# Patient Record
Sex: Male | Born: 1940 | Race: White | Hispanic: No | Smoking: Never smoker
Health system: Southern US, Community
[De-identification: ages and names within clinical notes are randomized; demographics above are authoritative.]

## PROBLEM LIST (undated history)

## (undated) DIAGNOSIS — Z789 Other specified health status: Secondary | ICD-10-CM

## (undated) DIAGNOSIS — K56609 Unspecified intestinal obstruction, unspecified as to partial versus complete obstruction: Secondary | ICD-10-CM

## (undated) DIAGNOSIS — IMO0002 Reserved for concepts with insufficient information to code with codable children: Secondary | ICD-10-CM

## (undated) DIAGNOSIS — G2581 Restless legs syndrome: Secondary | ICD-10-CM

## (undated) DIAGNOSIS — N179 Acute kidney failure, unspecified: Secondary | ICD-10-CM

## (undated) DIAGNOSIS — I480 Paroxysmal atrial fibrillation: Secondary | ICD-10-CM

## (undated) DIAGNOSIS — M4310 Spondylolisthesis, site unspecified: Secondary | ICD-10-CM

## (undated) DIAGNOSIS — Z9289 Personal history of other medical treatment: Secondary | ICD-10-CM

## (undated) DIAGNOSIS — E119 Type 2 diabetes mellitus without complications: Secondary | ICD-10-CM

## (undated) DIAGNOSIS — Z8673 Personal history of transient ischemic attack (TIA), and cerebral infarction without residual deficits: Secondary | ICD-10-CM

## (undated) DIAGNOSIS — R109 Unspecified abdominal pain: Secondary | ICD-10-CM

## (undated) DIAGNOSIS — N182 Chronic kidney disease, stage 2 (mild): Secondary | ICD-10-CM

## (undated) DIAGNOSIS — I10 Essential (primary) hypertension: Secondary | ICD-10-CM

## (undated) DIAGNOSIS — Z7901 Long term (current) use of anticoagulants: Secondary | ICD-10-CM

## (undated) DIAGNOSIS — M793 Panniculitis, unspecified: Secondary | ICD-10-CM

## (undated) DIAGNOSIS — I2510 Atherosclerotic heart disease of native coronary artery without angina pectoris: Secondary | ICD-10-CM

## (undated) DIAGNOSIS — E785 Hyperlipidemia, unspecified: Secondary | ICD-10-CM

## (undated) HISTORY — PX: SPINAL FUSION: SHX223

## (undated) HISTORY — DX: Unspecified intestinal obstruction, unspecified as to partial versus complete obstruction: K56.609

## (undated) HISTORY — DX: Reserved for concepts with insufficient information to code with codable children: IMO0002

## (undated) HISTORY — DX: Panniculitis, unspecified: M79.3

## (undated) HISTORY — PX: PROSTATE SURGERY: SHX751

## (undated) HISTORY — DX: Acute kidney failure, unspecified: N17.9

## (undated) HISTORY — DX: Atherosclerotic heart disease of native coronary artery without angina pectoris: I25.10

## (undated) HISTORY — DX: Hyperlipidemia, unspecified: E78.5

## (undated) HISTORY — DX: Spondylolisthesis, site unspecified: M43.10

## (undated) HISTORY — DX: Restless legs syndrome: G25.81

## (undated) HISTORY — DX: Essential (primary) hypertension: I10

## (undated) HISTORY — DX: Type 2 diabetes mellitus without complications: E11.9

---

## 2002-07-19 HISTORY — PX: CORONARY STENT PLACEMENT: SHX1402

## 2003-05-11 ENCOUNTER — Encounter: Payer: Self-pay | Admitting: Orthopedic Surgery

## 2003-05-11 ENCOUNTER — Inpatient Hospital Stay (HOSPITAL_COMMUNITY): Admission: EM | Admit: 2003-05-11 | Discharge: 2003-05-15 | Payer: Self-pay | Admitting: *Deleted

## 2004-08-28 ENCOUNTER — Ambulatory Visit: Payer: Self-pay | Admitting: *Deleted

## 2005-04-02 ENCOUNTER — Ambulatory Visit: Payer: Self-pay | Admitting: Cardiology

## 2005-09-16 ENCOUNTER — Ambulatory Visit: Payer: Self-pay | Admitting: Cardiology

## 2005-12-09 ENCOUNTER — Ambulatory Visit: Payer: Self-pay | Admitting: Cardiology

## 2006-01-11 ENCOUNTER — Encounter: Admission: RE | Admit: 2006-01-11 | Discharge: 2006-01-11 | Payer: Self-pay | Admitting: Orthopaedic Surgery

## 2006-02-11 ENCOUNTER — Ambulatory Visit: Payer: Self-pay | Admitting: Cardiology

## 2006-02-14 ENCOUNTER — Ambulatory Visit: Payer: Self-pay

## 2006-02-24 ENCOUNTER — Inpatient Hospital Stay (HOSPITAL_COMMUNITY): Admission: RE | Admit: 2006-02-24 | Discharge: 2006-02-28 | Payer: Self-pay | Admitting: Orthopaedic Surgery

## 2006-04-15 ENCOUNTER — Ambulatory Visit: Payer: Self-pay | Admitting: Cardiology

## 2006-06-21 ENCOUNTER — Ambulatory Visit: Payer: Self-pay | Admitting: Cardiology

## 2006-09-01 ENCOUNTER — Ambulatory Visit: Payer: Self-pay | Admitting: Cardiology

## 2006-09-01 LAB — CONVERTED CEMR LAB
ALT: 25 units/L (ref 0–40)
AST: 23 units/L (ref 0–37)
Alkaline Phosphatase: 58 units/L (ref 39–117)
Bilirubin, Direct: 0.2 mg/dL (ref 0.0–0.3)
HDL: 29.9 mg/dL — ABNORMAL LOW (ref >39.0)
LDL Cholesterol: 51 mg/dL (ref 0–99)
VLDL: 27 mg/dL (ref 0–40)

## 2006-10-10 ENCOUNTER — Ambulatory Visit: Payer: Self-pay | Admitting: Cardiology

## 2006-10-21 ENCOUNTER — Ambulatory Visit (HOSPITAL_BASED_OUTPATIENT_CLINIC_OR_DEPARTMENT_OTHER): Admission: RE | Admit: 2006-10-21 | Discharge: 2006-10-21 | Payer: Self-pay | Admitting: Urology

## 2007-03-27 ENCOUNTER — Ambulatory Visit: Payer: Self-pay | Admitting: Cardiology

## 2007-04-18 ENCOUNTER — Ambulatory Visit: Payer: Self-pay | Admitting: Cardiology

## 2007-04-18 LAB — CONVERTED CEMR LAB
ALT: 24 units/L (ref 0–53)
Albumin: 4.3 g/dL (ref 3.5–5.2)
Alkaline Phosphatase: 57 units/L (ref 39–117)
Total CHOL/HDL Ratio: 6.3
Triglycerides: 265 mg/dL (ref 0–149)
VLDL: 53 mg/dL — ABNORMAL HIGH (ref 0–40)

## 2007-09-28 ENCOUNTER — Ambulatory Visit: Payer: Self-pay | Admitting: Cardiology

## 2007-09-28 LAB — CONVERTED CEMR LAB
ALT: 26 units/L (ref 0–53)
Bilirubin, Direct: 0.3 mg/dL (ref 0.0–0.3)
Cholesterol: 161 mg/dL (ref 0–200)
HDL: 32.5 mg/dL — ABNORMAL LOW (ref >39.0)
LDL Cholesterol: 97 mg/dL (ref 0–99)
Total CHOL/HDL Ratio: 5
Total Protein: 7.2 g/dL (ref 6.0–8.3)
Triglycerides: 157 mg/dL — ABNORMAL HIGH (ref 0–149)
VLDL: 31 mg/dL (ref 0–40)

## 2007-10-03 ENCOUNTER — Ambulatory Visit: Payer: Self-pay

## 2009-09-08 DIAGNOSIS — Z9861 Coronary angioplasty status: Secondary | ICD-10-CM

## 2009-09-08 DIAGNOSIS — E1159 Type 2 diabetes mellitus with other circulatory complications: Secondary | ICD-10-CM | POA: Insufficient documentation

## 2009-09-08 DIAGNOSIS — I2510 Atherosclerotic heart disease of native coronary artery without angina pectoris: Secondary | ICD-10-CM | POA: Insufficient documentation

## 2009-09-09 ENCOUNTER — Ambulatory Visit: Payer: Self-pay | Admitting: Cardiology

## 2009-09-09 DIAGNOSIS — I739 Peripheral vascular disease, unspecified: Secondary | ICD-10-CM | POA: Insufficient documentation

## 2009-09-09 DIAGNOSIS — E7800 Pure hypercholesterolemia, unspecified: Secondary | ICD-10-CM | POA: Insufficient documentation

## 2009-09-09 DIAGNOSIS — I10 Essential (primary) hypertension: Secondary | ICD-10-CM | POA: Insufficient documentation

## 2009-09-26 ENCOUNTER — Ambulatory Visit: Payer: Self-pay | Admitting: Cardiology

## 2009-09-26 ENCOUNTER — Encounter: Payer: Self-pay | Admitting: Cardiology

## 2009-09-26 ENCOUNTER — Ambulatory Visit (HOSPITAL_COMMUNITY): Admission: RE | Admit: 2009-09-26 | Discharge: 2009-09-26 | Payer: Self-pay | Admitting: Cardiology

## 2009-09-26 ENCOUNTER — Ambulatory Visit: Payer: Self-pay

## 2009-10-07 ENCOUNTER — Ambulatory Visit: Payer: Self-pay | Admitting: Cardiology

## 2009-10-09 ENCOUNTER — Telehealth: Payer: Self-pay | Admitting: Cardiology

## 2009-10-21 ENCOUNTER — Encounter: Payer: Self-pay | Admitting: Cardiology

## 2009-10-22 ENCOUNTER — Telehealth: Payer: Self-pay | Admitting: Cardiology

## 2009-10-31 ENCOUNTER — Telehealth: Payer: Self-pay | Admitting: Cardiology

## 2010-02-02 DIAGNOSIS — E860 Dehydration: Secondary | ICD-10-CM | POA: Insufficient documentation

## 2010-04-13 ENCOUNTER — Ambulatory Visit: Payer: Self-pay | Admitting: Cardiology

## 2010-04-14 ENCOUNTER — Encounter: Payer: Self-pay | Admitting: Cardiology

## 2010-04-27 ENCOUNTER — Telehealth: Payer: Self-pay | Admitting: Cardiology

## 2010-05-11 ENCOUNTER — Telehealth: Payer: Self-pay | Admitting: Cardiology

## 2010-08-16 LAB — CONVERTED CEMR LAB
AST: 18 units/L (ref 0–37)
Albumin: 4.5 g/dL (ref 3.5–5.2)
Alkaline Phosphatase: 47 units/L (ref 39–117)
BUN: 17 mg/dL (ref 6–23)
Calcium: 9.4 mg/dL (ref 8.4–10.5)
GFR calc non Af Amer: 78.29 mL/min (ref >60)
Glucose, Bld: 118 mg/dL — ABNORMAL HIGH (ref 70–99)
HDL: 40.4 mg/dL (ref >39.00)
LDL Cholesterol: 90 mg/dL (ref 0–99)
Sodium: 142 meq/L (ref 135–145)
Total Bilirubin: 1.2 mg/dL (ref 0.3–1.2)
VLDL: 22 mg/dL (ref 0.0–40.0)

## 2010-08-18 NOTE — Progress Notes (Signed)
Summary: B/P readings-10-31-09 for Dr Shirlee Latch to review   Phone Note Outgoing Call   Call placed by: Katina Dung, RN, BSN,  October 31, 2009 9:33 AM Call placed to: Patient Summary of Call: check on B/P readings   Follow-up for Phone Call        LM for pt to call me to get recent B/P readings talked with patient--recent B/P readings  10-23-09 142/74   glucose148 10-24-09 150/71  glucose 138 10-25-09 124/67    glucose136 10-26-09 128/70   glucose 178 10-27-09 133/71   glucose 150 10-28-09 135/73   glucose 145 10-29-09 135/74   glucose 128 pt states he has not taken any Chlorthalidone(it made him sick after eating pizza,so he stopped it) or KCL since around first of April--he also is not taking Niapson,but will get OTC Niacin to try--will forward to Dr Shirlee Latch for review       Appended Document: B/P readings-10-31-09 for Dr Shirlee Latch to review BP actually looks ok.   Appended Document: B/P readings-10-31-09 for Dr Shirlee Latch to review LMVM for pt to call me  Appended Document: B/P readings-10-31-09 for Dr Shirlee Latch to review talked with patient by telephone

## 2010-08-18 NOTE — Progress Notes (Signed)
Summary: B/P readings   Phone Note Outgoing Call   Call placed by: Katina Dung, RN, BSN,  October 22, 2009 3:37 PM Call placed to: Patient Summary of Call: get B/P readings  Follow-up for Phone Call        [facility] for pt to call me--I need to  get B/P readings Chlorthalidone 12.5mg  and KCL started 10-07-09/BMP  10-21-09  Additional Follow-up for Phone Call Additional follow up Details #1::        3/23 153/80 3/24 174/82 3/25 172/81  3/26 151/81 3/27 172/83 3/28 154/82 3/29 133/72 3/30 141/74 3/31 150/70 4/1 143/79 4/2 143/79 4/3 141/71 4/4 143/71 4/5 137/71  3 days after starting meds blood sugar went up in the am 138-160 normally around 110s  [facility]  October 22, 2009 4:00 PM      Appended Document: B/P readings Gradually improving. . Would monitor for another 2 wks and check in with him again.   Appended Document: B/P readings discussed with patient--he is concerned about increased glucose since starting chlorthalidone--he will continue to monitor B/P and glucose and I will follow-up with him in 10-14 days

## 2010-08-18 NOTE — Assessment & Plan Note (Signed)
Summary: f72m/ gd  Medications Added ASPIRIN 81 MG TBEC (ASPIRIN) two tablets daily PRILOSEC OTC 20 MG TBEC (OMEPRAZOLE MAGNESIUM) 1 tab once daily TRAMADOL HCL 50 MG TABS (TRAMADOL HCL) one to two twice a day as needed for  pain GLUCOSAMINE-CHONDROITIN  CAPS (GLUCOSAMINE-CHONDROIT-VIT C-MN) 1 cap once daily NITROSTAT 0.4 MG SUBL (NITROGLYCERIN) 1 tablet under tongue at onset of chest pain; you may repeat every 5 minutes for up to 3 doses.      Allergies Added: NKDA  Visit Type:  6 MO F/U Primary Provider:  [name], PA  CC:  pt c/o heartburn qhs...says he has started to take prilosec qd .[name]..no other complaints today.  History of Present Illness: 70 yo with history of CAD s/p anterior MI in 2004 and LAD/RCA PCIs presents for cardiology followup.   Patient has been doing well.  No chest pain.  He works in his garden and weed-eats without shortness of breath.  He had been having some problems with "indigestion" (was getting burning in his epigastrium and periumbilical area when lying in bed at night).  After starting Prilosec over the counter, this seems to have mostly resolved.  He is taking Ibuprofen for aches/pains in his joints.  He had side effects with chlorthalidone so is no longer taking it.  He never started niacin.   Labs (3/11): LDL 90, HDL 40, K 4.4, creatinine 1.0 Labs (4/11): K 5.1, creatinine 1.4  ECG: NSR, 1st degree AV block, old anterior MI  Current Medications (verified): 1)  Metoprolol Tartrate 50 Mg Tabs (Metoprolol Tartrate) .... Take One Tablet Two Times A Day 2)  Lisinopril 40 Mg Tabs (Lisinopril) .... One Tablet Daily 3)  Glyburide-Metformin 5-500 Mg Tabs (Glyburide-Metformin) .... Take 2 Tablets Two Times A Day 4)  Lovastatin 20 Mg Tabs (Lovastatin) .... Take One Tablet Once Daily 5)  Bayer Aspirin 325 Mg Tabs (Aspirin) .... Take One Tablet Once Daily 6)  Allegra 180 Mg Tabs (Fexofenadine Hcl) .... Once Daily 7)  Prilosec Otc 20 Mg Tbec (Omeprazole  Magnesium) .[name].. 1 Tab Once Daily 8)  Ibuprofen 200 Mg Tabs (Ibuprofen) .[name].. 1 Tab Two Times A Day 9)  Glucosamine-Chondroitin  Caps (Glucosamine-Chondroit-Vit C-Mn) .[name].. 1 Cap Once Daily 10)  Nitrostat 0.4 Mg Subl (Nitroglycerin) .[name].. 1 Tablet Under Tongue At Onset of Chest Pain; You May Repeat Every 5 Minutes For Up To 3 Doses.  Allergies (verified): No Known Drug Allergies  Past History:  Past Medical History: 1. 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  s/p back surgery.  2. Diabetes mellitus 3. Hypertension: had side effects with chlorthalidone.  4. Hyperlipidemia: intolerant to multiple statins due to muscle pain.  He is able to tolerate lovastatin.  5. CAD: Anterior MI in 2004.  Patient had LAD and RCA PCI at the time.  Last myoview was in 7/07: EF 62%, small area of anteroapical infarct.  Echo (3/11): EF 60%, normal wall motion, mild MR, mild left atrial enlargement, mildly dilated ascending aorta.   6.  Arterial dopplers (3/11): no evidence for significant PAD.  7.  Allergic rhinitis  Family History: Reviewed history from 09/08/2009 and no changes required. Noncontributory  Social History: Reviewed history from 09/09/2009 and no changes required. 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  Tobacco Use - No.  Alcohol Use - no Drug Use - no  Review of Systems       All systems reviewed and negative except as per  HPI.   Vital Signs:  Patient profile:   70 year old male Height:      74 inches Weight:      224.12 pounds BMI:     28.88 Pulse rate:   53 / minute Pulse rhythm:   irregular BP sitting:   146 / 90  (left arm) Cuff size:   large  Vitals Entered By: Danielle Rankin, CMA (April 13, 2010 8:52 AM)  Physical Exam  General:  Well developed, well nourished, in no acute distress. Neck:  Neck supple, no JVD. No masses, thyromegaly or abnormal cervical nodes. Lungs:  Clear bilaterally to  auscultation and percussion. Heart:  Non-displaced PMI, chest non-tender; regular rate and rhythm, S1, S2 without murmurs, rubs. +S4. Carotid upstroke normal, no bruit.  Normal pedal pulses. No edema, no varicosities. Abdomen:  Bowel sounds positive; abdomen soft and non-tender without masses, organomegaly, or hernias noted. No hepatosplenomegaly. Extremities:  No clubbing or cyanosis. Neurologic:  Alert and oriented x 3. Psych:  Normal affect.   Impression & Recommendations:  Problem # 1:  CAD (ICD-414.00) Stable with no chest pain or significant dyspnea.  Most recent myoview in 7/07 showed a small area of anteroapical infarct with no ischemia.  Continue ASA, metoprolol, lisinopril, lovastatin.  EF is preserved.  OK to decrease ASA to 162 mg daily.   Problem # 2:  PURE HYPERCHOLESTEROLEMIA (ICD-272.0) I will have patient get lipids/LFTs checked.  Goal LDL < 70.  If above goal, would see if he can tolerate increasing lovastatin to 40 mg daily.   Problem # 3:  EPIGASTRIC SYMPTOMS Mostly resolved with Prilosec.  Would continue the Prilosec.  Given HTN, epigastric symptoms (? PUD), and CAD, would prefer that he not take ibuprofen.  I will have him try tramadol 50-100 mg two times a day as needed pain instead of ibuprofen.    Problem # 4:  UNSPECIFIED ESSENTIAL HYPERTENSION (ICD-401.9) BP high today, has been ok at home.  Will have him check BP for 2 wks and we will call to see what it is running.   Patient Instructions: 1)  Your physician has recommended you make the following change in your medication:  2)  Decrease Aspirin to 162mg  daily--this will be two 81mg  tablets daiy. 3)  Stop Ibuprofen. 4)  Use Tramadol 50mg  one to two twice a day as needed for pain instead of Ibuprofen. 5)  Take and record  your blood pressure--I will call you in 2 weeks to get the readings.  Check your blood pressure about 2 hours after you take your medication. 6)  Your physician recommends that you return for a  FASTING lipid profile/liver profile/BMP/CBC--you have the order. Please fax the results to [phone]. 7)  Your physician wants you to follow-up in: 6 months with Dr Shirlee Latch.  You will receive a reminder letter in the mail two months in advance. If you don't receive a letter, please call our office to schedule the follow-up appointment. Prescriptions: TRAMADOL HCL 50 MG TABS (TRAMADOL HCL) one to two twice a day as needed for  pain  #30 x 3   Entered by:   Katina Dung, RN, BSN   Authorized by:   Marca Ancona, MD   Signed by:   Katina Dung, RN, BSN on 04/13/2010   Method used:   Electronically to        [facility]  [address] [phone]* (retail)       [address]  [place], [place]  [address]       Ph: [phone] or [phone]       Fax: [phone]   RxID:   [id number]

## 2010-08-18 NOTE — Assessment & Plan Note (Signed)
Summary: ec6/Joseph [name]/jss  Medications Added METOPROLOL TARTRATE 50 MG TABS (METOPROLOL TARTRATE) take one tablet two times a day LISINOPRIL 40 MG TABS (LISINOPRIL) one tablet daily GLYBURIDE-METFORMIN 5-500 MG TABS (GLYBURIDE-METFORMIN) take one tablet two times a day LOVASTATIN 20 MG TABS (LOVASTATIN) take one tablet once daily BAYER ASPIRIN 325 MG TABS (ASPIRIN) take one tablet once daily      Allergies Added: NKDA  Primary Provider:  [name], PA  CC:  [place] [name].  [name] reports red rash on the right shoulder.  He is not sure if it is medication related or not.  [name] states he has had no other complaints or symptoms..  History of Present Illness: 70 yo with history of CAD s/p anterior MI in 2004 and LAD/RCA PCIs presents for cardiology followup.  He has been seen by Dr. Diona Browner and is seen by me for the first time today.  I have reviewed all his old records.  He has been doing reasonably well with no chest pain.  He does report pain in his bilateral calves and thighs after walking 100 feet on a mild incline.  This will go away with resting.  He does not get the leg pain on flat ground.  This has been going on for 4-5 years.  This is similar to his statin-induced muscle pain but that was at rest.  Patient gets mild exertional shortness of breath walking up a hill.  His BP is elevated to 164/96 today, he has taken his medications.    ECG: NSR, left axis deviation, old ASMI, 54 bpm.   Current Medications (verified): 1)  Metoprolol Tartrate 50 Mg Tabs (Metoprolol Tartrate) .... Take One Tablet Two Times A Day 2)  Lisinopril 40 Mg Tabs (Lisinopril) .... One Tablet Daily 3)  Glyburide-Metformin 5-500 Mg Tabs (Glyburide-Metformin) .... Take One Tablet Two Times A Day 4)  Lovastatin 20 Mg Tabs (Lovastatin) .... Take One Tablet Once Daily 5)  Bayer Aspirin 325 Mg Tabs (Aspirin) .... Take One Tablet Once Daily  Allergies (verified): No Known Drug Allergies  Past  History:  Past Medical History: 1. 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  s/p back surgery.  2. Diabetes mellitus 3. Hypertension 4. Hyperlipidemia: intolerant to multiple statins due to muscle pain.  He is able to tolerate lovastatin.  5. CAD: Anterior MI in 2004.  Patient had LAD and RCA PCI at the time.  Last myoview was in 7/07: EF 62%, small area of anteroapical infarct.   Family History: Reviewed history from 09/08/2009 and no changes required. 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  Tobacco Use - No.  Alcohol Use - no Drug Use - no  Review of Systems       All systems reviewed and negative except as per HPI.   Vital Signs:  Patient profile:   70 year old male Height:      74 inches Weight:      222 pounds BMI:     28.61 Pulse rate:   54 / minute Pulse rhythm:   regular BP sitting:   164 / 96  (left arm) Cuff size:   large  Vitals Entered By: Judithe Modest CMA (September 09, 2009 10:43 AM)  Physical Exam  General:  Well developed, well nourished, in no acute distress. Neck:  Neck supple, no JVD. No masses, thyromegaly or abnormal cervical nodes. Lungs:  Clear bilaterally to auscultation and  percussion. Heart:  Non-displaced PMI, chest non-tender; regular rate and rhythm, S1, S2 without murmurs, rubs. +S4. Carotid upstroke normal, no bruit.  1+ pulses right foot, 2+ pulses left foot. No edema, no varicosities. Abdomen:  Bowel sounds positive; abdomen soft and non-tender without masses, organomegaly, or hernias noted. No hepatosplenomegaly. Extremities:  No clubbing or cyanosis. Neurologic:  Alert and oriented x 3. Psych:  Normal affect.   Impression & Recommendations:  Problem # 1:  CAD (ICD-414.00) Stable with no chest pain.  He does have some mild exertional shortness of breath.  Most recent myoview in 7/07 showed a small area of anteroapical infarct with  no ischemia.  Continue ASA, metoprolol, lisinopril, lovastatin.  Will get echocardiogram to assess LV systolic function.    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  ? related to statin versus PAD.  Decreased pulses on right compared to left.  Will get arterial dopplers.    Problem # 3:  UNSPECIFIED ESSENTIAL HYPERTENSION (ICD-401.9) BP too high, increase lisinopril to 40 mg daily.  BMET 2 wks.   Problem # 4:  PURE HYPERCHOLESTEROLEMIA (ICD-272.0) Needs fasting lipids.    Followup 1 month after testing.   Other Orders: Echocardiogram (Echo) Arterial Duplex Lower Extremity (Arterial Duplex Low)  Patient Instructions: 1)  Your physician has recommended you make the following change in your medication:  2)  Increase Lisinopril to 40mg  daily 3)  Fasting lab in 10-14days at the time of the other testing--Lipid/lLiver profile/BMP  414.01 272.0 401.9 4)  Your physician has requested that you have a lower or upper extremity arterial duplex.  This test is an ultrasound of the arteries in the legs or arms.  It looks at arterial blood flow in the legs and arms.  Allow one hour for Lower and Upper Arterial scans. There are no restrictions or special instructions. 5)  Your physician has requested that you have an echocardiogram.  Echocardiography is a painless test that uses sound waves to create images of your heart. It provides your doctor with information about the size and shape of your heart and how well your heart's chambers and valves are working.  This procedure takes approximately one hour. There are no restrictions for this procedure. 6)  Your physician recommends that you schedule a follow-up appointment in: 1 month with Dr Marca Ancona Prescriptions: LISINOPRIL 40 MG TABS (LISINOPRIL) one tablet daily  #30 x 6   Entered by:   Katina Dung, RN, BSN   Authorized by:   Marca Ancona, MD   Signed by:   Katina Dung, RN, BSN on 09/09/2009   Method used:    [facility]  [address] [phone][facility] (retail[facility]       [address]       [place], [place]  [address]       Ph: [phone] or [phone]       Fax: [phone]   RxID:   [id number]

## 2010-08-18 NOTE — Progress Notes (Signed)
Summary: cost of niaspan is too high    Phone Note Call from Patient Call back at [facility] Phone [phone]   Caller: Patient Reason for Call: Talk to Nurse Summary of Call: The cost of NIASPAN 500   medication $ 47.00, was told by Dr. Holland Falling that he could get it for $4 .00. has question  Initial call taken by: Lorne Skeens,  October 09, 2009 3:45 PM  Follow-up for Phone Call        Spoke with pt. regarding medication Niapan 500 mg is $47.00 instead of $  4.00 as the MD said. Pt. states he will not take Niaspan. He will  change his diet to see if that helps. I let pt. know will send this message to MD's desktop. Ollen Gross, RN, BSN  October 09, 2009 4:35 PM      Appended Document: cost of niaspan is too high  Should be able to get regular niacin for $4.  Please check for the patient.   Appended Document: cost of niaspan is too high  discussed with Dr Wilmon Pali for pt to try Niacin 500mg  OTC--LMVM for pt to call me  Appended Document: cost of niaspan is too high  discussed with patient by telephone--he will try Niacin 500mg  OTC   Clinical Lists Changes  Medications: Changed medication from NIASPAN 500 MG CR-TABS (NIACIN (ANTIHYPERLIPIDEMIC)) one at bedtime for 1 week then increase to two at bedtime--take Aspirin 30 minutes before taking Niaspan to NIACIN 500 MG TABS (NIACIN) one tablet  at bedtime      Appended Document: cost of niaspan is too high  pt gave me recent B/P readings--10/13/09 154/82;   10/14/09 133/72;   10/15/09 141/74

## 2010-08-18 NOTE — Progress Notes (Signed)
Summary: pt rtn your call   Phone Note Call from Patient Call back at Home Phone [phone]   Caller: Patient Reason for Call: Talk to Nurse, Talk to Doctor Summary of Call: pt rtn your call Initial call taken by: Omer Jack,  May 11, 2010 3:12 PM  Follow-up for Phone Call        I talked with pt

## 2010-08-18 NOTE — Assessment & Plan Note (Signed)
Summary: 1 month rov echo/lea done 09-26-09  Medications Added GLYBURIDE-METFORMIN 5-500 MG TABS (GLYBURIDE-METFORMIN) take 2 tablets two times a day ALLEGRA 180 MG TABS (FEXOFENADINE HCL) once daily CHLORTHALIDONE 25 MG TABS (CHLORTHALIDONE) one-half tablet daily POTASSIUM CHLORIDE CR 10 MEQ CR-CAPS (POTASSIUM CHLORIDE) Take one tablet by mouth daily NIASPAN 500 MG CR-TABS (NIACIN (ANTIHYPERLIPIDEMIC)) one at bedtime for 1 week then increase to two at bedtime--take Aspirin 30 minutes before taking Niaspan      Allergies Added: NKDA  Visit Type:  Follow-up Primary Provider:  [name], [place]   History of Present Illness: 70 yo with history of CAD s/p anterior MI in 2004 and LAD/RCA PCIs presents for cardiology followup.   He has been doing reasonably well with no chest pain.  He does report pain in his bilateral calves and thighs after walking 100 feet on a hill.  This will go away with resting.  He does not get the leg pain on flat ground.  This has been going on for 4-5 years.  Arterial dopplers were done and showed no evidence for significant PAD.  Patient gets mild exertional shortness of breath walking up a hill.  His BP is still elevated today despite increasing lisinopril at last appointment.  Echo was done showing preserved LV systolic function and mild mitral regurgitation.    Labs (3/11): LDL 90, HDL 40, K 4.4, creatinine 1.0  Current Medications (verified): 1)  Metoprolol Tartrate 50 Mg Tabs (Metoprolol Tartrate) .... Take One Tablet Two Times A Day 2)  Lisinopril 40 Mg Tabs (Lisinopril) .... One Tablet Daily 3)  Glyburide-Metformin 5-500 Mg Tabs (Glyburide-Metformin) .... Take 2 Tablets Two Times A Day 4)  Lovastatin 20 Mg Tabs (Lovastatin) .... Take One Tablet Once Daily 5)  Bayer Aspirin 325 Mg Tabs (Aspirin) .... Take One Tablet Once Daily 6)  Allegra 180 Mg Tabs (Fexofenadine Hcl) .... Once Daily  Allergies (verified): No Known Drug Allergies  Past History:  Past  Medical History: 1. 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  s/p back surgery.  2. Diabetes mellitus 3. Hypertension 4. Hyperlipidemia: intolerant to multiple statins due to muscle pain.  He is able to tolerate lovastatin.  5. CAD: Anterior MI in 2004.  Patient had LAD and RCA PCI at the time.  Last myoview was in 7/07: EF 62%, small area of anteroapical infarct.  Echo (3/11): EF 60%, normal wall motion, mild MR, mild left atrial enlargement, mildly dilated ascending aorta.   6.  Arterial dopplers (3/11): no evidence for significant PAD.  7.  Allergic rhinitis  Family History: Reviewed history from 09/08/2009 and no changes required. Noncontributory  Social History: Reviewed history from 09/09/2009 and no changes required. 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  Tobacco Use - No.  Alcohol Use - no Drug Use - no  Vital Signs:  Patient profile:   70 year old male Height:      74 inches Weight:      219 pounds BMI:     28.22 Pulse rate:   60 / minute BP sitting:   160 / 80  (left arm)  Vitals Entered By: Laurance Flatten CMA (October 07, 2009 8:57 AM)  Physical Exam  General:  Well developed, well nourished, in no acute distress. Neck:  Neck supple, no JVD. No masses, thyromegaly or abnormal cervical nodes. Lungs:  Clear bilaterally to auscultation and percussion. Heart:  Non-displaced PMI, chest non-tender; regular rate and rhythm,  S1, S2 without murmurs, rubs. +S4. Carotid upstroke normal, no bruit.  Normal pedal pulses. No edema, no varicosities. Abdomen:  Bowel sounds positive; abdomen soft and non-tender without masses, organomegaly, or hernias noted. No hepatosplenomegaly. Extremities:  No clubbing or cyanosis. Neurologic:  Alert and oriented x 3. Psych:  Normal affect.   Impression & Recommendations:  Problem # 1:  CAD (ICD-414.00) Stable with no chest pain.  He does have some mild exertional shortness  of breath.  Most recent myoview in 7/07 showed a small area of anteroapical infarct with no ischemia.  Continue ASA, metoprolol, lisinopril, lovastatin.  Echocardiogram this month showed preserved LV systolic function.   Problem # 2:  UNSPECIFIED ESSENTIAL HYPERTENSION (ICD-401.9) BP is still elevated.  I will have him start chlorthalidone 12.5 mg daily with KCl 10 mEq daily.  BMET in 2 wks.    Problem # 3:  PURE HYPERCHOLESTEROLEMIA (ICD-272.0) LDL is higher than goal (<70).  Given his occasional leg pain that may be statin-related, do not want to increase lovastatin.  Instead, will have him try Niaspan.  Start 500 mg at bedtime 30 minutes after ASA is taken.  Increase Niaspan to 1000 mg daily after 1 week if tolerated.  Lipids/LFTs in 2 months.   Patient Instructions: 1)  Your physician has recommended you make the following change in your medication:  2)  Start Chlorthalidone 12.5mg  daily--this will be one-half of a 25mg  tablet 3)  Start KCL(potassium) daily 4)  Start Niaspan 500mg  at bedtime for 1 week then increase to 1000mg  at bedtime -- take Aspirin 30 minutes before you take Niaspan--eat a low fat snack when you take Niaspan 5)  Take and record your blood pressure--I will call you in 2 weeks to get the readings 6)  Your physician recommends that you return for lab work in: 2weeks---BMP-- you have the order-please fax results to [phone] 7)  Your physician recommends that you return for a FASTING lipid profile/liver profile in 3 months 8)  Your physician wants you to follow-up in: 6 months with Dr Shirlee Latch.  You will receive a reminder letter in the mail two months in advance. If you don't receive a letter, please call our office to schedule the follow-up appointment. Prescriptions: NIASPAN 500 MG CR-TABS (NIACIN (ANTIHYPERLIPIDEMIC)) one at bedtime for 1 week then increase to two at bedtime--take Aspirin 30 minutes before taking Niaspan  #60 x 6   Entered by:   Katina Dung, RN,  BSN   Authorized by:   Marca Ancona, MD   Signed by:   Katina Dung, RN, BSN on 10/07/2009   Method used:   [facility]  [address] [phone][facility] ([facility]ail)       [address]       [place], [place]  [address]       Ph: [phone] or [phone]       Fax: [phone]   RxID:   [id number] POTASSIUM CHLORIDE CR 10 MEQ CR-CAPS (POTASSIUM CHLORIDE) Take one tablet by mouth daily  #30 x 6   Entered by:   Katina Dung, RN, BSN   Authorized by:   Marca Ancona, MD   Signed by:   Katina Dung, RN, BSN on 10/07/2009   Method used:   [facility]  [address] [phone]* (retail)       [address]  [place], [place]  [address]       Ph: [phone] or [phone]       Fax: [phone]   RxID:   [id number] CHLORTHALIDONE 25 MG TABS (CHLORTHALIDONE) one-half tablet daily  #15 x 6   Entered by:   Katina Dung, RN, BSN   Authorized by:   Marca Ancona, MD   Signed by:   Katina Dung, RN, BSN on 10/07/2009   Method used:   [facility]  [address] [phone][facility] ([facility]ail)       [address]       [place], [place]  [address]       Ph: [phone] or [phone]       Fax: [phone]   RxID:   [id number]

## 2010-08-18 NOTE — Progress Notes (Signed)
Summary: B/P readings 04/27/10   Phone Note Outgoing Call   Call placed by: Katina Dung, RN, BSN,  April 27, 2010 11:09 AM Call placed to: Patient Summary of Call: B/P readings  Follow-up for Phone Call        [facility] to get B/P readings Anne Lankford,RN  [facility] Katina Dung, RN, BSN  April 30, 2010 6:22 PM   Additional Follow-up for Phone Call Additional follow up Details #1::        per pt calling back to speak with anne. advise him that Thurston Hole is off today.was told to call in with b/p reading.  10/15- 151/80  10/19 -156/81 10/20- 179/81 taken at 1:30 PM today. PH# [phone] Lorne Skeens  May 07, 2010 1:54 PM     Pt. states called today to give B/P reading that MD wanted for him to have done for a week, for possible change of B/P medication. Pt states took  B/P mediction about 08:00 AM today. Pt. has no c/o at this time. Additional Follow-up by: Ollen Gross, RN, BSN,  May 07, 2010 2:08 PM     Appended Document: B/P readings 04/27/10 BP high, start amlodipine 5 mg daily.   Appended Document: B/P readings 04/27/10 LMTCB   Appended Document: B/P readings 04/27/10 I talked with pt--he agreed to start Amlodipine 5mg  daily   Clinical Lists Changes  Medications: Added new medication of AMLODIPINE BESYLATE 5 MG TABS (AMLODIPINE BESYLATE) one daily - Signed Rx of AMLODIPINE BESYLATE 5 MG TABS (AMLODIPINE BESYLATE) one daily;  #30 x 6;  Signed;  Entered by: Katina Dung, RN, BSN;  Authorized by: Marca Ancona, MD;  Method used: Electronically to [facility]  [address] [phone]*, [address] [place], [place], [place]  [address], Ph: [phone] or [phone], Fax: [phone] Observations: Added new observation of MEDRECON: current updated (05/11/2010 15:41)    Prescriptions: AMLODIPINE BESYLATE 5 MG TABS (AMLODIPINE BESYLATE) one daily  #30 x 6   Entered by:   Katina Dung, RN, BSN   Authorized by:   Marca Ancona, MD   Signed by:   Katina Dung, RN, BSN on  05/11/2010   Method used:   [facility]        [facility]  [address] [phone]* (retail)       [address]       [place], [place]  [address]       Ph: [phone] or [phone]       Fax: [phone]   RxID:   [id number]     Current Medications (verified): 1)  Metoprolol Tartrate 50 Mg Tabs (Metoprolol Tartrate) .... Take One Tablet Two Times A Day 2)  Lisinopril 40 Mg Tabs (Lisinopril) .... One Tablet Daily 3)  Glyburide-Metformin 5-500 Mg Tabs (Glyburide-Metformin) .... Take 2 Tablets Two Times A Day 4)  Lovastatin 20 Mg Tabs (Lovastatin) .... Take One Tablet Once Daily 5)  Aspirin 81 Mg Tbec (Aspirin) .... Two Tablets Daily 6)  Allegra 180 Mg Tabs (Fexofenadine Hcl) .... Once Daily 7)  Prilosec Otc 20 Mg Tbec (Omeprazole Magnesium) .[name].. 1 Tab Once Daily 8)  Tramadol Hcl 50 Mg Tabs (Tramadol Hcl) .... One To Two Twice A Day As Needed For  Pain 9)  Glucosamine-Chondroitin  Caps (Glucosamine-Chondroit-Vit C-Mn) .[name].. 1 Cap Once Daily 10)  Nitrostat 0.4 Mg Subl (Nitroglycerin) .[name].. 1 Tablet Under Tongue At Onset of Chest Pain; You May Repeat Every 5 Minutes For Up To 3  Doses. 11)  Amlodipine Besylate 5 Mg Tabs (Amlodipine Besylate) .... One Daily  Allergies: No Known Drug Allergies

## 2010-09-28 ENCOUNTER — Encounter: Payer: Self-pay | Admitting: Cardiology

## 2010-10-13 ENCOUNTER — Telehealth: Payer: Self-pay | Admitting: Cardiology

## 2010-10-13 ENCOUNTER — Ambulatory Visit (INDEPENDENT_AMBULATORY_CARE_PROVIDER_SITE_OTHER): Payer: Medicare Other | Admitting: Cardiology

## 2010-10-13 ENCOUNTER — Encounter: Payer: Self-pay | Admitting: Cardiology

## 2010-10-13 DIAGNOSIS — Z79899 Other long term (current) drug therapy: Secondary | ICD-10-CM

## 2010-10-13 DIAGNOSIS — E7800 Pure hypercholesterolemia, unspecified: Secondary | ICD-10-CM

## 2010-10-13 DIAGNOSIS — I10 Essential (primary) hypertension: Secondary | ICD-10-CM

## 2010-10-13 DIAGNOSIS — I2510 Atherosclerotic heart disease of native coronary artery without angina pectoris: Secondary | ICD-10-CM

## 2010-10-13 DIAGNOSIS — R109 Unspecified abdominal pain: Secondary | ICD-10-CM | POA: Insufficient documentation

## 2010-10-13 DIAGNOSIS — M25559 Pain in unspecified hip: Secondary | ICD-10-CM | POA: Insufficient documentation

## 2010-10-13 LAB — BASIC METABOLIC PANEL
CO2: 29 mEq/L (ref 19–32)
Chloride: 107 mEq/L (ref 96–112)
Creatinine, Ser: 1.1 mg/dL (ref 0.4–1.5)

## 2010-10-13 LAB — LIPID PANEL
LDL Cholesterol: 74 mg/dL (ref 0–99)
Total CHOL/HDL Ratio: 5

## 2010-10-13 LAB — HEPATIC FUNCTION PANEL
Albumin: 4.3 g/dL (ref 3.5–5.2)
Alkaline Phosphatase: 46 U/L (ref 39–117)
Bilirubin, Direct: 0.2 mg/dL (ref 0.0–0.3)

## 2010-10-13 NOTE — Assessment & Plan Note (Signed)
Chronic abdominal discomfort with belching, uncertain etiology.  PPI has not helped much.  He may have abdominal discomfort due to metformin.  I have asked him to contact his PCP regarding stopping metformin and using a different medication.

## 2010-10-13 NOTE — Assessment & Plan Note (Signed)
Patient gets hip pain radiating into his thighs with exertion.  He has a history of lumbar spine arthritis.  I suspect that this pain is due to L-spine or hip joint arthritis.  He has good peripheral pulses so think PAD is less likely.  I have asked him to try taking tramadol before he goes walking to see if this helps.

## 2010-10-13 NOTE — Assessment & Plan Note (Signed)
BP high today but has not taken meds today.  BP has been good at home.  I will have him check his BP daily and record for 2 weeks (after taking meds).  We will call him in 2 wks to see what BP is running.

## 2010-10-13 NOTE — Assessment & Plan Note (Signed)
Stable with no chest pain.  Continue statin, ASA, lisinopril, and metoprolol.

## 2010-10-13 NOTE — Progress Notes (Signed)
70 yo with history of CAD s/p anterior MI in 2004 and LAD/RCA PCIs presents for cardiology followup.   Patient has not had any chest pain.  He has had chronic abdominal discomfort associated with belching.  He has been taking a PPI without much relief.  He had an EGD in [place] that was unrevealing per his report.  He also has been getting pain in his hips bilaterally radiating into his thighs with exertion.  This occurs especially when he goes up an incline.  He has a history of back pain and lumbar spine degenerative disease.  Patient is able to walk on flat ground without dyspnea.  He is mildly short of breath with walking up hills.  BP has been running 130s/70s at home when he checks it.  BP today is 170/90, but he has not taken his meds today.    Labs (3/11): LDL 90, HDL 40, K 4.4, creatinine 1.0 Labs (4/11): K 5.1, creatinine 1.4 Labs (9/11): K 4.5, creatinine 0.97, LDL 83, HDL 35  ECG: NSR, old ASMI  Past Medical History: 1. 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  s/p back surgery.  2. Diabetes mellitus 3. Hypertension: had side effects with chlorthalidone.  4. Hyperlipidemia: intolerant to multiple statins due to muscle pain.  He is able to tolerate lovastatin.  5. CAD: Anterior MI in 2004.  Patient had LAD and RCA PCI at the time.  Last myoview was in 7/07: EF 62%, small area of anteroapical infarct.  Echo (3/11): EF 60%, normal wall motion, mild MR, mild left atrial enlargement, mildly dilated ascending aorta.   6.  Arterial dopplers (3/11): no evidence for significant PAD.  7.  Allergic rhinitis 8.  Abdominal discomfort: EGD 2011 showed no significant abnormalities 9.  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  Tobacco Use - No.  Alcohol Use - no Drug Use - no  Review of Systems        All systems reviewed and negative except as per HPI.    Current Outpatient Prescriptions  Medication Sig Dispense Refill  . amLODipine (NORVASC) 5 MG tablet Take 5 mg by mouth daily.        [name] aspirin 81 MG tablet Take 162 mg by mouth daily.        . fexofenadine (ALLEGRA) 180 MG tablet Take 180 mg by mouth daily.        [name] glyBURIDE-metformin (GLUCOVANCE) 5-500 MG per tablet Take 2 tablets by mouth 2 (two) times daily with a meal.        . lisinopril (PRINIVIL,ZESTRIL) 40 MG tablet Take 40 mg by mouth daily.        [name] lovastatin (MEVACOR) 20 MG tablet Take 20 mg by mouth at bedtime.        . metoprolol (LOPRESSOR) 50 MG tablet Take 25 mg by mouth 2 (two) times daily.       . nitroGLYCERIN (NITROSTAT) 0.4 MG SL tablet Place 0.4 mg under the tongue every 5 (five) minutes as needed.        [name] omeprazole (PRILOSEC OTC) 20 MG tablet Take 20 mg by mouth 2 (two) times daily.       . traMADol (ULTRAM) 50 MG tablet Take 50 mg by mouth 2 (two) times daily as needed.        [name] DISCONTD: glucosamine-chondroitin 500-400 MG tablet Take 1 tablet by mouth daily.  BP 170/90  Pulse 58  Ht 6\' 2"  (1.88 m)  Wt 215 lb 8 oz (97.75 kg)  BMI 27.67 kg/m2 General: NAD Neck: No JVD, no thyromegaly or thyroid nodule.  Lungs: Clear to auscultation bilaterally with normal respiratory effort. CV: Nondisplaced PMI.  Heart regular S1/S2, no S3, no murmur.  +S4.  No peripheral edema.  No carotid bruit.  Normal pedal pulses.  Abdomen: Soft, nontender, no hepatosplenomegaly, no distention.  Neurologic: Alert and oriented x 3.  Psych: Normal affect. Extremities: No clubbing or cyanosis.

## 2010-10-13 NOTE — Assessment & Plan Note (Signed)
Patient is due for lipids.  Will check today, goal LDL < 70.

## 2010-10-13 NOTE — Patient Instructions (Signed)
Please call your Primary Care Physician to switch from Metformin to another drug.  Also call our office to report your BP readings.

## 2010-10-13 NOTE — Telephone Encounter (Signed)
Pt states nurse was going to call in rx after his dr visit today.

## 2010-10-14 MED ORDER — LOVASTATIN 20 MG PO TABS: 20.0000 mg | ORAL_TABLET | Freq: Every day | ORAL | Status: DC

## 2010-10-14 NOTE — Telephone Encounter (Signed)
Returned Patient's call from yesterday-needs Lovastatin 20mg  [facility] [place]  Will ERx prescription now.

## 2010-10-26 ENCOUNTER — Telehealth: Payer: Self-pay | Admitting: *Deleted

## 2010-10-26 NOTE — Telephone Encounter (Signed)
I talked with pt. Recent BP readings. 10/17/10 132/72   10/18/10 134/71    10/19/10 131/75   10/20/10 124/73   10/21/10 161/66 (this was about 9:30 pm-- pt states he was very tired and just got home)    10/22/10 133/73    10/24/10 140/79 --I will forward to Dr Shirlee Latch for review

## 2010-10-26 NOTE — Telephone Encounter (Signed)
Good BP, no change in meds

## 2010-10-26 NOTE — Telephone Encounter (Signed)
Discussed with pt by telephone.

## 2010-11-29 ENCOUNTER — Other Ambulatory Visit: Payer: Self-pay | Admitting: Cardiology

## 2010-12-01 NOTE — Assessment & Plan Note (Signed)
[facility]                            CARDIOLOGY OFFICE NOTE   LEILAN, BOCHENEK                       MRN:          [id number]  DATE:03/27/2007                            DOB:          1941-07-01    PRIMARY CARE PHYSICIAN:  Dr. Durward Parcel.   REASON FOR VISIT:  Cardiac followup.   HISTORY OF PRESENT ILLNESS:  Mr. Fosco is doing reasonably well.  He  denies any significant problems with angina or limiting dyspnea on  exertion.  He states that he ran out of his simvastatin and decided to  hold it for several weeks.  He reports that some of his prior leg cramps  improved.  He also started taking a high dose cinnamon supplement  product for his cholesterol.  He is interested in seeing how his  cholesterol status is now prior to considering any other Statin therapy.  His electrocardiogram today shows sinus bradycardia with a leftward  axis, decreased anterior R wave progression as noted previously.   ALLERGIES:  No known drug allergies.   PRESENT MEDICATIONS:  1. Aspirin 325 mg p.o. daily.  2. Lopressor 25 mg p.o. q.i.d.  3. Lisinopril 20 mg p.o. daily.  4. Glimepiride 4 mg p.o. b.i.d.  5. Cinnamon 1000 mg p.o. b.i.d.  6. Nitroglycerin p.r.n.   REVIEW OF SYSTEMS:  As described in the history of present illness.   EXAMINATION:  Blood pressure 126/78, heart rate is 54, weight is 228  pounds.  The patient is comfortable in no acute distress.  Examination of the neck reveals no elevated jugular venous pressure or  loud bruits.  No thyromegaly is noted.  LUNGS:  Clear with unlabored breathing at rest.  CARDIAC:  Reveals a regular rate and rhythm without loud murmur or  gallop.  ABDOMEN:  Soft and nontender.  EXTREMITIES:  No pitting edema.   IMPRESSION/RECOMMENDATIONS:  1. Coronary artery disease status post previous anterior wall      myocardial infarction in 2004 with subsequent stent placement to      the left anterior descending and right  coronary artery.  His last      Myoview in July 2007 demonstrated no active ischemia with anterior      apical scar.  We will plan to continue medical therapy.  I did      discuss with him followup liver and lipid testing over the next 4      weeks for reassessment of his cholesterol control.  We may well      need to reinstitute lower dose Statin therapy, or perhaps another      Statin preparation.  He would like to see how his cholesterol fares      off of simvastatin on his high-dose cinnamon      supplements.  I will otherwise plan to see him back over the next 6      months.  2. Further plan is to follow.     Jonelle Sidle, MD  Electronically Signed    SGM/MedQ  DD: 03/27/2007  DT: 03/28/2007  Job #: [id number]  cc:   Domenica Fail
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
## 2010-12-04 NOTE — Op Note (Signed)
Joseph [name], Joseph [name]                ACCOUNT NO.:  [id number]   MEDICAL RECORD NO.:  [id number]          PATIENT TYPE:  INP   LOCATION:  2550                         FACILITY:  [facility]   PHYSICIAN:  Sharolyn Douglas, M.D.        DATE OF BIRTH:  Sep 20, 1940   DATE OF PROCEDURE:  02/24/2006  DATE OF DISCHARGE:                                 OPERATIVE REPORT   PREOPERATIVE DIAGNOSES:  1. Lumbar spinal stenosis.  2. Lumbar degenerative spondylolisthesis, L4-5.  3. Severe degenerative disk disease, L5-S1  4. Foraminal narrowing, L5-S1.   POSTOPERATIVE DIAGNOSES:  1. Lumbar spinal stenosis.  2. Lumbar degenerative spondylolisthesis, L4-5.  3. Severe degenerative disk disease, L5-S1  4. Foraminal narrowing, L5-S1.   PROCEDURE:  1. Lumbar laminectomy L3-4, L4-5 and L5-S1 with wide decompression of the      thecal sac and nerve roots bilaterally.  2. Posterior spinal arthrodesis L4 through S1.  3. Segmental pedicle screw instrumentation L4 through S1 using Abbott      spine system.  4. Transforaminal lumbar interbody fusion at L4-5 and L5-S1 with placement      of two PEEK cages.  5. Local autogenous bone graft.   SURGEON:  Sharolyn Douglas, M.D.   ASSISTANT:  Verlin Fester, P.A.   ANESTHESIA:  General endotracheal   COMPLICATIONS:  None.   NEEDLE AND SPONGE COUNT:  Correct.   INDICATIONS FOR PROCEDURE:  The patient is a pleasant, 70 year old man with  progressively worsening back and leg pain.  He has failed to respond to  conservative treatment.  His plain radiographs and MRI scan shows severe  degenerative changes with spinal stenosis at L3-4, L4-5, and L5-S1.  He has  a spondylolisthesis at L4-5 and severe foraminal narrowing at L5-S1 due to  disk space narrowing.  He now presents for a lumbar decompression and fusion  in hopes of improving his symptoms.  The risks and benefits were reviewed.   DESCRIPTION OF PROCEDURE:  The patient was identified in the holding area  and taken to the  operating room. He underwent general endotracheal  anesthesia without difficulty and given prophylactic IV antibiotics.  He was  carefully turned prone onto the Wilson frame, all bony prominences padded,  face and eyes protected at all times.  The back was prepped and draped in  the usual sterile fashion.  Neuro monitoring had been established in the  form of SSEPs and lower extremity EMGs.  A midline incision was made from L72  down to the through the sacrum.  Dissection was carried sharply to the deep  fascia.  Subperiosteal exposure carried out in the tips of the transverse  processes of L4, L5 and the sacral ala bilaterally.  The dissection was  tedious due to the enlarged facette joints and deep muscle.  Deep retractors  were placed and intraoperative x-ray was taken to confirm the levels.  We  then turned our attention to completing a wide lumbar laminectomy by  removing the entire spinous process and lamina of L4 and L5.  The inferior  1/3 of the L3 spinous process  was removed to allow undercutting of the  ligamentum flavum up to the level of the L3-4 disk space.  The high-speed  bur was used along with Kerrison punches.  Again the lamina and facette  joints were hypertrophied.  There was severe ligamentum flavum, hypertrophy  with severe spinal stenosis at L4-5 less involved at L3-4 and L5-S1.  At L4-  5 on the left side, the facette joint was adherent to the underlying dura  presumably from an old cyst.  This was carefully dissected free using loupes  and headlight magnification.  We completed foraminotomies identifying the  L4, L5 and the S1 nerve roots bilaterally.  We then turned our attention to  placing pedicle screws at L4, L5 and S1 bilaterally using an anatomic  probing technique.  Each pedicle hole was initiated using the awl followed  by the __________ pedicle probe.  The pedicle holes were palpated from  within the pedicle hole and also the spinal canal and there were no   breeches.  Each hole was tapped and then the appropriate screw placed.  We  utilized 6.5 x 45 mm screws in L4 and L5 and 7.5 x 3 mm screws in the sacrum  bilaterally.  The bone quality was good and the screw purchase was  excellent.  Each screw was stimulated using triggered EMGs and there were no  deleterious changes.  We then turned our attention to performing  transforaminal lumbar interbody fusions on the left side at L4-5 and L5-S1.  This was done to address the spondylolisthesis at the L4-5 level and also to  indirectly decompress the foramen at L5-S1.  The remaining facette joints of  L4-5 and L5-S1 were osteotomized.  The exiting and transversing nerve roots  were identified and protected at all times.  Free running EMGs were  monitored.  At L4-5, we identified a large disk rupture which was partially  calcified and deflecting the L5 nerve root posteriorly.  The disk space was  entered and a radical diskectomy was carried out across the contralateral  side.  The cartilaginous endplates were scraped clean.  The disk space was  then irrigated and packed with local bone graft obtained with the  laminectomy.  An 11 mm PEEK cage was then packed with local bone graft  inserted into the interspace at L4-5, tamped anteriorly across the midline.  We then performed a similar procedure at L5-S1.  Again a radical diskectomy  was completed, the cartilaginous endplates were scraped clean.  The disk  space was irrigated, packed with local bone graft and then a 7 mm PEEK cage  was inserted into the interspace, tamped anteriorly and across the midline.  We had excellent distraction.  We reevaluated the L5 nerve roots within the  L5-S1 foramen and found that they were now completely free.  The wound was  irrigated.  The posterior arthrodesis was completed by decorticating the  transverse processes of L4-L5 and S1 bilaterally.  The remaining local bone graft was packed into the lateral gutters.  80 mm  titanium rods were bent  into lordosis and placed into the polyaxial screw heads.  Gentle compression  was applied across each segment before shearing off the locking caps.  A  cross connector was placed.  Hemostasis was achieved.  Gelfoam left over the  exposed epidural space.  Deep hemovac drain left, the deep fascia closed  with a running #1 Vicryl suture, subcutaneous layer closed with interrupted  2-0 Vicryl followed by a running  3-0 subcuticular Vicryl suture on the skin  edges.  Benzoin and Steri-Strips placed, sterile dressing applied.  The  patient was turned supine, extubated without difficulty and transferred to  recovery in stable condition.  It should be noted that my assistant, [facility], PA, was present throughout the procedure including during the  positioning, during the exposure, during the decompression, the fusion, and  the instrumentation.  She also assisted with the closure.      Sharolyn Douglas, M.D.  Electronically Signed     MC/MEDQ  D:  02/24/2006  T:  02/24/2006  Job:  [id number]

## 2010-12-04 NOTE — H&P (Signed)
Joseph [name], CARMEAN NO.:  [id number]   MEDICAL RECORD NO.:  [id number]          PATIENT TYPE:  INP   LOCATION:  5008                         FACILITY:  [facility]   PHYSICIAN:  Sharolyn Douglas, M.D.        DATE OF BIRTH:  26-Mar-1941   DATE OF ADMISSION:  02/24/2006  DATE OF DISCHARGE:  02/28/2006                                HISTORY & PHYSICAL   CHIEF COMPLAINT:  Low back pain.   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  The best course of management because  of his failure to improve and continued pain was L4 to S1 posterior spinal  fusion and decompression.  The risks and benefits of this were discussed  with the patient by Dr. Noel Gerold.  He indicated understanding and opted to  proceed.   ALLERGIES:  None.   MEDICATIONS:  Amaryl, Lipitor, aspirin, metoprolol and lisinopril.   PAST MEDICAL HISTORY:  1. MI in 2004.  2. Coronary artery disease.   PAST SURGICAL HISTORY:  1. Cardiac stents x2 in 2004.  2. Prostate surgery.   SOCIAL HISTORY:  The patient is divorced.  Denies tobacco or alcohol use.   FAMILY HISTORY:  Noncontributory.   REVIEW OF SYSTEMS:  Negative.   PHYSICAL EXAMINATION:  VITAL SIGNS:  Pulse is 60 and regular, respirations  16 and unlabored, blood pressure 120/70.  GENERAL APPEARANCE:  A 70 year old white male who is alert and oriented, in  no acute distress.  He is well-nourished, well-groomed, appears his stated  age, is pleasant and cooperative with the exam.  HEENT:  Head is normocephalic, atraumatic.  Pupils equal, round, and  reactive to light.  Extraocular movements intact.  Nose patent.  Pharynx  clear.  NECK:  Soft to palpation, no lymphadenopathy, thyromegaly or bruits  appreciated.  CHEST:  Clear to auscultation bilaterally.  No rales, rhonchi, stridor,  wheezes or friction rubs.  BREASTS:  Not pertinent, not performed.  CARDIAC:  S1, S2, regular rate and rhythm, no murmurs, gallops or rubs  noted.  ABDOMEN:  Soft to palpation, nontender, nondistended, no organomegaly noted.  Positive bowel sounds throughout.  GENITOURINARY:  Not pertinent, not performed.  EXTREMITIES:  As per HPI.  SKIN:  Intact without any lesions or rashes.   X-RAY:  An MRI that showed degenerative disk disease, L4 to S1, with  spondylolisthesis.   IMPRESSION:  1. 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  2. Coronary artery disease.  3. History of myocardial infarction with cardiac stents in 2004.   PLAN:  Admit to [facility] on February 24, 2006, for L4 to S1  posterior spinal fusion and laminectomy.  This will be done by Dr. Noel Gerold.      Verlin Fester, P.A.      Sharolyn Douglas, M.D.  Electronically Signed    CM/MEDQ  D:  03/09/2006  T:  03/09/2006  Job:  [id number]

## 2010-12-04 NOTE — Cardiovascular Report (Signed)
NAME:  Joseph [name], Joseph [name]                          ACCOUNT NO.:  [id number]   MEDICAL RECORD NO.:  [id number]                   PATIENT TYPE:  INP   LOCATION:  2922                                 FACILITY:  [facility]   PHYSICIAN:  Carole Binning, M.D. [facility]         DATE OF BIRTH:  [birth year]   DATE OF PROCEDURE:  05/11/2003  DATE OF DISCHARGE:                              CARDIAC CATHETERIZATION   PROCEDURES PERFORMED:  1. Left heart catheterization with coronary angiography and left     ventriculography.  2. Percutaneous transluminal coronary angioplasty with stent placement     utilizing a drug-eluting stent in the mid left anterior descending     artery.   CARDIOLOGIST:  Carole Binning, M.D.   INDICATIONS:  Joseph [name] is a 70 year old male with a history of diabetes  mellitus.  He presented to [facility] with chest pain and ST segment  elevation in the anterior precordial leads.  He had a ventricular  fibrillation cardiac arrest while being evaluated from which he was  successfully resuscitated.  He was intubated after his arrest and then  transferred emergently to [facility].  On arrival he was awake and  breathing on his own.  He was thus extubated and then he was brought to the  cardiac catheterization laboratory for emergent catheterization.   CATHETERIZATION PROCEDURAL NOTE:  A 6 French sheath was placed in the right  femoral artery.  Coronary angiography was performed using 6 [place] JL-4 and  JR-4 catheters.  Left ventriculography was performed with an angled pigtail  catheter.   Contrast was Omnipaque.   COMPLICATIONS:  There were no complications.   RESULTS:   HEMODYNAMIC DATA:  Left ventricular pressure 156/25.  Aortic pressure  156/88.  There is no aortic valve gradient.   VENTRICULOGRAPHIC DATA:  Left Ventriculogram:  There is mild akinesis of the  apical wall otherwise normal wall motion.  Ejection fraction estimated at  55%.  There is no  mitral regurgitation.   ARTERIOGRAPHIC DATA:  Coronary Arteriography (Codominant)  Left main is normal.   Left anterior descending artery has a diffuse 20% stenosis in the proximal  vessel.  In the mid LAD just beyond a large first diagonal branch there is a  discrete 30% stenosis followed by 100% occlusion.  There is thrombus  associated with this occlusion and TIMI 0 flow beyond it.  The LAD gives  rise to a single large diagonal branch arising from the mid LAD, which has a  long 80% stenosis in the proximal portion of the diagonal branch.   The left circumflex is a codominant vessel.  It gives rise to a large first  marginal branch, which has a 40% stenosis at its origin and a 20% stenosis  in the body.  There is a small second marginal branch and a normal-sized  third marginal branch.  The third marginal branch has a 20% stenosis in the  midbody.  The distal circumflex has a diffuse 20% stenosis followed by a 70-  80% just beyond the first posterolateral branch.  Beyond this there is a 50%  stenosis prior to the second posterolateral branch.  The distal right  coronary artery gives rise to a small first posterolateral branch and a  large second posterolateral branch.   The right coronary artery is a codominant vessel.  There is a diffuse 20%  stenosis in the midvessel followed by an 80% stenosis in the distal portion  of the midvessel at the acute margin.  Beyond this in the distal vessel  there is a diffuse 20% stenosis.  In the AV groove portion of the right  coronary artery distal to the posterior descending artery there is a diffuse  50% stenosis.  The posterior descending artery itself is a very large  vessel, which supplies the inferoapical wall. There is a tubular 70-80%  stenosis in the midportion of the posterior descending artery.  The right  coronary artery then gives rise to three small posterolateral branches.  The  first posterolateral branch has a long 90% stenosis  in the midbody.   IMPRESSION:  1. Mildly decreased left ventricular systolic function with mild akinesis of     the apical wall.  2. Three-vessel coronary artery disease; the culprit is 100% occlusion of     the mid left anterior descending.   PLAN:  Percutaneous intervention of the LAD; see below.   PERCUTANEOUS TRANSLUMINATION CORONARY ANGIOPLASTY PROCEDURAL NOTE:  Following completion of diagnostic catheterization we proceeded directly to  percutaneous coronary intervention.  Heparin and Integrilin were  administered per protocol.  We used a 7 [place] JL-4 guiding catheter.  A Hi-  Torque floppy wire was advanced under fluoroscopic guidance successfully  beyond the occlusion in the LAD and positioned in the distal vessel.  This  established partial reperfusion of the LAD.  We then advanced a Hi-Torque  floppy wire into the diagonal branch for side branch protection. We then  performed PTCA of the mid LAD with a 2.0 x 15 Quantum balloon inflated to  14, 14 and 12 atmospheres throughout the length of the diseased vessel.  There was significant residual disease and therefore we carefully positioned  a 2.5 x 20 mm TAXUS drug-eluting stent in the mid LAD at the site of the  original occlusion and deployed the stent at 9 atmospheres.  We then pulled  our stent delivery balloon back slight and inflated it to 11 atmospheres and  then pulled it back slightly further and inflated it at two 16 atmospheres.  Following this we went in with a 2.5 x 15 mm Quantum balloon positioning  this in the distal aspect of the stent inflating it to 14 atmospheres.  We  then pulled this balloon back into the mid aspect and inflated it to 18  atmospheres.  We then went in with a 3.0 x 12 mm Quantum balloon positioning  it in the proximal aspect of the stent, inflating it to 20 atmospheres.  We then advanced the balloon forward into the mid stent and inflated it to 10  atmospheres.  Finally we went in with a  3.25 x 8 mm Quantum balloon in the  proximal portion of the stent and inflated it to 18 atmospheres.   Intermittent doses of verapamil and nitroglycerin were administered to  improve antegrade perfusion.  Final angiographic images were then obtained  revealing patency of the LAD with 0% residual stenosis at  the stent site and  TIMI III flow.   COMPLICATIONS:  None.   RESULTS:  Successful percutaneous transluminal coronary angioplasty with  placement of a drug-eluting stent in the mid left anterior descending  artery.  A 100% occlusion with thrombus with TIMI 0 flow was reduced to a 0%  residual TIMI III flow.    PLAN:  Integrilin will be continued for 24 hours.  It is recommended the  patient be treated with Plavix for six to ninth months.  We will further  review the cines to determine further treatment.  The options include staged  multivessel intervention of the residual disease in the right coronary  artery as well as possibly the diagonal branch and the distal left  circumflex in the four to six weeks versus consideration of coronary bypass  surgery.                                                 Carole Binning, M.D. [facility]    MWP/MEDQ  D:  05/11/2003  T:  05/12/2003  Job:  [id number]   cc:   Cardiac Cathererization Laboratory

## 2010-12-04 NOTE — Discharge Summary (Signed)
NAME:  Joseph [name], Joseph [name]                          ACCOUNT NO.:  [id number]   MEDICAL RECORD NO.:  [id number]                   PATIENT TYPE:  INP   LOCATION:  6529                                 FACILITY:  [facility]   PHYSICIAN:  Carole Binning, M.D. [facility]         DATE OF BIRTH:  10-20-1940   DATE OF PROCEDURE:  DATE OF DISCHARGE:  05/15/2003                                 DISCHARGE SUMMARY   DISCHARGE DIAGNOSES:  1. Status post acute anterior ST elevation myocardial infarction.     A. Treated with Taxus stent to the mid left anterior descending.     B. Residual coronary artery disease, 70-80% distal right coronary artery,        70% distal circumflex, and 70% diagonal.     C. Status post Taxus stent to the distal right coronary artery on May 14, 2003.     D. Ejection fraction 55% with slight apical hypokinesis.  2. Dyslipidemia.  3. Diabetes mellitus.  4. History of prostate cancer.  5. Back pain.  6. Abnormal urinalysis, urine culture pending at the time of this dictation.   PROCEDURE PERFORMED:  1. Emergent cardiac catheterization and percutaneous coronary intervention     by Dr. Gerri Spore on May 11, 2003:  Taxus stent to the mid LAD.  2. Recatheterization and staged intervention of the RCA by Dr. Charlies Constable     on May 14, 2003:  Taxus stent to the RCA.   HOSPITAL COURSE:  Please see the dictated admission history and physical  from Dr. Moishe Spice on May 11, 2003 for complete details.   Briefly, this 70 year old male presented to the [facility] emergency  room with 30 minutes of chest pain on May 11, 2003.  He experienced  ventricular fibrillation arrest and was defibrillated x1 and intubated.  His  EKG revealed anterior ST elevation and peaked T waves.  He was transferred  to [facility]  for further treatment.   He was brought emergently to the cardiac catheterization lab by Dr.  Gerri Spore.  While there he underwent Taxus  stent placement to the mid LAD.  His angiogram revealed diffuse disease with residual stenoses of 70-80%  distal RCA, 80% diagonal and 70-80% distal circumflex.  The patient was  placed on Integrilin and Plavix.  He was successfully extubated.  Dr. Juanda Chance  reviewed the films with Dr. Gerri Spore.  They had decided to plan on PCI of  the RCA and relook catheterization of the diagonal distal circumflex.  This  was performed on May 14, 2003.  Angiography revealed less than 10%  stenosis at the stent in the LAD and 70% diagonal stenosis.  The circumflex  had 70% distal stenosis and the RCA had 70% distal stenosis.  Dr. Juanda Chance  proceeded with Taxus stent placement to the RCA, reducing stenosis from 70%  to 10%.  Left ventriculogram revealed and EF  of 55% with a slight apical  hypokinesis.   On the morning of May 15, 2003 the patient was found to be in stable  condition.  He would need to remain on Plavix for at least six to nine  months.  Zocor was started this admission as well as Lopressor.  The patient  would need to continue taking his Amaryl for this diabetes.  He would need  followup lipids and LFTs in about six to eight weeks.  He would need with  Dr. Gerri Spore in the next two weeks and then possible referral to [facility]  Cardiology.  It is recommended that the patient not drive for one week.   During this admission, the patient did have an abnormal urinalysis with  positive blood and leukocytes and yeast noted on the microscopic.  The  repeat urinalysis also revealed some blood and leukocytes and at the time of  this dictation, urine cultures were pending.  If urine cultures are  positive, the patient will be contacted and appropriate antibiotics will be  started.   Dr. Juanda Chance saw the patient on October 27 and felt he was ready for discharge  to home in stable condition.   LABORATORY DATA:  At discharge, white count 9600, hemoglobin 12.8,  hematocrit 35.6, platelet count  210,000, INR 1.  Sodium 136, potassium 3.9,  chloride 103, CO2 28, glucose 164, BUN 18, creatinine 1.2, calcium 8.7.  Upon admission, his total bilirubin was 0.8, alk phos 53, AST 83, ALT 41,  total protein 6.7, albumin 3.7.  Hemoglobin A1C 6.5.  Peak troponin-I 22.34,  peak CK-MB 165.7.  Total cholesterol 166, triglycerides 265, HDL 31, LDL 82,  VLDL 53, TSH 0.876.  Urinalysis from October 25, greater than 1000 mg of  __________  glucose, small blood, large leukocytes, rare squamous epithelial  cells, 21-50 white blood cells, 7-10 red blood cells, no bacteria seen.  Urine culture pending.  Chest x-ray from October 23, endotracheal tube tip  thoracic inlet 6 cm above the carina, tortuous aorta, left base subsegmental  atelectasis.   DISCHARGE MEDICATIONS:  1. Aspirin 325 mg daily.  2. Plavix 75 mg daily for at least six to nine months.  3. Zocor 40 mg.  4. Lopressor 25 mg twice daily.  5. Amaryl as before.  6. Nitroglycerin p.r.n. chest pain.  7. For pain management, Tylenol as needed.   FOLLOW UP:  He is to call our office in [place] or 911 recurrent chest  pain.   ACTIVITY:  No driving for one week.  No work, heavy lifting, exertion, or  sex until seen by Dr. Gerri Spore in followup.   DIET:  Low fat, low sodium, diabetic diet.   WOUND CARE:  The patient is to call our office in [facility] for any groin  swelling , bleeding or bruising.   SPECIAL INSTRUCTIONS:  The patient was enrolled in the [employer] study.  Follow  up with the research staff.  This was set up for October 28 to include  Sestamibi scan, lab work, EKG and 2-D echocardiogram.   FOLLOW UP:  1. Dr. Gerri Spore will see the patient on November 11 at 12:30 p.m.  2. The patient is to set up an appointment with the __________  clinic in     followup in the next one to two weeks and he should call for an     appointment.  3.    The patient needs followup LFTs and lipid panel in six to eight weeks.  4. As noted above,  the patient's urine culture is still pending.  He will be     contacted if his urine culture is positive for infection.     Tereso Newcomer, P.A.                        Carole Binning, M.D. [facility]    SW/MEDQ  D:  05/15/2003  T:  05/15/2003  Job:  [id number]

## 2010-12-04 NOTE — Op Note (Signed)
[name], Joseph [name]                ACCOUNT NO.:  [id number]   MEDICAL RECORD NO.:  [id number]          PATIENT TYPE:  AMB   LOCATION:  NESC                         FACILITY:  [facility]   PHYSICIAN:  Ronald L. Earlene Plater, M.D.  DATE OF BIRTH:  19-Nov-1940   DATE OF PROCEDURE:  10/21/2006  DATE OF DISCHARGE:                               OPERATIVE REPORT   PROCEDURE:  Retrograde pyelogram.   Utilizing the ureteroscope, a left retrograde pyelogram was performed  after the stone and been removed.  No extravasation was noted.  The  inner collecting system appeared to be essentially normal and there were  no filling defects.      Ronald L. Earlene Plater, M.D.  Electronically Signed     RLD/MEDQ  D:  10/21/2006  T:  10/21/2006  Job:  [id number]

## 2010-12-04 NOTE — H&P (Signed)
NAME:  RAYCE, BRAHMBHATT                          ACCOUNT NO.:  [id number]   MEDICAL RECORD NO.:  [id number]                   PATIENT TYPE:  INP   LOCATION:  2922                                 FACILITY:  [facility]   PHYSICIAN:  Carole Binning, M.D. [facility]         DATE OF BIRTH:  [birth year]   DATE OF ADMISSION:  05/11/2003  DATE OF DISCHARGE:                                HISTORY & PHYSICAL   CHIEF COMPLAINT:  Chest pain.   HISTORY OF PRESENT ILLNESS:  The patient is a pleasant 70 year old man who  presented to [facility] Emergency Room with a history of 30 minutes of chest  pain.  The patient arrived at the [facility] ER at 1608 and was given  sublingual nitroglycerin, and his chest pain decreased to 5/10.  He was  however, diaphoretic, pale, and clammy.  He received morphine, aspirin,  Lopressor, nitroglycerin drip and at 1630 he arrested with ventricular  fibrillation.  He was defibrillated x 1 with 200 joules, and the patient was  intubated, paralyzed with succinylcholine and etomidate and placed on IV  heparin.  His EKGs showed anterior ST elevation with peaked T waves, and the  patient was transferred to [facility] for further evaluation.  On arrival at  [facility], the patient was intubated but was responding to questions.  He  initially did not complain of chest pain but this waxed and waned with 1-2  out of 10 chest pain.   PAST MEDICAL HISTORY:  1. Diabetes mellitus.  2. History of prostate cancer.  3. History of back pain.   ALLERGIES:  No known drug allergies.   MEDICATIONS:  Unknown, however, by report the patient takes one oral  hypoglycemic medication.   SOCIAL HISTORY:  [place].  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  He does not smoke, does  not drink.  There is no history of drug abuse.   FAMILY HISTORY:  Unknown.   REVIEW OF SYSTEMS:  All other review of systems are unknown since the  patient is  intubated.   PHYSICAL EXAMINATION:  VITAL SIGNS:  Temperature is afebrile, blood pressure  is 151/85, respirations are 16, pulse is 84.  He is sating 99% on 50% FIO2.  GENERAL:  He is a healthy-appearing man who is intubated and alert.  HEENT:  Normocephalic, atraumatic.  PERRLA.  EOMI.  Mucous membranes are  moist.  NECK:  No JVD.  No bruits.  Lymphadenopathy none.  CARDIOVASCULAR:  Regular, rate and rhythm.  No murmurs, rubs or gallops.  PMI is normal placed.  LUNGS:  Clear to auscultation bilaterally anteriorly.  SKIN:  No lesions.  ABDOMEN:  Soft, nontender, nondistended.  Normal bowel sounds.  No  hepatosplenomegaly.  EXTREMITIES:  No cyanosis, clubbing or edema.  He has 2+ pulses in his  distal lower extremities.  NEUROLOGIC:  Intact.   Chest  x-ray is pending.   EKG shows persistent anterior ST elevation with a rate of 82 and QRS  duration of 94.   LABS:  White blood count 10, hematocrit 46, hemoglobin 16.5, platelets 337.  Sodium 139, potassium 3.7, chloride 103, bicarb 23, BUN 20, creatinine 1.1,  glucose 231.  AST 42, ALT 38, alk phos 90, total protein 7.2, albumin 4.5.  CK is 29, initial troponin is 0.  PTT is 26.6, INR is 1.0.  BNP is 12.6.  ABG, drawn at [facility], is 7.27, 49, 71.   The patient is a 70 year old man with no cardiac history but he does have a  history of diabetes who presents with an anterior acute MI with a witnessed  VF arrest at [facility] requiring emergent defibrillation.  The patient was  transferred to [facility] for further evaluation.   ASSESSMENT/PLAN:  1. Acute myocardial infarction.  The patient arrived in the coronary care     unit and complained of a 2/10 chest pain.  He was subsequently extubated     and EKGs continued to show anterior ST elevation with ST segment     elevation in V1-V3.  The patient was taken to the cath lab for emergent     PCI, after being loaded on Integrilin and also given heparin, aspirin and     a beta-blocker.   We will also begin a statin and an ACE inhibitor.     Further plans will be directed by Dr. Loraine Leriche Pulsipher.      Heloise Beecham, M.D. [facility]                Carole Binning, M.D. [facility]    DWM/MEDQ  D:  05/11/2003  T:  05/11/2003  Job:  [id number]

## 2010-12-04 NOTE — Op Note (Signed)
Joseph [name], Joseph [name]                ACCOUNT NO.:  [id number]   MEDICAL RECORD NO.:  [id number]          PATIENT TYPE:  AMB   LOCATION:  [facility]                         FACILITY:  [facility]   PHYSICIAN:  Ronald L. Earlene Plater, M.D.  DATE OF BIRTH:  February 10, 1941   DATE OF PROCEDURE:  10/21/2006  DATE OF DISCHARGE:                               OPERATIVE REPORT   DIAGNOSIS:  Large left distal ureteral stone.   OPERATIVE PROCEDURE:  Cystourethroscopy, left ureteroscopy, left  retrograde pyelogram, holmium laser lithotripsy with basket stone  extraction, and placement of left double-J stent.   SURGEON:  Lucrezia Starch. Earlene Plater, M.D.   ANESTHESIA:  LMA.   ESTIMATED BLOOD LOSS:  Negligible.   TUBES:  26 cm, 7-French contoured double pigtail stent with a string.   COMPLICATIONS:  None.   INDICATIONS FOR PROCEDURE:  Mr. 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  CT  scan revealed a large left distal ureteral calculus, although he was  asymptomatic from it.  Cystourethroscopy revealed no lesions in the  bladder.  He also has some intrarenal calculi.  After understanding  risks, benefits and alternatives, he elected to proceed with the above  procedure.   PROCEDURE IN DETAIL:  The patient was placed in supine position, after  proper LMA anesthesia was placed in the dorsal lithotomy position and  prepped and draped with Betadine in a sterile fashion.  Cystourethroscopy was performed with a 22.5 French Olympus panendoscope  utilizing the 12 and 70-degree lenses.  The bladder was carefully  inspected and noted be without lesion.  A 0.038 French sensor wire was  placed into the left renal pelvis and the distal ureter was dilated with  the dilating sheath of a short ureteral access catheter.  Ureteroscopy  was then performed with the short, thin ureteroscope.  The stone was  noted to be at the level the vessels on the left side, was noted to be  quite large and quite hard.  Utilizing the 365 micron laser fiber at a  setting of 0.5 and a repetition rate of 5, the stone was fragmented into  multiple small fragments.  It was quite voluminous and quite difficult  to do.  Large fragments were basket-extracted with a nitinol basket and  a large fragment was in the distal ureter.  It appeared to lodge and the  laser fiber was used to break it up into small fragments and, again,  each fragment was serially extracted.  Inspection of the lower two-  thirds ureter revealed there were no perforations or other significant  stones.  Dye was injected retrograde through the ureteroscope.  The  collecting system appeared to be essentially normal without  extravasation or filling defects.  The scope was removed and under  fluoroscopic guidance a 26-cm 7-French  contoured double pigtail stent was placed and noted be in good position  within the left renal pelvis and within the bladder.  All stones will be  submitted to  pathology.  The bladder was drained.  The panendoscope was  removed.  A pullout string was attached to the penis.  The patient was  taken to the recovery room stable.      Ronald L. Earlene Plater, M.D.  Electronically Signed     RLD/MEDQ  D:  10/21/2006  T:  10/21/2006  Job:  [id number]

## 2010-12-04 NOTE — Discharge Summary (Signed)
Joseph [name], Joseph [name]                ACCOUNT NO.:  [id number]   MEDICAL RECORD NO.:  [id number]          PATIENT TYPE:  INP   LOCATION:  5008                         FACILITY:  [facility]   PHYSICIAN:  Sharolyn Douglas, M.D.        DATE OF BIRTH:  1941-04-21   DATE OF ADMISSION:  02/24/2006  DATE OF DISCHARGE:  02/28/2006                                 DISCHARGE SUMMARY   ADMITTING DIAGNOSES:  1. L4-S1 spinal stenosis of spondylodesis.  2. Diabetes mellitus.  3. Diabetes.  4. Hypertension.  5. History of myocardial infarction in 2004.   DISCHARGE DIAGNOSES:  1. Status post L4-S1 posterior spinal fusion, doing well.  2. Postoperative blood loss anemia.  3. Postoperative hyperglycemia.  4. Postoperative hyponatremia.   PROCEDURE:  On February 24, 2006, the patient was taken to the operating room,  L4-S1 posterior spinal fusion with Pedicle screws and __________.   SURGEON:  Sharolyn Douglas, M.D.   ASSISTANT:  Jill Side Mahar, PA-C.   ANESTHESIA:  General.   CONSULTS:  None.   LABS:  CBC with diff preop was normal.  Postoperatively H&H was monitored,  reached a level of 11.0 and 31.2 on February 27, 2006 and did not require any  treatment.  PT/INR and PTT preop normal.  Complete metabolic panel preop was  normal and postoperatively basic metabolic panel did show an elevated  glucose range from 126 to 179, sodium was decreased on February 26, 2006 at  133, calcium down at 8.1.  Otherwise, basic metabolic panel was normal.  UA  from preop was negative.  Blood type was O positive.  Antibody screen was  negative.  Urine culture from August 7, showed no growth.  Intraoperative x-  rays on February 24, 2006, show L4-S1 fusion.  Hardware in good position.  August 12, satisfactory postoperatively appearance of L4-S1 effusion.  No  EKG seen on the chart.   BRIEF HISTORY:  Patient is a 70 year old male who has had a longstanding  problems with his back and has failed to improve with conservative  treatment.   His pain had gotten to the point that it was severe and  affecting his activities of daily living and extending into his legs.  He  did not respond to conservative treatment with any lasting or relief of his  pain.  It was felt his best course of management was L4-S1 decompression and  fusion.  Risks and benefits of the procedure were discussed with the patient  by Dr. Noel Gerold.  He indicated understanding and opts to proceed.   HOSPITAL COURSE:  On February 24, 2006, the patient was admitted to the  hospital and taken to the operating room and received the above procedure.  He tolerated the procedure well without any intraoperative complications.  He was transferred to the recovery room in stable condition.   Postoperatively routine orthopedic spine protocol was followed and he  progressed along relatively well.   Physical therapy and occupational therapy did work with him on a daily basis  on a progressive ambulation program, brace use and back precautions and he  progressed along well.   Medically, he remained stable throughout his hospital course.   By February 28, 2006, patient had met all orthopedic goals.  He was  independent and safe with his brace use and back precautions.  He was also  medically stable and ready for discharge home.   DISCHARGE PLAN:  Patient is a 70 year old male, status post L4-S1 posterior  spinal fusion, doing well.   ACTIVITY:  Daily ambulation program.  Brace on when he is up.  Daily  dressing change.  No lifting heavier than 5 pounds.  Back precautions at all  times.   FOLLOWUP:  Two weeks postoperatively with Dr. Noel Gerold.   MEDICATIONS:  Vicodin and Robaxin as needed, multivitamin daily, calcium  daily, Colace twice daily and laxative as needed.   DIET:  Recommend a diabetic, carbohydrate modified diet.   CONDITION ON DISCHARGE:  Stable and improved.   DISPOSITION:  Patient being discharged to his home with his family  assistance, as well as home  health, physical therapy and occupational  therapy.      Verlin Fester, P.A.      Sharolyn Douglas, M.D.  Electronically Signed    CM/MEDQ  D:  04/20/2006  T:  04/20/2006  Job:  [id number]

## 2010-12-04 NOTE — Cardiovascular Report (Signed)
NAME:  PILOT, PRINDLE NO.:  [id number]   MEDICAL RECORD NO.:  [id number]                   PATIENT TYPE:  INP   LOCATION:  4714                                 FACILITY:  [facility]   PHYSICIAN:  Charlies Constable, M.D.                  DATE OF BIRTH:  1941/01/16   DATE OF PROCEDURE:  05/14/2003  DATE OF DISCHARGE:                              CARDIAC CATHETERIZATION   CLINICAL HISTORY:  Mr. Ginger is 70 years old and was recently seen in  [facility] Emergency room with an acute anterior wall infarction.  He was  transferred to [place] and underwent stenting of the proximal LAD by Carole Binning, M.D. [facility].  His overall LV function was fairly well preserved with  an ejection fraction of about 45%.  He had residual disease in the diagonal  branch of the LAD, distal circumflex artery, and mid to distal right  coronary artery and we brought him back today for a relook with plans for  intervention on the right coronary artery.   PROCEDURE:  The procedure was performed via the left femoral artery using  arterial sheath and 6-French preformed coronary catheters.  After taking  pictures of the left coronary artery and left ventriculogram we made  preparation to intervene on the right coronary artery.  We used a JR4 6-  [place] guiding catheter with side holes and a short PT2 light support wire.  We crossed the lesion with the wire without difficulty.  We direct stented  with a 3.5 x 12 mm Taxus stent deploying this with one inflation of 16  atmospheres for 35 seconds.  We then pulse dilated with a 4.0 x 9 mm  Maverick balloon performing two inflations up to 18 atmospheres for 30  seconds.  We then post dilated with a 4.5 x 12 mm Quantum Maverick  performing one inflation up to 10 atmospheres for 30 seconds.  Repeat  diagnostic study was then performed through the guiding catheter.  The  patient tolerated the procedure well and left the laboratory in satisfactory  condition.   RESULTS:  Left main coronary artery:  Free of significant disease.   Left anterior descending artery:  Gave rise to a diagonal branch and septal  perforator.  There was 70% narrowing in the first diagonal branch.  There  was less than 10% narrowing at the stent site in the proximal LAD after the  diagonal branch.  There was moderately heavy calcification in the proximal  LAD.   Circumflex artery:  Gave rise to a ramus branch, a marginal branch, and a  small enlarged posterolateral branch.  There was 70% narrowing in the distal  circumflex artery after the small posterolateral branch.   Right coronary artery:  Moderate sized vessel.  Gave rise to small right  ventricular branch, a large right ventricular branch, a posterior descending  branch, and a small  posterolateral branch.  There was 70% narrowing in the  mid and distal right coronary artery before the second right ventricular  branch.   LEFT VENTRICULOGRAM:  The left ventriculogram performed in the RAO  projection showed slight hypokinesis of the apex.  The rest of the wall  motion was quite good and the estimated ejection fraction was 55%.   Following stenting of the lesion in the mid to distal right coronary artery,  the stenosis improved from 70% to 10%.   CONCLUSIONS:  1. A recent anterior wall myocardial infarction treated with stenting of the     proximal left anterior descending with less than 10% narrowing at the     stent site in the proximal left anterior descending, 70% narrowing in the     first diagonal branch, 70% narrowing in the distal circumflex artery, and     70% narrowing in the mid to distal right coronary artery with slight     apical wall hypokinesis.  2. Successful stenting of the lesion in the mid to distal right coronary     artery with improvement in center of narrowing from 70% to 10% using a     Taxus stent.   DISPOSITION:  The patient returned to postanesthesia for further   observation.  The lesions in the distal circumflex and diagonal do not  appear quite tight enough to warrant intervention and will plan medical  therapy.                                               Charlies Constable, M.D.    BB/MEDQ  D:  05/14/2003  T:  05/14/2003  Job:  [id number]   cc:   [facility]   Carole Binning, M.D. [facility]   CP Lab

## 2010-12-04 NOTE — Assessment & Plan Note (Signed)
[facility]                              CARDIOLOGY OFFICE NOTE   Joseph [name], Joseph [name]                       MRN:          [id number]  DATE:04/15/2006                            DOB:          04-17-1941    PRIMARY CARE PHYSICIAN:  Durward Parcel, MD   REASON FOR VISIT:  Routine cardiac follow-up.   HISTORY OF PRESENT ILLNESS:  Mr. Joseph [name] returns after undergoing his lumbar  disc surgery back in 08/07.  He did well from a cardiac perspective, and his  preoperative myocardial perfusion study did not reveal any evidence of  ischemia with an ejection fraction of 62% and small area of scar in the  anterior apical distribution.  Electrocardiogram today showed sinus  bradycardia at 50 beats per minute with prolonged PR interval.  He is not  having any more leg discomfort at this time, and there was some question as  to whether this was related to Statin therapy or his back problem.  Total CK  level was normal.  We talked about reinstituting low-dose Simvastatin, and  if he tolerates it, recheck a lipid profile and liver function tests over  the next 12 weeks.   ALLERGIES:  No known drug allergies.   PRESENT MEDICATIONS:  Aspirin 325 mg p.o. q. day, Lopressor 25 mg p.o.  b.i.d., lisinopril 20 mg p.o. q. day, glimepiride 4 mg p.o. q. day,  nitroglycerin 0.4 mg sublingual p.r.n. (has not used).   REVIEW OF SYSTEMS:  As in history of present illness.  He is wearing a back  brace and is beginning arrangements for rehabilitation.  He anticipates 3-6  months until he is fully recovered.   PHYSICAL EXAMINATION:  VITAL SIGNS:  Blood pressure is 122/72.  Heart rate  is 50.  Weight is 233 pounds.  GENERAL:  The patient is in no acute distress.  NECK:  Supple without elevated jugular venous pressure or loud bruits.  LUNGS:  Clear without labored breathing.  CARDIAC:  Exam reveals a regular rate and rhythm without loud murmur or S3  gallop.  EXTREMITIES:  Show  pitting edema.   IMPRESSION AND RECOMMENDATIONS:  1. Coronary artery disease, status post previous anterior wall myocardial      infarction in 10/04 with subsequent stent placement of the left      anterior descending and ultimately the mid and distal right coronary      artery.  He is doing well without angina at this time and recent      ischemic testing showed no clear evidence of ischemia with an overall      preserved ejection fraction of 62%.  Plan to continue medical regimen      and see him back for symptom review over the next 6 months.  2. Hyperlipidemia.  He has been off Statin therapy given prior complaints      of leg pain.  He did not have an elevated CK level.  He is no longer      experiencing discomfort status post surgery, and there is some question  as to whether Statin therapy was related in the first      place.  We will rechallenge with Simvastatin 20 mg daily, and if he      tolerates this plan a follow-up lipid profile and liver function tests      over the next 12 weeks.       Jonelle Sidle, MD     SGM/MedQ  DD:  04/15/2006  DT:  04/17/2006  Job #:  [id number]

## 2010-12-04 NOTE — Discharge Summary (Signed)
   NAME:  [name], Joseph [name] NO.:  [id number]   MEDICAL RECORD NO.:  [id number]                   PATIENT TYPE:  INP   LOCATION:  6529                                 FACILITY:  [facility]   PHYSICIAN:  Carole Binning, M.D. [facility]         DATE OF BIRTH:  Dec 09, 1940   DATE OF ADMISSION:  05/11/2003  DATE OF DISCHARGE:                                 DISCHARGE SUMMARY   ADDENDUM:  The microbiology laboratory called with the preliminary results  on the patient's urine culture.  It is growing out greater than 100,000  colony-forming units of gram negative rods.  The patient will be placed on  Cipro 500 mg b.i.d. for the next seven days.  He will need follow-up  urinalysis and urine culture with his doctor at the [facility] in the  next one to two weeks.      Tereso Newcomer, P.A.                        Carole Binning, M.D. [facility]    SW/MEDQ  D:  05/15/2003  T:  05/15/2003  Job:  [id number]   cc:   [facility]

## 2010-12-04 NOTE — Assessment & Plan Note (Signed)
[facility]                            CARDIOLOGY OFFICE NOTE   DEAKEN, JURGENS                       MRN:          [id number]  DATE:10/10/2006                            DOB:          1941-01-05    PRIMARY CARE PHYSICIAN:  Dr. Durward Parcel   REASON FOR VISIT:  Cardiac followup.   HISTORY OF PRESENT ILLNESS:  Mr. Aird returns to clinic reporting no  significant angina.  He does get more fatigued in the evenings, he says,  but has otherwise tolerated exertion well.  He spread out mulch for 3  hours the other day and had no angina or limiting dyspnea with this  activity.  His electrocardiogram today shows sinus bradycardia with  decreased R wave progression, more prominent than the prior tracing.  He  did have a Myoview back in July 2007 prior to considering back surgery  and this study revealed a small area of anteroapical scar without  ischemia and overall preserved ejection fraction.  He tells me that he  has a kidney stone and that he is scheduled for a transurethral stone  retrieval in early April.  We are not anticipating any additional  cardiac testing at this time.   ALLERGIES:  No known drug allergies.   PRESENT MEDICATIONS:  1. Aspirin 325 mg p.o. daily.  2. Lopressor 25 mg p.o. b.i.d.  3. Simvastatin 40 mg p.o. daily.  4. Lisinopril 20 mg p.o. daily.  5. Glimepiride 4 mg p.o. daily.   REVIEW OF SYSTEMS:  As described in the history of present illness.  Otherwise negative.   EXAMINATION:  VITAL SIGNS:  Blood pressure 151/84, heart rate is 51,  weight is 233 pounds.  GENERAL:  The patient is comfortable in no acute distress.  NECK:  Reveals no elevated jugular venous pressure or loud bruits, no  thyromegaly is noted.  LUNGS:  Clear without labored breathing at rest.  CARDIAC:  Reveals a regular rate and rhythm without loud murmur or  gallop.  ABDOMEN:  Soft, nontender.  EXTREMITIES:  Show no pitting edema.   Laboratory data  from February showed normal liver function tests.  Total  cholesterol 108, triglycerides 134, HDL 29, LDL 51.   IMPRESSION AND RECOMMENDATIONS:  1. Coronary artery disease status post previous anterior wall      myocardial infarction in 2004 with subsequent stent placement to      the left anterior descending artery and right coronary arteries.      His last Myoview showed anteroapical scar without ischemia and      preserved ejection fraction in July 2007.  He is stable      symptomatically and at this point will continue medical therapy.  I      would expect he should be able to proceed with his planned      urological procedure without any further cardiac testing.  I will      otherwise plan to see him back over the next 6 months.  2. Hyperlipidemia, LDL controlled much better now back on simvastatin.     Illene Bolus  Diona Browner, MD  Electronically Signed    SGM/MedQ  DD: 10/10/2006  DT: 10/10/2006  Job #: [id number]   cc:   Windy Fast L. Earlene Plater, M.D.  Dr. Durward Parcel

## 2010-12-04 NOTE — Assessment & Plan Note (Signed)
[facility]                              CARDIOLOGY OFFICE NOTE   DAELIN, HASTE                       MRN:          [id number]  DATE:02/11/2006                            DOB:          11/19/1940    PRIMARY CARE PHYSICIAN:  Dr. Durward Parcel   SURGEON:  Sharolyn Douglas, MD   REASON FOR VISIT:  Preoperative evaluation.   HISTORY OF PRESENT ILLNESS:  I saw Mr. Joseph [name] back in March.  He has a  history of previous anterior wall myocardial infarction with subsequent  stent placement to the left anterior descending and right coronary artery.  He has done well without significant angina or limiting shortness of breath.  His last myocardial perfusion study was approximately 2 years ago.  Today's  electrocardiogram shows sinus bradycardia with decreased R-wave progression  in the anteroseptal leads but no marked changes compared to his prior  tracing from March.  He is being considered for lumbar disc surgery under  general anesthesia in August and we have been asked to evaluate him  preoperatively.  I reviewed his medications today.  He continues to stay off  of Zocor for the time being.   ALLERGIES:  NO KNOWN DRUG ALLERGIES.   PRESENT MEDICATION:  1.  Aspirin 325 mg p.o. daily.  2.  Lopressor 25 mg p.o. b.i.d.  3.  Lisinopril 20 mg p.o. daily.  4.  Sublingual nitroglycerin p.r.n. (has not used).   REVIEW OF SYSTEMS:  As per history of present illness.   EXAMINATION:  Blood pressure today is 132/80, heart rate is 50, weight is  229 pounds.  GENERAL:  The patient is in no acute distress.  NECK:  Examination reveals no elevated jugular venous pressure or loud  bruits.  No thyromegaly is noted.  LUNGS:  Clear without labored breathing.  CARDIAC:  Exam reveals a regular rate and rhythm without loud murmur or S3  gallop.  EXTREMITIES:  Show no significant pitting edema.   IMPRESSION AND RECOMMENDATIONS:  1.  Coronary artery disease status post  previous anterior wall myocardial      infarction in October 2004 with subsequent stent placement to the left      anterior descending and ultimately the mid to distal right coronary      artery.  Ejection fraction is within normal limits by follow-up testing.      We will plan a follow-up adenosine Myoview on medical therapy to exclude      any major progression in ischemic burden.  Fortunately, he is not      reporting any angina or heart failure symptoms at this time.  I would      anticipate that if this study is low risk he should be able to proceed      on cardiac medical therapy.  Will plan to forward results to Dr. Noel Gerold.  2.  Will also schedule a follow-up visit postoperatively.  3.  Hyperlipidemia, presently not on statin therapy.  I think some of his      lower extremity symptoms were probably related to his  disc disease as I      have mentioned before, although he does state that some of the cramping      is improved off Zocor.  We will plan to revisit this down the road.                                Jonelle Sidle, MD    SGM/MedQ  DD:  02/11/2006  DT:  02/11/2006  Job #:  [id number]   cc:   Sharolyn Douglas, MD  Domenica Fail

## 2011-03-31 ENCOUNTER — Encounter: Payer: Self-pay | Admitting: Cardiology

## 2011-03-31 ENCOUNTER — Ambulatory Visit (INDEPENDENT_AMBULATORY_CARE_PROVIDER_SITE_OTHER): Payer: Medicare Other | Admitting: Cardiology

## 2011-03-31 DIAGNOSIS — I2510 Atherosclerotic heart disease of native coronary artery without angina pectoris: Secondary | ICD-10-CM

## 2011-03-31 DIAGNOSIS — I739 Peripheral vascular disease, unspecified: Secondary | ICD-10-CM

## 2011-03-31 DIAGNOSIS — E7800 Pure hypercholesterolemia, unspecified: Secondary | ICD-10-CM

## 2011-03-31 DIAGNOSIS — I10 Essential (primary) hypertension: Secondary | ICD-10-CM

## 2011-03-31 DIAGNOSIS — M79606 Pain in leg, unspecified: Secondary | ICD-10-CM | POA: Insufficient documentation

## 2011-03-31 DIAGNOSIS — M79609 Pain in unspecified limb: Secondary | ICD-10-CM

## 2011-03-31 LAB — LIPID PANEL
LDL Cholesterol: 68 mg/dL (ref 0–99)
Total CHOL/HDL Ratio: 4
Triglycerides: 127 mg/dL (ref 0.0–149.0)
VLDL: 25.4 mg/dL (ref 0.0–40.0)

## 2011-03-31 LAB — BASIC METABOLIC PANEL
BUN: 21 mg/dL (ref 6–23)
CO2: 26 mEq/L (ref 19–32)
Calcium: 9.5 mg/dL (ref 8.4–10.5)
Creatinine, Ser: 1.2 mg/dL (ref 0.4–1.5)
GFR: 61.39 mL/min (ref >60.00)
Glucose, Bld: 163 mg/dL — ABNORMAL HIGH (ref 70–99)

## 2011-03-31 LAB — HEPATIC FUNCTION PANEL
Albumin: 4.5 g/dL (ref 3.5–5.2)
Alkaline Phosphatase: 52 U/L (ref 39–117)

## 2011-03-31 NOTE — Assessment & Plan Note (Signed)
Stable with no chest pain.  Continue statin, ASA, lisinopril, and metoprolol.  HR is a little low today but patient has had no bradycardic symptoms.  Will leave metoprolol dose the same for now.

## 2011-03-31 NOTE — Assessment & Plan Note (Signed)
Bilateral hip and thigh pain.  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  However, I have difficulty palpating his PT pulses.  I will check peripheral arterial dopplers to make sure that there is no evidence for significant PAD.

## 2011-03-31 NOTE — Progress Notes (Signed)
PCP: [name] ([place])  70 yo with history of CAD s/p anterior MI in 2004 and LAD/RCA PCIs presents for cardiology followup.   Patient has not had any chest pain.  He has a history of back pain and lumbar spine degenerative disease.  Patient is able to walk on flat ground without dyspnea.  He is mildly short of breath with walking up hills.  He does have pain in his hips and thighs bilaterally with walking.  His chronic abdominal pain improved considerably off metformin.  Blood glucose seems to be under reasonable control now on a high dose of glipizide.    Labs (3/11): LDL 90, HDL 40, K 4.4, creatinine 1.0 Labs (4/11): K 5.1, creatinine 1.4 Labs (9/11): K 4.5, creatinine 0.97, LDL 83, HDL 35 Labs (3/12): HDL 29, LDL 74, K 4.6, creatinine 1.1  ECG: NSR, old ASMI, rate 48  Past Medical History: 1. 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  s/p back surgery.  2. Diabetes mellitus 3. Hypertension: had side effects with chlorthalidone.  4. Hyperlipidemia: intolerant to multiple statins due to muscle pain.  He is able to tolerate lovastatin.  5. CAD: Anterior MI in 2004.  Patient had LAD and RCA PCI at the time.  Last myoview was in 7/07: EF 62%, small area of anteroapical infarct.  Echo (3/11): EF 60%, normal wall motion, mild MR, mild left atrial enlargement, mildly dilated ascending aorta.   6.  Arterial dopplers (3/11): no evidence for significant PAD.  7.  Allergic rhinitis 8.  Abdominal discomfort: EGD 2011 showed no significant abnormalities.  Possibly due to metformin.  9.  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  Tobacco Use - No.  Alcohol Use - no Drug Use - no  Review of Systems        All systems reviewed and negative except as per HPI.   Current Outpatient Prescriptions  Medication Sig Dispense Refill  . amLODipine (NORVASC) 5 MG tablet  TAKE 1 TABLET DAILY  30 tablet  6  . aspirin 81 MG tablet Take 162 mg by mouth daily.        . fexofenadine (ALLEGRA) 180 MG tablet Take 180 mg by mouth as needed.       [name] glipiZIDE (GLUCOTROL) 10 MG tablet Take 10 mg by mouth 2 (two) times daily before a meal.        . lisinopril (PRINIVIL,ZESTRIL) 40 MG tablet Take 40 mg by mouth daily.        [name] lovastatin (MEVACOR) 20 MG tablet Take 1 tablet (20 mg total) by mouth at bedtime.  90 tablet  3  . metoprolol (LOPRESSOR) 50 MG tablet Take 25 mg by mouth 2 (two) times daily.       . nitroGLYCERIN (NITROSTAT) 0.4 MG SL tablet Place 0.4 mg under the tongue every 5 (five) minutes as needed.        . ranitidine (ZANTAC) 150 MG tablet Take 150 mg by mouth 2 (two) times daily.        . traMADol (ULTRAM) 50 MG tablet Take 50 mg by mouth 2 (two) times daily as needed.          BP 122/78  Pulse 50  Ht 6\' 2"  (1.88 m)  Wt 212 lb (96.163 kg)  BMI 27.22 kg/m2 General: NAD Neck: No JVD, no thyromegaly or thyroid nodule.  Lungs: Clear to auscultation bilaterally  with normal respiratory effort. CV: Nondisplaced PMI.  Heart regular S1/S2, no S3, no murmur.  +S4.  No peripheral edema.  No carotid bruit.  Difficult to palpate PT pulses.  Abdomen: Soft, nontender, no hepatosplenomegaly, no distention.  Neurologic: Alert and oriented x 3.  Psych: Normal affect. Extremities: No clubbing or cyanosis.

## 2011-03-31 NOTE — Assessment & Plan Note (Signed)
BP is under good control.  

## 2011-03-31 NOTE — Assessment & Plan Note (Signed)
Check lipids/LFTs today, goal LDL < 70.  

## 2011-03-31 NOTE — Patient Instructions (Signed)
Your physician recommends that you return for a FASTING lipid profile/liver profile/BMP today---414.01   Schedule an appointment for a lower extremity arterial doppler.  Your physician wants you to follow-up in: 6 months with Dr Shirlee Latch. (March 2013). You will receive a reminder letter in the mail two months in advance. If you don't receive a letter, please call our office to schedule the follow-up appointment.

## 2011-04-05 ENCOUNTER — Telehealth: Payer: Self-pay | Admitting: Cardiology

## 2011-04-05 NOTE — Telephone Encounter (Signed)
Rtn call re lab results

## 2011-04-05 NOTE — Telephone Encounter (Signed)
Notified of lab results. Will send copy to Dr. [name]

## 2011-04-23 ENCOUNTER — Encounter: Payer: Self-pay | Admitting: Cardiology

## 2011-04-27 ENCOUNTER — Encounter (INDEPENDENT_AMBULATORY_CARE_PROVIDER_SITE_OTHER): Payer: Medicare Other | Admitting: *Deleted

## 2011-04-27 DIAGNOSIS — I739 Peripheral vascular disease, unspecified: Secondary | ICD-10-CM

## 2011-07-28 ENCOUNTER — Encounter (HOSPITAL_COMMUNITY): Payer: Self-pay | Admitting: Emergency Medicine

## 2011-07-28 ENCOUNTER — Emergency Department (HOSPITAL_COMMUNITY): Payer: Medicare Other

## 2011-07-28 ENCOUNTER — Emergency Department (HOSPITAL_COMMUNITY)
Admission: EM | Admit: 2011-07-28 | Discharge: 2011-07-28 | Disposition: A | Discharge status: Home / Self Care | Payer: Medicare Other | Attending: Emergency Medicine | Admitting: Emergency Medicine

## 2011-07-28 DIAGNOSIS — M5030 Other cervical disc degeneration, unspecified cervical region: Secondary | ICD-10-CM | POA: Insufficient documentation

## 2011-07-28 DIAGNOSIS — M549 Dorsalgia, unspecified: Secondary | ICD-10-CM

## 2011-07-28 DIAGNOSIS — I10 Essential (primary) hypertension: Secondary | ICD-10-CM | POA: Insufficient documentation

## 2011-07-28 DIAGNOSIS — I2510 Atherosclerotic heart disease of native coronary artery without angina pectoris: Secondary | ICD-10-CM | POA: Insufficient documentation

## 2011-07-28 DIAGNOSIS — E119 Type 2 diabetes mellitus without complications: Secondary | ICD-10-CM | POA: Insufficient documentation

## 2011-07-28 DIAGNOSIS — M47812 Spondylosis without myelopathy or radiculopathy, cervical region: Secondary | ICD-10-CM

## 2011-07-28 DIAGNOSIS — R61 Generalized hyperhidrosis: Secondary | ICD-10-CM | POA: Insufficient documentation

## 2011-07-28 DIAGNOSIS — M546 Pain in thoracic spine: Secondary | ICD-10-CM | POA: Insufficient documentation

## 2011-07-28 DIAGNOSIS — R5381 Other malaise: Secondary | ICD-10-CM | POA: Insufficient documentation

## 2011-07-28 DIAGNOSIS — M542 Cervicalgia: Secondary | ICD-10-CM | POA: Insufficient documentation

## 2011-07-28 LAB — DIFFERENTIAL
Eosinophils Relative: 3 % (ref 0–5)
Lymphocytes Relative: 23 % (ref 12–46)
Lymphs Abs: 2.4 10*3/uL (ref 0.7–4.0)
Monocytes Absolute: 0.8 10*3/uL (ref 0.1–1.0)
Neutro Abs: 6.7 10*3/uL (ref 1.7–7.7)

## 2011-07-28 LAB — URINALYSIS, ROUTINE W REFLEX MICROSCOPIC
Bilirubin Urine: NEGATIVE
Hgb urine dipstick: NEGATIVE
Ketones, ur: NEGATIVE mg/dL
Nitrite: NEGATIVE
Protein, ur: NEGATIVE mg/dL
Specific Gravity, Urine: 1.024 (ref 1.005–1.030)
Urobilinogen, UA: 0.2 mg/dL (ref 0.0–1.0)

## 2011-07-28 LAB — BASIC METABOLIC PANEL
CO2: 27 mEq/L (ref 19–32)
Calcium: 10.6 mg/dL — ABNORMAL HIGH (ref 8.4–10.5)
Chloride: 102 mEq/L (ref 96–112)
Creatinine, Ser: 1.28 mg/dL (ref 0.50–1.35)
Glucose, Bld: 177 mg/dL — ABNORMAL HIGH (ref 70–99)
Sodium: 138 mEq/L (ref 135–145)

## 2011-07-28 LAB — CBC
HCT: 44 % (ref 39.0–52.0)
MCV: 87.5 fL (ref 78.0–100.0)
RBC: 5.03 MIL/uL (ref 4.22–5.81)
WBC: 10.1 10*3/uL (ref 4.0–10.5)

## 2011-07-28 LAB — CARDIAC PANEL(CRET KIN+CKTOT+MB+TROPI)
CK, MB: 2.8 ng/mL (ref 0.3–4.0)
Relative Index: INVALID (ref 0.0–2.5)

## 2011-07-28 LAB — PROTIME-INR
INR: 1.06 (ref 0.00–1.49)
Prothrombin Time: 14 seconds (ref 11.6–15.2)

## 2011-07-28 LAB — URINE MICROSCOPIC-ADD ON

## 2011-07-28 MED ORDER — ASPIRIN 81 MG PO CHEW
324.0000 mg | CHEWABLE_TABLET | Freq: Once | ORAL | Status: AC
  Administered 2011-07-28: 324 mg via ORAL
  Filled 2011-07-28: qty 4

## 2011-07-28 MED ORDER — ACETAMINOPHEN 80 MG PO CHEW: 80.0000 mg | CHEWABLE_TABLET | Freq: Once | ORAL | Status: DC

## 2011-07-28 NOTE — ED Notes (Signed)
Pt st's he started having numbness in neck and bil. Shoulders approx 3 days ago.  St's had a CT of brain earlier today in [place] and was told it was abnormal.   Also st's that earlier today he was having pain in neck and across shoulders.  St's at this time he is continuing to have pain in neck.  Denies chest pain

## 2011-07-28 NOTE — ED Provider Notes (Signed)
History     CSN: [id number]  Arrival date & time 07/28/11  1722   First MD Initiated Contact with Patient 07/28/11 1819     Chief complaint: Neck pain   (Consider location/radiation/quality/duration/timing/severity/associated sxs/prior treatment) The history is provided by the patient, the spouse and a relative.   Joseph [name] is a 71 y.o. male presents with c/o  exertional neck pain after walking 4 miles  leading to desire to be assessed in the ED. The sx(s) have been intermittent for 4 days, only, with exertion. Today, he was using a rod to look for a septic tank when he had recurrence of the neck pain and sweating and felt weak. It improved when he sat down. Additional concerns are he has a stiffness  in his neck. He denies numbness in hands or feet, face, or chest at this time. No chest pain.  Causative factors are  Exertion . Palliative factors are  rest . The distress associated is  mild . The disorder has been present for  4 days.  The patient was seen earlier today by his PCP with the above complaints. He was sent for a CT of the head to evaluate the discomfort. He had a call to come to the emergency room based on head CT results. He apparently had an EKG done in the office as well today. That is not currently available.   Past Medical History  Diagnosis Date  . 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 DDD (degenerative disc disease)   . DM (diabetes mellitus)   . HTN (hypertension)   . HLD (hyperlipidemia)   . CAD (coronary artery disease)   . Allergic rhinitis     Past Surgical History  Procedure Date  . Coronary stent placement 2004    x2  . Prostate surgery   . Spinal fusion     L4-S1    No family history on file.  History  Substance Use Topics  . Smoking status: Never Smoker   . Smokeless tobacco: Never Used  . Alcohol Use: No      Review of Systems  All other systems reviewed and are negative.    Allergies  Review of patient's allergies indicates no known  allergies.  Home Medications   Current Outpatient Rx  Name Route Sig Dispense Refill  . AMLODIPINE BESYLATE 5 MG PO TABS Oral Take 5 mg by mouth daily.    . ASPIRIN 81 MG PO TABS Oral Take 162 mg by mouth daily.      [name] GLIPIZIDE 10 MG PO TABS Oral Take 10 mg by mouth 2 (two) times daily before a meal.      . LIRAGLUTIDE 18 MG/3ML [place] SOLN Subcutaneous Inject 1.8 mg into the skin.    [name] LISINOPRIL 40 MG PO TABS Oral Take 40 mg by mouth daily.      [name] LOVASTATIN 20 MG PO TABS Oral Take 1 tablet (20 mg total) by mouth at bedtime. 90 tablet 3  . METOPROLOL TARTRATE 50 MG PO TABS Oral Take 25 mg by mouth 2 (two) times daily.     [name] NITROGLYCERIN 0.4 MG SL SUBL Sublingual Place 0.4 mg under the tongue every 5 (five) minutes as needed. For chest pain    . RANITIDINE HCL 150 MG PO TABS Oral Take 150 mg by mouth 2 (two) times daily.      . TRAMADOL HCL 50 MG PO TABS Oral Take 50 mg by mouth 2 (two) times daily as  needed. For pain    . VITAMIN B-12 1000 MCG PO TABS Oral Take 1,000 mcg by mouth daily.      BP 123/74  Pulse 71  Temp(Src) 97.7 F (36.5 C) (Oral)  Resp 16  Ht 6\' 2"  (1.88 m)  Wt 214 lb (97.07 kg)  BMI 27.48 kg/m2  SpO2 99%  Physical Exam  Nursing note and vitals reviewed. Constitutional: He is oriented to person, place, and time. He appears well-developed and well-nourished.  HENT:  Head: Normocephalic and atraumatic.  Right Ear: External ear normal.  Left Ear: External ear normal.  Eyes: Conjunctivae and EOM are normal. Pupils are equal, round, and reactive to light.  Neck: Normal range of motion and phonation normal. Neck supple.  Cardiovascular: Normal rate, regular rhythm, normal heart sounds and intact distal pulses.   Pulmonary/Chest: Effort normal and breath sounds normal. He exhibits no bony tenderness.  Abdominal: Soft. Normal appearance. There is no tenderness.  Musculoskeletal:       Limited lateral bending of the neck, otherwise, normal range of motion of the neck    Neurological: He is alert and oriented to person, place, and time. He has normal strength. No cranial nerve deficit or sensory deficit. He exhibits normal muscle tone. Coordination normal.  Skin: Skin is warm, dry and intact.  Psychiatric: He has a normal mood and affect. His behavior is normal. Judgment and thought content normal.    ED Course  Procedures (including critical care time)  Date: 07/28/2011  Rate: 72  Rhythm: normal sinus rhythm  QRS Axis: normal  Intervals: normal  ST/T Wave abnormalities: normal  Conduction Disutrbances:first-degree A-V block   Narrative Interpretation: PR slightly longer ( )  Old EKG Reviewed: changes noted Patient had a head CT done at [facility] read by [facility] radiology as nonspecific 1 cm hypodensity below the left lentiform nucleus, possibly an age indeterminate infarct. Mild atrophy noted as well.      Labs Reviewed  BASIC METABOLIC PANEL - Abnormal; Notable for the following:    Potassium 5.3 (*)    Glucose, Bld 177 (*)    BUN 24 (*)    Calcium 10.6 (*)    GFR calc non Af Amer 54 (*)    GFR calc Af Amer 63 (*)    All other components within normal limits  URINALYSIS, ROUTINE W REFLEX MICROSCOPIC - Abnormal; Notable for the following:    Glucose, UA >1000 (*)    All other components within normal limits  CBC  DIFFERENTIAL  CARDIAC PANEL(CRET KIN+CKTOT+MB+TROPI)  PROTIME-INR  URINE MICROSCOPIC-ADD ON   Dg Chest 2 View  07/28/2011  *RADIOLOGY REPORT*  Clinical Data: Pain.  CHEST - 2 VIEW  Comparison: 02/22/2006  Findings: The lungs are clear without focal infiltrate, edema, pneumothorax or pleural effusion. The cardiopericardial silhouette is within normal limits for size. Imaged bony structures of the thorax are intact. Telemetry leads overlie the chest.  IMPRESSION: Stable.  No acute findings.  Original Report Authenticated By: ERIC A. MANSELL, M.D.   Dg Cervical Spine Complete  07/28/2011  *RADIOLOGY REPORT*  Clinical  Data: Next embolus.  CERVICAL SPINE - COMPLETE 4+ VIEW  Comparison: None.  Findings: Straightening of the normal cervical lordosis is noted. No evidence for fracture.  Loss of disc height is seen at C3-4, C4- 5, C5-6, C6-7.  There is diffuse bilateral facet degeneration, left greater than right.  Uncinate spurring is also more prominent on the left.  There is some mild diffuse bony foraminal encroachment  on the left.  No prevertebral soft tissue swelling.  IMPRESSION: Degenerative changes without acute bony findings.  Original Report Authenticated By: ERIC A. MANSELL, M.D.   Reeval: 21:11- The patient has had no exacerbation of discomfort while in the emergency department. He continues to have some mild stiffness in his neck. I discussed the case with the cardiologist on-call for his physician. We reviewed the findings today as well as the past medical history. He will arrange to have the patient contacted to schedule followup appointment the patient's primary cardiologist. Vitals at discharge are normal  1. Neck pain   2. Pain, upper back   3. DJD (degenerative joint disease), cervical       MDM  Nonspecific neck discomfort, exertional in nature. Head CT done earlier today has incidental abnormality un-related to the clinical syndrome he presents with. Evaluation today is consistent with musculoskeletal pain secondary to degenerative joint disease of the neck. Patient has exertional component to the pain, but no chest pain. The discomfort was transient today, as it was previously.        Flint Melter, MD 07/28/11 2120

## 2011-07-29 ENCOUNTER — Telehealth: Payer: Self-pay | Admitting: Cardiology

## 2011-07-29 NOTE — Telephone Encounter (Signed)
Patient's daughter called, she would like to know  if pt can be seen in the clinic soon by Dr. Shirlee Latch. Patient was in the ER last night with C/O of shoulder blade and upper back tightness , weakness and SOB. The PCP recommended for pt. To have a stress test. Patient has an appointment for tomorrow 07/30/11 at 2:15PM daughter aware

## 2011-07-29 NOTE — Telephone Encounter (Signed)
New Msg: Pt daughter calling stating that pt was in ER last night c/o tightness in upper back and shoulders and c/o fatigue. Pt daughter wants to discuss possibility of carotid as well as pt seeing Dr. Shirlee Latch either tomorrow or early next week. Please return pt daughter call to discuss further.

## 2011-07-30 ENCOUNTER — Encounter: Payer: Self-pay | Admitting: Cardiology

## 2011-07-30 ENCOUNTER — Ambulatory Visit (INDEPENDENT_AMBULATORY_CARE_PROVIDER_SITE_OTHER): Payer: Medicare Other | Admitting: Cardiology

## 2011-07-30 DIAGNOSIS — I6350 Cerebral infarction due to unspecified occlusion or stenosis of unspecified cerebral artery: Secondary | ICD-10-CM

## 2011-07-30 DIAGNOSIS — I2510 Atherosclerotic heart disease of native coronary artery without angina pectoris: Secondary | ICD-10-CM

## 2011-07-30 DIAGNOSIS — R079 Chest pain, unspecified: Secondary | ICD-10-CM

## 2011-07-30 DIAGNOSIS — I639 Cerebral infarction, unspecified: Secondary | ICD-10-CM

## 2011-07-30 NOTE — Patient Instructions (Signed)
Your physician has requested that you have en exercise stress myoview. For further information please visit [email or web address] Please follow instruction sheet, as given.  Your physician has requested that you have a carotid duplex. This test is an ultrasound of the carotid arteries in your neck. It looks at blood flow through these arteries that supply the brain with blood. Allow one hour for this exam. There are no restrictions or special instructions.  Your physician recommends that you have lab work today--BMET   Your physician wants you to follow-up in: 6 months with Dr Shirlee Latch. (July 2013) You will receive a reminder letter in the mail two months in advance. If you don't receive a letter, please call our office to schedule the follow-up appointment.

## 2011-07-31 LAB — BASIC METABOLIC PANEL
BUN: 23 mg/dL (ref 6–23)
CO2: 25 mEq/L (ref 19–32)
Calcium: 10.2 mg/dL (ref 8.4–10.5)
Chloride: 104 mEq/L (ref 96–112)
Creat: 1.29 mg/dL (ref 0.50–1.35)
Glucose, Bld: 155 mg/dL — ABNORMAL HIGH (ref 70–99)

## 2011-08-01 DIAGNOSIS — Z8673 Personal history of transient ischemic attack (TIA), and cerebral infarction without residual deficits: Secondary | ICD-10-CM | POA: Insufficient documentation

## 2011-08-01 DIAGNOSIS — I639 Cerebral infarction, unspecified: Secondary | ICD-10-CM | POA: Insufficient documentation

## 2011-08-01 NOTE — Assessment & Plan Note (Signed)
Pain in back of neck radiating to shoulders could certainly be due to c-spine arthritis, which was seen on imaging in ER.  However, it is somewhat concerning that it is new and associated with exertion.  He has known CAD.  Will get an ETT-myoview.  He will continue ASA, statin, metoprolol, lisinopril.  Lipids were at goal in 9/12.

## 2011-08-01 NOTE — Assessment & Plan Note (Signed)
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  Will get carotid dopplers.    I am also going to get a BMET as K in ER was high (sample may have been hemolyzed).

## 2011-08-01 NOTE — Progress Notes (Signed)
PCP: [name] ([place])  71 yo with history of CAD s/p anterior MI in 2004 and LAD/RCA PCIs presents for cardiology followup.   He went to the ER earlier this week with pain in his posterior neck radiating to the bilateral shoulders.  He noted this 3 mornings when walking in [place].  The pain would start after 2-3 laps around [employer], then resolve with rest.  No chest pain. He developed the same symptoms while working outside trying to locate his septic tank line.  His prior ischemic pain was chest pain.  He had a head and neck CT done to assess for C-spine arthritis.  He was found to have arthritis, but there was an incidental finding of a possible small old CVA.  Cardiac enzymes and ECG in the ER earlier this week were normal, and he was sent home to followup here.  He was told that the symptoms were likely from his neck arthritis but there was some concern because they were exertional.    Labs (3/11): LDL 90, HDL 40, K 4.4, creatinine 1.0 Labs (4/11): K 5.1, creatinine 1.4 Labs (9/11): K 4.5, creatinine 0.97, LDL 83, HDL 35 Labs (3/12): HDL 29, LDL 74, K 4.6, creatinine 1.1 Labs (9/12): LDL 60, HDL 36 Labs (1/13): Cardiac enzymes negative, K 5.3, creatinine 1.28  ECG: NSR, old ASMI, no change  Past Medical History: 1. 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  s/p back surgery.  2. Diabetes mellitus 3. Hypertension: had side effects with chlorthalidone.  4. Hyperlipidemia: intolerant to multiple statins due to muscle pain.  He is able to tolerate lovastatin.  5. CAD: Anterior MI in 2004.  Patient had LAD and RCA PCI at the time.  Last myoview was in 7/07: EF 62%, small area of anteroapical infarct.  Echo (3/11): EF 60%, normal wall motion, mild MR, mild left atrial enlargement, mildly dilated ascending aorta.   6.  Arterial dopplers (3/11): no evidence for significant PAD. ABIs 10/12: Normal.  7.  Allergic rhinitis 8.  Abdominal discomfort: EGD 2011 showed no  significant abnormalities.  Possibly due to metformin.  9.  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  Tobacco Use - No.  Alcohol Use - no Drug Use - no  Review of Systems        All systems reviewed and negative except as per HPI.   Current Outpatient Prescriptions  Medication Sig Dispense Refill  . amLODipine (NORVASC) 5 MG tablet Take 5 mg by mouth daily.      [name] aspirin 81 MG tablet Take 162 mg by mouth daily.       [name] glipiZIDE (GLUCOTROL) 10 MG tablet Take 10 mg by mouth 2 (two) times daily before a meal.        . Liraglutide (VICTOZA) 18 MG/3ML SOLN Inject 1.8 mg into the skin.      [name] lisinopril (PRINIVIL,ZESTRIL) 40 MG tablet Take 40 mg by mouth daily.        [name] lovastatin (MEVACOR) 20 MG tablet Take 1 tablet (20 mg total) by mouth at bedtime.  90 tablet  3  . metoprolol (LOPRESSOR) 50 MG tablet Take 25 mg by mouth 2 (two) times daily.       . nitroGLYCERIN (NITROSTAT) 0.4 MG SL tablet Place 0.4 mg under the tongue every 5 (five) minutes as needed. For chest pain      .  ranitidine (ZANTAC) 150 MG tablet Take 150 mg by mouth 2 (two) times daily.        . vitamin B-12 (CYANOCOBALAMIN) 1000 MCG tablet Take 1,000 mcg by mouth daily.        BP 140/79  Pulse 60  Ht 6\' 2"  (1.88 m)  Wt 97.977 kg (216 lb)  BMI 27.73 kg/m2 General: NAD Neck: No JVD, no thyromegaly or thyroid nodule.  Lungs: Clear to auscultation bilaterally with normal respiratory effort. CV: Nondisplaced PMI.  Heart regular S1/S2, no S3, no murmur.  +S4.  No peripheral edema.  No carotid bruit.  Difficult to palpate PT pulses.  Abdomen: Soft, nontender, no hepatosplenomegaly, no distention.  Neurologic: Alert and oriented x 3.  Psych: Normal affect. Extremities: No clubbing or cyanosis.

## 2011-08-09 ENCOUNTER — Ambulatory Visit (HOSPITAL_COMMUNITY): Payer: Medicare Other | Attending: Cardiology | Admitting: Radiology

## 2011-08-09 VITALS — BP 127/74 | Ht 74.0 in | Wt 207.0 lb

## 2011-08-09 DIAGNOSIS — E785 Hyperlipidemia, unspecified: Secondary | ICD-10-CM | POA: Insufficient documentation

## 2011-08-09 DIAGNOSIS — R5381 Other malaise: Secondary | ICD-10-CM | POA: Insufficient documentation

## 2011-08-09 DIAGNOSIS — E119 Type 2 diabetes mellitus without complications: Secondary | ICD-10-CM | POA: Insufficient documentation

## 2011-08-09 DIAGNOSIS — I252 Old myocardial infarction: Secondary | ICD-10-CM | POA: Insufficient documentation

## 2011-08-09 DIAGNOSIS — M542 Cervicalgia: Secondary | ICD-10-CM | POA: Insufficient documentation

## 2011-08-09 DIAGNOSIS — R0602 Shortness of breath: Secondary | ICD-10-CM

## 2011-08-09 DIAGNOSIS — M25519 Pain in unspecified shoulder: Secondary | ICD-10-CM | POA: Insufficient documentation

## 2011-08-09 DIAGNOSIS — I2510 Atherosclerotic heart disease of native coronary artery without angina pectoris: Secondary | ICD-10-CM

## 2011-08-09 DIAGNOSIS — R42 Dizziness and giddiness: Secondary | ICD-10-CM | POA: Insufficient documentation

## 2011-08-09 DIAGNOSIS — I10 Essential (primary) hypertension: Secondary | ICD-10-CM | POA: Insufficient documentation

## 2011-08-09 DIAGNOSIS — I739 Peripheral vascular disease, unspecified: Secondary | ICD-10-CM | POA: Insufficient documentation

## 2011-08-09 DIAGNOSIS — Z8249 Family history of ischemic heart disease and other diseases of the circulatory system: Secondary | ICD-10-CM | POA: Insufficient documentation

## 2011-08-09 DIAGNOSIS — R5383 Other fatigue: Secondary | ICD-10-CM | POA: Insufficient documentation

## 2011-08-09 DIAGNOSIS — R079 Chest pain, unspecified: Secondary | ICD-10-CM

## 2011-08-09 DIAGNOSIS — I4949 Other premature depolarization: Secondary | ICD-10-CM

## 2011-08-09 DIAGNOSIS — R51 Headache: Secondary | ICD-10-CM | POA: Insufficient documentation

## 2011-08-09 MED ORDER — TECHNETIUM TC 99M TETROFOSMIN IV KIT
33.0000 | PACK | Freq: Once | INTRAVENOUS | Status: AC | PRN
  Administered 2011-08-09: 33 via INTRAVENOUS

## 2011-08-09 MED ORDER — TECHNETIUM TC 99M TETROFOSMIN IV KIT
10.3000 | PACK | Freq: Once | INTRAVENOUS | Status: AC | PRN
  Administered 2011-08-09: 10 via INTRAVENOUS

## 2011-08-09 NOTE — Progress Notes (Addendum)
[facility] SITE 3 NUCLEAR MED [address] [place] [address] [phone]  Cardiology Nuclear Med Study  Joseph [name] is a 71 y.o. male [id number] 09/27/1940   Nuclear Med Background Indication for Stress Test:  Evaluation for Ischemia and Stent Patency History:  '04 AW STEMI>Stent-LAD,RCA, EF=45%; '07 GMW:NUUVO area  of anterior apical infarct, EF=62%; '11 Echo:EF=60%, mild MR, Cardiac Risk Factors: Claudication, Family History - CAD, Hypertension, Lipids and NIDDM  Symptoms:  Dizziness, Fatigue, Light-Headedness and neck, head and (B) shoulder pain with walking   Nuclear Pre-Procedure Caffeine/Decaff Intake:  None NPO After: 5:00pm   Lungs:  Clear. IV 0.9% NS with Angio Cath:  20g  IV Site: R Antecubital x 1, tolerated well IV Started by:  Irean Hong, RN  Chest Size (in):  46 Cup Size: n/a  Height: 6\' 2"  (1.88 m)  Weight:  207 lb (93.895 kg)  BMI:  Body mass index is 26.58 kg/(m^2). Tech Comments:  Metoprolol held x 24 hours    Nuclear Med Study 1 or 2 day study: 1 day  Stress Test Type:  Stress  Reading MD: Olga Millers, MD  Order Authorizing Provider:  Marca Ancona, MD  Resting Radionuclide: Technetium 74m Tetrofosmin  Resting Radionuclide Dose: 10.3 mCi   Stress Radionuclide:  Technetium 65m Tetrofosmin  Stress Radionuclide Dose: 33.0 mCi           Stress Protocol Rest HR: 66 Stress HR: 137  Rest BP: Sitting 127/74  Standing 119/73 Stress BP: 211/69  Exercise Time (min): 7:30 METS: 9.3   Predicted Max HR: 148 bpm % Max HR: 92.57 bpm Rate Pressure Product: 53664   Dose of Adenosine (mg):  n/a Dose of Lexiscan: n/a mg  Dose of Atropine (mg): n/a Dose of Dobutamine: n/a mcg/kg/min (at max HR)  Stress Test Technologist: Smiley Houseman, CMA-N  Nuclear Technologist:  Domenic Polite, CNMT     Rest Procedure:  Myocardial perfusion imaging was performed at rest 45 minutes following the intravenous administration of Technetium 51m  Tetrofosmin.  Rest ECG: Prior [facility]  Stress Procedure:  The patient exercised for 7:30 on the treadmill utilizing the Bruce protocol.  The patient stopped due to fatigue and denied any chest pain.  He c/o "numbness in the back of  my head" at 3-minutes into exercise.  There were no diagnostic ST-T wave changes, only occasional PAC's.  He had a hypertensive response to exercised, 211/69.  Technetium 82m Tetrofosmin was injected at peak exercise and myocardial perfusion imaging was performed after a brief delay.  Stress ECG: No significant ST segment change suggestive of ischemia.  QPS Raw Data Images:  Acquisition technically good; normal left ventricular size. Stress Images:  There is decreased uptake in the distal septum. Rest Images:  There is decreased uptake in the distal septum. Subtraction (SDS):  There is a fixed defect that is most consistent with a previous infarction. Transient Ischemic Dilatation (Normal <1.22):  0.89 Lung/Heart Ratio (Normal <0.45):  0.35  Quantitative Gated Spect Images QGS EDV:  69 ml QGS ESV:  29 ml QGS cine images:  NL LV Function; NL Wall Motion QGS EF: 58%  Impression Exercise Capacity:  Fair exercise capacity. BP Response:  Hypertensive blood pressure response. Clinical Symptoms:  No chest pain. ECG Impression:  No significant ST segment change suggestive of ischemia. Comparison with Prior Nuclear Study: No images to compare  Overall Impression:  Abnormal stress nuclear study with a small fixed defect in the distal septum consistent with  small prior infarct; no ischemia.   Olga Millers     Small prior infarction, no ischemia.  Low risk.  EF normal.    Marca Ancona 08/10/2011 10:40 AM

## 2011-08-10 ENCOUNTER — Other Ambulatory Visit: Payer: Self-pay | Admitting: Cardiology

## 2011-08-10 ENCOUNTER — Encounter (INDEPENDENT_AMBULATORY_CARE_PROVIDER_SITE_OTHER): Payer: Medicare Other | Admitting: *Deleted

## 2011-08-10 DIAGNOSIS — I6529 Occlusion and stenosis of unspecified carotid artery: Secondary | ICD-10-CM

## 2011-08-10 DIAGNOSIS — I639 Cerebral infarction, unspecified: Secondary | ICD-10-CM

## 2011-08-10 NOTE — Progress Notes (Signed)
Pt.notified

## 2011-08-10 NOTE — Progress Notes (Signed)
LMTCB

## 2011-08-18 ENCOUNTER — Encounter: Payer: Medicare Other | Admitting: Cardiology

## 2011-09-07 ENCOUNTER — Encounter (HOSPITAL_COMMUNITY): Payer: Medicare Other | Admitting: Radiology

## 2011-09-30 ENCOUNTER — Other Ambulatory Visit: Payer: Self-pay | Admitting: Cardiology

## 2011-12-16 DIAGNOSIS — L03019 Cellulitis of unspecified finger: Secondary | ICD-10-CM | POA: Insufficient documentation

## 2012-02-02 ENCOUNTER — Ambulatory Visit (INDEPENDENT_AMBULATORY_CARE_PROVIDER_SITE_OTHER): Payer: Medicare Other | Admitting: Cardiology

## 2012-02-02 ENCOUNTER — Encounter: Payer: Self-pay | Admitting: Cardiology

## 2012-02-02 VITALS — BP 158/72 | HR 44 | Ht 74.0 in | Wt 215.8 lb

## 2012-02-02 DIAGNOSIS — I2510 Atherosclerotic heart disease of native coronary artery without angina pectoris: Secondary | ICD-10-CM

## 2012-02-02 DIAGNOSIS — R000 Tachycardia, unspecified: Secondary | ICD-10-CM | POA: Insufficient documentation

## 2012-02-02 DIAGNOSIS — R001 Bradycardia, unspecified: Secondary | ICD-10-CM | POA: Insufficient documentation

## 2012-02-02 DIAGNOSIS — I10 Essential (primary) hypertension: Secondary | ICD-10-CM

## 2012-02-02 DIAGNOSIS — I498 Other specified cardiac arrhythmias: Secondary | ICD-10-CM

## 2012-02-02 DIAGNOSIS — E7800 Pure hypercholesterolemia, unspecified: Secondary | ICD-10-CM

## 2012-02-02 LAB — BASIC METABOLIC PANEL
CO2: 24 mEq/L (ref 19–32)
Calcium: 9.7 mg/dL (ref 8.4–10.5)
Chloride: 104 mEq/L (ref 96–112)
Glucose, Bld: 274 mg/dL — ABNORMAL HIGH (ref 70–99)
Potassium: 4.5 mEq/L (ref 3.5–5.1)
Sodium: 137 mEq/L (ref 135–145)

## 2012-02-02 LAB — LIPID PANEL: Total CHOL/HDL Ratio: 4

## 2012-02-02 MED ORDER — AMLODIPINE BESYLATE 10 MG PO TABS: 10.0000 mg | ORAL_TABLET | Freq: Every day | ORAL | Status: DC

## 2012-02-02 MED ORDER — METOPROLOL TARTRATE 50 MG PO TABS: 25.0000 mg | ORAL_TABLET | Freq: Two times a day (BID) | ORAL | Status: DC

## 2012-02-02 NOTE — Assessment & Plan Note (Signed)
Recent non-ischemic myoview in 1/13.  No chest pain or exertional dyspnea.  Continue ASA 81, statin, ACEI, lower dose metoprolol.

## 2012-02-02 NOTE — Assessment & Plan Note (Signed)
Tolerating lovastatin with some mild myalgias (much better than other statins).  He will try coenzyme Q10 200 mg daily.  He will get lipids today, goal LDL < 70.

## 2012-02-02 NOTE — Progress Notes (Signed)
Patient ID: Joseph [name], male   DOB: 1941/05/23, 71 y.o.   MRN: [id number] PCP: [name] ([place])  71 yo with history of CAD s/p anterior MI in 2004 and LAD/RCA PCIs presents for cardiology followup.   He had a nonischemic myoview in 1/13.  Since then, he has been doing well.  No chest pain, no exertional dyspnea.  He walks every morning for exercise.  HR has been running in the low 50s when he checks at home and is 44 in the office today.  He denies lightheadedness or syncope.  He has some mild aching in his thighs that may be due to his statin.  Was worse with other statins than with lovastatin.  Labs (3/11): LDL 90, HDL 40, K 4.4, creatinine 1.0 Labs (4/11): K 5.1, creatinine 1.4 Labs (9/11): K 4.5, creatinine 0.97, LDL 83, HDL 35 Labs (3/12): HDL 29, LDL 74, K 4.6, creatinine 1.1 Labs (9/12): LDL 60, HDL 36 Labs (1/13): Cardiac enzymes negative, K 5.3, creatinine 1.28  ECG: sinus bradycardia, 1st degree AV block, old ASMI  Past Medical History: 1. 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  s/p back surgery.  2. Diabetes mellitus 3. Hypertension: had side effects with chlorthalidone.  4. Hyperlipidemia: intolerant to multiple statins due to muscle pain.  He is able to tolerate lovastatin.  5. CAD: Anterior MI in 2004.  Patient had LAD and RCA PCI at the time.  Last myoview was in 7/07: EF 62%, small area of anteroapical infarct.  Echo (3/11): EF 60%, normal wall motion, mild MR, mild left atrial enlargement, mildly dilated ascending aorta.   Myoview (1/13): Small fixed apical septal defect with no ischemia, EF 58%.  6.  Arterial dopplers (3/11): no evidence for significant PAD. ABIs 10/12: Normal.  7.  Allergic rhinitis 8.  Abdominal discomfort: EGD 2011 showed no significant abnormalities.  Possibly due to metformin.  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  Carotid dopplers in 1/13 showed minimal disease.   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  Tobacco Use - No.  Alcohol Use - no Drug Use - no  Review of Systems        All systems reviewed and negative except as per HPI.   Current Outpatient Prescriptions  Medication Sig Dispense Refill  . amLODipine (NORVASC) 10 MG tablet Take 1 tablet (10 mg total) by mouth daily.  90 tablet  3  . aspirin 81 MG tablet Take 162 mg by mouth daily.       . fish oil-omega-3 fatty acids 1000 MG capsule Take 2 g by mouth as directed.      [name] glipiZIDE (GLUCOTROL) 10 MG tablet Take 10 mg by mouth 2 (two) times daily before a meal.        . Liraglutide (VICTOZA) 18 MG/3ML SOLN Inject 1.8 mg into the skin.      [name] lisinopril (PRINIVIL,ZESTRIL) 40 MG tablet Take 40 mg by mouth daily.        [name] lovastatin (MEVACOR) 20 MG tablet TAKE 1 TABLET (20 MG TOTAL) BY MOUTH AT BEDTIME.  90 tablet  3  . metoprolol (LOPRESSOR) 50 MG tablet Take 0.5 tablets (25 mg total) by mouth 2 (two) times daily.  60 tablet  3  . nitroGLYCERIN (NITROSTAT) 0.4 MG SL tablet Place 0.4 mg under the tongue every  5 (five) minutes as needed. For chest pain      . ranitidine (ZANTAC) 150 MG tablet Take 150 mg by mouth 2 (two) times daily.        . traMADol (ULTRAM) 50 MG tablet Take 50 mg by mouth every 6 (six) hours as needed.      . vitamin B-12 (CYANOCOBALAMIN) 1000 MCG tablet Take 1,000 mcg by mouth daily.      [name] DISCONTD: amLODipine (NORVASC) 5 MG tablet Take 5 mg by mouth daily.      [name] DISCONTD: metoprolol (LOPRESSOR) 50 MG tablet Take 50 mg by mouth 2 (two) times daily.       [name] DISCONTD: amLODipine (NORVASC) 5 MG tablet TAKE 1 TABLET DAILY  30 tablet  6    BP 158/72  Pulse 44  Ht 6\' 2"  (1.88 m)  Wt 215 lb 12.8 oz (97.886 kg)  BMI 27.71 kg/m2 General: NAD Neck: No JVD, no thyromegaly or thyroid nodule.  Lungs: Clear to auscultation bilaterally with normal respiratory effort. CV: Nondisplaced PMI.   Heart regular S1/S2, no S3, no murmur.  +S4.  No peripheral edema.  No carotid bruit.  Difficult to palpate PT pulses.  Abdomen: Soft, nontender, no hepatosplenomegaly, no distention.  Neurologic: Alert and oriented x 3.  Psych: Normal affect. Extremities: No clubbing or cyanosis.

## 2012-02-02 NOTE — Patient Instructions (Addendum)
Increase Amlodipine to 10mg  daily.  Decrease Metoprolol to 25mg  twice daily.  Keep a record of your blood pressure and pule for 2 weeks.  Thurston Hole will call you prior to August 2nd for the readings.  Start CoQ10 200mg  daily.  OTC  Your physician wants you to follow-up in: 6 months with Dr. Shirlee Latch. You will receive a reminder letter in the mail two months in advance. If you don't receive a letter, please call our office to schedule the follow-up appointment.  Labs today:  Lipid panel/BMET

## 2012-02-02 NOTE — Assessment & Plan Note (Signed)
HR 44 today, asymptomatic bradycardia.  He checks HR/BP frequently at home, HR usually in 50s.  I will decrease metoprolol to 25 mg bid.  I will have my nurse call him in 2 wks to make sure HR at home is not running lower than 50.

## 2012-02-02 NOTE — Assessment & Plan Note (Signed)
BP running high today.  As I am going to go down on metoprolol, I will increase amlodipine to 10 mg daily.  He will check his BP every day for the next 2 wks and my nurse will call to see what it is running with the medication changes.

## 2012-02-16 ENCOUNTER — Telehealth: Payer: Self-pay | Admitting: *Deleted

## 2012-02-16 NOTE — Telephone Encounter (Signed)
UNSPECIFIED ESSENTIAL HYPERTENSION - Marca Ancona, MD 02/02/2012 10:16 PM Signed  BP running high today. As I am going to go down on metoprolol, I will increase amlodipine to 10 mg daily. He will check his BP every day for the next 2 wks and my nurse will call to see what it is running with the medication changes.

## 2012-02-16 NOTE — Telephone Encounter (Signed)
Spoke with pt. He is aware of Dr Alford Highland recommendations.

## 2012-02-16 NOTE — Telephone Encounter (Signed)
02/16/12 recent BP readings--- 02/03/12 109/66 60  124/72 54  134/65 54  02/04/12 123/69 60  121/67 53 134/75 54 02/05/12 126/76 56 143/77 61 02/06/12 137/73 55 02/07/12 138/78 47 02/08/12 147/74 56 144/77 49 02/09/12 149/84 51 141/74 54 120/66 53 02/10/12 135/70 57 02/11/12 123/74  58 142/79 56 149/81 54 02/12/12 134/75 53 139/77 58 02/13/12 135/77 52 130/69 56 02/14/12 133/78 53 130/75 55 132/77 59 02/15/12 139/74 56 132/74 58 02/16/12 106/68 58. Metoprolol decreased to 25mg  twice a day amlodipine increase to 10mg  daily 02/02/12. Pt states his energy level is about the same but he thinks his heart rate is better. I will forward to Dr Shirlee Latch for review.

## 2012-02-16 NOTE — Telephone Encounter (Signed)
HR and BP ok, no changes

## 2012-08-01 ENCOUNTER — Encounter: Payer: Self-pay | Admitting: Cardiology

## 2012-08-01 ENCOUNTER — Ambulatory Visit (INDEPENDENT_AMBULATORY_CARE_PROVIDER_SITE_OTHER): Payer: Medicare Other | Admitting: Cardiology

## 2012-08-01 VITALS — BP 124/70 | HR 51 | Ht 74.0 in | Wt 217.0 lb

## 2012-08-01 DIAGNOSIS — E7800 Pure hypercholesterolemia, unspecified: Secondary | ICD-10-CM

## 2012-08-01 DIAGNOSIS — I498 Other specified cardiac arrhythmias: Secondary | ICD-10-CM

## 2012-08-01 DIAGNOSIS — R001 Bradycardia, unspecified: Secondary | ICD-10-CM

## 2012-08-01 DIAGNOSIS — I10 Essential (primary) hypertension: Secondary | ICD-10-CM

## 2012-08-01 DIAGNOSIS — I2510 Atherosclerotic heart disease of native coronary artery without angina pectoris: Secondary | ICD-10-CM

## 2012-08-01 NOTE — Patient Instructions (Addendum)
Your physician recommends that you have a FASTING lipid profile/BMET. I have given you an order for this. Please fax the results to [phone].  Your physician wants you to follow-up in: 1 year with Dr Shirlee Latch. Sherrie Mustache 2015). You will receive a reminder letter in the mail two months in advance. If you don't receive a letter, please call our office to schedule the follow-up appointment.

## 2012-08-01 NOTE — Progress Notes (Signed)
Patient ID: Joseph [name], male   DOB: 21-Mar-1941, 72 y.o.   MRN: [id number] PCP: [name] ([place])  72 yo with history of CAD s/p anterior MI in 2004 and LAD/RCA PCIs presents for cardiology followup.   He had a nonischemic myoview in 1/13.  Since then, he has been doing well.  No chest pain, no exertional dyspnea.  He is not walking as much as in the past due to low back pain and a bone spur in his foot.  He is still able to walk a mile with no trouble, and he uses his chainsaw frequently.  He has some mild aching in his thighs that may be due to his statin.  Was worse with other statins than with lovastatin.  Labs (3/11): LDL 90, HDL 40, K 4.4, creatinine 1.0 Labs (4/11): K 5.1, creatinine 1.4 Labs (9/11): K 4.5, creatinine 0.97, LDL 83, HDL 35 Labs (3/12): HDL 29, LDL 74, K 4.6, creatinine 1.1 Labs (9/12): LDL 60, HDL 36 Labs (1/13): Cardiac enzymes negative, K 5.3, creatinine 1.28  ECG: sinus bradycardia, 1st degree AV block, old ASMI  Past Medical History: 1. 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  s/p back surgery.  2. Diabetes mellitus 3. Hypertension: had side effects with chlorthalidone.  4. Hyperlipidemia: intolerant to multiple statins due to muscle pain.  He is able to tolerate lovastatin.  5. CAD: Anterior MI in 2004.  Patient had LAD and RCA PCI at the time.  Last myoview was in 7/07: EF 62%, small area of anteroapical infarct.  Echo (3/11): EF 60%, normal wall motion, mild MR, mild left atrial enlargement, mildly dilated ascending aorta.   Myoview (1/13): Small fixed apical septal defect with no ischemia, EF 58%.  6.  Arterial dopplers (3/11): no evidence for significant PAD. ABIs 10/12: Normal.  7.  Allergic rhinitis 8.  Abdominal discomfort: EGD 2011 showed no significant abnormalities.  Possibly due to metformin.  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  Carotid dopplers in 1/13 showed minimal disease.    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  Tobacco Use - No.  Alcohol Use - no Drug Use - no  Current Outpatient Prescriptions  Medication Sig Dispense Refill  . amLODipine (NORVASC) 10 MG tablet Take 1 tablet (10 mg total) by mouth daily.  90 tablet  3  . aspirin 81 MG tablet Take 162 mg by mouth daily.       . Coenzyme Q10 (CO Q 10 PO) Take by mouth daily.      . fish oil-omega-3 fatty acids 1000 MG capsule Take 2 g by mouth as directed.      [name] glipiZIDE (GLUCOTROL) 10 MG tablet Take 10 mg by mouth 2 (two) times daily before a meal.        . Liraglutide (VICTOZA) 18 MG/3ML SOLN Inject 1.8 mg into the skin.      [name] lisinopril (PRINIVIL,ZESTRIL) 40 MG tablet Take 40 mg by mouth daily.        [name] lovastatin (MEVACOR) 20 MG tablet TAKE 1 TABLET (20 MG TOTAL) BY MOUTH AT BEDTIME.  90 tablet  3  . metoprolol (LOPRESSOR) 50 MG tablet Take 0.5 tablets (25 mg total) by mouth 2 (two) times daily.  60 tablet  3  . nitroGLYCERIN (NITROSTAT) 0.4 MG SL tablet Place 0.4 mg under the  tongue every 5 (five) minutes as needed. For chest pain      . ranitidine (ZANTAC) 150 MG tablet Take 150 mg by mouth 2 (two) times daily.        . traMADol (ULTRAM) 50 MG tablet Take 50 mg by mouth every 6 (six) hours as needed.      . vitamin B-12 (CYANOCOBALAMIN) 1000 MCG tablet Take 1,000 mcg by mouth daily.        BP 124/70  Pulse 51  Ht 6\' 2"  (1.88 m)  Wt 217 lb (98.431 kg)  BMI 27.86 kg/m2 General: NAD Neck: No JVD, no thyromegaly or thyroid nodule.  Lungs: Clear to auscultation bilaterally with normal respiratory effort. CV: Nondisplaced PMI.  Heart regular S1/S2, no S3, no murmur.  +S4.  No peripheral edema.  No carotid bruit.  Difficult to palpate PT pulses.  Abdomen: Soft, nontender, no hepatosplenomegaly, no distention.  Neurologic: Alert and oriented x 3.  Psych: Normal affect. Extremities: No clubbing or  cyanosis.   Assessment/Plan:  Bradycardia  HR stable in the 50s on current dose of metoprolol.  CAD  Non-ischemic myoview in 1/13.  No chest pain or exertional dyspnea. Continue ASA 81, statin, ACEI, metoprolol.  HYPERTENSION  BP is well-controlled.  PURE HYPERCHOLESTEROLEMIA Tolerating lovastatin with some mild myalgias (much better than other statins). Continue coenzyme Q10 200 mg daily. He will get lipids today.  Marca Ancona 08/01/2012

## 2012-08-04 NOTE — Addendum Note (Signed)
Addended by: Micki Riley C on: 08/04/2012 02:32 PM   Modules accepted: Orders

## 2012-08-16 ENCOUNTER — Telehealth: Payer: Self-pay | Admitting: Cardiology

## 2012-08-16 NOTE — Telephone Encounter (Signed)
New Problem    Returning phone call regarding labs

## 2012-08-16 NOTE — Telephone Encounter (Signed)
Spoke with pt about lab results.

## 2012-11-23 DIAGNOSIS — K317 Polyp of stomach and duodenum: Secondary | ICD-10-CM | POA: Insufficient documentation

## 2013-02-02 ENCOUNTER — Other Ambulatory Visit: Payer: Self-pay | Admitting: Cardiology

## 2013-08-03 ENCOUNTER — Ambulatory Visit: Payer: BLUE CROSS/BLUE SHIELD | Admitting: Cardiology

## 2013-08-20 ENCOUNTER — Other Ambulatory Visit: Payer: Self-pay | Admitting: Cardiology

## 2013-08-30 ENCOUNTER — Encounter: Payer: Self-pay | Admitting: Cardiology

## 2013-08-30 ENCOUNTER — Ambulatory Visit (INDEPENDENT_AMBULATORY_CARE_PROVIDER_SITE_OTHER): Payer: Medicare Other | Admitting: Cardiology

## 2013-08-30 VITALS — BP 138/68 | HR 56 | Ht 74.0 in | Wt 212.0 lb

## 2013-08-30 DIAGNOSIS — E7800 Pure hypercholesterolemia, unspecified: Secondary | ICD-10-CM

## 2013-08-30 DIAGNOSIS — I6350 Cerebral infarction due to unspecified occlusion or stenosis of unspecified cerebral artery: Secondary | ICD-10-CM

## 2013-08-30 DIAGNOSIS — I2510 Atherosclerotic heart disease of native coronary artery without angina pectoris: Secondary | ICD-10-CM

## 2013-08-30 DIAGNOSIS — I639 Cerebral infarction, unspecified: Secondary | ICD-10-CM

## 2013-08-30 LAB — BASIC METABOLIC PANEL
BUN: 21 mg/dL (ref 6–23)
CALCIUM: 9.7 mg/dL (ref 8.4–10.5)
CO2: 26 mEq/L (ref 19–32)
CREATININE: 1.3 mg/dL (ref 0.4–1.5)
Chloride: 107 mEq/L (ref 96–112)
GFR: 59.86 mL/min — AB (ref >60.00)
Glucose, Bld: 117 mg/dL — ABNORMAL HIGH (ref 70–99)
Potassium: 4.4 mEq/L (ref 3.5–5.1)
Sodium: 141 mEq/L (ref 135–145)

## 2013-08-30 LAB — LIPID PANEL
CHOLESTEROL: 131 mg/dL (ref 0–200)
HDL: 36.3 mg/dL — ABNORMAL LOW (ref >39.00)
LDL CALC: 71 mg/dL (ref 0–99)
Total CHOL/HDL Ratio: 4
Triglycerides: 120 mg/dL (ref 0.0–149.0)
VLDL: 24 mg/dL (ref 0.0–40.0)

## 2013-08-30 NOTE — Patient Instructions (Signed)
Your physician recommends that you continue on your current medications as directed. Please refer to the Current Medication list given to you today.  Your physician recommends that have labs today: BMET, Lipid  Your physician wants you to follow-up in: 1 year with Dr. Earlean ShawlMcLean You will receive a reminder letter in the mail two months in advance. If you don't receive a letter, please call our office to schedule the follow-up appointment.

## 2013-08-31 NOTE — Progress Notes (Signed)
Patient ID: Joseph [name], male   DOB: 20-Nov-1940, 73 y.o.   MRN: [id number] PCP: [name] ([place])  73 yo with history of CAD s/p anterior MI in 2004 and LAD/RCA PCIs presents for cardiology followup.   He had a nonischemic myoview in 1/13.  Since then, he has been doing well.  No chest pain, no exertional dyspnea.  He is still able to walk a mile with no trouble, rides his stationary bike, and uses his chainsaw frequently. 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  He has cut his lovastatin in half because of muscle pain.  This seems to have helped, and pain is tolerable at the current level.   Labs (3/11): LDL 90, HDL 40, K 4.4, creatinine 1.0 Labs (4/11): K 5.1, creatinine 1.4 Labs (9/11): K 4.5, creatinine 0.97, LDL 83, HDL 35 Labs (3/12): HDL 29, LDL 74, K 4.6, creatinine 1.1 Labs (9/12): LDL 60, HDL 36 Labs (1/13): Cardiac enzymes negative, K 5.3, creatinine 1.28 Labs (2/14): K 4.9, creatinine 1.4, LDL 61, HDL 29  ECG: sinus bradycardia, 1st degree AV block, old ASMI  Past Medical History: 1. 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  s/p back surgery.  2. Diabetes mellitus 3. Hypertension: had side effects with chlorthalidone.  4. Hyperlipidemia: intolerant to multiple statins due to muscle pain.  He is able to tolerate lovastatin.  5. CAD: Anterior MI in 2004.  Patient had LAD and RCA PCI at the time.  Last myoview was in 7/07: EF 62%, small area of anteroapical infarct.  Echo (3/11): EF 60%, normal wall motion, mild MR, mild left atrial enlargement, mildly dilated ascending aorta.   Myoview (1/13): Small fixed apical septal defect with no ischemia, EF 58%.  6.  Arterial dopplers (3/11): no evidence for significant PAD. ABIs 10/12: Normal.  7.  Allergic rhinitis 8.  Abdominal discomfort: EGD 2011 showed no significant abnormalities.  Possibly due to metformin.  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  Carotid dopplers in 1/13 showed minimal disease.   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  Tobacco Use - No.  Alcohol Use - no Drug Use - no  Current Outpatient Prescriptions  Medication Sig Dispense Refill  . amLODipine (NORVASC) 10 MG tablet TAKE ONE TABLET BY MOUTH ONCE DAILY  90 tablet  1  . aspirin 81 MG tablet Take 162 mg by mouth daily.       . Coenzyme Q10 (CO Q 10 PO) Take by mouth daily.      . fish oil-omega-3 fatty acids 1000 MG capsule Take 1 g by mouth as directed.       [name] glipiZIDE (GLUCOTROL) 10 MG tablet Take 10 mg by mouth 2 (two) times daily before a meal.        . LEVEMIR FLEXTOUCH 100 UNIT/ML Pen 15 Units daily.      [name] lisinopril (PRINIVIL,ZESTRIL) 40 MG tablet Take 40 mg by mouth daily.        [name] lovastatin (MEVACOR) 20 MG tablet TAKE 1/2 TABLET (10 MG TOTAL) BY MOUTH AT BEDTIME.      . metoprolol (LOPRESSOR) 50 MG tablet Take 0.5 tablets (25 mg total) by mouth 2 (two) times daily.  60 tablet  3  . nitroGLYCERIN (NITROSTAT) 0.4 MG SL tablet Place 0.4 mg under the tongue every  5 (five) minutes as needed. For chest pain      . ranitidine (ZANTAC) 150 MG tablet Take 150 mg by mouth 2 (two) times daily.        . vitamin B-12 (CYANOCOBALAMIN) 1000 MCG tablet Take 1,000 mcg by mouth daily.       No current facility-administered medications for this visit.    BP 138/68  Pulse 56  Ht 6\' 2"  (1.88 m)  Wt 96.163 kg (212 lb)  BMI 27.21 kg/m2 General: NAD Neck: No JVD, no thyromegaly or thyroid nodule.  Lungs: Clear to auscultation bilaterally with normal respiratory effort. CV: Nondisplaced PMI.  Heart regular S1/S2, no S3, no murmur.  +S4.  No peripheral edema.  No carotid bruit.  Difficult to palpate PT pulses.  Abdomen: Soft, nontender, no hepatosplenomegaly, no distention.  Neurologic: Alert and oriented x 3.  Psych: Normal affect. Extremities: No clubbing or  cyanosis.   Assessment/Plan:  Bradycardia  HR stable in the 50s on current dose of metoprolol.  CAD  Non-ischemic myoview in 1/13.  No chest pain or exertional dyspnea. Continue ASA 81, statin, ACEI, metoprolol.  HYPERTENSION  BP is well-controlled.  PURE HYPERCHOLESTEROLEMIA He has cut back on his lovastatin due to myalgias.  He is now taking 10 mg daily.  I will check lipids, and if LDL is signficantly elevated, I will start him on Zetia in addition to lovastatin.  Marca AnconaDalton Karine Garn 08/31/2013

## 2013-09-10 DIAGNOSIS — L209 Atopic dermatitis, unspecified: Secondary | ICD-10-CM | POA: Insufficient documentation

## 2014-07-09 DIAGNOSIS — H6123 Impacted cerumen, bilateral: Secondary | ICD-10-CM | POA: Insufficient documentation

## 2014-08-02 ENCOUNTER — Encounter (HOSPITAL_COMMUNITY): Payer: Self-pay | Admitting: Physical Medicine and Rehabilitation

## 2014-08-02 ENCOUNTER — Inpatient Hospital Stay (HOSPITAL_COMMUNITY)
Admission: EM | Admit: 2014-08-02 | Discharge: 2014-08-03 | DRG: 247 | Disposition: A | Discharge status: Home / Self Care | Payer: Medicare Other | Attending: Internal Medicine | Admitting: Internal Medicine

## 2014-08-02 ENCOUNTER — Telehealth: Payer: Self-pay | Admitting: Cardiology

## 2014-08-02 ENCOUNTER — Encounter (HOSPITAL_COMMUNITY)
Admission: EM | Disposition: A | Discharge status: Home / Self Care | Payer: BLUE CROSS/BLUE SHIELD | Source: Home / Self Care | Attending: Internal Medicine

## 2014-08-02 ENCOUNTER — Emergency Department (HOSPITAL_COMMUNITY): Payer: Medicare Other

## 2014-08-02 DIAGNOSIS — R079 Chest pain, unspecified: Secondary | ICD-10-CM | POA: Diagnosis present

## 2014-08-02 DIAGNOSIS — E118 Type 2 diabetes mellitus with unspecified complications: Secondary | ICD-10-CM

## 2014-08-02 DIAGNOSIS — Z79899 Other long term (current) drug therapy: Secondary | ICD-10-CM

## 2014-08-02 DIAGNOSIS — E785 Hyperlipidemia, unspecified: Secondary | ICD-10-CM | POA: Diagnosis present

## 2014-08-02 DIAGNOSIS — E7800 Pure hypercholesterolemia, unspecified: Secondary | ICD-10-CM | POA: Diagnosis present

## 2014-08-02 DIAGNOSIS — T82857A Stenosis of cardiac prosthetic devices, implants and grafts, initial encounter: Secondary | ICD-10-CM | POA: Diagnosis present

## 2014-08-02 DIAGNOSIS — I2510 Atherosclerotic heart disease of native coronary artery without angina pectoris: Secondary | ICD-10-CM | POA: Diagnosis present

## 2014-08-02 DIAGNOSIS — I25110 Atherosclerotic heart disease of native coronary artery with unstable angina pectoris: Secondary | ICD-10-CM

## 2014-08-02 DIAGNOSIS — Z9861 Coronary angioplasty status: Secondary | ICD-10-CM

## 2014-08-02 DIAGNOSIS — I252 Old myocardial infarction: Secondary | ICD-10-CM | POA: Diagnosis not present

## 2014-08-02 DIAGNOSIS — I200 Unstable angina: Secondary | ICD-10-CM

## 2014-08-02 DIAGNOSIS — I214 Non-ST elevation (NSTEMI) myocardial infarction: Principal | ICD-10-CM

## 2014-08-02 DIAGNOSIS — Z7982 Long term (current) use of aspirin: Secondary | ICD-10-CM

## 2014-08-02 DIAGNOSIS — E119 Type 2 diabetes mellitus without complications: Secondary | ICD-10-CM | POA: Diagnosis present

## 2014-08-02 DIAGNOSIS — I10 Essential (primary) hypertension: Secondary | ICD-10-CM | POA: Diagnosis present

## 2014-08-02 DIAGNOSIS — Z955 Presence of coronary angioplasty implant and graft: Secondary | ICD-10-CM

## 2014-08-02 DIAGNOSIS — I209 Angina pectoris, unspecified: Secondary | ICD-10-CM | POA: Insufficient documentation

## 2014-08-02 DIAGNOSIS — Z981 Arthrodesis status: Secondary | ICD-10-CM

## 2014-08-02 DIAGNOSIS — I208 Other forms of angina pectoris: Secondary | ICD-10-CM | POA: Insufficient documentation

## 2014-08-02 HISTORY — DX: Personal history of transient ischemic attack (TIA), and cerebral infarction without residual deficits: Z86.73

## 2014-08-02 HISTORY — DX: Unspecified abdominal pain: R10.9

## 2014-08-02 HISTORY — DX: Personal history of other medical treatment: Z92.89

## 2014-08-02 HISTORY — PX: LEFT HEART CATHETERIZATION WITH CORONARY ANGIOGRAM: SHX5451

## 2014-08-02 LAB — CBC WITH DIFFERENTIAL/PLATELET
Basophils Absolute: 0.1 10*3/uL (ref 0.0–0.1)
Basophils Relative: 1 % (ref 0–1)
EOS ABS: 0.3 10*3/uL (ref 0.0–0.7)
EOS PCT: 4 % (ref 0–5)
HEMATOCRIT: 40.3 % (ref 39.0–52.0)
HEMOGLOBIN: 14.3 g/dL (ref 13.0–17.0)
LYMPHS ABS: 2.1 10*3/uL (ref 0.7–4.0)
LYMPHS PCT: 26 % (ref 12–46)
MCH: 31.4 pg (ref 26.0–34.0)
MCHC: 35.5 g/dL (ref 30.0–36.0)
MCV: 88.4 fL (ref 78.0–100.0)
Monocytes Absolute: 0.5 10*3/uL (ref 0.1–1.0)
Monocytes Relative: 6 % (ref 3–12)
NEUTROS PCT: 63 % (ref 43–77)
Neutro Abs: 5.1 10*3/uL (ref 1.7–7.7)
PLATELETS: 249 10*3/uL (ref 150–400)
RBC: 4.56 MIL/uL (ref 4.22–5.81)
RDW: 12.3 % (ref 11.5–15.5)
WBC: 8.1 10*3/uL (ref 4.0–10.5)

## 2014-08-02 LAB — COMPREHENSIVE METABOLIC PANEL
ALBUMIN: 4.6 g/dL (ref 3.5–5.2)
ALT: 23 U/L (ref 0–53)
AST: 24 U/L (ref 0–37)
Alkaline Phosphatase: 68 U/L (ref 39–117)
Anion gap: 9 (ref 5–15)
BILIRUBIN TOTAL: 0.5 mg/dL (ref 0.3–1.2)
BUN: 25 mg/dL — ABNORMAL HIGH (ref 6–23)
CALCIUM: 9.5 mg/dL (ref 8.4–10.5)
CO2: 24 mmol/L (ref 19–32)
Chloride: 104 mEq/L (ref 96–112)
Creatinine, Ser: 1.24 mg/dL (ref 0.50–1.35)
GFR calc Af Amer: 64 mL/min — ABNORMAL LOW (ref >90)
GFR calc non Af Amer: 55 mL/min — ABNORMAL LOW (ref >90)
Glucose, Bld: 206 mg/dL — ABNORMAL HIGH (ref 70–99)
POTASSIUM: 4.6 mmol/L (ref 3.5–5.1)
Sodium: 137 mmol/L (ref 135–145)
Total Protein: 6.9 g/dL (ref 6.0–8.3)

## 2014-08-02 LAB — TSH: TSH: 1.633 u[IU]/mL (ref 0.350–4.500)

## 2014-08-02 LAB — TROPONIN I: Troponin I: 0.29 ng/mL — ABNORMAL HIGH (ref <0.031)

## 2014-08-02 LAB — I-STAT TROPONIN, ED: TROPONIN I, POC: 0.11 ng/mL — AB (ref 0.00–0.08)

## 2014-08-02 LAB — GLUCOSE, CAPILLARY: Glucose-Capillary: 86 mg/dL (ref 70–99)

## 2014-08-02 LAB — CBG MONITORING, ED: Glucose-Capillary: 110 mg/dL — ABNORMAL HIGH (ref 70–99)

## 2014-08-02 SURGERY — LEFT HEART CATHETERIZATION WITH CORONARY ANGIOGRAM
Anesthesia: LOCAL

## 2014-08-02 MED ORDER — HEPARIN SODIUM (PORCINE) 1000 UNIT/ML IJ SOLN
INTRAMUSCULAR | Status: AC
  Filled 2014-08-02: qty 1

## 2014-08-02 MED ORDER — SODIUM CHLORIDE 0.9 % IJ SOLN: 3.0000 mL | Freq: Two times a day (BID) | INTRAMUSCULAR | Status: DC

## 2014-08-02 MED ORDER — ASPIRIN 81 MG PO TABS: 162.0000 mg | ORAL_TABLET | Freq: Every day | ORAL | Status: DC

## 2014-08-02 MED ORDER — BIVALIRUDIN 250 MG IV SOLR
0.2500 mg/kg/h | INTRAVENOUS | Status: DC
  Administered 2014-08-02: 0.25 mg/kg/h via INTRAVENOUS
  Filled 2014-08-02: qty 250

## 2014-08-02 MED ORDER — VITAMIN B-12 1000 MCG PO TABS
1000.0000 ug | ORAL_TABLET | Freq: Every day | ORAL | Status: DC
  Administered 2014-08-03: 11:00:00 1000 ug via ORAL
  Filled 2014-08-02: qty 1

## 2014-08-02 MED ORDER — SODIUM CHLORIDE 0.9 % IV SOLN
0.2500 mg/kg/h | INTRAVENOUS | Status: AC
  Filled 2014-08-02: qty 250

## 2014-08-02 MED ORDER — AMLODIPINE BESYLATE 10 MG PO TABS
10.0000 mg | ORAL_TABLET | Freq: Every day | ORAL | Status: DC
  Administered 2014-08-03: 11:00:00 10 mg via ORAL
  Filled 2014-08-02: qty 1

## 2014-08-02 MED ORDER — ACETAMINOPHEN 325 MG PO TABS: 650.0000 mg | ORAL_TABLET | ORAL | Status: DC | PRN

## 2014-08-02 MED ORDER — INSULIN DETEMIR 100 UNIT/ML FLEXPEN: 15.0000 [IU] | PEN_INJECTOR | Freq: Every day | SUBCUTANEOUS | Status: DC

## 2014-08-02 MED ORDER — LIDOCAINE HCL (PF) 1 % IJ SOLN
INTRAMUSCULAR | Status: AC
  Filled 2014-08-02: qty 30

## 2014-08-02 MED ORDER — TICAGRELOR 90 MG PO TABS
ORAL_TABLET | ORAL | Status: AC
  Filled 2014-08-02: qty 2

## 2014-08-02 MED ORDER — NITROGLYCERIN 1 MG/10 ML FOR IR/CATH LAB
INTRA_ARTERIAL | Status: AC
  Filled 2014-08-02: qty 10

## 2014-08-02 MED ORDER — ALPRAZOLAM 0.25 MG PO TABS: 0.2500 mg | ORAL_TABLET | Freq: Two times a day (BID) | ORAL | Status: DC | PRN

## 2014-08-02 MED ORDER — ASPIRIN 81 MG PO CHEW: 162.0000 mg | CHEWABLE_TABLET | Freq: Every day | ORAL | Status: DC

## 2014-08-02 MED ORDER — PRAVASTATIN SODIUM 20 MG PO TABS
20.0000 mg | ORAL_TABLET | Freq: Every day | ORAL | Status: DC
  Filled 2014-08-02 (×2): qty 1

## 2014-08-02 MED ORDER — OMEGA-3-ACID ETHYL ESTERS 1 G PO CAPS
1.0000 g | ORAL_CAPSULE | Freq: Every day | ORAL | Status: DC
  Administered 2014-08-03: 11:00:00 1 g via ORAL
  Filled 2014-08-02: qty 1

## 2014-08-02 MED ORDER — TICAGRELOR 90 MG PO TABS
90.0000 mg | ORAL_TABLET | Freq: Two times a day (BID) | ORAL | Status: DC
  Administered 2014-08-03: 11:00:00 90 mg via ORAL
  Filled 2014-08-02 (×3): qty 1

## 2014-08-02 MED ORDER — ONDANSETRON HCL 4 MG/2ML IJ SOLN: 4.0000 mg | Freq: Four times a day (QID) | INTRAMUSCULAR | Status: DC | PRN

## 2014-08-02 MED ORDER — INSULIN DETEMIR 100 UNIT/ML ~~LOC~~ SOLN
15.0000 [IU] | Freq: Every day | SUBCUTANEOUS | Status: DC
  Administered 2014-08-03: 15 [IU] via SUBCUTANEOUS
  Filled 2014-08-02: qty 0.15

## 2014-08-02 MED ORDER — GLIPIZIDE 10 MG PO TABS
10.0000 mg | ORAL_TABLET | Freq: Two times a day (BID) | ORAL | Status: DC
  Administered 2014-08-03: 07:00:00 10 mg via ORAL
  Filled 2014-08-02 (×4): qty 1

## 2014-08-02 MED ORDER — SODIUM CHLORIDE 0.9 % IV SOLN: 250.0000 mL | INTRAVENOUS | Status: DC | PRN

## 2014-08-02 MED ORDER — FAMOTIDINE 20 MG PO TABS
20.0000 mg | ORAL_TABLET | Freq: Two times a day (BID) | ORAL | Status: DC
Start: 2014-08-02 — End: 2014-08-03
  Administered 2014-08-02 – 2014-08-03 (×2): 20 mg via ORAL
  Filled 2014-08-02 (×4): qty 1

## 2014-08-02 MED ORDER — MIDAZOLAM HCL 2 MG/2ML IJ SOLN
INTRAMUSCULAR | Status: AC
  Filled 2014-08-02: qty 2

## 2014-08-02 MED ORDER — NITROGLYCERIN 0.4 MG SL SUBL
0.4000 mg | SUBLINGUAL_TABLET | SUBLINGUAL | Status: DC | PRN
Start: 2014-08-02 — End: 2014-08-03

## 2014-08-02 MED ORDER — SODIUM CHLORIDE 0.9 % IJ SOLN: 3.0000 mL | INTRAMUSCULAR | Status: DC | PRN

## 2014-08-02 MED ORDER — ASPIRIN 81 MG PO CHEW
81.0000 mg | CHEWABLE_TABLET | Freq: Every day | ORAL | Status: DC
  Administered 2014-08-03: 81 mg via ORAL
  Filled 2014-08-02: qty 1

## 2014-08-02 MED ORDER — FENTANYL CITRATE 0.05 MG/ML IJ SOLN
INTRAMUSCULAR | Status: AC
  Filled 2014-08-02: qty 2

## 2014-08-02 MED ORDER — BIVALIRUDIN 250 MG IV SOLR
INTRAVENOUS | Status: AC
  Filled 2014-08-02: qty 250

## 2014-08-02 MED ORDER — OMEGA-3 FATTY ACIDS 1000 MG PO CAPS: 1.0000 g | ORAL_CAPSULE | Freq: Every day | ORAL | Status: DC

## 2014-08-02 MED ORDER — LISINOPRIL 40 MG PO TABS
40.0000 mg | ORAL_TABLET | Freq: Every day | ORAL | Status: DC
  Administered 2014-08-03: 40 mg via ORAL
  Filled 2014-08-02: qty 1

## 2014-08-02 MED ORDER — INSULIN ASPART 100 UNIT/ML ~~LOC~~ SOLN
0.0000 [IU] | Freq: Three times a day (TID) | SUBCUTANEOUS | Status: DC
  Administered 2014-08-03: 07:00:00 2 [IU] via SUBCUTANEOUS

## 2014-08-02 MED ORDER — HEPARIN (PORCINE) IN NACL 2-0.9 UNIT/ML-% IJ SOLN
INTRAMUSCULAR | Status: AC
  Filled 2014-08-02: qty 1000

## 2014-08-02 MED ORDER — ZOLPIDEM TARTRATE 5 MG PO TABS: 5.0000 mg | ORAL_TABLET | Freq: Every evening | ORAL | Status: DC | PRN

## 2014-08-02 MED ORDER — SODIUM CHLORIDE 0.9 % IV SOLN: 1.0000 mL/kg/h | INTRAVENOUS | Status: AC

## 2014-08-02 MED ORDER — HEPARIN BOLUS VIA INFUSION
4000.0000 [IU] | Freq: Once | INTRAVENOUS | Status: AC
  Administered 2014-08-02: 4000 [IU] via INTRAVENOUS
  Filled 2014-08-02: qty 4000

## 2014-08-02 MED ORDER — HEPARIN (PORCINE) IN NACL 100-0.45 UNIT/ML-% IJ SOLN
1000.0000 [IU]/h | INTRAMUSCULAR | Status: DC
  Administered 2014-08-02: 1000 [IU]/h via INTRAVENOUS
  Filled 2014-08-02: qty 250

## 2014-08-02 MED ORDER — METOPROLOL TARTRATE 25 MG PO TABS
25.0000 mg | ORAL_TABLET | Freq: Two times a day (BID) | ORAL | Status: DC
  Filled 2014-08-02 (×2): qty 1

## 2014-08-02 MED ORDER — VERAPAMIL HCL 2.5 MG/ML IV SOLN
INTRAVENOUS | Status: AC
  Filled 2014-08-02: qty 2

## 2014-08-02 MED ORDER — ASPIRIN 81 MG PO CHEW
243.0000 mg | CHEWABLE_TABLET | Freq: Once | ORAL | Status: AC
  Administered 2014-08-02: 243 mg via ORAL
  Filled 2014-08-02: qty 3

## 2014-08-02 NOTE — ED Notes (Signed)
Troponin results shown to Dr. Rubin PayorPickering and given to the charge nurse Traci

## 2014-08-02 NOTE — Progress Notes (Signed)
To room 9 by stretcher.

## 2014-08-02 NOTE — ED Notes (Signed)
Pt presents to department for evaluation of L sided chest pain and SOB. Onset Tuesday morning. 1/10 pain upon arrival. Respirations unlabored. Pt is alert and oriented x4.

## 2014-08-02 NOTE — Progress Notes (Addendum)
Dr. Eldridge DaceVaranasi at bedside talking w/patient. Family in room. Consent signed and witnessed for cardiac cath.

## 2014-08-02 NOTE — Interval H&P Note (Signed)
Cath Lab Visit (complete for each Cath Lab visit)  Clinical Evaluation Leading to the Procedure:   ACS: Yes.    Non-ACS:    Anginal Classification: CCS IV  Anti-ischemic medical therapy: Minimal Therapy (1 class of medications)  Non-Invasive Test Results: No non-invasive testing performed  Prior CABG: No previous CABG  TIMI SCORE  Patient Information:  TIMI Score is 5   A/NSTEMI and high-risk features for short-term risk of death or nonfatal MI Revascularization of the presumed culprit artery  A (9)  Indication: 11; Score: 9 TIMI SCORE  Patient Information:  TIMI Score is 5   A/NSTEMI and high-risk features for short-term risk of death or nonfatal MI Revascularization of multiple coronary arteries when the culprit artery cannot clearly be determined  A (9)  Indication: 12; Score: 9     History and Physical Interval Note:  08/02/2014 6:02 PM  Joseph [name]  has presented today for surgery, with the diagnosis of c/p  The various methods of treatment have been discussed with the patient and family. After consideration of risks, benefits and other options for treatment, the patient has consented to  Procedure(s): LEFT HEART CATHETERIZATION WITH CORONARY ANGIOGRAM (N/A) as a surgical intervention .  The patient's history has been reviewed, patient examined, no change in status, stable for surgery.  I have reviewed the patient's chart and labs.  Questions were answered to the patient's satisfaction.     Joseph [name] S.

## 2014-08-02 NOTE — ED Notes (Signed)
Patient transported to X-ray 

## 2014-08-02 NOTE — Telephone Encounter (Signed)
Pt states he woke up this past Tuesday with tightness in his chest over his heart, associated with SOB and belching.  Pt states this continued on and off for entire day.  He woke up Wednesday AM and did not have the chest tightness. Pt states off and on since Wednesday he has chest tightness with any physical exertion. Pt states the chest tightness is shortly relieved with stopping the activity.  Pt states he is asymptomatic right now but did have some chest tightness earlier this morning walking up a hill. I reviewed with Herschel Senegalhris Berge,NP--he recommended pt report to ED now for further evaluation, pt should not drive but OK to have someone drive him as long as he is asymptomatic.   Pt advised, verbalized understanding, agreed with plan.

## 2014-08-02 NOTE — Telephone Encounter (Signed)
New message    Pt c/o of Chest Pain: STAT if CP now or developed within 24 hours  1. Are you having CP right now?  No when walking tightness in chest / weak.   2. Are you experiencing any other symptoms (ex. SOB, nausea, vomiting, sweating)? No   3. How long have you been experiencing CP? Last Tuesday all day - no er visit .   4. Is your CP continuous or coming and going? Happening since last Tuesday   5. Have you taken Nitroglycerin? Has the medication /did not take meds.   ?

## 2014-08-02 NOTE — H&P (Signed)
Primary MD: Dema SeverinYORK,REGINA F, NP Cardiologist: Dr. Shirlee LatchMcLean  Chief Complaint:  HPI:  Joseph [name] is a [age] y.o. male with a history of CAD.   2004 - anterior MI with stent to the prox LAD, staged PCI of the RCA w/ Taxus stent, EF  55%.  2007 Prior to back surgery had nuc stress, scar, no ischemia  2013 Nuc stress w/ scar, no ischemia, EF 58%  08/2013 - last OV w/ DM, doing well, probs tolerating statins, doing OK with lovastatin  07/30/2014 - woke up with left-sided chest pain, 8/10, lasted all day. He tried GI medications because he thought it was indigestion, but they did not help. Still there when he went to bed but was pain-free when he woke up.  Since then, he has been consistently getting chest pain and dyspnea with exertion. This is new for him. Even walking relatively short distances, he will have discomfort. Today, he had chest pain with walking from the barn back to the house. He decided it was time to get looked at and came to the ER. His ECG is not acute and he is currently pain-free.  Review of Systems:     Cardiac Review of Systems: {Y] = yes [ ]  = no  Chest Pain [ y   ]  Resting SOB [   ] Exertional SOB  [ y ]  Orthopnea [  ]   Pedal Edema [   ]    Palpitations [  ] Syncope  [  ]   Presyncope [   ]  General Review of Systems: [Y] = yes [  ]=no Constitional: recent weight change [  ]; anorexia [  ]; fatigue [  ]; nausea [  ]; night sweats [  ]; fever [  ]; or chills [  ];                                                                                                                                          Dental: poor dentition[  ];    Eye : blurred vision [  ]; diplopia [   ]; vision changes [  ];  Amaurosis fugax[  ]; Resp: cough [  ];  wheezing[  ];  hemoptysis[  ]; shortness of breath[  ]; paroxysmal nocturnal dyspnea[  ]; dyspnea on exertion[ y ]; or orthopnea[  ];  GI:  gallstones[  ], vomiting[  ];  dysphagia[  ]; melena[  ];  hematochezia [  ]; heartburn[ y ];    Hx of  Colonoscopy[  ]; GU: kidney stones [  ]; hematuria[  ];   dysuria [  ];  nocturia[  ];  history of     obstruction [  ];                 Skin: rash, swelling[  ];, hair loss[  ];  peripheral edema[  ];  or itching[  ]; Musculosketetal: myalgias[  ];  joint swelling[  ];  joint erythema[  ];  joint pain[  ];  back pain[  ];  Heme/Lymph: bruising[  ];  bleeding[  ];  anemia[  ];  Neuro: TIA[  ];  headaches[  ];  stroke[  ];  vertigo[  ];  seizures[  ];   paresthesias[  ];  difficulty walking[  ];  Psych:depression[  ]; anxiety[  ];  Endocrine: diabetes[  ];  thyroid dysfunction[  ];  Immunizations: Flu [  ]; Pneumococcal[  ];  Other:  Past Medical History  Diagnosis Date  . 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 DDD (degenerative disc disease)   . DM (diabetes mellitus)   . HTN (hypertension)   . HLD (hyperlipidemia)   . Allergic rhinitis   . MI (myocardial infarction) 2004    stents to the LAD and staged stent RCA, EF 55%   Past Surgical History  Procedure Laterality Date  . Coronary stent placement  2004    x2 LAD and RCA   . Prostate surgery    . Spinal fusion      L4-S1   Prior to Admission medications   Medication Sig Start Date End Date Taking? Authorizing Provider  amLODipine (NORVASC) 10 MG tablet TAKE ONE TABLET BY MOUTH ONCE DAILY 08/20/13   Kathleene Hazel, MD  aspirin 81 MG tablet Take 162 mg by mouth daily.     Historical Provider, MD  Coenzyme Q10 (CO Q 10 PO) Take by mouth daily.    Historical Provider, MD  fish oil-omega-3 fatty acids 1000 MG capsule Take 1 g by mouth as directed.     Historical Provider, MD  glipiZIDE (GLUCOTROL) 10 MG tablet Take 10 mg by mouth 2 (two) times daily before a meal.      Historical Provider, MD  LEVEMIR FLEXTOUCH 100 UNIT/ML Pen 15 Units daily. 07/17/13   Historical Provider, MD  lisinopril (PRINIVIL,ZESTRIL) 40 MG tablet Take 40 mg by mouth daily.      Historical Provider, MD  lovastatin (MEVACOR) 20 MG tablet TAKE 1/2  TABLET (10 MG TOTAL) BY MOUTH AT BEDTIME. 09/30/11   Laurey Morale, MD  metoprolol (LOPRESSOR) 50 MG tablet Take 0.5 tablets (25 mg total) by mouth 2 (two) times daily. 02/02/12   Laurey Morale, MD  nitroGLYCERIN (NITROSTAT) 0.4 MG SL tablet Place 0.4 mg under the tongue every 5 (five) minutes as needed. For chest pain    Historical Provider, MD  ranitidine (ZANTAC) 150 MG tablet Take 150 mg by mouth 2 (two) times daily.      Historical Provider, MD  vitamin B-12 (CYANOCOBALAMIN) 1000 MCG tablet Take 1,000 mcg by mouth daily.    Historical Provider, MD    No Known Allergies, tolerate statins poorly  History   Social History  . Marital Status: Single    Spouse Name: N/A    Number of Children: 2  . Years of Education: N/A   Occupational History  . retired    Social History Main Topics  . Smoking status: Never Smoker   . Smokeless tobacco: Never Used  . Alcohol Use: No  . Drug Use: No  . Sexual Activity: Not on file   Other Topics Concern  . Not on file   Social History Narrative   Family Status  Relation Status Death Age  . Mother Deceased     No known history of  premature CAD  . Father Deceased     No known history of premature CAD    PHYSICAL EXAM: Filed Vitals:   08/02/14 1428  BP: 126/60  Pulse: 50  Temp:   Resp: 14   General:  Well appearing. No respiratory difficulty HEENT: normal Neck: supple. no JVD. Carotids 2+ bilat; no bruits. No lymphadenopathy or thryomegaly appreciated. Cor: PMI nondisplaced. Regular rate & rhythm. No rubs, gallops or murmurs. Lungs: clear Abdomen: soft, nontender, nondistended. No hepatosplenomegaly. No bruits or masses. Good bowel sounds. Extremities: no cyanosis, clubbing, rash, edema, distal pulses are 2+ in all 4 extremities Neuro: alert & oriented x 3, cranial nerves grossly intact. moves all 4 extremities w/o difficulty. Affect pleasant.  ECG: Sinus bradycardia, rate 50, no acute ischemic changes  Results for orders  placed or performed during the hospital encounter of 08/02/14 (from the past 24 hour(s))  CBC with Differential     Status: None   Collection Time: 08/02/14 12:45 PM  Result Value Ref Range   WBC 8.1 4.0 - 10.5 K/uL   RBC 4.56 4.22 - 5.81 MIL/uL   Hemoglobin 14.3 13.0 - 17.0 g/dL   HCT 16.1 09.6 - 04.5 %   MCV 88.4 78.0 - 100.0 fL   MCH 31.4 26.0 - 34.0 pg   MCHC 35.5 30.0 - 36.0 g/dL   RDW 40.9 81.1 - 91.4 %   Platelets 249 150 - 400 K/uL   Neutrophils Relative % 63 43 - 77 %   Neutro Abs 5.1 1.7 - 7.7 K/uL   Lymphocytes Relative 26 12 - 46 %   Lymphs Abs 2.1 0.7 - 4.0 K/uL   Monocytes Relative 6 3 - 12 %   Monocytes Absolute 0.5 0.1 - 1.0 K/uL   Eosinophils Relative 4 0 - 5 %   Eosinophils Absolute 0.3 0.0 - 0.7 K/uL   Basophils Relative 1 0 - 1 %   Basophils Absolute 0.1 0.0 - 0.1 K/uL  Comprehensive metabolic panel     Status: Abnormal   Collection Time: 08/02/14 12:45 PM  Result Value Ref Range   Sodium 137 135 - 145 mmol/L   Potassium 4.6 3.5 - 5.1 mmol/L   Chloride 104 96 - 112 mEq/L   CO2 24 19 - 32 mmol/L   Glucose, Bld 206 (H) 70 - 99 mg/dL   BUN 25 (H) 6 - 23 mg/dL   Creatinine, Ser 7.82 0.50 - 1.35 mg/dL   Calcium 9.5 8.4 - 95.6 mg/dL   Total Protein 6.9 6.0 - 8.3 g/dL   Albumin 4.6 3.5 - 5.2 g/dL   AST 24 0 - 37 U/L   ALT 23 0 - 53 U/L   Alkaline Phosphatase 68 39 - 117 U/L   Total Bilirubin 0.5 0.3 - 1.2 mg/dL   GFR calc non Af Amer 55 (L) >90 mL/min   GFR calc Af Amer 64 (L) >90 mL/min   Anion gap 9 5 - 15  I-Stat Troponin, ED (not at [facility])     Status: Abnormal   Collection Time: 08/02/14  1:05 PM  Result Value Ref Range   Troponin i, poc 0.11 (HH) 0.00 - 0.08 ng/mL   Comment NOTIFIED PHYSICIAN    Comment 3           Myoview: 07/2011 Impression Exercise Capacity: Fair exercise capacity. BP Response: Hypertensive blood pressure response. Clinical Symptoms: No chest pain. ECG Impression: No significant ST segment change suggestive of  ischemia. Comparison with Prior Nuclear Study: No  images to compare Overall Impression: Abnormal stress nuclear study with a small fixed defect in the distal septum consistent with small prior infarct; no ischemia. EF 58%   ASSESSMENT:  1. Unstable anginal pain, acute coronary syndrome 2. Hyperlipidemia 3. Diabetes 4. Hypertension  PLAN/DISCUSSION: Mr. Borg is a 74 year old male with a history of coronary artery disease. His general health is good but he has had sudden onset of chest pain that was there when he woke up 4 days ago. It finally resolved, but since then he has had consistent exertional chest pain. He has not had a heart catheterization in 11 years. His last stress test was without ischemia and his EF was preserved.  Consider definitive evaluation with cardiac catheterization. The risks and benefits of a cardiac catheterization including, but not limited to, death, stroke, MI, kidney damage and bleeding were discussed with the patient who indicates understanding and agrees to proceed.   We will screen him for CRFs and their control. Continue home medications.  Theodore Demark, PA-C 08/02/2014 3:04 PM Beeper [phone]  Patient seen and examined with Theodore Demark, PA-C. We discussed all aspects of the encounter. I agree with the assessment and plan as stated above.   CP concerning for [place]. Agree with plan for cath today. Start ASA and heparin. Continue bblocker and statin. Plan cath today. Risks/indications discussed. Willing to proceed.  Daniel Bensimhon,MD 3:06 PM

## 2014-08-02 NOTE — CV Procedure (Addendum)
PROCEDURE:  Left heart catheterization with selective coronary angiography, left ventriculogram.  PCI LAD, PCI mid and distal Left circumflex  INDICATIONS:  NSTEMI  The risks, benefits, and details of the procedure were explained to the patient.  The patient verbalized understanding and wanted to proceed.  Informed written consent was obtained.  PROCEDURE TECHNIQUE:  After Xylocaine anesthesia a 93F slender sheath was placed in the right radial artery with a single anterior needle wall stick.   IV Heparin was given.  Right coronary angiography was done using a Judkins R4 guide catheter.  Left coronary angiography was done using a Judkins L3.5 guide catheter.  Left ventriculography was done using a pigtail catheter.  A TR band was used for hemostasis.   CONTRAST:  Total of 225 cc.  COMPLICATIONS:  None.    HEMODYNAMICS:  Aortic pressure was 130/54; LV pressure was 132/4; LVEDP 12.  There was no gradient between the left ventricle and aorta.    ANGIOGRAPHIC DATA:   The left main coronary artery is widely patent.  The left anterior descending artery is a medium to large size vessel. There is mild, calcific proximal disease. The mid vessel stent has diffuse restenosis in the distal portion. The distal LAD is small. There is a large diagonal vessel which has moderate disease in the proximal to midportion which was jailed by the prior stent.  The left circumflex artery is a large vessel. There is a small first OM with 99% stenosis. The mid circumflex has 75% stenosis. After the third obtuse marginal, there is an 80% distal circumflex stenosis. The second obtuse marginal is small but moderately diseased.  The right coronary artery is a large dominant vessel.  There is mild disease proximally. The mid vessel stent is widely patent. The posterior lateral artery is medium-sized and patent. The posterior descending artery is very large and has a mild to moderate lesion in the mid vessel. The PDA  supplies the majority of blood to the apex as it is larger than the distal LAD.[name].  LEFT VENTRICULOGRAM:  Left ventricular angiogram was done in the 30 RAO projection and revealed normal left ventricular wall motion and systolic function with an estimated ejection fraction of 55 %.  LVEDP was 12 mmHg.  PCI NARRATIVE: A CLS 3.0 guiding catheters using his left main. A pro-water wire was placed across the diseased in the LAD. A 2.5 x 15 balloon was used to predilate. There is some mild disease past the stent. A 2.25 x 24 Synergy drug-eluting stent was deployed to cover the area of disease in the LAD. There was some difficulty in getting the stent to the mid LAD. A cougar wire was used as a buddy wire to help support get the stent down to the mid LAD. The stent was postdilated with a 2.75 x 15 noncompliant balloon, to the proximal portion of the new stent. There was an excellent angiographic result.  The pro-water wire was then directed into the circumflex. The distal and mid lesions were predilated with a 2.5 x 15 balloon. A 3.5 x 16 Synergy drug-eluting stent was deployed in the distal circumflex. A 4.0 x 16 Synergy drug-eluting stent was deployed in the mid circumflex. Both stents were postdilated with a 4.0 x 12 noncompliant balloon. There was an excellent angiographic result with no residual stenosis. The patient tolerated the procedure well. Intracoronary nitroglycerin was administered.  IMPRESSIONS:  1. Normal left main coronary artery. 2. Severe restenosis in the mid  left anterior descending artery stent which was successfully treated with a new drug-eluting stent as noted above. Moderate disease in the distal LAD and diagonal branches. 3. Sequential severe lesions in the mid and distal left circumflex artery.  Both areas were treated with drug-eluting stents. There was disease in small obtuse marginal branches. 4. Patent stent in the right coronary artery. 5. Normal left ventricular systolic  function.  LVEDP 12 mmHg.  Ejection fraction 55%. 6.   The culprit for the Non-STEMI is difficult to determine due to several lesions.  I suspect it was the circumflex lesions since his ECG was nondiagnostic.   RECOMMENDATION:  Continue dual antiplatelet therapy for at least a year. Continue aggressive secondary prevention. Will continue IV Angiomax at the reduced rate until the bag runs out. He'll need aggressive secondary prevention. Watch her overnight. Possible discharge tomorrow.

## 2014-08-02 NOTE — Telephone Encounter (Signed)
Pt c/o of Chest Pain: 1. Are you having CP right now? No 2. Are you experiencing any other symptoms (ex. SOB, nausea, vomiting, sweating)? No 3. How long have you been experiencing CP? For about 4 days 4. Is your CP continuous or coming and going? Coming and going 5. Have you taken Nitroglycerin? No   Comments: Pt called states that he has had chest pains about 4 days ago. He states that it occurred throughout the entire day until night fell. Made appt with Scott weaver for  08/19/2014 at 3:20pm. However the pt would like to speak with a nurse.

## 2014-08-02 NOTE — ED Provider Notes (Signed)
CSN: [id number]     Arrival date & time 08/02/14  1235 History   First MD Initiated Contact with Patient 08/02/14 1323     Chief Complaint  Patient presents with  . Chest Pain  . Shortness of Breath      HPI Pt presents to department for evaluation of L sided chest pain and SOB. Onset Tuesday morning Past Medical History  Diagnosis Date  . 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 DDD (degenerative disc disease)   . DM (diabetes mellitus)   . HTN (hypertension)   . HLD (hyperlipidemia)   . Allergic rhinitis   . MI (myocardial infarction) 2004    stents to the LAD and staged stent RCA, EF 55%   Past Surgical History  Procedure Laterality Date  . Coronary stent placement  2004    x2 LAD and RCA   . Prostate surgery    . Spinal fusion      L4-S1   History reviewed. No pertinent family history. History  Substance Use Topics  . Smoking status: Never Smoker   . Smokeless tobacco: Never Used  . Alcohol Use: No    Review of Systems  All other systems reviewed and are negative.     Allergies  Review of patient's allergies indicates no known allergies.  Home Medications   Prior to Admission medications   Medication Sig Start Date End Date Taking? Authorizing Provider  acetaminophen (TYLENOL) 650 MG CR tablet Take 1,300 mg by mouth every 8 (eight) hours as needed for pain.   Yes Historical Provider, MD  amLODipine (NORVASC) 10 MG tablet TAKE ONE TABLET BY MOUTH ONCE DAILY 08/20/13  Yes Kathleene Hazel, MD  aspirin 81 MG tablet Take 81 mg by mouth 2 (two) times daily.    Yes Historical Provider, MD  Coenzyme Q10 (CO Q 10 PO) Take 1 tablet by mouth daily.    Yes Historical Provider, MD  fish oil-omega-3 fatty acids 1000 MG capsule Take 1 g by mouth 2 (two) times daily.    Yes Historical Provider, MD  glipiZIDE (GLUCOTROL) 10 MG tablet Take 10 mg by mouth 2 (two) times daily before a meal.     Yes Historical Provider, MD  Glycerin-Polysorbate 80 (REFRESH DRY EYE THERAPY OP)  Apply 1 drop to eye 2 (two) times daily.   Yes Historical Provider, MD  ibuprofen (ADVIL,MOTRIN) 200 MG tablet Take 200 mg by mouth every 6 (six) hours as needed.   Yes Historical Provider, MD  LEVEMIR FLEXTOUCH 100 UNIT/ML Pen Inject 15 Units into the skin 2 (two) times daily.  07/17/13  Yes Historical Provider, MD  lisinopril (PRINIVIL,ZESTRIL) 40 MG tablet Take 40 mg by mouth daily.     Yes Historical Provider, MD  metoprolol (LOPRESSOR) 50 MG tablet Take 0.5 tablets (25 mg total) by mouth 2 (two) times daily. Patient taking differently: Take 50 mg by mouth 2 (two) times daily.  02/02/12  Yes Laurey Morale, MD  ranitidine (ZANTAC) 150 MG tablet Take 150 mg by mouth 2 (two) times daily.     Yes Historical Provider, MD  nitroGLYCERIN (NITROSTAT) 0.4 MG SL tablet Place 0.4 mg under the tongue every 5 (five) minutes as needed. For chest pain    Historical Provider, MD   BP 172/72 mmHg  Pulse 52  Temp(Src) 97.5 F (36.4 C) (Oral)  Resp 15  Ht  (1.88 m)  Wt 207 lb (93.895 kg)  BMI 26.57 kg/m2  SpO2 98% Physical Exam  Constitutional: He is oriented to person, place, and time. He appears well-developed and well-nourished. No distress.  HENT:  Head: Normocephalic and atraumatic.  Eyes: Pupils are equal, round, and reactive to light.  Neck: Normal range of motion.  Cardiovascular: Normal rate and intact distal pulses.   Pulmonary/Chest: No respiratory distress. He has no wheezes. He has no rales.  Abdominal: Normal appearance. He exhibits no distension.  Musculoskeletal: Normal range of motion.  Neurological: He is alert and oriented to person, place, and time. No cranial nerve deficit.  Skin: Skin is warm and dry. No rash noted.  Psychiatric: He has a normal mood and affect. His behavior is normal.  Nursing note and vitals reviewed.   ED Course  Procedures (including critical care time)  CRITICAL CARE Performed by: Nelva Nay L Total critical care time: 30 min Critical care  time was exclusive of separately billable procedures and treating other patients. Critical care was necessary to treat or prevent imminent or life-threatening deterioration. Critical care was time spent personally by me on the following activities: development of treatment plan with patient and/or surrogate as well as nursing, discussions with consultants, evaluation of patient's response to treatment, examination of patient, obtaining history from patient or surrogate, ordering and performing treatments and interventions, ordering and review of laboratory studies, ordering and review of radiographic studies, pulse oximetry and re-evaluation of patient's condition.   Medications  heparin ADULT infusion 100 units/mL (25000 units/250 mL) (1,000 Units/hr Intravenous New Bag/Given 08/02/14 1445)  amLODipine (NORVASC) tablet 10 mg (not administered)  aspirin tablet 162 mg (not administered)  fish oil-omega-3 fatty acids capsule 1 g (not administered)  glipiZIDE (GLUCOTROL) tablet 10 mg (not administered)  Insulin Detemir (LEVEMIR) FlexPen 15 Units (not administered)  lisinopril (PRINIVIL,ZESTRIL) tablet 40 mg (not administered)  pravastatin (PRAVACHOL) tablet 20 mg (not administered)  metoprolol tartrate (LOPRESSOR) tablet 25 mg (not administered)  famotidine (PEPCID) tablet 20 mg (not administered)  vitamin B-12 (CYANOCOBALAMIN) tablet 1,000 mcg (not administered)  heparin bolus via infusion 4,000 Units (0 Units Intravenous Stopped 08/02/14 1504)  aspirin chewable tablet 243 mg (243 mg Oral Given 08/02/14 1432)    Labs Review Labs Reviewed  COMPREHENSIVE METABOLIC PANEL - Abnormal; Notable for the following:    Glucose, Bld 206 (*)    BUN 25 (*)    GFR calc non Af Amer 55 (*)    GFR calc Af Amer 64 (*)    All other components within normal limits  I-STAT TROPOININ, ED - Abnormal; Notable for the following:    Troponin i, poc 0.11 (*)    All other components within normal limits  CBC WITH  DIFFERENTIAL    Imaging Review Dg Chest 2 View  08/02/2014   CLINICAL DATA:  LEFT side chest pain for 3 days, "weakness" in chest for 3 days, history coronary artery disease post stenting, diabetes, hypertension, MI  EXAM: CHEST  2 VIEW  COMPARISON:  07/28/2011  FINDINGS: Upper normal heart size.  Normal mediastinal contours and pulmonary vascularity.  Lungs clear.  No pleural effusion or pneumothorax.  Minimal central peribronchial thickening.  No acute osseous findings.  Mild scattered endplate spur formation thoracic spine.  IMPRESSION: Minimal bronchitic changes without infiltrate.   Electronically Signed   By: Ulyses Southward M.D.   On: 08/02/2014 15:25     EKG Interpretation   Date/Time:  Friday August 02 2014 12:38:27 EST Ventricular Rate:  58 PR Interval:  212 QRS Duration: 86 QT Interval:  414 QTC Calculation: 406 R  Axis:   14 Text Interpretation:  Sinus bradycardia with 1st degree A-V block  Otherwise normal ECG No significant change since last tracing Confirmed by  Delena Casebeer  MD, Ade Stmarie ([id number]) on 08/02/2014 1:25:24 PM      MDM   Final diagnoses:  Ischemic chest pain        Nelia Shiobert L Joanna Hall, MD 08/02/14 [phone]

## 2014-08-03 ENCOUNTER — Encounter (HOSPITAL_COMMUNITY): Payer: Self-pay | Admitting: Physician Assistant

## 2014-08-03 ENCOUNTER — Other Ambulatory Visit: Payer: Self-pay | Admitting: Physician Assistant

## 2014-08-03 DIAGNOSIS — E785 Hyperlipidemia, unspecified: Secondary | ICD-10-CM

## 2014-08-03 LAB — COMPREHENSIVE METABOLIC PANEL
ALT: 20 U/L (ref 0–53)
ANION GAP: 5 (ref 5–15)
AST: 20 U/L (ref 0–37)
Albumin: 3.8 g/dL (ref 3.5–5.2)
Alkaline Phosphatase: 50 U/L (ref 39–117)
BILIRUBIN TOTAL: 0.8 mg/dL (ref 0.3–1.2)
BUN: 22 mg/dL (ref 6–23)
CALCIUM: 9.1 mg/dL (ref 8.4–10.5)
CHLORIDE: 109 meq/L (ref 96–112)
CO2: 23 mmol/L (ref 19–32)
Creatinine, Ser: 1.12 mg/dL (ref 0.50–1.35)
GFR calc Af Amer: 72 mL/min — ABNORMAL LOW (ref >90)
GFR calc non Af Amer: 62 mL/min — ABNORMAL LOW (ref >90)
Glucose, Bld: 174 mg/dL — ABNORMAL HIGH (ref 70–99)
POTASSIUM: 3.9 mmol/L (ref 3.5–5.1)
SODIUM: 137 mmol/L (ref 135–145)
TOTAL PROTEIN: 5.8 g/dL — AB (ref 6.0–8.3)

## 2014-08-03 LAB — LIPID PANEL
Cholesterol: 152 mg/dL (ref 0–200)
HDL: 25 mg/dL — AB (ref >39)
LDL CALC: 79 mg/dL (ref 0–99)
Total CHOL/HDL Ratio: 6.1 RATIO
Triglycerides: 240 mg/dL — ABNORMAL HIGH (ref <150)
VLDL: 48 mg/dL — ABNORMAL HIGH (ref 0–40)

## 2014-08-03 LAB — CBC
HEMATOCRIT: 35.6 % — AB (ref 39.0–52.0)
Hemoglobin: 12.4 g/dL — ABNORMAL LOW (ref 13.0–17.0)
MCH: 30.5 pg (ref 26.0–34.0)
MCHC: 34.8 g/dL (ref 30.0–36.0)
MCV: 87.7 fL (ref 78.0–100.0)
PLATELETS: 211 10*3/uL (ref 150–400)
RBC: 4.06 MIL/uL — AB (ref 4.22–5.81)
RDW: 12.3 % (ref 11.5–15.5)
WBC: 8.4 10*3/uL (ref 4.0–10.5)

## 2014-08-03 LAB — HEMOGLOBIN A1C
Hgb A1c MFr Bld: 7.1 % — ABNORMAL HIGH (ref <5.7)
Mean Plasma Glucose: 157 mg/dL — ABNORMAL HIGH (ref <117)

## 2014-08-03 LAB — TROPONIN I
TROPONIN I: 0.32 ng/mL — AB (ref <0.031)
Troponin I: 0.29 ng/mL — ABNORMAL HIGH (ref <0.031)

## 2014-08-03 LAB — GLUCOSE, CAPILLARY: GLUCOSE-CAPILLARY: 132 mg/dL — AB (ref 70–99)

## 2014-08-03 MED ORDER — TICAGRELOR 90 MG PO TABS: 90.0000 mg | ORAL_TABLET | Freq: Two times a day (BID) | ORAL | Status: DC

## 2014-08-03 MED ORDER — METOPROLOL TARTRATE 25 MG PO TABS: 12.5000 mg | ORAL_TABLET | Freq: Two times a day (BID) | ORAL | Status: DC

## 2014-08-03 MED ORDER — PRAVASTATIN SODIUM 20 MG PO TABS: 20.0000 mg | ORAL_TABLET | Freq: Every day | ORAL | Status: DC

## 2014-08-03 MED ORDER — METOPROLOL TARTRATE 12.5 MG HALF TABLET
12.5000 mg | ORAL_TABLET | Freq: Two times a day (BID) | ORAL | Status: DC
  Administered 2014-08-03: 11:00:00 12.5 mg via ORAL
  Filled 2014-08-03 (×2): qty 1

## 2014-08-03 NOTE — Progress Notes (Signed)
TR BAND REMOVAL  LOCATION:    right radial  DEFLATED PER PROTOCOL:    Yes.    TIME BAND OFF / DRESSING APPLIED:    04:30 am   SITE UPON ARRIVAL:    Level 0  SITE AFTER BAND REMOVAL:    Level 0  REVERSE ALLEN'S TEST:     positive  CIRCULATION SENSATION AND MOVEMENT:    Within Normal Limits   Yes.    COMMENTS:

## 2014-08-03 NOTE — Discharge Summary (Signed)
Discharge Summary   Patient ID: Joseph [name], MRN: [id number], DOB/AGE: 09/27/1940 74 y.o.  Admit date: 08/02/2014 Discharge date: 08/03/2014   Primary Care Physician:  Dema SeverinYORK,REGINA F   Primary Cardiologist:  Dr. Marca Anconaalton McLean   Reason for Admission:  Unstable Angina   Primary Discharge Diagnoses:  Principal Problem:   NSTEMI (non-ST elevated myocardial infarction) Active Problems:   CAD (coronary artery disease), native coronary artery   Pure hypercholesterolemia   Essential hypertension     Wt Readings from Last 3 Encounters:  08/03/14 178 lb 5.6 oz (80.9 kg)  08/30/13 212 lb (96.163 kg)  08/01/12 217 lb (98.431 kg)    Secondary Discharge Diagnoses:   Past Medical History  Diagnosis Date  . 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  s/p back surgery  . DM (diabetes mellitus)   . HTN (hypertension)     had side effects with chlorthalidone  . HLD (hyperlipidemia)   . Allergic rhinitis   . CAD (coronary artery disease)     a. s/p MI in 2004:  stents to the LAD and staged stent RCA, EF 55%;  b.  NSTEMI (1/16):  LHC - mid LAD stent with diff dist restenosis, prox to mid Dx mod dsz jailed by stent, small OM1 99, mCFX 75, dCFX 80, mRCA stent ok, mPDA mild to mod dsz, EF 55% >> PCI:  Synergy DES to LAD; Synergy DES (x2) mid and dist CFX  . Hx of echocardiogram     Echo (3/11): EF 60%, normal wall motion, mild MR, mild left atrial enlargement, mildly dilated ascending aorta.  [name]. Hx of cardiovascular stress test     Myoview (1/13): Small fixed apical septal defect with no ischemia, EF 58%.  [name]. History of Doppler ultrasound     Arterial dopplers (3/11): no evidence for significant PAD. ABIs 10/12: Normal.    . History of CVA (cerebrovascular accident)     Small CVA seen by head CT (incidental) in 1/13.  Carotid dopplers in 1/13 showed minimal disease  . Abdominal discomfort    Abdominal discomfort: EGD 2011 showed no significant abnormalities.  Possibly due to metformin.      Allergies:   No Known Allergies    Procedures Performed This Admission:    1. Cardiac Catheterization and Percutaneous Coronary Intervention 08/02/14: ANGIOGRAPHIC DATA: The left main coronary artery is widely patent.  The left anterior descending artery is a medium to large size vessel. There is mild, calcific proximal disease. The mid vessel stent has diffuse restenosis in the distal portion. The distal LAD is small. There is a large diagonal vessel which has moderate disease in the proximal to midportion which was jailed by the prior stent.  The left circumflex artery is a large vessel. There is a small first OM with 99% stenosis. The mid circumflex has 75% stenosis. After the third obtuse marginal, there is an 80% distal circumflex stenosis. The second obtuse marginal is small but moderately diseased.  The right coronary artery is a large dominant vessel. There is mild disease proximally. The mid vessel stent is widely patent. The posterior lateral artery is medium-sized and patent. The posterior descending artery is very large and has a mild to moderate lesion in the mid vessel. The PDA supplies the majority of blood to the apex as it is larger than the distal LAD.[name].  LEFT VENTRICULOGRAM: Left ventricular angiogram was  done in the 30 RAO projection and revealed normal left ventricular wall motion and systolic function with an estimated ejection fraction of 55 %. LVEDP was 12 mmHg.  PCI NARRATIVE: A CLS 3.0 guiding catheters using his left main. A pro-water wire was placed across the diseased in the LAD. A 2.5 x 15 balloon was used to predilate. There is some mild disease past the stent. A 2.25 x 24 Synergy drug-eluting stent was deployed to cover the area of disease in the LAD. There was some difficulty in getting the stent to the mid LAD. A cougar wire was used as a buddy wire to help  support get the stent down to the mid LAD. The stent was postdilated with a 2.75 x 15 noncompliant balloon, to the proximal portion of the new stent. There was an excellent angiographic result.  The pro-water wire was then directed into the circumflex. The distal and mid lesions were predilated with a 2.5 x 15 balloon. A 3.5 x 16 Synergy drug-eluting stent was deployed in the distal circumflex. A 4.0 x 16 Synergy drug-eluting stent was deployed in the mid circumflex. Both stents were postdilated with a 4.0 x 12 noncompliant balloon. There was an excellent angiographic result with no residual stenosis. The patient tolerated the procedure well. Intracoronary nitroglycerin was administered.  IMPRESSIONS:  1. Normal left main coronary artery. 2. Severe restenosis in the mid left anterior descending artery stent which was successfully treated with a new drug-eluting stent as noted above. Moderate disease in the distal LAD and diagonal branches. 3. Sequential severe lesions in the mid and distal left circumflex artery. Both areas were treated with drug-eluting stents. There was disease in small obtuse marginal branches. 4. Patent stent in the right coronary artery. 5. Normal left ventricular systolic function. LVEDP 12 mmHg. Ejection fraction 55%.   Hospital Course:  CROSS Joseph [name] is a 74 y.o. male with a hx of CAD, s/p ant MI 2004 >> pLAD stent and staged RCA stent (Taxus), HTN, DM2, HL.  He woke up with chest pain on 07/30/14.  Over the next several days, he noted exertional chest pain and dyspnea with minimal activities.  He ultimately called the office and was directed to the ED.  He had minimal elevation in his troponins ruling him in for NSTEMI.  He was admitted and placed on IV Heparin.  He underwent LHC yesterday that demonstrated severe restenosis in the mid LAD which was treated with a DES and sequential severe lesions in the mid and distal CFX - both areas were treated with DES.  RCA stent  was patent and there was some disease in a small OM branch.  EF remained preserved at 55%.  He tolerated the procedure well and had no immediate complications.  He was interviewed and examined this AM by Dr. Dina Rich.  He is doing well without further chest pain.  He is felt stable for DC to home.  Beta blocker dose will be reduced at DC due to bradycardia overnight.  The importance of dual antiplatelet therapy with Brilinta and ASA was discussed with the patient.  He will be seen by case management and given a prescription assistance card at DC.  He will be set up for a Transitional Care Management FU appointment with Dr. Marca Ancona or a PA/NP in 2 weeks.     Discharge Vitals:   Blood pressure 147/70, pulse 68, temperature 97.7 F (36.5 C), temperature source Oral, resp. rate 20, height  (1.88 m),  weight 178 lb 5.6 oz (80.9 kg), SpO2 97 %.   Labs:   Recent Labs  08/02/14 1245 08/03/14 0211  WBC 8.1 8.4  HGB 14.3 12.4*  HCT 40.3 35.6*  MCV 88.4 87.7  PLT 249 211     Recent Labs  08/02/14 1245 08/03/14 0211  NA 137 137  K 4.6 3.9  CL 104 109  CO2 24 23  BUN 25* 22  CREATININE 1.24 1.12  CALCIUM 9.5 9.1  PROT 6.9 5.8*  BILITOT 0.5 0.8  ALKPHOS 68 50  ALT 23 20  AST 24 20     Recent Labs  08/02/14 2110 08/03/14 0211 08/03/14 0844  TROPONINI 0.29* 0.29* 0.32*    Lab Results  Component Value Date   CHOL 152 08/03/2014   HDL 25* 08/03/2014   LDLCALC 79 08/03/2014   TRIG 240* 08/03/2014    No results found for: DDIMER  Lab Results  Component Value Date   TSH 1.633 08/02/2014    Diagnostic Procedures and Studies:  Dg Chest 2 View  08/02/2014   CLINICAL DATA:  LEFT side chest pain for 3 days, "weakness" in chest for 3 days, history coronary artery disease post stenting, diabetes, hypertension, MI  EXAM: CHEST  2 VIEW  COMPARISON:  07/28/2011  FINDINGS: Upper normal heart size.  Normal mediastinal contours and pulmonary vascularity.  Lungs clear.   No pleural effusion or pneumothorax.  Minimal central peribronchial thickening.  No acute osseous findings.  Mild scattered endplate spur formation thoracic spine.  IMPRESSION: Minimal bronchitic changes without infiltrate.   Electronically Signed   By: Ulyses Southward M.D.   On: 08/02/2014 15:25    Disposition:   Pt is being discharged home today in good condition.  Follow-up Plans & Appointments      Follow-up Information    Follow up with Marca Ancona, MD In 2 weeks.   Specialty:  Cardiology   Why:  the office will call to arrange an appointment with Dr. Shirlee Latch or a PA or NP   Contact information:   [address] [place] [address] [phone]       Discharge Medications    Medication List    STOP taking these medications        ibuprofen 200 MG tablet  Commonly known as:  ADVIL,MOTRIN     lovastatin 20 MG tablet  Commonly known as:  MEVACOR  Replaced by:  pravastatin 20 MG tablet      TAKE these medications        acetaminophen 650 MG CR tablet  Commonly known as:  TYLENOL  Take 1,300 mg by mouth every 8 (eight) hours as needed for pain.     amLODipine 10 MG tablet  Commonly known as:  NORVASC  TAKE ONE TABLET BY MOUTH ONCE DAILY     aspirin 81 MG tablet  Take 81 mg by mouth 2 (two) times daily.     CO Q 10 PO  Take 1 tablet by mouth daily.     fish oil-omega-3 fatty acids 1000 MG capsule  Take 1 g by mouth 2 (two) times daily.     glipiZIDE 10 MG tablet  Commonly known as:  GLUCOTROL  Take 10 mg by mouth 2 (two) times daily before a meal.     LEVEMIR FLEXTOUCH 100 UNIT/ML Pen  Generic drug:  Insulin Detemir  Inject 15 Units into the skin 2 (two) times daily.     lisinopril 40 MG tablet  Commonly known as:  PRINIVIL,ZESTRIL  Take 40 mg by mouth daily.     metoprolol tartrate 25 MG tablet  Commonly known as:  LOPRESSOR  Take 0.5 tablets (12.5 mg total) by mouth 2 (two) times daily.     nitroGLYCERIN 0.4 MG SL tablet    Commonly known as:  NITROSTAT  Place 0.4 mg under the tongue every 5 (five) minutes as needed. For chest pain     pravastatin 20 MG tablet  Commonly known as:  PRAVACHOL  Take 1 tablet (20 mg total) by mouth daily at 6 PM.     ranitidine 150 MG tablet  Commonly known as:  ZANTAC  Take 150 mg by mouth 2 (two) times daily.     REFRESH DRY EYE THERAPY OP  Apply 1 drop to eye 2 (two) times daily.     ticagrelor 90 MG Tabs tablet  Commonly known as:  BRILINTA  Take 1 tablet (90 mg total) by mouth 2 (two) times daily.         Outstanding Labs/Studies  Check Lipids and LFTs in 6 weeks.    Duration of Discharge Encounter: Greater than 30 minutes including physician and PA time.  Signed, Tereso Newcomer, PA-C   08/03/2014 9:44 AM

## 2014-08-03 NOTE — Progress Notes (Signed)
CARDIAC REHAB PHASE I   PRE:  Rate/Rhythm: 62 SR    BP: sitting 147/70    SaO2: 96 RA  MODE:  Ambulation: 650 ft   POST:  Rate/Rhythm: 94 SR    BP: sitting 166//68     SaO2:   Tolerated well except felt a little weak. Was walking quickly with incline and high altitude. No CP. Ed completed. Voiced understanding and requests [facility]. [id number]   Elissa LovettReeve, Joseph Kanitz Golden TriangleKristan CES, ACSM 08/03/2014 9:03 AM

## 2014-08-03 NOTE — Progress Notes (Signed)
Primary cardiologist:  Dr. Marca Anconaalton McLean   Subjective:    74 y.o. male with hx of CAD: ant MI 2004 >> pLAD stent and staged RCA stent (Taxus) admitted 08/02/2014 after awakening with L sided chest pain (4 days prior).  Since that time, he consistently noted chest pain and dyspnea with minimal exertion and ultimately presented to the ED.   Troponins were minimally elevated ruling him in for NSTEMI (0.11 >> 0.29 >> 0.29).  LHC was done yesterday and demonstrated patent stent in the RCA but significant ISR in the LAD and severe disease in the CFX.  These lesions were treated with DES (one to LAD and 2 to CFX).  EF is preserved.    This AM he denies chest pain or dyspnea.  Objective:   Temp:  [97.5 F (36.4 C)-97.9 F (36.6 C)] 97.7 F (36.5 C) (01/16 0734) Pulse Rate:  [47-64] 58 (01/16 0734) Resp:  [10-20] 20 (01/16 0734) BP: (117-172)/(50-91) 134/62 mmHg (01/16 0734) SpO2:  [95 %-99 %] 97 % (01/16 0734) Weight:  [178 lb 5.6 oz (80.9 kg)-207 lb (93.895 kg)] 178 lb 5.6 oz (80.9 kg) (01/16 0530)    Filed Weights   08/02/14 1248 08/02/14 1722 08/03/14 0530  Weight: 207 lb (93.895 kg) 207 lb (93.895 kg) 178 lb 5.6 oz (80.9 kg)    Intake/Output Summary (Last 24 hours) at 08/03/14 0751 Last data filed at 08/03/14 0723  Gross per 24 hour  Intake    240 ml  Output    700 ml  Net   -460 ml    Telemetry:  NSR  Exam:  General:  NAD  HEENT:  normal  Lungs:  CTA bilaterally   Cardiac:  RRR, no edema, no JVD, right wrist without hematoma or mass   Abdomen:  soft  MSK:  No deformity  Lab Results:  Basic Metabolic Panel:  Recent Labs Lab 08/02/14 1245 08/03/14 0211  NA 137 137  K 4.6 3.9  CL 104 109  CO2 24 23  GLUCOSE 206* 174*  BUN 25* 22  CREATININE 1.24 1.12  CALCIUM 9.5 9.1    Liver Function Tests:  Recent Labs Lab 08/02/14 1245 08/03/14 0211  AST 24 20  ALT 23 20  ALKPHOS 68 50  BILITOT 0.5 0.8  PROT 6.9 5.8*  ALBUMIN 4.6 3.8     CBC:  Recent Labs Lab 08/02/14 1245 08/03/14 0211  WBC 8.1 8.4  HGB 14.3 12.4*  HCT 40.3 35.6*  MCV 88.4 87.7  PLT 249 211    Cardiac Enzymes:  Recent Labs Lab 08/02/14 2110 08/03/14 0211  TROPONINI 0.29* 0.29*    BNP: No results for input(s): PROBNP in the last 8760 hours.  Coagulation: No results for input(s): INR in the last 168 hours.  ECG:  NSR   Medications:   Scheduled Medications: . amLODipine  10 mg Oral Daily  . aspirin  81 mg Oral Daily  . famotidine  20 mg Oral BID  . glipiZIDE  10 mg Oral BID AC  . insulin aspart  0-15 Units Subcutaneous TID WC  . insulin detemir  15 Units Subcutaneous Daily  . lisinopril  40 mg Oral Daily  . metoprolol  25 mg Oral BID  . omega-3 acid ethyl esters  1 g Oral Daily  . pravastatin  20 mg Oral q1800  . sodium chloride  3 mL Intravenous Q12H  . ticagrelor  90 mg Oral BID  . vitamin B-12  1,000 mcg Oral Daily  Infusions:     PRN Medications:  sodium chloride, acetaminophen, ALPRAZolam, nitroGLYCERIN, ondansetron (ZOFRAN) IV, sodium chloride, zolpidem   LHC/Percutaneous Coronary Intervention 08/02/14:  ANGIOGRAPHIC DATA: The left main coronary artery is widely patent.  The left anterior descending artery is a medium to large size vessel. There is mild, calcific proximal disease. The mid vessel stent has diffuse restenosis in the distal portion. The distal LAD is small. There is a large diagonal vessel which has moderate disease in the proximal to midportion which was jailed by the prior stent.  The left circumflex artery is a large vessel. There is a small first OM with 99% stenosis. The mid circumflex has 75% stenosis. After the third obtuse marginal, there is an 80% distal circumflex stenosis. The second obtuse marginal is small but moderately diseased.  The right coronary artery is a large dominant vessel. There is mild disease proximally. The mid vessel stent is widely patent. The posterior  lateral artery is medium-sized and patent. The posterior descending artery is very large and has a mild to moderate lesion in the mid vessel. The PDA supplies the majority of blood to the apex as it is larger than the distal LAD.[name]  LEFT VENTRICULOGRAM: Left ventricular angiogram was done in the 30 RAO projection and revealed normal left ventricular wall motion and systolic function with an estimated ejection fraction of 55 %. LVEDP was 12 mmHg.  PCI NARRATIVE: A CLS 3.0 guiding catheters using his left main. A pro-water wire was placed across the diseased in the LAD. A 2.5 x 15 balloon was used to predilate. There is some mild disease past the stent. A 2.25 x 24 Synergy drug-eluting stent was deployed to cover the area of disease in the LAD. There was some difficulty in getting the stent to the mid LAD. A cougar wire was used as a buddy wire to help support get the stent down to the mid LAD. The stent was postdilated with a 2.75 x 15 noncompliant balloon, to the proximal portion of the new stent. There was an excellent angiographic result.  The pro-water wire was then directed into the circumflex. The distal and mid lesions were predilated with a 2.5 x 15 balloon. A 3.5 x 16 Synergy drug-eluting stent was deployed in the distal circumflex. A 4.0 x 16 Synergy drug-eluting stent was deployed in the mid circumflex. Both stents were postdilated with a 4.0 x 12 noncompliant balloon. There was an excellent angiographic result with no residual stenosis. The patient tolerated the procedure well. Intracoronary nitroglycerin was administered.  IMPRESSIONS:  1. Normal left main coronary artery. 2. Severe restenosis in the mid left anterior descending artery stent which was successfully treated with a new drug-eluting stent as noted above. Moderate disease in the distal LAD and diagonal branches. 3. Sequential severe lesions in the mid and distal left circumflex artery. Both areas were treated with  drug-eluting stents. There was disease in small obtuse marginal branches. 4. Patent stent in the right coronary artery. 5. Normal left ventricular systolic function. LVEDP 12 mmHg. Ejection fraction 55%.  RECOMMENDATION: Continue dual antiplatelet therapy for at least a year. Continue aggressive secondary prevention. Will continue IV Angiomax at the reduced rate until the bag runs out. He'll need aggressive secondary prevention. Watch her overnight. Possible discharge tomorrow.    Assessment:    Principal Problem:   NSTEMI (non-ST elevated myocardial infarction) Active Problems:   CAD (coronary artery disease), native coronary artery   Pure hypercholesterolemia   Essential hypertension  Plan/Discussion:    Doing well after 2v PCI with DES (total of 3) in the setting of NSTEMI.  No further chest pain.   RN to ambulate this AM. If ok, anticipate DC to home after seen by MD. We discussed the importance of Brilinta.  Will make sure Rx card is given to patient and that case management sees him. Continue ASA, Norvasc, Lisinopril, Lopressor, statin. Plan DC with Dr. Marca Ancona or PA/NP in 2 weeks (TCM visit).  Tereso Newcomer, PA-C   08/03/2014 7:51 AM Pager [phone]

## 2014-08-03 NOTE — Progress Notes (Signed)
Patient seen and discussed with PA Alben SpittleWeaver, I agree with his documentation. Admitted with NSTEMI, cath yesterday and received DES to mid LAD, DES to mid LCX, and DES to distal LCX. LVEF 55%. Post cath labs Cr 1.12, Hgb 12.4. Trop 0.29 stable. EKG no ischemic changes. On ASA, lopressor, prava 20, brilinta. Myalgias on higher dose statin, will continue low dose at this time. Sinus brady yesterday, beta blocker held. Will decrease to 12.5mg  bid. Will need f/u in 2 weeks in cards clinic, referral to cardiac rehab.   Dominga FerryJ Branch MD

## 2014-08-05 ENCOUNTER — Telehealth: Payer: Self-pay | Admitting: Physician Assistant

## 2014-08-05 LAB — POCT ACTIVATED CLOTTING TIME: Activated Clotting Time: 411 seconds

## 2014-08-05 MED FILL — Sodium Chloride IV Soln 0.9%: INTRAVENOUS | Qty: 50 | Status: AC

## 2014-08-05 NOTE — Telephone Encounter (Signed)
[facility].  Patient understands to follow up with provider Azucena KubaScott Weaver,PAC on 08/19/14  at 3:20PM [facility] Patient understands discharge instructions? yes Patient understands medications and regiment? yes Patient understands to bring all medications to this visit? yes

## 2014-08-05 NOTE — Progress Notes (Signed)
UR completed - Retro   Delainee Tramel K. Lisandro Meggett, RN, BSN, MSHL, CCM  08/05/2014 12:40 PM

## 2014-08-05 NOTE — Telephone Encounter (Signed)
New problem   TCM w/scott 08/19/14 per after hr vm.

## 2014-08-19 ENCOUNTER — Ambulatory Visit (INDEPENDENT_AMBULATORY_CARE_PROVIDER_SITE_OTHER): Payer: Medicare Other | Admitting: Physician Assistant

## 2014-08-19 ENCOUNTER — Encounter: Payer: Self-pay | Admitting: Physician Assistant

## 2014-08-19 VITALS — BP 140/60 | HR 61 | Ht 74.0 in | Wt 212.0 lb

## 2014-08-19 DIAGNOSIS — I10 Essential (primary) hypertension: Secondary | ICD-10-CM

## 2014-08-19 DIAGNOSIS — R21 Rash and other nonspecific skin eruption: Secondary | ICD-10-CM

## 2014-08-19 DIAGNOSIS — I2510 Atherosclerotic heart disease of native coronary artery without angina pectoris: Secondary | ICD-10-CM

## 2014-08-19 DIAGNOSIS — E785 Hyperlipidemia, unspecified: Secondary | ICD-10-CM

## 2014-08-19 NOTE — Progress Notes (Signed)
Cardiology Office Note   Date:  08/19/2014   ID:  Dash, Cardarelli Aug 17, 1940, MRN [id number]  PCP:  Dema Severin, NP  Cardiologist:  Dr. Marca Ancona     Chief Complaint  Patient presents with  . Coronary Artery Disease  . Hospitalization Follow-up    s/p NSTEMI >> PCI with DES to LAD and DES x 2 to CFX.     History of Present Illness: Joseph [name] is a 74 y.o. male who presents for FU on the above.   With a history of CAD s/p anterior MI in 2004, s/p ant MI 2004 >> pLAD stent and staged RCA stent (Taxus), HTN, DM2, HL. He had a nonischemic myoview in 1/13. Last seen by Dr. Marca Ancona 08/2013.    Admitted 1/15-1/16 with a NSTEMI.  He underwent LHC that demonstrated severe restenosis in the mid LAD which was treated with a DES and sequential severe lesions in the mid and distal CFX - both areas were treated with DES. RCA stent was patent and there was some disease in a small OM branch. EF remained preserved at 55%.  Beta blocker was reduced at DC due to bradycardia.    He is doing well.  No further chest pain.  He has had some intermittent dyspnea that he thinks is related to the Brilinta.  He denies significant DOE.  He is NYHA 2-2b.  He has a lot of hip pain and his walking is limited by this.  He is not sure if this is related to statin Rx or hip DJD.  He denies orthopnea, PND, edema.  He denies syncope.     Studies/Reports Reviewed Today:  - LHC (1/16):  mLAD stent with diff restenosis, prox/mid Dx with mod disease (jailed by stent), small OM1 99, mCFX 75, dCFX 80, mRCA stent ok, EF 55% >> PCI:  2.25 x 24 mm Synergy DES to the mLAD and 3.5 x 16 mm Synergy DES to dCFX and 4 x 16 mm Synergy DES to mCFX    Past Medical History: 1. 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 s/p back surgery.  2. Diabetes mellitus 3. Hypertension: had side effects with chlorthalidone.  4. Hyperlipidemia: intolerant to multiple statins due to muscle pain.  He is able to tolerate lovastatin.  5. CAD: Anterior MI in 2004. Patient had LAD and RCA PCI at the time. Last myoview was in 7/07: EF 62%, small area of anteroapical infarct. Echo (3/11): EF 60%, normal wall motion, mild MR, mild left atrial enlargement, mildly dilated ascending aorta. Myoview (1/13): Small fixed apical septal defect with no ischemia, EF 58%.  6. Arterial dopplers (3/11): no evidence for significant PAD. ABIs 10/12: Normal.  7. Allergic rhinitis 8. Abdominal discomfort: EGD 2011 showed no significant abnormalities. Possibly due to metformin.  9. C-spine arthritis 10. 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 Carotid dopplers in 1/13 showed minimal disease.  Past Medical History  Diagnosis Date  . 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  s/p back surgery  . DM (diabetes mellitus)   . HTN (hypertension)     had side effects with chlorthalidone  . HLD (hyperlipidemia)   . Allergic rhinitis   . CAD (coronary artery disease)     a. s/p MI in 2004:  stents to the LAD and staged stent RCA, EF 55%;  b.  NSTEMI (1/16):  LHC - mid LAD stent with diff dist restenosis, prox to mid Dx mod dsz jailed by stent, small OM1 99, mCFX 75, dCFX 80, mRCA stent ok, mPDA mild to mod dsz, EF 55% >> PCI:  Synergy DES to LAD; Synergy DES (x2) mid and dist CFX  . Hx of echocardiogram     Echo (3/11): EF 60%, normal wall motion, mild MR, mild left atrial enlargement, mildly dilated ascending aorta.  [name]. Hx of cardiovascular stress test     Myoview (1/13): Small fixed apical septal defect with no ischemia, EF 58%.  [name]. History of Doppler ultrasound     Arterial dopplers (3/11): no evidence for significant PAD. ABIs 10/12: Normal.    . History of CVA (cerebrovascular accident)     Small CVA seen by head CT (incidental) in 1/13.  Carotid dopplers in 1/13 showed minimal disease  .  Abdominal discomfort     Abdominal discomfort: EGD 2011 showed no significant abnormalities.  Possibly due to metformin.    Past Surgical History  Procedure Laterality Date  . Coronary stent placement  2004    x2 LAD and RCA   . Prostate surgery    . Spinal fusion      L4-S1  . Left heart catheterization with coronary angiogram N/A 08/02/2014    Procedure: LEFT HEART CATHETERIZATION WITH CORONARY ANGIOGRAM;  Surgeon: Corky CraftsJayadeep S Varanasi, MD;  Location: [facility] CATH LAB;  Service: Cardiovascular;  Laterality: N/A;     Current Outpatient Prescriptions  Medication Sig Dispense Refill  . acetaminophen (TYLENOL) 650 MG CR tablet Take 1,300 mg by mouth every 8 (eight) hours as needed for pain.    [name]. amLODipine (NORVASC) 10 MG tablet TAKE ONE TABLET BY MOUTH ONCE DAILY 90 tablet 1  . aspirin 81 MG tablet Take 81 mg by mouth 2 (two) times daily.     . Coenzyme Q10 (CO Q 10 PO) Take 1 tablet by mouth daily.     [name]. glipiZIDE (GLUCOTROL) 10 MG tablet Take 10 mg by mouth 2 (two) times daily before a meal.      . Glycerin-Polysorbate 80 (REFRESH DRY EYE THERAPY OP) Apply 1 drop to eye 2 (two) times daily.    Boris Lown. Krill Oil 300 MG CAPS Take 300 mg by mouth 2 (two) times daily.    [name]. LEVEMIR FLEXTOUCH 100 UNIT/ML Pen Inject 15 Units into the skin 2 (two) times daily.     [name]. lisinopril (PRINIVIL,ZESTRIL) 40 MG tablet Take 40 mg by mouth daily.      . metoprolol (LOPRESSOR) 25 MG tablet Take 0.5 tablets (12.5 mg total) by mouth 2 (two) times daily. 30 tablet 11  . nitroGLYCERIN (NITROSTAT) 0.4 MG SL tablet Place 0.4 mg under the tongue every 5 (five) minutes as needed. For chest pain    . pravastatin (PRAVACHOL) 20 MG tablet Take 1 tablet (20 mg total) by mouth daily at 6 PM. 30 tablet 11  . ranitidine (ZANTAC) 150 MG tablet Take 150 mg by mouth 2 (two) times daily.      . ticagrelor (BRILINTA) 90 MG TABS tablet Take 1 tablet (90 mg total) by mouth 2 (two) times daily. 60 tablet 11   No current  facility-administered medications for this visit.    Allergies:   Review of patient's allergies indicates no known allergies.    Social History:  The patient  reports that he has never smoked. He has never used smokeless tobacco. He reports that he  does not drink alcohol or use illicit drugs.   Family History:  The patient's family history includes Heart attack in his father and mother. There is no history of Stroke.    ROS:  Please see the history of present illness.   Otherwise, review of systems are positive for muscle pain, rash on R wrist, easy bruising.   All other systems are reviewed and negative.    PHYSICAL EXAM: VS:  BP 140/60 mmHg  Pulse 61  Ht  (1.88 m)  Wt 212 lb (96.163 kg)  BMI 27.21 kg/m2    Wt Readings from Last 3 Encounters:  08/19/14 212 lb (96.163 kg)  08/03/14 178 lb 5.6 oz (80.9 kg)  08/30/13 212 lb (96.163 kg)     GEN: Well nourished, well developed, in no acute distress HEENT: normal Neck: no JVD, no masses Cardiac:  Normal S1/S2, RRR; no murmur, no rubs or gallops, no edema right wrist without hematoma or mass  Respiratory:  clear to auscultation bilaterally, no wheezing, rhonchi or rales. GI: soft, nontender, nondistended, + BS MS: no deformity or atrophy Skin: warm and dry maculopapular rash about R wrist Neuro:  CNs II-XII intact, Strength and sensation are intact Psych: Normal affect   EKG:  EKG is ordered today.  It demonstrates:   NSR, HR 63, normal axis, septal Q waves, no change from prior tracing.    Recent Labs: 08/02/2014: TSH 1.633 08/03/2014: ALT 20; BUN 22; Creatinine 1.12; Hemoglobin 12.4*; Platelets 211; Potassium 3.9; Sodium 137    Lipid Panel    Component Value Date/Time   CHOL 152 08/03/2014 0324   TRIG 240* 08/03/2014 0324   HDL 25* 08/03/2014 0324   CHOLHDL 6.1 08/03/2014 0324   VLDL 48* 08/03/2014 0324   LDLCALC 79 08/03/2014 0324   LDLDIRECT 111.9 04/18/2007 0820      ASSESSMENT AND PLAN:  1.  Coronary  Artery Disease:  Doing well since most recent NSTEMI tx with DES to the LAD and CFX.  He has had some intermittent dyspnea that is likely related to the Brilinta.  He does not feel like it is bad enough to change medications.  He has not yet started cardiac rehab at [facility].    -  Refer to rehab at [facility].    -  Continue ASA, Brilinta.  If dyspnea continues, consider changing Brilinta to Plavix.    -  Continue beta blocker, statin, Amlodipine, Lisinopril. 2.  Hypertension:  BP optimal at home.  He is tolerating his current regimen. 3.  Hyperlipidemia:  It is not certain if his leg pain is related to statin Rx.  If it continues consider changing to Crestor 10 mg on MWF only.      -  Check Lipids and LFTs in 6 weeks.   4.  Rash:  This looks like contact dermatitis.  Treat conservatively.      -  OTC hydrocortisone and loratadine.   Current medicines are reviewed at length with the patient today.  The patient has concerns regarding medicines.  The following changes have been made:  As above.   Labs/ tests ordered today include:   Orders Placed This Encounter  Procedures  . EKG 12-Lead     Disposition:   FU with Dr. Marca Ancona  in 8 weeks   Signed, Brynda Rim, MHS 08/19/2014 3:42 PM    [facility] [address] [place], [place], [place]  [address] Phone: [phone]; Fax: [phone]

## 2014-08-19 NOTE — Patient Instructions (Addendum)
For your rash, you can use: Hydrocortisone Cream 1% - apply sparingly to the affected area 1 to 2 times a day.  Use for 5-7 days. Claritin 10 mg once daily for 5 to 7 days.  Your physician recommends that you schedule a follow-up appointment in: 6-8 WEEKS WITH DR. Shirlee LatchMCLEAN  You have been referred to [facility]  YOU HAVE BEEN GIVEN AN RX FOR LAB WORK TO BE DONE WITH YOUR PRIMARY CARE PHYSICIAN WITH THE RESULTS TO BE FAXED TO [place] [phone]

## 2014-09-13 ENCOUNTER — Other Ambulatory Visit: Payer: BLUE CROSS/BLUE SHIELD

## 2014-09-24 ENCOUNTER — Encounter: Payer: Self-pay | Admitting: Cardiology

## 2014-10-28 ENCOUNTER — Encounter: Payer: Self-pay | Admitting: *Deleted

## 2014-10-28 ENCOUNTER — Encounter: Payer: Self-pay | Admitting: Cardiology

## 2014-10-28 ENCOUNTER — Ambulatory Visit (INDEPENDENT_AMBULATORY_CARE_PROVIDER_SITE_OTHER): Payer: Medicare Other | Admitting: Cardiology

## 2014-10-28 VITALS — BP 128/68 | HR 66 | Ht 74.0 in | Wt 203.0 lb

## 2014-10-28 DIAGNOSIS — R0602 Shortness of breath: Secondary | ICD-10-CM | POA: Diagnosis not present

## 2014-10-28 DIAGNOSIS — E7800 Pure hypercholesterolemia, unspecified: Secondary | ICD-10-CM

## 2014-10-28 DIAGNOSIS — R079 Chest pain, unspecified: Secondary | ICD-10-CM

## 2014-10-28 DIAGNOSIS — I2510 Atherosclerotic heart disease of native coronary artery without angina pectoris: Secondary | ICD-10-CM

## 2014-10-28 DIAGNOSIS — I10 Essential (primary) hypertension: Secondary | ICD-10-CM | POA: Diagnosis not present

## 2014-10-28 NOTE — Patient Instructions (Signed)
Medication Instructions:   no changes today.  Labwork: Your physician recommends that you have a lipid profile/BMET/BNP today.   Testing/Procedures: Your physician has requested that you have an echocardiogram. Echocardiography is a painless test that uses sound waves to create images of your heart. It provides your doctor with information about the size and shape of your heart and how well your heart's chambers and valves are working. This procedure takes approximately one hour. There are no restrictions for this procedure.  Your physician has requested that you have en exercise stress myoview. For further information please visit [email or web address]. Please follow instruction sheet, as given.     Follow-Up: Your physician recommends that you schedule a follow-up appointment in 1 month with Brynda RimScott Weaver,PA,c on a day Dr Shirlee LatchMcLean is in the office.

## 2014-10-29 LAB — BASIC METABOLIC PANEL
BUN: 26 mg/dL — ABNORMAL HIGH (ref 6–23)
CALCIUM: 10.4 mg/dL (ref 8.4–10.5)
CO2: 26 mEq/L (ref 19–32)
CREATININE: 1.33 mg/dL (ref 0.40–1.50)
Chloride: 104 mEq/L (ref 96–112)
GFR: 55.55 mL/min — AB (ref >60.00)
GLUCOSE: 175 mg/dL — AB (ref 70–99)
Potassium: 5.4 mEq/L — ABNORMAL HIGH (ref 3.5–5.1)
SODIUM: 138 meq/L (ref 135–145)

## 2014-10-29 LAB — BRAIN NATRIURETIC PEPTIDE: Pro B Natriuretic peptide (BNP): 61 pg/mL (ref 0.0–100.0)

## 2014-10-29 LAB — LDL CHOLESTEROL, DIRECT: Direct LDL: 83 mg/dL

## 2014-10-29 LAB — LIPID PANEL
Cholesterol: 154 mg/dL (ref 0–200)
HDL: 37.1 mg/dL — AB (ref >39.00)
NONHDL: 116.9
Total CHOL/HDL Ratio: 4
Triglycerides: 264 mg/dL — ABNORMAL HIGH (ref 0.0–149.0)
VLDL: 52.8 mg/dL — ABNORMAL HIGH (ref 0.0–40.0)

## 2014-10-29 NOTE — Progress Notes (Signed)
Patient ID: Joseph [name], male   DOB: 07/13/1941, 74 y.o.   MRN: [id number] PCP: Regina York ([place])  74 yo with history of CAD s/p anterior MI in 2004 and LAD/RCA PCIs as well as NSTEMI in 1/16 with mLAD and m/dLCx PCIs  presents for cardiology followup.   He had recurrent NSTEMI in 1/16.  LHC showed severe in-stent restenosis in the mLAD and severe disease in the mid to distal LCx.  He had PCI with DES to mLAD and DES to mid and distal LCx.  He then started cardiac rehab.    Initially, in cardiac rehab at  Hospital, he was doing well.  However, about a month ago, he developed chest burning whenever he would walk or ride an exercise bike.  He did not tell anyone about this.  This lasted for about a week and has now resolved (no longer getting the chest pain). However, since then, he has developed generalized weakness.  He is weak and fatigued when walking or riding the exercise bike.  This has been going on for about 3 wks now.  He is not particularly short of breath/gasping for air, just very fatigued.  No palpitations, syncope, orthopnea/PND.  No myalgias on pravastatin.    Labs (3/11): LDL 90, HDL 40, K 4.4, creatinine 1.0 Labs (4/11): K 5.1, creatinine 1.4 Labs (9/11): K 4.5, creatinine 0.97, LDL 83, HDL 35 Labs (3/12): HDL 29, LDL 74, K 4.6, creatinine 1.1 Labs (9/12): LDL 60, HDL 36 Labs (1/13): Cardiac enzymes negative, K 5.3, creatinine 1.28 Labs (2/14): K 4.9, creatinine 1.4, LDL 61, HDL 29 Labs (1/16): LDL 79, HDL 25  ECG: sinus bradycardia, 1st degree AV block, old ASMI  Past Medical History: 1. 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  s/p back surgery.  2. Diabetes mellitus 3. Hypertension: had side effects with chlorthalidone.  4. Hyperlipidemia: intolerant to multiple statins due to muscle pain.  He is able to tolerate lovastatin and pravastatin.  5. CAD: Anterior MI in 2004.  Patient had LAD and RCA PCI at the time.  Last myoview was in  7/07: EF 62%, small area of anteroapical infarct.  Echo (3/11): EF 60%, normal wall motion, mild MR, mild left atrial enlargement, mildly dilated ascending aorta.   Myoview (1/13): Small fixed apical septal defect with no ischemia, EF 58%. He had recurrent NSTEMI in 1/16.  LHC showed severe in-stent restenosis in the mLAD and severe disease in the mid to distal LCx.  He had PCI with DES to mLAD and DES to mid and distal LCx. 6.  Arterial dopplers (3/11): no evidence for significant PAD. ABIs 10/12: Normal.  7.  Allergic rhinitis 8.  Abdominal discomfort: EGD 2011 showed no significant abnormalities.  Possibly due to metformin.  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  Carotid dopplers in 1/13 showed minimal disease.   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  Tobacco Use - No.  Alcohol Use - no Drug Use - no  ROS: All systems reviewed and negative except as per HPI.   Current Outpatient Prescriptions  Medication Sig Dispense Refill  . acetaminophen (TYLENOL) 650 MG CR tablet Take 1,300 mg by mouth every 8 (eight) hours as needed for pain.    . amLODipine (NORVASC) 10 MG tablet TAKE ONE TABLET BY MOUTH ONCE DAILY 90 tablet 1  .   aspirin 81 MG tablet Take 81 mg by mouth 2 (two) times daily.     . Coenzyme Q10 (CO Q 10 PO) Take 1 tablet by mouth daily.     . glipiZIDE (GLUCOTROL) 10 MG tablet Take 10 mg by mouth 2 (two) times daily before a meal.      . Glycerin-Polysorbate 80 (REFRESH DRY EYE THERAPY OP) Apply 1 drop to eye 2 (two) times daily.    . Krill Oil 300 MG CAPS Take 300 mg by mouth 2 (two) times daily.    . LEVEMIR FLEXTOUCH 100 UNIT/ML Pen Inject 15 Units into the skin 2 (two) times daily.     . lisinopril (PRINIVIL,ZESTRIL) 40 MG tablet Take 40 mg by mouth daily.      . metoprolol (LOPRESSOR) 25 MG tablet Take 0.5 tablets (12.5 mg total) by mouth 2  (two) times daily. 30 tablet 11  . nitroGLYCERIN (NITROSTAT) 0.4 MG SL tablet Place 0.4 mg under the tongue every 5 (five) minutes as needed. For chest pain    . pravastatin (PRAVACHOL) 20 MG tablet Take 1 tablet (20 mg total) by mouth daily at 6 PM. 30 tablet 11  . ranitidine (ZANTAC) 150 MG tablet Take 150 mg by mouth 2 (two) times daily.      . ticagrelor (BRILINTA) 90 MG TABS tablet Take 1 tablet (90 mg total) by mouth 2 (two) times daily. 60 tablet 11   No current facility-administered medications for this visit.    BP 128/68 mmHg  Pulse 66  Ht 6' 2" (1.88 m)  Wt 203 lb (92.08 kg)  BMI 26.05 kg/m2 General: NAD Neck: No JVD, no thyromegaly or thyroid nodule.  Lungs: Clear to auscultation bilaterally with normal respiratory effort. CV: Nondisplaced PMI.  Heart regular S1/S2, no S3, no murmur.  +S4.  No peripheral edema.  No carotid bruit.  Difficult to palpate PT pulses.  Abdomen: Soft, nontender, no hepatosplenomegaly, no distention.  Neurologic: Alert and oriented x 3.  Psych: Normal affect. Extremities: No clubbing or cyanosis.   Assessment/Plan:  Bradycardia  HR stable on current low dose of metoprolol.  CAD  Recent NSTEMI with DES x 3 in 1/16 to mLAD and m/dLCx.  I am concerned that he could have had an event about 4 weeks ago when he was having episodes of chest burning with exertion.  He did not tell anyone.  Now, he no longer has chest pain but has significant fatigue with exertion.  He does not have prominent dyspnea, so I do not think that this is a Brilinta effect.  - I will arrange for ETT-Cardiolite.  - I will have him get an echocardiogram, check BNP.  - Continue ASA 81, Brilinta, pravastatin, lisinopril, metoprolol.  HYPERTENSION  BP is well-controlled on current regimen.  HYPERCHOLESTEROLEMIA Tolerating pravastatin without myalgias.  I will check lipids today.   Dalton McLean 10/29/2014    

## 2014-10-30 ENCOUNTER — Encounter: Payer: Self-pay | Admitting: Cardiology

## 2014-10-30 ENCOUNTER — Encounter: Payer: Self-pay | Admitting: Cardiovascular Disease

## 2014-10-30 ENCOUNTER — Other Ambulatory Visit: Payer: Self-pay | Admitting: *Deleted

## 2014-10-30 DIAGNOSIS — I10 Essential (primary) hypertension: Secondary | ICD-10-CM

## 2014-10-30 DIAGNOSIS — E7800 Pure hypercholesterolemia, unspecified: Secondary | ICD-10-CM

## 2014-10-31 ENCOUNTER — Ambulatory Visit (HOSPITAL_BASED_OUTPATIENT_CLINIC_OR_DEPARTMENT_OTHER): Payer: Medicare Other | Admitting: Radiology

## 2014-10-31 ENCOUNTER — Ambulatory Visit (HOSPITAL_COMMUNITY): Payer: Medicare Other | Attending: Cardiology | Admitting: Radiology

## 2014-10-31 DIAGNOSIS — R0602 Shortness of breath: Secondary | ICD-10-CM | POA: Diagnosis not present

## 2014-10-31 DIAGNOSIS — R079 Chest pain, unspecified: Secondary | ICD-10-CM | POA: Insufficient documentation

## 2014-10-31 DIAGNOSIS — I071 Rheumatic tricuspid insufficiency: Secondary | ICD-10-CM | POA: Diagnosis not present

## 2014-10-31 MED ORDER — TECHNETIUM TC 99M SESTAMIBI GENERIC - CARDIOLITE
11.0000 | Freq: Once | INTRAVENOUS | Status: AC | PRN
  Administered 2014-10-31: 11 via INTRAVENOUS

## 2014-10-31 MED ORDER — TECHNETIUM TC 99M SESTAMIBI GENERIC - CARDIOLITE
33.0000 | Freq: Once | INTRAVENOUS | Status: AC | PRN
  Administered 2014-10-31: 33 via INTRAVENOUS

## 2014-10-31 NOTE — Progress Notes (Addendum)
[facility] SITE 3 NUCLEAR MED [address] [place], [place] [address] [phone]    Cardiology Nuclear Med Study  Joseph [name] is a 74 y.o. male     MRN : [id number]     DOB: 1940-10-02  Procedure Date: 10/31/2014  Nuclear Med Background Indication for Stress Test:  Evaluation for Ischemia and Stent Patency History:  CAD, MPI 2013 (small anterapical infarct) EF 58% Cardiac Risk Factors: Hypertension and IDDM   Symptoms:  Chest Pain with Exertion (last date of chest discomfort was back in January) and Fatigue   Nuclear Pre-Procedure Caffeine/Decaff Intake:  None> 12 hrs NPO After: 5:00am   Lungs:  clear O2 Sat: 97% on room air. IV 0.9% NS with Angio Cath:  22g  IV Site: R Antecubital x 1, tolerated well IV Started by:  Irean Hong, RN  Chest Size (in):  46 Cup Size: n/a  Height:  (1.88 m)  Weight:  201 lb (91.173 kg)  BMI:  Body mass index is 25.8 kg/(m^2). Tech Comments: Patient held Metoprolol x 48 hrs.  1/2 dose of Levemir Insulin last night, and no insulin,or Glipizide this am. Fasting CBG was 140 at 0500 today. Irean Hong, RN.    Nuclear Med Study 1 or 2 day study: 1 day  Stress Test Type:  Stress  Reading MD: N/A  Order Authorizing Provider:  Marca Ancona, MD  Resting Radionuclide: Technetium 35m Sestamibi  Resting Radionuclide Dose: 11.0 mCi   Stress Radionuclide:  Technetium 76m Sestamibi  Stress Radionuclide Dose: 33.0 mCi           Stress Protocol Rest HR: 59 Stress HR: 141  Rest BP: 144/77 Stress BP: 170/67  Exercise Time (min): 6:00 METS: 7.0   Predicted Max HR: 144 bpm % Max HR: 97.92 bpm Rate Pressure Product: 21308   Dose of Adenosine (mg):  n/a Dose of Lexiscan: n/a mg  Dose of Atropine (mg): n/a Dose of Dobutamine: n/a mcg/kg/min (at max HR)  Stress Test Technologist: Nelson Chimes, BS-ES  Nuclear Technologist:  Kerby Nora, CNMT     Rest Procedure:  Myocardial perfusion imaging was performed at rest 45 minutes  following the intravenous administration of Technetium 62m Sestamibi. Rest ECG: Sinus bradycardia  59 bpm    Stress Procedure:  The patient exercised on the treadmill utilizing the Bruce Protocol for 6:00 minutes. The patient stopped due to 6/10 chest pressure.  Chest pressure resolved at approximately two minutes into recovery. Technetium 85m Sestamibi was injected at peak exercise and myocardial perfusion imaging was performed after a brief delay.  Stress ECG: No significant change from baseline ECG  QPS Raw Data Images:  Soft tissue (diaphragm) underlies heart.  Stress Images:Defect in the septal wall (mid, distal); decreased tracer activity in the anterolateral wall (base, mid), inferolateral wall (base).    Rest Images: Improvement with increased tracer activity in the anteorolateral wall (base, mid).  Otherwise no significant change.   Subtraction (SDS):  Does not appear signif for ischemia   Transient Ischemic Dilatation (Normal <1.22):  0.91 Lung/Heart Ratio (Normal <0.45):  0.26  Quantitative Gated Spect Images QGS EDV:  110 ml QGS ESV:  43 ml  Impression Exercise Capacity:  Fair exercise capacity. BP Response:  Normal blood pressure response. Clinical Symptoms: Moderate chest tightness ECG Impression:  No significant ST segment change suggestive of ischemia. Comparison with Prior Nuclear Study:Small anteroapical scar on previous scan   Overall Impression:  Small area of mild anterolateral ischemia (base, mid);  small area of septal scar.  Inferolateral defect(small) consistent with probable soft tissue attenuation.   Current scan is different from report of prior nuclear study.  Unable to compare images.  Overall intermediate risk scan    LV Ejection Fraction: 61%.  LV Wall Motion:  NL LV Function; NL Wall Motion   Dietrich PatesPaula Oisin Yoakum  Intermediate risk study, anterolateral ischemia and inferolateral defect of uncertain etiology.  Different from prior study. Given symptoms, I think  that he is probably going to need a re-look cath.  Can arrange for later this week or next week. Continue meds.  Will need all pre-cath labs.   Marca AnconaDalton McLean 11/04/2014 3:27 PM

## 2014-10-31 NOTE — Progress Notes (Signed)
Echocardiogram performed.  

## 2014-11-01 ENCOUNTER — Telehealth: Payer: Self-pay | Admitting: *Deleted

## 2014-11-01 NOTE — Telephone Encounter (Signed)
Patient informed. 

## 2014-11-01 NOTE — Telephone Encounter (Signed)
-----   Message from Laurey Moralealton S McLean, MD sent at 10/31/2014  8:51 PM EDT ----- Normal EF, mildly dilated aortic root, moderate TR. No marked abnormalities

## 2014-11-04 ENCOUNTER — Other Ambulatory Visit: Payer: Self-pay | Admitting: *Deleted

## 2014-11-04 ENCOUNTER — Encounter: Payer: Self-pay | Admitting: *Deleted

## 2014-11-04 DIAGNOSIS — R079 Chest pain, unspecified: Secondary | ICD-10-CM

## 2014-11-04 DIAGNOSIS — R9430 Abnormal result of cardiovascular function study, unspecified: Secondary | ICD-10-CM

## 2014-11-04 MED ORDER — PRAVASTATIN SODIUM 40 MG PO TABS: 40.0000 mg | ORAL_TABLET | Freq: Every evening | ORAL | Status: DC

## 2014-11-04 NOTE — Progress Notes (Signed)
Pt agreed to schedule cardiac cath-this is scheduled for 11/13/14 at pt's request.

## 2014-11-07 ENCOUNTER — Other Ambulatory Visit (INDEPENDENT_AMBULATORY_CARE_PROVIDER_SITE_OTHER): Payer: Medicare Other | Admitting: *Deleted

## 2014-11-07 DIAGNOSIS — R9430 Abnormal result of cardiovascular function study, unspecified: Secondary | ICD-10-CM

## 2014-11-07 DIAGNOSIS — R079 Chest pain, unspecified: Secondary | ICD-10-CM

## 2014-11-07 DIAGNOSIS — I10 Essential (primary) hypertension: Secondary | ICD-10-CM

## 2014-11-07 LAB — CBC WITH DIFFERENTIAL/PLATELET
Basophils Absolute: 0 10*3/uL (ref 0.0–0.1)
Basophils Relative: 0.4 % (ref 0.0–3.0)
EOS ABS: 0.2 10*3/uL (ref 0.0–0.7)
Eosinophils Relative: 2.7 % (ref 0.0–5.0)
HEMATOCRIT: 37.5 % — AB (ref 39.0–52.0)
HEMOGLOBIN: 13.1 g/dL (ref 13.0–17.0)
LYMPHS ABS: 1.7 10*3/uL (ref 0.7–4.0)
Lymphocytes Relative: 21.9 % (ref 12.0–46.0)
MCHC: 35 g/dL (ref 30.0–36.0)
MCV: 89.8 fl (ref 78.0–100.0)
MONO ABS: 0.5 10*3/uL (ref 0.1–1.0)
MONOS PCT: 6.7 % (ref 3.0–12.0)
NEUTROS ABS: 5.2 10*3/uL (ref 1.4–7.7)
Neutrophils Relative %: 68.3 % (ref 43.0–77.0)
PLATELETS: 244 10*3/uL (ref 150.0–400.0)
RBC: 4.18 Mil/uL — ABNORMAL LOW (ref 4.22–5.81)
RDW: 13.1 % (ref 11.5–15.5)
WBC: 7.6 10*3/uL (ref 4.0–10.5)

## 2014-11-07 LAB — PROTIME-INR
INR: 1 ratio (ref 0.8–1.0)
Prothrombin Time: 11.5 s (ref 9.6–13.1)

## 2014-11-07 LAB — BASIC METABOLIC PANEL
BUN: 26 mg/dL — ABNORMAL HIGH (ref 6–23)
CALCIUM: 9.7 mg/dL (ref 8.4–10.5)
CO2: 27 mEq/L (ref 19–32)
Chloride: 105 mEq/L (ref 96–112)
Creatinine, Ser: 1.27 mg/dL (ref 0.40–1.50)
GFR: 58.59 mL/min — AB (ref >60.00)
GLUCOSE: 161 mg/dL — AB (ref 70–99)
Potassium: 4.5 mEq/L (ref 3.5–5.1)
Sodium: 138 mEq/L (ref 135–145)

## 2014-11-07 NOTE — Addendum Note (Signed)
Addended by: Tonita PhoenixBOWDEN, ROBIN K on: 11/07/2014 09:34 AM   Modules accepted: Orders

## 2014-11-13 ENCOUNTER — Encounter (HOSPITAL_COMMUNITY)
Admission: RE | Disposition: A | Discharge status: Home / Self Care | Payer: Self-pay | Source: Ambulatory Visit | Attending: Cardiology

## 2014-11-13 ENCOUNTER — Encounter (HOSPITAL_COMMUNITY): Payer: Self-pay | Admitting: Cardiology

## 2014-11-13 ENCOUNTER — Ambulatory Visit (HOSPITAL_COMMUNITY)
Admission: RE | Admit: 2014-11-13 | Discharge: 2014-11-13 | Disposition: A | Discharge status: Home / Self Care | Payer: Medicare Other | Source: Ambulatory Visit | Attending: Cardiology | Admitting: Cardiology

## 2014-11-13 DIAGNOSIS — I440 Atrioventricular block, first degree: Secondary | ICD-10-CM | POA: Diagnosis not present

## 2014-11-13 DIAGNOSIS — E780 Pure hypercholesterolemia: Secondary | ICD-10-CM | POA: Insufficient documentation

## 2014-11-13 DIAGNOSIS — E785 Hyperlipidemia, unspecified: Secondary | ICD-10-CM | POA: Diagnosis not present

## 2014-11-13 DIAGNOSIS — I10 Essential (primary) hypertension: Secondary | ICD-10-CM | POA: Insufficient documentation

## 2014-11-13 DIAGNOSIS — Z955 Presence of coronary angioplasty implant and graft: Secondary | ICD-10-CM | POA: Diagnosis not present

## 2014-11-13 DIAGNOSIS — Z8673 Personal history of transient ischemic attack (TIA), and cerebral infarction without residual deficits: Secondary | ICD-10-CM | POA: Insufficient documentation

## 2014-11-13 DIAGNOSIS — M47812 Spondylosis without myelopathy or radiculopathy, cervical region: Secondary | ICD-10-CM | POA: Diagnosis not present

## 2014-11-13 DIAGNOSIS — I252 Old myocardial infarction: Secondary | ICD-10-CM | POA: Diagnosis not present

## 2014-11-13 DIAGNOSIS — I2510 Atherosclerotic heart disease of native coronary artery without angina pectoris: Secondary | ICD-10-CM | POA: Diagnosis not present

## 2014-11-13 DIAGNOSIS — Z7982 Long term (current) use of aspirin: Secondary | ICD-10-CM | POA: Insufficient documentation

## 2014-11-13 DIAGNOSIS — E119 Type 2 diabetes mellitus without complications: Secondary | ICD-10-CM | POA: Insufficient documentation

## 2014-11-13 HISTORY — PX: CORONARY ANGIOGRAM: SHX5466

## 2014-11-13 LAB — GLUCOSE, CAPILLARY: GLUCOSE-CAPILLARY: 114 mg/dL — AB (ref 70–99)

## 2014-11-13 SURGERY — CORONARY ANGIOGRAM

## 2014-11-13 MED ORDER — NITROGLYCERIN 1 MG/10 ML FOR IR/CATH LAB
INTRA_ARTERIAL | Status: AC
  Filled 2014-11-13: qty 10

## 2014-11-13 MED ORDER — SODIUM CHLORIDE 0.9 % IJ SOLN: 3.0000 mL | Freq: Two times a day (BID) | INTRAMUSCULAR | Status: DC

## 2014-11-13 MED ORDER — ASPIRIN 81 MG PO CHEW
CHEWABLE_TABLET | ORAL | Status: AC
  Filled 2014-11-13: qty 1

## 2014-11-13 MED ORDER — VERAPAMIL HCL 2.5 MG/ML IV SOLN
INTRAVENOUS | Status: AC
  Filled 2014-11-13: qty 2

## 2014-11-13 MED ORDER — SODIUM CHLORIDE 0.9 % IJ SOLN: 3.0000 mL | INTRAMUSCULAR | Status: DC | PRN

## 2014-11-13 MED ORDER — SODIUM CHLORIDE 0.9 % IV SOLN: 250.0000 mL | INTRAVENOUS | Status: DC | PRN

## 2014-11-13 MED ORDER — HEPARIN (PORCINE) IN NACL 2-0.9 UNIT/ML-% IJ SOLN
INTRAMUSCULAR | Status: AC
  Filled 2014-11-13: qty 1000

## 2014-11-13 MED ORDER — SODIUM CHLORIDE 0.9 % IV SOLN
INTRAVENOUS | Status: DC
  Administered 2014-11-13: 09:00:00 via INTRAVENOUS

## 2014-11-13 MED ORDER — ONDANSETRON HCL 4 MG/2ML IJ SOLN: 4.0000 mg | Freq: Four times a day (QID) | INTRAMUSCULAR | Status: DC | PRN

## 2014-11-13 MED ORDER — ACETAMINOPHEN 325 MG PO TABS: 650.0000 mg | ORAL_TABLET | ORAL | Status: DC | PRN

## 2014-11-13 MED ORDER — ASPIRIN 81 MG PO CHEW
81.0000 mg | CHEWABLE_TABLET | ORAL | Status: AC
  Administered 2014-11-13: 81 mg via ORAL

## 2014-11-13 MED ORDER — LIDOCAINE HCL (PF) 1 % IJ SOLN
INTRAMUSCULAR | Status: AC
  Filled 2014-11-13: qty 30

## 2014-11-13 MED ORDER — TICAGRELOR 90 MG PO TABS
ORAL_TABLET | ORAL | Status: AC
  Filled 2014-11-13: qty 1

## 2014-11-13 MED ORDER — MIDAZOLAM HCL 2 MG/2ML IJ SOLN
INTRAMUSCULAR | Status: AC
  Filled 2014-11-13: qty 2

## 2014-11-13 MED ORDER — FENTANYL CITRATE (PF) 100 MCG/2ML IJ SOLN
INTRAMUSCULAR | Status: AC
  Filled 2014-11-13: qty 2

## 2014-11-13 MED ORDER — HEPARIN SODIUM (PORCINE) 1000 UNIT/ML IJ SOLN
INTRAMUSCULAR | Status: AC
  Filled 2014-11-13: qty 1

## 2014-11-13 NOTE — Discharge Instructions (Signed)
Radial Site Care °Refer to this sheet in the next few weeks. These instructions provide you with information on caring for yourself after your procedure. Your caregiver may also give you more specific instructions. Your treatment has been planned according to current medical practices, but problems sometimes occur. Call your caregiver if you have any problems or questions after your procedure. °HOME CARE INSTRUCTIONS °· You may shower the day after the procedure. Remove the bandage (dressing) and gently wash the site with plain soap and water. Gently pat the site dry. °· Do not apply powder or lotion to the site. °· Do not submerge the affected site in water for 3 to 5 days. °· Inspect the site at least twice daily. °· Do not flex or bend the affected arm for 24 hours. °· No lifting over 5 pounds (2.3 kg) for 5 days after your procedure. °· Do not drive home if you are discharged the same day of the procedure. Have someone else drive you. °· You may drive 24 hours after the procedure unless otherwise instructed by your caregiver. °· Do not operate machinery or power tools for 24 hours. °· A responsible adult should be with you for the first 24 hours after you arrive home. °What to expect: °· Any bruising will usually fade within 1 to 2 weeks. °· Blood that collects in the tissue (hematoma) may be painful to the touch. It should usually decrease in size and tenderness within 1 to 2 weeks. °SEEK IMMEDIATE MEDICAL CARE IF: °· You have unusual pain at the radial site. °· You have redness, warmth, swelling, or pain at the radial site. °· You have drainage (other than a small amount of blood on the dressing). °· You have chills. °· You have a fever or persistent symptoms for more than 72 hours. °· You have a fever and your symptoms suddenly get worse. °· Your arm becomes pale, cool, tingly, or numb. °· You have heavy bleeding from the site. Hold pressure on the site. °Document Released: 08/07/2010 Document Revised:  09/27/2011 Document Reviewed: 08/07/2010 °ExitCare® Patient Information ©2015 [employer]. This information is not intended to replace advice given to you by your health care provider. Make sure you discuss any questions you have with your health care provider. ° °

## 2014-11-13 NOTE — Interval H&P Note (Signed)
History and Physical Interval Note:  11/13/2014 10:49 AM  Joseph [name]  has presented today for surgery, with the diagnosis of c/p  The various methods of treatment have been discussed with the patient and family. After consideration of risks, benefits and other options for treatment, the patient has consented to  Procedure(s): LEFT HEART CATHETERIZATION WITH CORONARY ANGIOGRAM (N/A) as a surgical intervention .  The patient's history has been reviewed, patient examined, no change in status, stable for surgery.  I have reviewed the patient's chart and labs.  Questions were answered to the patient's satisfaction.     [name]

## 2014-11-13 NOTE — H&P (View-Only) (Signed)
Patient ID: Joseph [name], male   DOB: 1940/12/05, 74 y.o.   MRN: [id number] PCP: [name] ([place])  74 yo with history of CAD s/p anterior MI in 2004 and LAD/RCA PCIs as well as NSTEMI in 1/16 with mLAD and m/dLCx PCIs  presents for cardiology followup.   He had recurrent NSTEMI in 1/16.  LHC showed severe in-stent restenosis in the mLAD and severe disease in the mid to distal LCx.  He had PCI with DES to mLAD and DES to mid and distal LCx.  He then started cardiac rehab.    Initially, in cardiac rehab at [facility], he was doing well.  However, about a month ago, he developed chest burning whenever he would walk or ride an exercise bike.  He did not tell anyone about this.  This lasted for about a week and has now resolved (no longer getting the chest pain). However, since then, he has developed generalized weakness.  He is weak and fatigued when walking or riding the exercise bike.  This has been going on for about 3 wks now.  He is not particularly short of breath/gasping for air, just very fatigued.  No palpitations, syncope, orthopnea/PND.  No myalgias on pravastatin.    Labs (3/11): LDL 90, HDL 40, K 4.4, creatinine 1.0 Labs (4/11): K 5.1, creatinine 1.4 Labs (9/11): K 4.5, creatinine 0.97, LDL 83, HDL 35 Labs (3/12): HDL 29, LDL 74, K 4.6, creatinine 1.1 Labs (9/12): LDL 60, HDL 36 Labs (1/13): Cardiac enzymes negative, K 5.3, creatinine 1.28 Labs (2/14): K 4.9, creatinine 1.4, LDL 61, HDL 29 Labs (1/16): LDL 79, HDL 25  ECG: sinus bradycardia, 1st degree AV block, old ASMI  Past Medical History: 1. 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  s/p back surgery.  2. Diabetes mellitus 3. Hypertension: had side effects with chlorthalidone.  4. Hyperlipidemia: intolerant to multiple statins due to muscle pain.  He is able to tolerate lovastatin and pravastatin.  5. CAD: Anterior MI in 2004.  Patient had LAD and RCA PCI at the time.  Last myoview was in  7/07: EF 62%, small area of anteroapical infarct.  Echo (3/11): EF 60%, normal wall motion, mild MR, mild left atrial enlargement, mildly dilated ascending aorta.   Myoview (1/13): Small fixed apical septal defect with no ischemia, EF 58%. He had recurrent NSTEMI in 1/16.  LHC showed severe in-stent restenosis in the mLAD and severe disease in the mid to distal LCx.  He had PCI with DES to mLAD and DES to mid and distal LCx. 6.  Arterial dopplers (3/11): no evidence for significant PAD. ABIs 10/12: Normal.  7.  Allergic rhinitis 8.  Abdominal discomfort: EGD 2011 showed no significant abnormalities.  Possibly due to metformin.  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  Carotid dopplers in 1/13 showed minimal disease.   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  Tobacco Use - No.  Alcohol Use - no Drug Use - no  ROS: All systems reviewed and negative except as per HPI.   Current Outpatient Prescriptions  Medication Sig Dispense Refill  . acetaminophen (TYLENOL) 650 MG CR tablet Take 1,300 mg by mouth every 8 (eight) hours as needed for pain.    [name] amLODipine (NORVASC) 10 MG tablet TAKE ONE TABLET BY MOUTH ONCE DAILY 90 tablet 1  .  aspirin 81 MG tablet Take 81 mg by mouth 2 (two) times daily.     . Coenzyme Q10 (CO Q 10 PO) Take 1 tablet by mouth daily.     [name]. glipiZIDE (GLUCOTROL) 10 MG tablet Take 10 mg by mouth 2 (two) times daily before a meal.      . Glycerin-Polysorbate 80 (REFRESH DRY EYE THERAPY OP) Apply 1 drop to eye 2 (two) times daily.    Boris Lown. Krill Oil 300 MG CAPS Take 300 mg by mouth 2 (two) times daily.    [name]. LEVEMIR FLEXTOUCH 100 UNIT/ML Pen Inject 15 Units into the skin 2 (two) times daily.     [name]. lisinopril (PRINIVIL,ZESTRIL) 40 MG tablet Take 40 mg by mouth daily.      . metoprolol (LOPRESSOR) 25 MG tablet Take 0.5 tablets (12.5 mg total) by mouth 2  (two) times daily. 30 tablet 11  . nitroGLYCERIN (NITROSTAT) 0.4 MG SL tablet Place 0.4 mg under the tongue every 5 (five) minutes as needed. For chest pain    . pravastatin (PRAVACHOL) 20 MG tablet Take 1 tablet (20 mg total) by mouth daily at 6 PM. 30 tablet 11  . ranitidine (ZANTAC) 150 MG tablet Take 150 mg by mouth 2 (two) times daily.      . ticagrelor (BRILINTA) 90 MG TABS tablet Take 1 tablet (90 mg total) by mouth 2 (two) times daily. 60 tablet 11   No current facility-administered medications for this visit.    BP 128/68 mmHg  Pulse 66  Ht 6\' 2"  (1.88 m)  Wt 203 lb (92.08 kg)  BMI 26.05 kg/m2 General: NAD Neck: No JVD, no thyromegaly or thyroid nodule.  Lungs: Clear to auscultation bilaterally with normal respiratory effort. CV: Nondisplaced PMI.  Heart regular S1/S2, no S3, no murmur.  +S4.  No peripheral edema.  No carotid bruit.  Difficult to palpate PT pulses.  Abdomen: Soft, nontender, no hepatosplenomegaly, no distention.  Neurologic: Alert and oriented x 3.  Psych: Normal affect. Extremities: No clubbing or cyanosis.   Assessment/Plan:  Bradycardia  HR stable on current low dose of metoprolol.  CAD  Recent NSTEMI with DES x 3 in 1/16 to mLAD and m/dLCx.  I am concerned that he could have had an event about 4 weeks ago when he was having episodes of chest burning with exertion.  He did not tell anyone.  Now, he no longer has chest pain but has significant fatigue with exertion.  He does not have prominent dyspnea, so I do not think that this is a Brilinta effect.  - I will arrange for ETT-Cardiolite.  - I will have him get an echocardiogram, check BNP.  - Continue ASA 81, Brilinta, pravastatin, lisinopril, metoprolol.  HYPERTENSION  BP is well-controlled on current regimen.  HYPERCHOLESTEROLEMIA Tolerating pravastatin without myalgias.  I will check lipids today.   Marca AnconaDalton Rushie Brazel 10/29/2014

## 2014-11-13 NOTE — CV Procedure (Signed)
    Cardiac Catheterization Procedure Note  Name: Joseph NissenDonald W Vanwart MRN: [id number] DOB: 03-Sep-1940  Procedure:Selective Coronary Angiography  Indication: Chest pain, abnormal Cardiolite.  Recent PCI in 1/16.    Procedural Details: The right wrist was prepped, draped, and anesthetized with 1% lidocaine. Using the modified Seldinger technique, a 6 French Slender sheath was introduced into the right radial artery. 3 mg of verapamil was administered through the sheath, weight-based unfractionated heparin was administered intravenously. Standard Judkins catheters were used for selective coronary angiography. Catheter exchanges were performed over an exchange length guidewire. There were no immediate procedural complications. A TR band was used for radial hemostasis at the completion of the procedure.  The patient was transferred to the post catheterization recovery area for further monitoring.  Procedural Findings: Hemodynamics: AO 113/53  Coronary angiography: Coronary dominance: right  Left mainstem: 30-40% distal LM stenosis.   Left anterior descending (LAD): The mid LAD stent is patent with luminal irregularities. There is mild to moderate rather diffuse distal LAD stenosis.  There is a moderate D1 with a long area of disease proximally reaching 80-90%.  The ostium of the diagonal does appear to be covered by the stent (similar to prior study).   Left circumflex (LCx): There is a small high OM1 that is totally occluded proximally (similar to prior study).  Very small OM2 looks ok. Small OM3 with 90% proximal stenosis (similar to prior study). Large PLOM with luminal irregularities.  The stents in the mid AV LCx were patent.   Right coronary artery (RCA): Luminal irregularities in RCA.  Small PLV with serial 50% stenoses.  PDA with 40-50% mid vessel stenosis and 30% distal vessel stenosis.   Left ventriculography: Not done, recent echo.  Final Conclusions:  Stents to LCx and LAD from 1/16  are all patent.  He has exertional chest pain and anterolateral ischemia on Cardiolite.  I suspect that the culprit vessel is the moderate diagonal with diffuse proximal disease (up to 80-90%).  I reviewed the films with Dr Clifton JamesMcAlhany.  This vessel is in a difficult position as the ostium appears to be covered by possibly 2 layers of stent.  We will plan initial medical management. Start ranolazine 500 mg bid.  Will need followup in 2-3 weeks.   Marca Anconaalton McLean MD, [facility] 11/13/2014, 11:26 AM

## 2014-11-13 NOTE — Progress Notes (Signed)
Patient Information: 1. Patients With Known Obstructive CAD (e.g., Prior MI, Prior PCI, Prior CABG, or Obstructive Disease on Invasive Angiography) 2. Post Revascularization (PCI or CABG) 3. Intermediate-risk noninvasive findings 4. Worsening or limiting symptoms A (7) Indication: 54; Score 7  Marca AnconaDalton Kiona Blume 11/13/2014 10:50 AM

## 2014-11-26 NOTE — Progress Notes (Signed)
Cardiology Office Note   Date:  11/27/2014   ID:  Joseph [name] 11-05-1940, MRN [id number]  PCP:  Dema Severin, NP  Cardiologist:  Dr. Marca Ancona     Chief Complaint  Patient presents with  . Coronary Artery Disease     History of Present Illness: Joseph [name] is a 74 y.o. male with a hx of CAD s/p anterior MI in 2004, s/p ant MI 2004 >> pLAD stent and staged RCA stent (Taxus), HTN, DM2, HL.  Admitted 07/2014 with a NSTEMI.  LHC demonstrated severe restenosis in the mid LAD which was treated with a DES and sequential severe lesions in the mid and distal CFX - both areas were treated with DES. RCA stent was patent and there was some disease in a small OM branch. EF remained preserved at 55%.    Last seen by Dr. Marca Ancona 10/28/14.  He c/o exertional chest pain and cardiac rehab and subsequent fatigue with exertion.  Nuclear study was obtained and it was intermediate risk with a small area of mild anterolateral ischemia.  LHC was arranged and demonstrated patent stents to the LCx and LAD.  There was 80-90% proximal D1 stenosis and this was felt to likely be the culprit for his angina.  This vessel is in a difficult position as the ostium appears to be covered by possibly 2 layers of stent.Med Rx was recommended.  Ranolazine was started.    He returns for FU.  He is feeling much better.  He had one episode of chest pain yesterday while walking.  It was very minor. He has a lot more energy.  Denies significant DOE.  He is NYHA 2-2b.  Denies orthopnea, PND, edema. No syncope.  He has occasional dizziness with standing quickly.     Studies/Reports Reviewed Today:  LHC 11/13/14 Left mainstem: 30-40% distal LM stenosis.  LAD: The mid LAD stent is patent with luminal irregularities. D1 with a long area of disease proximally reaching 80-90%. The ostium of the diagonal does appear to be covered by the stent (similar to prior study).  LCx: There is a small high OM1 that is  totally occluded proximally (similar to prior study). Very small OM2 looks ok. Small OM3 with 90% proximal stenosis (similar to prior study). The stents in the mid AV LCx were patent.  RCA: Luminal irregularities in RCA. Small PLV with serial 50% stenoses. PDA with 40-50% mid vessel stenosis and 30% distal vessel stenosis.  Final Conclusions: Stents to LCx and LAD from 1/16 are all patent. He has exertional chest pain and anterolateral ischemia on Cardiolite. I suspect that the culprit vessel is the moderate diagonal with diffuse proximal disease (up to 80-90%). I reviewed the films with Dr Clifton James. This vessel is in a difficult position as the ostium appears to be covered by possibly 2 layers of stent. We will plan initial medical management. Start ranolazine 500 mg bid.   Nuclear 11/04/14 Overall Impression: Small area of mild anterolateral ischemia (base, mid); small area of septal scar. Inferolateral defect(small) consistent with probable soft tissue attenuation.  Current scan is different from report of prior nuclear study. Unable to compare images. Overall intermediate risk scan LV Ejection Fraction: 61%. LV Wall Motion: NL LV Function; NL Wall Motion   Echo 10/31/14 - Modconcentric hypertrophy with septal predominance. EF 60% to 65%. Wall motion was normal; Grade 1diastolic dysfunction). - Aorta: Aortic root dimension: 41 mm (ED). - Ascending aorta: The ascending aorta was mildly dilated. -  Tricuspid valve: There was moderate regurgitation.  LHC (1/16):  mLAD stent with diff restenosis, prox/mid Dx with mod disease (jailed by stent), small OM1 99, mCFX 75, dCFX 80, mRCA stent ok, EF 55% >> PCI:  2.25 x 24 mm Synergy DES to the mLAD and 3.5 x 16 mm Synergy DES to dCFX and 4 x 16 mm Synergy DES to mCFX    Past Medical History: 1. 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 s/p back surgery.  2. Diabetes mellitus 3. Hypertension: had side  effects with chlorthalidone.  4. Hyperlipidemia: intolerant to multiple statins due to muscle pain. He is able to tolerate lovastatin.  5. CAD: Anterior MI in 2004. Patient had LAD and RCA PCI at the time. Last myoview was in 7/07: EF 62%, small area of anteroapical infarct. Echo (3/11): EF 60%, normal wall motion, mild MR, mild left atrial enlargement, mildly dilated ascending aorta. Myoview (1/13): Small fixed apical septal defect with no ischemia, EF 58%.  6. Arterial dopplers (3/11): no evidence for significant PAD. ABIs 10/12: Normal.  7. Allergic rhinitis 8. Abdominal discomfort: EGD 2011 showed no significant abnormalities. Possibly due to metformin.  9. C-spine arthritis 10. 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 Carotid dopplers in 1/13 showed minimal disease.  Past Medical History  Diagnosis Date  . 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  s/p back surgery  . DM (diabetes mellitus)   . HTN (hypertension)     had side effects with chlorthalidone  . HLD (hyperlipidemia)   . Allergic rhinitis   . CAD (coronary artery disease)     a. s/p MI in 2004:  stents to the LAD and staged stent RCA, EF 55%;  b.  NSTEMI (1/16):  LHC - mid LAD stent with diff dist restenosis, prox to mid Dx mod dsz jailed by stent, small OM1 99, mCFX 75, dCFX 80, mRCA stent ok, mPDA mild to mod dsz, EF 55% >> PCI:  Synergy DES to LAD; Synergy DES (x2) mid and dist CFX  . Hx of echocardiogram     Echo (3/11): EF 60%, normal wall motion, mild MR, mild left atrial enlargement, mildly dilated ascending aorta.  [name] Hx of cardiovascular stress test     Myoview (1/13): Small fixed apical septal defect with no ischemia, EF 58%.  [name] History of Doppler ultrasound     Arterial dopplers (3/11): no evidence for significant PAD. ABIs 10/12: Normal.    . History of CVA (cerebrovascular accident)      Small CVA seen by head CT (incidental) in 1/13.  Carotid dopplers in 1/13 showed minimal disease  . Abdominal discomfort     Abdominal discomfort: EGD 2011 showed no significant abnormalities.  Possibly due to metformin.    Past Surgical History  Procedure Laterality Date  . Coronary stent placement  2004    x2 LAD and RCA   . Prostate surgery    . Spinal fusion      L4-S1  . Left heart catheterization with coronary angiogram N/A 08/02/2014    Procedure: LEFT HEART CATHETERIZATION WITH CORONARY ANGIOGRAM;  Surgeon: Corky Crafts, MD;  Location: [facility] CATH LAB;  Service: Cardiovascular;  Laterality: N/A;  . Coronary angiogram  11/13/2014    Procedure: CORONARY ANGIOGRAM;  Surgeon: Laurey Morale, MD;  Location: [facility] CATH LAB;  Service: Cardiovascular;;  Current Outpatient Prescriptions  Medication Sig Dispense Refill  . acetaminophen (TYLENOL) 650 MG CR tablet Take 1,300 mg by mouth every 8 (eight) hours as needed for pain.    [name]. amLODipine (NORVASC) 10 MG tablet TAKE ONE TABLET BY MOUTH ONCE DAILY 90 tablet 1  . aspirin 81 MG tablet Take 81 mg by mouth 2 (two) times daily.     . Coenzyme Q10 (CO Q 10 PO) Take 1 tablet by mouth daily.     [name]. glipiZIDE (GLUCOTROL) 10 MG tablet Take 10 mg by mouth 2 (two) times daily before a meal.      . Krill Oil 300 MG CAPS Take 300 mg by mouth 2 (two) times daily.    [name]. LEVEMIR FLEXTOUCH 100 UNIT/ML Pen Inject 15 Units into the skin 2 (two) times daily.     [name]. lisinopril (PRINIVIL,ZESTRIL) 40 MG tablet Take 20 mg by mouth daily.     . metoprolol (LOPRESSOR) 25 MG tablet Take 0.5 tablets (12.5 mg total) by mouth 2 (two) times daily. 30 tablet 11  . nitroGLYCERIN (NITROSTAT) 0.4 MG SL tablet Place 0.4 mg under the tongue every 5 (five) minutes as needed. For chest pain    . pravastatin (PRAVACHOL) 40 MG tablet Take 1 tablet (40 mg total) by mouth every evening. 90 tablet 1  . RANEXA 500 MG 12 hr tablet Take 500 mg by mouth 2 (two) times daily.   0  .  ranitidine (ZANTAC) 150 MG tablet Take 150 mg by mouth 2 (two) times daily.      . ticagrelor (BRILINTA) 90 MG TABS tablet Take 1 tablet (90 mg total) by mouth 2 (two) times daily. 60 tablet 11   No current facility-administered medications for this visit.    Allergies:   Review of patient's allergies indicates no known allergies.    Social History:  The patient  reports that he has never smoked. He has never used smokeless tobacco. He reports that he does not drink alcohol or use illicit drugs.   Family History:  The patient's family history includes Heart attack in his father and mother. There is no history of Stroke.    ROS:  Please see the history of present illness.   Review of Systems  Constitution: Positive for malaise/fatigue.  Cardiovascular: Positive for dyspnea on exertion.  Hematologic/Lymphatic: Bruises/bleeds easily.  Neurological: Positive for dizziness.  All other systems reviewed and are negative.    PHYSICAL EXAM: VS:  BP 138/68 mmHg  Pulse 58  Ht 6\' 2"  (1.88 m)  Wt 206 lb (93.441 kg)  BMI 26.44 kg/m2    Wt Readings from Last 3 Encounters:  11/27/14 206 lb (93.441 kg)  10/31/14 201 lb (91.173 kg)  10/28/14 203 lb (92.08 kg)     GEN: Well nourished, well developed, in no acute distress HEENT: normal Neck: no JVD, no masses Cardiac:  Normal S1/S2, RRR; no murmur, no rubs or gallops, no edema; right wrist without hematoma or mass  Respiratory:  clear to auscultation bilaterally, no wheezing, rhonchi or rales. GI: soft, nontender, nondistended, + BS MS: no deformity or atrophy Skin: warm and dry maculopapular rash about R wrist Neuro:  CNs II-XII intact, Strength and sensation are intact Psych: Normal affect   EKG:  EKG is ordered today.  It demonstrates:   Sinus bradycardia, HR 58, normal axis, anteroseptal Q waves, no change from prior tracing  Recent Labs: 08/02/2014: TSH 1.633 08/03/2014: ALT 20 10/28/2014: Pro B Natriuretic peptide (BNP)  61.0  11/07/2014: BUN 26*; Creatinine 1.27; Hemoglobin 13.1; Platelets 244.0; Potassium 4.5; Sodium 138    Lipid Panel    Component Value Date/Time   CHOL 154 10/28/2014 1622   TRIG 264.0* 10/28/2014 1622   HDL 37.10* 10/28/2014 1622   CHOLHDL 4 10/28/2014 1622   VLDL 52.8* 10/28/2014 1622   LDLCALC 79 08/03/2014 0324   LDLDIRECT 83.0 10/28/2014 1622      ASSESSMENT AND PLAN:  1.  Coronary Artery Disease:  He is doing much better on his current regimen.  We discussed increasing his Ranexa to 1000 mg bid vs adding long acting nitrates.  He feels well enough that he does not want to adjust his medications any at this time.  He will let us know if changes his mind.    -  Continue Amlodipine, ASA, Brilinta, statin, ACE inhibitor, beta-blocker.  2.  Hypertension:  Controlled.  3.  Hyperlipidemia:  Continue statin.  Repeat Lipids due in 12/2014.   4.  Rash:  He developed a rash on his wrist after his cath in 07/2014 and this time.  Question allergy.  It is not clear to what he is allergic.  It is not from the IV dye as it is localized.     Current medicines are reviewed at length with the patient today.  Any concerns are as discussed above.  The following changes have been made:    None   Labs/ tests ordered today include:  No orders of the defined types were placed in this encounter.    Disposition:   FU Dr. Marca Anconaalton McLean 6 mos.    Signed, Brynda RimScott Ziair Penson, PA-C, MHS 11/27/2014 9:18 AM    [facility] [address], [place], [place]  [address] Phone: [phone]; Fax: [phone]

## 2014-11-27 ENCOUNTER — Encounter: Payer: Self-pay | Admitting: Physician Assistant

## 2014-11-27 ENCOUNTER — Ambulatory Visit (INDEPENDENT_AMBULATORY_CARE_PROVIDER_SITE_OTHER): Payer: Medicare Other | Admitting: Physician Assistant

## 2014-11-27 VITALS — BP 138/68 | HR 58 | Ht 74.0 in | Wt 206.0 lb

## 2014-11-27 DIAGNOSIS — E785 Hyperlipidemia, unspecified: Secondary | ICD-10-CM | POA: Diagnosis not present

## 2014-11-27 DIAGNOSIS — I2510 Atherosclerotic heart disease of native coronary artery without angina pectoris: Secondary | ICD-10-CM | POA: Diagnosis not present

## 2014-11-27 DIAGNOSIS — I10 Essential (primary) hypertension: Secondary | ICD-10-CM | POA: Diagnosis not present

## 2014-11-27 NOTE — Patient Instructions (Signed)
Medication Instructions:  Your physician recommends that you continue on your current medications as directed. Please refer to the Current Medication list given to you today.   Labwork: NONE  Testing/Procedures: NONE  Follow-Up: Your physician wants you to follow-up in: 6 MONTHS WITH DR. MCLEAN. You will receive a reminder letter in the mail two months in advance. If you don't receive a letter, please call our office to schedule the follow-up appointment.   Any Other Special Instructions Will Be Listed Below (If Applicable).   

## 2014-12-06 ENCOUNTER — Telehealth: Payer: Self-pay | Admitting: Cardiology

## 2014-12-06 NOTE — Telephone Encounter (Signed)
[place] called saying her father was having SOB. I called the pt and he confirmed he was SOB last night but today went to the gym and mowed his yard. Afterwards he took a shower he felt weak, dizzy. The pt thinks its secondary to Ranexa. I told him he could try stopping it but that wasn't a typical side effect. I encouraged him to call us back if his symptoms persist off Ranexa.   Corine ShelterLUKE Mariluz Crespo PA-C 12/06/2014 6:32 PM

## 2014-12-06 NOTE — Telephone Encounter (Signed)
Pt's daughter called, father having SOB she thinks its from Ranexa.  Joseph [name] Joseph Vilardi PA-C 12/06/2014 6:33 PM

## 2014-12-09 ENCOUNTER — Telehealth: Payer: Self-pay | Admitting: Cardiology

## 2014-12-09 MED ORDER — AMLODIPINE BESYLATE 5 MG PO TABS: 5.0000 mg | ORAL_TABLET | Freq: Every day | ORAL | Status: DC

## 2014-12-09 NOTE — Telephone Encounter (Signed)
New Message      Pt's daughter calling stating that pt is having spells of weakness, dizziness, and sob. Spoke to PA on call and decided to stop pt's Renexa last Friday. Episode of extreme weakness and pain in the back of his neck yesterday. Bp was 112/63 HR "fine" and Blood Sugar "fine". Pt's daughter would like to know what they should do. Please call back and advise.

## 2014-12-09 NOTE — Telephone Encounter (Signed)
Pt is aware he should not stop Brilinta per Dr Shirlee LatchMcLean.

## 2014-12-09 NOTE — Telephone Encounter (Signed)
Pt states the day after his appt 11/27/14 with Tereso NewcomerScott Weaver he started having dizzy and week spells. Pt talked with PA 12/06/14 and was advised to stop Ranexa. Pt states since stopping Ranexa he is feeling better but continues to have weakness.  Pt states he had episode of weakness yesterday --BP at that time 117/64 HR 63  Pt states since stopping Ranexa his breathing is better and he has not had any chest pain.

## 2014-12-09 NOTE — Telephone Encounter (Signed)
Pt states he stopped taking pravachol about 2 days ago because of symptoms.   Pt states his glucose was 236 last night, he took extra insulin, his glucose was 112 this AM. Pt is concerned that his weakness may be related to Brilinta,  is asking if he should change to plavix since he feels he tolerated plavix. Pt denies SOB since stopping Ranexa.  Pt advised I will forward to Dr Shirlee LatchMcLean for review.

## 2014-12-09 NOTE — Telephone Encounter (Signed)
Weakness not caused by Brilinta, do not stop.  Probably not related to pravastatin either, would restart.  May be running low BP at times, would have him try cutting amlodipine to 5 mg daily from 10 mg daily.  If he has more chest pain, would have him go back on ranolazine 500 mg bid as I doubt it was causing the symptoms either.  Surprising as it sounds like he was doing well when he saw Scott.

## 2014-12-09 NOTE — Telephone Encounter (Signed)
Pt advised, verbalized understanding, will decrease amlodipine to 5mg  daily, then restart Ranexa in a couple of days.   Pt states he will take and record his BP and call if symptoms do not improve.

## 2014-12-17 ENCOUNTER — Telehealth: Payer: Self-pay | Admitting: Cardiology

## 2014-12-17 MED ORDER — RANOLAZINE ER 500 MG PO TB12: 500.0000 mg | ORAL_TABLET | Freq: Two times a day (BID) | ORAL | Status: DC

## 2014-12-17 NOTE — Telephone Encounter (Signed)
New Message  Ranexa- continued taking and wanted to speak w/ RN about reaction. Also recorded BP from the last week:   5/24 6am- 152/84 p. 55, 7am 136/85 p. 61, 530pm 144/82 p. 63 5/25 10am 144/77 p. 60, 243 pm 141/66 p. 61, 645pm 137/71 p. 66 5/26 8am 155/79 p. 79, 8pm 146/76 p. 57 5/27 6am 146/76 p. 64 5/28 640am 145/76 p. 60, 440pm 139/78 p. 60,  5/29 12pm 143/76 p. 53 (from nap), 8pm 142/76 p. 56 5/30 3pm 139/75 p. 60 5/31 645am 139/88 p. 55

## 2014-12-17 NOTE — Telephone Encounter (Signed)
Pt states he restarted Ranexa a few days ago, is tolerating it and is requesting a refill.  Dr Shirlee LatchMcLean reviewed BP readings, did not recommend any changes at this time, pt advised.

## 2014-12-24 ENCOUNTER — Telehealth: Payer: Self-pay | Admitting: Cardiology

## 2014-12-24 DIAGNOSIS — E7800 Pure hypercholesterolemia, unspecified: Secondary | ICD-10-CM

## 2014-12-24 NOTE — Telephone Encounter (Signed)
New message     Patient calling     Pt C/O Shortness Of Breath: STAT if SOB developed within the last 24 hours or pt is noticeably SOB on the phone  1. Are you currently SOB (can you hear that pt is SOB on the phone)? No . Only at night   2. How long have you been experiencing SOB? Last 2 night   3. Are you SOB when sitting or when up moving around? Only when resting after taking cholesterol medication.    4. Are you currently experiencing any other symptoms? Unable to sleep, happen at night.  Weak , sick on stomach last night.

## 2014-12-24 NOTE — Telephone Encounter (Signed)
Patient phone is busy will try again later.

## 2014-12-25 MED ORDER — ROSUVASTATIN CALCIUM 5 MG PO TABS: 5.0000 mg | ORAL_TABLET | Freq: Every day | ORAL | Status: DC

## 2014-12-25 NOTE — Telephone Encounter (Signed)
Spoke with pt and made him aware of new orders for Crestor 5mg  QD and labs in 2 months. Pt verbalized understanding and was in agreement with this plan. Verified pharmacy and informed pt I would send over prescription.

## 2014-12-25 NOTE — Telephone Encounter (Signed)
Left message to call back  

## 2014-12-25 NOTE — Telephone Encounter (Signed)
Patient has been having SOB at night when laying down and weakness since starting the increased dose of Pravastatin. SOB was worse on Sunday and Monday night. This week patient started having facial redness and feeling like his face is on fire an hour after taking Pravastatin.  Patient was seen by PCP yesterday and had labs and chest xray. Requested results be sent to our office. Last night patient stated his SOB was somewhat better. Will forward to Dr. Shirlee LatchMcLean for recommendations.

## 2014-12-25 NOTE — Telephone Encounter (Signed)
Stop pravastatin, try Crestor 5 mg daily with lipids/LFTs in 2 months.

## 2014-12-26 ENCOUNTER — Telehealth: Payer: Self-pay | Admitting: Cardiology

## 2014-12-26 NOTE — Telephone Encounter (Signed)
Pt has been on Mevacor and pravachol in the past.  Will forward to Dr Shirlee Latch to review and give further orders.

## 2014-12-26 NOTE — Telephone Encounter (Signed)
New Prob    Pt is requesting an alternative or generic for Crestor due to cost. Please call.

## 2014-12-26 NOTE — Telephone Encounter (Signed)
He can try atorvastatin 10 mg daily.  Crestor will be generic soon but probably not inexpensive for another year.  Lipids/LFTs in 2 months.

## 2014-12-27 MED ORDER — ATORVASTATIN CALCIUM 10 MG PO TABS: 10.0000 mg | ORAL_TABLET | Freq: Every day | ORAL | Status: DC

## 2014-12-27 NOTE — Telephone Encounter (Signed)
Called patient this am. Patient agreed to changing to atorvastatin 10 mg by mouth daily, and order has been sent to pharmacy. Patient was informed about labs in two months. Labs are ordered and scheduled. Informed patient about Dr. Alford Highland note. Patient verbalized understanding.

## 2015-01-06 ENCOUNTER — Other Ambulatory Visit: Payer: Medicare Other

## 2015-02-25 ENCOUNTER — Other Ambulatory Visit (INDEPENDENT_AMBULATORY_CARE_PROVIDER_SITE_OTHER): Payer: Medicare Other | Admitting: *Deleted

## 2015-02-25 ENCOUNTER — Other Ambulatory Visit: Payer: Medicare Other

## 2015-02-25 DIAGNOSIS — E7800 Pure hypercholesterolemia, unspecified: Secondary | ICD-10-CM

## 2015-02-25 LAB — LIPID PANEL
Cholesterol: 125 mg/dL (ref 0–200)
HDL: 37.4 mg/dL — ABNORMAL LOW (ref >39.00)
LDL Cholesterol: 65 mg/dL (ref 0–99)
NONHDL: 87.22
Total CHOL/HDL Ratio: 3
Triglycerides: 111 mg/dL (ref 0.0–149.0)
VLDL: 22.2 mg/dL (ref 0.0–40.0)

## 2015-02-25 LAB — HEPATIC FUNCTION PANEL
ALBUMIN: 4.6 g/dL (ref 3.5–5.2)
ALK PHOS: 43 U/L (ref 39–117)
ALT: 14 U/L (ref 0–53)
AST: 15 U/L (ref 0–37)
Bilirubin, Direct: 0.2 mg/dL (ref 0.0–0.3)
Total Bilirubin: 0.8 mg/dL (ref 0.2–1.2)
Total Protein: 6.9 g/dL (ref 6.0–8.3)

## 2015-04-04 ENCOUNTER — Telehealth: Payer: Self-pay

## 2015-04-04 NOTE — Telephone Encounter (Signed)
Late Entry: [name] at [facility] contacted the office in regards to the pt having chest pain while in the office seeing NP.  Pt's EKG was faxed to our office for review.  Dr Johney Frame (DOD) called the office in regards to patient and the pt was mainly having CP with activity.  Dr Johney Frame advised that the pt's medications can be adjusted if needed. He advised that they could either increase lisinopril or metoprolol tartrate. The pt does not have a scheduled follow-up in our office at this time and we will have a scheduler contact the pt with an appointment.

## 2015-04-08 ENCOUNTER — Encounter: Payer: Self-pay | Admitting: Cardiology

## 2015-04-16 ENCOUNTER — Ambulatory Visit (INDEPENDENT_AMBULATORY_CARE_PROVIDER_SITE_OTHER): Payer: Medicare Other | Admitting: Cardiology

## 2015-04-16 ENCOUNTER — Encounter: Payer: Self-pay | Admitting: Cardiology

## 2015-04-16 VITALS — BP 130/70 | HR 58 | Ht 74.0 in | Wt 207.0 lb

## 2015-04-16 DIAGNOSIS — R0602 Shortness of breath: Secondary | ICD-10-CM | POA: Diagnosis not present

## 2015-04-16 DIAGNOSIS — I739 Peripheral vascular disease, unspecified: Secondary | ICD-10-CM | POA: Diagnosis not present

## 2015-04-16 DIAGNOSIS — I10 Essential (primary) hypertension: Secondary | ICD-10-CM | POA: Diagnosis not present

## 2015-04-16 DIAGNOSIS — I2510 Atherosclerotic heart disease of native coronary artery without angina pectoris: Secondary | ICD-10-CM | POA: Diagnosis not present

## 2015-04-16 NOTE — Patient Instructions (Signed)
Medication Instructions:  Your physician recommends that you continue on your current medications as directed. Please refer to the Current Medication list given to you today.  Labwork: NONE  Testing/Procedures: Your physician has requested that you have a lower extremity arterial doppler- During this test, ultrasound is used to evaluate arterial blood flow in the legs. Allow approximately one hour for this exam.    Follow-Up: Your physician wants you to follow-up in: 4 months with Dr. Shirlee Latch. You will receive a reminder letter in the mail two months in advance. If you don't receive a letter, please call our office to schedule the follow-up appointment.   Any Other Special Instructions Will Be Listed Below (If Applicable).

## 2015-04-17 ENCOUNTER — Other Ambulatory Visit: Payer: Self-pay | Admitting: Cardiology

## 2015-04-17 DIAGNOSIS — I739 Peripheral vascular disease, unspecified: Secondary | ICD-10-CM

## 2015-04-17 NOTE — Progress Notes (Signed)
Patient ID: Joseph [name], male   DOB: 05/12/1941, 74 y.o.   MRN: [id number] PCP: [name] ([place])  74 yo with history of CAD s/p anterior MI in 2004 and LAD/RCA PCIs as well as NSTEMI in 1/16 with mLAD and m/dLCx PCIs  presents for cardiology followup.   He had recurrent NSTEMI in 1/16.  LHC showed severe in-stent restenosis in the mLAD and severe disease in the mid to distal LCx.  He had PCI with DES to mLAD and DES to mid and distal LCx.  He had recurrent chest pain in 4/16 with Cardiolite showing anterolateral ischemia.  Repeat cath showed patent stents but jailed D1 with 80-90% ostial stenosis.  This was managed medically.   He did well until a couple of weeks ago.  He developed elbow pain from arthritis with swelling and was put on prednisone.  His blood sugar went way up. He developed left-sided chest pain that was relatively mild, with and without exertion.  He stopped prednisone and the chest pain resolved.  He now has his chronic pattern of chest pain.  It occurs about once a week and is relatively mild. There is no clear trigger . He walks 1.5 miles daily for exercise without dyspnea or chest pain.  He does notice pain in his hips and thighs when he walks up a hill.   Labs (3/11): LDL 90, HDL 40, K 4.4, creatinine 1.0 Labs (4/11): K 5.1, creatinine 1.4 Labs (9/11): K 4.5, creatinine 0.97, LDL 83, HDL 35 Labs (3/12): HDL 29, LDL 74, K 4.6, creatinine 1.1 Labs (9/12): LDL 60, HDL 36 Labs (1/13): Cardiac enzymes negative, K 5.3, creatinine 1.28 Labs (2/14): K 4.9, creatinine 1.4, LDL 61, HDL 29 Labs (1/16): LDL 79, HDL 25 Labs (8/16): LDL 65, HDL 37 Labs (9/16): K 5.1, creatinine 1.27, HCT 40.6  ECG: NSR, old ASMI  Past Medical History: 1. 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  s/p back surgery.  2. Diabetes mellitus 3. Hypertension: had side effects with chlorthalidone.  4. Hyperlipidemia: intolerant to multiple statins due to muscle pain.  He is  able to tolerate lovastatin and pravastatin.  5. CAD: Anterior MI in 2004.  Patient had LAD and RCA PCI at the time.  Last myoview was in 7/07: EF 62%, small area of anteroapical infarct.  Echo (3/11): EF 60%, normal wall motion, mild MR, mild left atrial enlargement, mildly dilated ascending aorta.   Myoview (1/13): Small fixed apical septal defect with no ischemia, EF 58%. He had recurrent NSTEMI in 1/16.  LHC showed severe in-stent restenosis in the mLAD and severe disease in the mid to distal LCx.  He had PCI with DES to mLAD and DES to mid and distal LCx. Recurrent CP so had Cardiolite in 4/16 with EF 61%, anterolateral ischemia.  LHC (4/16) with patent LAD and LCx stents; there was an 80-90% ostial D1 stenosis, this appeared to be jailed by LAD stent.  Medical treatment. Echo (4/16) with EF 60-65%, mildly dilated ascending aorta at 4.1 cm, moderate TR.  6.  Arterial dopplers (3/11): no evidence for significant PAD. ABIs 10/12: Normal.  7.  Allergic rhinitis 8.  Abdominal discomfort: EGD 2011 showed no significant abnormalities.  Possibly due to metformin.  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  Carotid dopplers in 1/13 showed minimal disease.   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  Tobacco Use - No.  Alcohol Use - no Drug Use - no  ROS: All systems reviewed and negative except as per HPI.   Current Outpatient Prescriptions  Medication Sig Dispense Refill  . acetaminophen (TYLENOL) 650 MG CR tablet Take 1,300 mg by mouth every 8 (eight) hours as needed for pain.    [name] amLODipine (NORVASC) 5 MG tablet Take 1 tablet (5 mg total) by mouth daily. 30 tablet 6  . aspirin 81 MG tablet Take 81 mg by mouth 2 (two) times daily.     [name] atorvastatin (LIPITOR) 10 MG tablet Take 1 tablet (10 mg total) by mouth daily. 90 tablet 3  . Coenzyme Q10 (CO Q 10 PO) Take 1  tablet by mouth daily.     . fluticasone (FLONASE) 50 MCG/ACT nasal spray Place 2 sprays into both nostrils daily.  0  . glipiZIDE (GLUCOTROL) 10 MG tablet Take 10 mg by mouth 2 (two) times daily before a meal.      . Krill Oil 300 MG CAPS Take 300 mg by mouth 2 (two) times daily.    [name] LEVEMIR FLEXTOUCH 100 UNIT/ML Pen Inject 15 Units into the skin 2 (two) times daily.     [name] lisinopril (PRINIVIL,ZESTRIL) 40 MG tablet Take 20 mg by mouth daily.     . metoprolol (LOPRESSOR) 25 MG tablet Take 0.5 tablets (12.5 mg total) by mouth 2 (two) times daily. 30 tablet 11  . nitroGLYCERIN (NITROSTAT) 0.4 MG SL tablet Place 0.4 mg under the tongue every 5 (five) minutes as needed. For chest pain    . ranitidine (ZANTAC) 150 MG tablet Take 150 mg by mouth 2 (two) times daily.      . ranolazine (RANEXA) 500 MG 12 hr tablet Take 1 tablet (500 mg total) by mouth 2 (two) times daily. 180 tablet 3  . ticagrelor (BRILINTA) 90 MG TABS tablet Take 1 tablet (90 mg total) by mouth 2 (two) times daily. 60 tablet 11   No current facility-administered medications for this visit.    BP 130/70 mmHg  Pulse 58  Ht  (1.88 m)  Wt 207 lb (93.895 kg)  BMI 26.57 kg/m2  SpO2 97% General: NAD Neck: No JVD, no thyromegaly or thyroid nodule.  Lungs: Clear to auscultation bilaterally with normal respiratory effort. CV: Nondisplaced PMI.  Heart regular S1/S2, no S3, no murmur.  +S4.  No peripheral edema.  No carotid bruit.  Unable to palpate pedal pulses.  Abdomen: Soft, nontender, no hepatosplenomegaly, no distention.  Neurologic: Alert and oriented x 3.  Psych: Normal affect. Extremities: No clubbing or cyanosis.   Assessment/Plan:  Bradycardia  HR stable on current low dose of metoprolol.  CAD  NSTEMI with DES x 3 in 1/16 to mLAD and m/dLCx.  Repeat cath in 4/16 after Cardiolite showed anterolateral ischemia.  This showed a D1 with 80-90% ostial stenosis, it was jailed by the stent.  This is the likely source of the  Cardiolite defect and angina.  I managed it medically and he has been doing well except for the episode a couple of weeks ago after starting prednisone.  He is back to baseline now.  Would not repeat cath at this point.  - Continue ASA 81, Brilinta, pravastatin, lisinopril, metoprolol.  - Ranolazine has worked well since 4/16, would continue.  - In 1/16 at 1 year post-NSTEMI, he can transition to either Brilinta 60 mg bid or off Brilinta and onto Plavix  75 mg daily.  HYPERTENSION  BP is well-controlled on current regimen.  HYPERCHOLESTEROLEMIA Tolerating atorvastatin without myalgias. Good lipids 8/16. Claudication Difficult to palpate pedal pulses and symptoms possibly consistent with claudication in the thighs. I will arrange for peripheral arterial doppler evaluation.   Marca Ancona 04/17/2015

## 2015-04-22 ENCOUNTER — Ambulatory Visit (HOSPITAL_COMMUNITY)
Admission: RE | Admit: 2015-04-22 | Discharge: 2015-04-22 | Disposition: A | Discharge status: Home / Self Care | Payer: Medicare Other | Source: Ambulatory Visit | Attending: Cardiology | Admitting: Cardiology

## 2015-04-22 DIAGNOSIS — I10 Essential (primary) hypertension: Secondary | ICD-10-CM | POA: Diagnosis not present

## 2015-04-22 DIAGNOSIS — I739 Peripheral vascular disease, unspecified: Secondary | ICD-10-CM

## 2015-04-22 DIAGNOSIS — E119 Type 2 diabetes mellitus without complications: Secondary | ICD-10-CM | POA: Diagnosis not present

## 2015-04-22 DIAGNOSIS — E785 Hyperlipidemia, unspecified: Secondary | ICD-10-CM | POA: Diagnosis not present

## 2015-05-07 ENCOUNTER — Telehealth: Payer: Self-pay | Admitting: Cardiology

## 2015-05-07 NOTE — Telephone Encounter (Signed)
New message   Patient looking for alternative due to higher insurance with bcbs.  In a doughnut hole now $ 400.00   STAT if patient is at the pharmacy , call can be transferred to refill team.   1. Which medications need to be refilled? ranexa 500 mg   2. Which pharmacy/location is medication to be sent to?  [facility] [place]   3. Do they need a 30 day or 90 day supply? 90 day supply

## 2015-05-09 ENCOUNTER — Telehealth: Payer: Self-pay

## 2015-05-09 MED ORDER — ISOSORBIDE MONONITRATE ER 60 MG PO TB24: 60.0000 mg | ORAL_TABLET | Freq: Every day | ORAL | Status: DC

## 2015-05-09 NOTE — Telephone Encounter (Signed)
Please see note below. 

## 2015-05-09 NOTE — Telephone Encounter (Signed)
Patient called in stating that [employer] will not cover Imdur.  He is asking if he can be changed back to Renexa.  He is also asking you to call [facility] [phone].  He states that your phone call can change the Renexa from a tier 4 medication to a tier 3 and he will be able to afford it. ($37.00 for the Rx)

## 2015-05-09 NOTE — Telephone Encounter (Signed)
Spoke with pt Patient looking for alternative due to higher insurance with bcbs. In a doughnut hole now $ 400.00 i offered him the form for pt assistance, but he declined, and we do not have samples.

## 2015-05-09 NOTE — Telephone Encounter (Signed)
Start Imdur 60 mg daily to replace ranolazine.

## 2015-05-09 NOTE — Telephone Encounter (Signed)
Ok, we can keep him on Ranexa 500 mg bid.  Please call the 1-800 number and see if we can get the tier changed.

## 2015-05-09 NOTE — Telephone Encounter (Signed)
Notified pt that Dr. Shirlee LatchMcLean suggested Imdur 60 mg daily to replace ranolazine.  He states he has enough medication (Ranexa) for about 2 mo so wants the Rx sent in but advise WM to put on hold till he calls.  [facility] [place]

## 2015-05-12 NOTE — Telephone Encounter (Signed)
I called [employer] and received no help at all. Ranexa is now a tier 3, next year it will be a tier 4 med.  They even said that Isosorbide or Imdur was not on their list of medications.- I then called the patient's pharmacy...they stated that the Ranexa reg.co-pay was $120 (not in the donut hole) for 90 day supply.  Isosorbide co-pay is $5.80 for 90 day supply, $10.00 cash for 90 day supply.

## 2015-05-13 ENCOUNTER — Telehealth: Payer: Self-pay

## 2015-05-13 NOTE — Telephone Encounter (Signed)
Called patient and told him the information on the Isosorbide and Ranexa.  He is going to stay with the Isosorbide if the prices are as the pharmacy stated.

## 2015-05-15 ENCOUNTER — Telehealth: Payer: Self-pay | Admitting: Cardiology

## 2015-05-15 NOTE — Telephone Encounter (Signed)
New message      Patient calling the office for samples of medication:   1.  What medication and dosage are you requesting samples for? brilinta 90mg   2.  Are you currently out of this medication? Have 2 pills left  3. Are you requesting samples to get you through until you receive your prescription? Pt in donut hole

## 2015-05-15 NOTE — Telephone Encounter (Signed)
Brilinta 90mg  6 bottles provided to patient. Per Dr. Alford HighlandMcLean's last OV note, in January 2017 he can either go to Brilinta 60mg  or Plavix 75 He will call to remind us.

## 2015-06-16 ENCOUNTER — Telehealth: Payer: Self-pay | Admitting: Cardiology

## 2015-06-16 MED ORDER — CLOPIDOGREL BISULFATE 75 MG PO TABS: 75.0000 mg | ORAL_TABLET | Freq: Every day | ORAL | Status: DC

## 2015-06-16 NOTE — Telephone Encounter (Signed)
Pt advised I will go ahead and send in a prescription for Plavix 75mg  daily to start in January 2017 in the place of Brilinta 90mg  bid.  Pt verbalized understanding.

## 2015-06-16 NOTE — Telephone Encounter (Signed)
Dr Joseph [name]'s 03/2015 note indicates pt can change to Plavix 75mg  daily from Brilinta 90mg  bid in January 2017. Pt advised he should continue Brilinta 90mg  bid until January 2017 then change to Plavix 75mg  daily, pt verbalized understanding.  Pt advised I will leave Bilinta 90mg  5 sample bottles of #8 each at front desk for him to pick up. Pt states he thinks that will get him through the end of the year.  Pt advised to let me know if he will be short.

## 2015-06-16 NOTE — Telephone Encounter (Signed)
New message      Pt states he was to call the office when he was almost out of brilinta because Dr Shirlee LatchMcLean was going to switch him to generic plavix.  Pt has 2 more brilinta pills.  Please call in a new presc for generic [facility]----90 day supply.

## 2015-06-18 ENCOUNTER — Telehealth: Payer: Self-pay | Admitting: Cardiology

## 2015-06-18 NOTE — Telephone Encounter (Signed)
Pt was switched from Renexa and Brilinta and wants to know what meds they were switched to-he forgot pls call  [phone]

## 2015-06-18 NOTE — Telephone Encounter (Signed)
Pt advised isosorbide (Imdur) 60mg  is in the place of Ranexa, Plavix 75mg  daily is in the place of Brilinta 90mg  bid starting January 2017.  Pt verbalized understanding.

## 2015-06-26 DIAGNOSIS — M79662 Pain in left lower leg: Secondary | ICD-10-CM | POA: Insufficient documentation

## 2015-06-26 DIAGNOSIS — R600 Localized edema: Secondary | ICD-10-CM | POA: Insufficient documentation

## 2015-07-02 ENCOUNTER — Other Ambulatory Visit: Payer: Self-pay | Admitting: *Deleted

## 2015-07-02 ENCOUNTER — Telehealth: Payer: Self-pay | Admitting: Cardiology

## 2015-07-02 ENCOUNTER — Telehealth: Payer: Self-pay | Admitting: *Deleted

## 2015-07-02 NOTE — Telephone Encounter (Signed)
Patient calling the office for samples of medication:   1.  What medication and dosage are you requesting samples for? Brulenta  2.  Are you currently out of this medication? Only has 5 days left

## 2015-07-02 NOTE — Telephone Encounter (Signed)
Patient was informed of samples placed at the front desk for pick up at his earliest convenience.  Patient stated he seen a commercial about receiving Brilinta for $18 a month and was going to try to get that number to see what all it in tells. I let the patient know I have not personally seen this commercial but will relate the message to the Dr and Nurse to let them know, if they have further information on this to let him know.

## 2015-07-02 NOTE — Telephone Encounter (Signed)
Will forward to [employer] Reiland, prior authorization.

## 2015-07-02 NOTE — Telephone Encounter (Signed)
Can we see if he can get assistance for Brilinta?

## 2015-07-03 NOTE — Telephone Encounter (Signed)
Patient has [facility], so it is doubtful he would get free assistance. Mailed him LISL paperwork, along with 2 other programs and phone no.to call for Brilinta.

## 2015-07-04 ENCOUNTER — Telehealth: Payer: Self-pay

## 2015-07-04 NOTE — Telephone Encounter (Signed)
Patient walked into clinic with complaints of a itchy rash on upper back bilaterally without pain.  Reddish diffuse rash upper back, itching without pustules or drainage for a month  Same type of rash last year at this time.  PCP had him place an antifungal medication on back that helped; tried this year without improvement  Has been on Brilanta for 11 months Added Advil 200 mg twice a day 4 weeks ago.  Discussed with Audrie LiaSally Earl, pharmacist. Instructed patient to stop advil, take tylenol for arthritis pain Call primary doctor for follow up appointment for rash.  Instructed to not take hot showers or restrictive clothing and to keep area dry.  Understands instructions and is appreciative

## 2015-07-16 ENCOUNTER — Telehealth: Payer: Self-pay | Admitting: Cardiology

## 2015-07-16 NOTE — Telephone Encounter (Signed)
Statin is important medication for preventing him from having a heart attack again.  It would be very unusual to have unilateral swelling in the lower leg from a statin.  This is likely something else.  Important to rule out DVT, which appears to have been done.  ?if this is from orthopedic injury.  Would be reasonable to get him in to see PA in our office in [facility] to check on him.

## 2015-07-16 NOTE — Telephone Encounter (Signed)
New Message    Pt states that he wants to be taken off the Cholesterol medication    He believes its the reason that his left leg is swelling

## 2015-07-16 NOTE — Telephone Encounter (Signed)
PER PT   SWELLING  NOTED  FOR  3-4  WEEKS   TO  CALF AREA   AND HURTS ALL THE TIME . WENT  TO  [facility]   1 WEEK  AGO   AND  RECEIVED INJECTION TO  KNEE  AND  ALSO HAD   ULTRASOUND TO  LEG  AND  NO DVT   NOTED .  CONTINUES  TO  HAVE  SWELLING  EVEN AFTER   LAYING  DOWN ALL NIGHT  AND   ARISING  IN AM.  SWELLING NOT TOTALLY GONE. INFORMED PT  NOT SURE THIS  IS COMING  FROM   CHOLESTEROL MED   BUT  [facility] FOR[facility] . PT IS   ALSO COMPLAINING  WITH  WEAKNESS SINCE  STARTING   ISOSORBIDE AND  ELEVATED  HEART RATE .Zack Seal/CY

## 2015-07-17 NOTE — Telephone Encounter (Signed)
PT  AWARE .  PER  PT  IS  AWAITING CALL FROM ORTHOPEDIC  OFFICE  RE   MRI  OF LEG  .WILL CALL OFFICE  LATER   IF  NO CONTACT FORM ORTHO  AND  HAVE  APPT  TOMORROW  WITH [facility]  [facility]  AS THERE APPEARS TO BE SOME OPENINGS .Joseph [name]/CY

## 2015-10-01 ENCOUNTER — Other Ambulatory Visit: Payer: Self-pay | Admitting: Cardiology

## 2015-10-01 MED ORDER — ISOSORBIDE MONONITRATE ER 60 MG PO TB24: 90.0000 mg | ORAL_TABLET | Freq: Every day | ORAL | Status: DC

## 2015-10-01 NOTE — Telephone Encounter (Signed)
Joseph [name] is calling because his Angina or something is bothering him . He has been having some chest pains ( Not Today). He does have Clopidogrel ( not sure if this is the correct medication for the Angina). Wants to know if he can take this if not , what can he take .    Thanks

## 2015-10-01 NOTE — Telephone Encounter (Signed)
Spoke with patient. 3 weeks ago he decided he wanted a patio and so he dug out the dirt and layed the pavers. All 9000 pounds. Since that time he has been having chest pain.  It is like his usual angina but coming more frequently now and with less exertion, sometimes at rest.  At worst it was 5-6 out of 10.  No real SOB. Also his HR speeds up with exertion, he has to stop what he is working on to get it to slow back down.  His BP yesterday was 127/77.  With his CAD history, reviewed with Dr. Eldridge DaceVaranasi, (DOD) who rec increase isosorbide to 90 mg daily and send a note to Dr. Shirlee LatchMcLean for further recommendation.  Pt is informed of this and will be awaiting a return call with Dr. Alford HighlandMcLean's recommendation.

## 2015-10-20 ENCOUNTER — Other Ambulatory Visit: Payer: Self-pay | Admitting: Cardiology

## 2015-11-26 DIAGNOSIS — S61419A Laceration without foreign body of unspecified hand, initial encounter: Secondary | ICD-10-CM | POA: Insufficient documentation

## 2015-12-05 DIAGNOSIS — R42 Dizziness and giddiness: Secondary | ICD-10-CM | POA: Insufficient documentation

## 2015-12-24 ENCOUNTER — Telehealth: Payer: Self-pay | Admitting: Cardiology

## 2015-12-24 NOTE — Telephone Encounter (Signed)
New message  Pt called complaining of fatigue and weakness. Numbness in his head. Request a call back to discuss this. ( no further details)

## 2015-12-24 NOTE — Telephone Encounter (Signed)
Spoke with pt who first reports he is thinking about having surgery and is requesting medical clearance for upcoming urological procedure.  Advised he will need to have surgeon fax a request for clearance with the type of surgery and specific questions r/t holding any medications.  He states understanding. He then reports he had been started on an antibiotic by his PCP about 4 weeks ago for a hand injury.  He was only able to take it for about 6 days.  Since then he has had weakness after about 30 minutes and a funny feeling of numbness in his head.  He reports feeling sleepy when he sits still for any length of time.  His HR and BP have been ok at his MD visits both at PCP and urologist.  He is denying any CP at this time but does report he will have some on occasion if he works too hard.  He is not taking the increase dose of Isosorbide 90 mg as instructed but is taking 60 mg a day.  I scheduled pt for the next available appt with Dr Shirlee LatchMcLean 04/05/16 as he is due for a 1 yr ROV then however I advised I will forward this information to Dr Shirlee LatchMcLean and his nurse for any further recommendation and to look at the possibility of moving appt up.  I offered to schedule him with a PA/NP however the patients wants to see Dr Shirlee LatchMcLean.

## 2015-12-24 NOTE — Telephone Encounter (Signed)
Symptoms don't sound cardiac but can have Joseph [name] move up his appointment.

## 2015-12-26 NOTE — Telephone Encounter (Signed)
Pt advised he has been scheduled to see Corine ShelterLuke Kilroy, [place] 12/30/15 8:30AM.

## 2015-12-26 NOTE — Telephone Encounter (Signed)
Follow up ° °Pt returned the call  °

## 2015-12-26 NOTE — Telephone Encounter (Signed)
LMTCB

## 2015-12-30 ENCOUNTER — Encounter: Payer: Self-pay | Admitting: Cardiology

## 2015-12-30 ENCOUNTER — Ambulatory Visit (INDEPENDENT_AMBULATORY_CARE_PROVIDER_SITE_OTHER): Payer: Medicare Other | Admitting: Cardiology

## 2015-12-30 VITALS — BP 138/74 | HR 57 | Ht 74.0 in | Wt 207.1 lb

## 2015-12-30 DIAGNOSIS — I208 Other forms of angina pectoris: Secondary | ICD-10-CM

## 2015-12-30 DIAGNOSIS — Z9861 Coronary angioplasty status: Secondary | ICD-10-CM

## 2015-12-30 DIAGNOSIS — E1159 Type 2 diabetes mellitus with other circulatory complications: Secondary | ICD-10-CM

## 2015-12-30 DIAGNOSIS — I10 Essential (primary) hypertension: Secondary | ICD-10-CM | POA: Diagnosis not present

## 2015-12-30 DIAGNOSIS — I2510 Atherosclerotic heart disease of native coronary artery without angina pectoris: Secondary | ICD-10-CM | POA: Diagnosis not present

## 2015-12-30 DIAGNOSIS — Z01810 Encounter for preprocedural cardiovascular examination: Secondary | ICD-10-CM | POA: Diagnosis not present

## 2015-12-30 DIAGNOSIS — Z01818 Encounter for other preprocedural examination: Secondary | ICD-10-CM | POA: Diagnosis not present

## 2015-12-30 NOTE — Assessment & Plan Note (Addendum)
Pt here [facility] pre op evaluation [facility]

## 2015-12-30 NOTE — Assessment & Plan Note (Signed)
Controlled.  

## 2015-12-30 NOTE — Patient Instructions (Signed)
Medication Instructions:   Your physician recommends that you continue on your current medications as directed. Please refer to the Current Medication list given to you today.   If you need a refill on your cardiac medications before your next appointment, please call your pharmacy.  Labwork: NONE ORDER TODAY    Testing/Procedures: NONE ORDER TODAY    Follow-Up: [facility]   Any Other Special Instructions Will Be Listed Below (If Applicable).

## 2015-12-30 NOTE — Assessment & Plan Note (Signed)
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

## 2015-12-30 NOTE — Assessment & Plan Note (Signed)
S/P MI PCI in 2004, s/p LAD and CFX PCI with DES Jan 2016. Pt had chest pain, abnormal Myoview April 2017- cath showed patent stents with jailed diagonal- medical Rx

## 2015-12-30 NOTE — Progress Notes (Signed)
12/30/2015 Joseph [name]   06/01/1941  [id number]  Primary Physician Dema Severin, NP Primary Cardiologist: Dr Shirlee Latch  HPI:  75 yo with history of CAD s/p anterior MI in 2004 and LAD/RCA PCIs. He then had a NSTEMI in 1/16. LHC showed severe in-stent restenosis in the mLAD and severe disease in the mid to distal LCx. He was treated with mLAD and m/dLCx PCI/DES. He had recurrent chest pain in 4/16 with Cardiolite showing anterolateral ischemia. Repeat cath showed patent stents but jailed D1 with 80-90% ostial stenosis. This was managed medically. Earlier this year he had exertional angina and his Imdur was increased to 90 mg daily with good results. He then cut this back on his own but had recurrent symptoms and he was increased back to 90 mg of Imdur daily, again with good result.    He is in the office today for pre op cardiac evaluation prior to proposed bladder surgery at [facility]. The pt says he had a procedure in the past for kidney stones and since has had urinary incontinence. He went to see his urologist at [facility] to discuss possible insertion of a prosthetic sphincter.    Current Outpatient Prescriptions  Medication Sig Dispense Refill  . acetaminophen (TYLENOL) 650 MG CR tablet Take 1,300 mg by mouth every 8 (eight) hours as needed for pain.    [name] amLODipine (NORVASC) 5 MG tablet Take 1 tablet (5 mg total) by mouth daily. 30 tablet 6  . aspirin 81 MG tablet Take 81 mg by mouth 2 (two) times daily.     [name] atorvastatin (LIPITOR) 10 MG tablet Take 1 tablet (10 mg total) by mouth daily. 90 tablet 3  . clopidogrel (PLAVIX) 75 MG tablet TAKE ONE TABLET BY MOUTH ONCE DAILY 90 tablet 0  . Coenzyme Q10 (CO Q 10 PO) Take 1 tablet by mouth daily.     . fluticasone (FLONASE) 50 MCG/ACT nasal spray Place 2 sprays into both nostrils daily.  0  . glipiZIDE (GLUCOTROL) 10 MG tablet Take 10 mg by mouth 2 (two) times daily before a meal.      . isosorbide mononitrate (IMDUR) 60 MG 24 hr tablet Take 1.5  tablets (90 mg total) by mouth daily. 90 tablet 3  . LEVEMIR FLEXTOUCH 100 UNIT/ML Pen Inject 15 Units into the skin 2 (two) times daily.     [name] lisinopril (PRINIVIL,ZESTRIL) 40 MG tablet Take 20 mg by mouth daily.     . metoprolol (LOPRESSOR) 25 MG tablet Take 0.5 tablets (12.5 mg total) by mouth 2 (two) times daily. 30 tablet 11  . nitroGLYCERIN (NITROSTAT) 0.4 MG SL tablet Place 0.4 mg under the tongue every 5 (five) minutes as needed for chest pain (x 3 doses). For chest pain    . Omega-3 Fatty Acids (FISH OIL) 1000 MG CAPS Take 1 capsule by mouth daily.    . ranitidine (ZANTAC) 150 MG tablet Take 150 mg by mouth 2 (two) times daily.       No current facility-administered medications for this visit.    No Known Allergies  Social History   Social History  . Marital Status: Single    Spouse Name: N/A  . Number of Children: 2  . Years of Education: N/A   Occupational History  . retired    Social History Main Topics  . Smoking status: Never Smoker   . Smokeless tobacco: Never Used  . Alcohol Use: No  . Drug Use: No  . Sexual Activity:  Not on file   Other Topics Concern  . Not on file   Social History Narrative     Review of Systems: General: negative for chills, fever, night sweats or weight changes.  Cardiovascular: negative for chest pain, dyspnea on exertion, edema, orthopnea, palpitations, paroxysmal nocturnal dyspnea or shortness of breath Dermatological: negative for rash Respiratory: negative for cough or wheezing Urologic: negative for hematuria Abdominal: negative for nausea, vomiting, diarrhea, bright red blood per rectum, melena, or hematemesis Neurologic: negative for visual changes, syncope, or dizziness Easy bruising on ASA and Plavix All other systems reviewed and are otherwise negative except as noted above.    Blood pressure 138/74, pulse 57, height 6\' 2"  (1.88 m), weight 207 lb 1.9 oz (93.949 kg).  General appearance: alert, cooperative and no  distress Neck: no carotid bruit and no JVD Lungs: clear to auscultation bilaterally Heart: regular rate and rhythm Abdomen: soft, non-tender; bowel sounds normal; no masses,  no organomegaly Extremities: no edema Skin: areas of bruising and ecchymosis on his arms and hands Neurologic: Grossly normal  EKG NSR, SB  ASSESSMENT AND PLAN:   Pre-operative cardiovascular examination Pt here for pre op evaluation [facility]  CAD S/P percutaneous coronary angioplasty S/P MI PCI in 2004, s/p LAD and CFX PCI with DES Jan 2016. Pt had chest pain, abnormal Myoview April 2017- cath showed patent stents with jailed diagonal- medical Rx    Essential hypertension Controlled  Exertional angina (HCC) Nitrates recently increased with improvement in his symptoms  Type 2 diabetes mellitus with vascular disease (HCC) Type 2 NIDDM, last SCr 1.27   PLAN  I discussed Mr Manukyan's case with Dr Elberta Fortisamnitz in the office today. Unfortunately we both feel the pt is a moderate to high risk for cardiac complications with surgery and anesthesia.  If he wanted to proceed despite this we would recommend not stopping his ASA or Plavix. He has a follow up with Dr Shirlee LatchMcLean in Sept.  Corine ShelterLuke Dwanda Tufano PA-C 12/30/2015 9:11 AM

## 2015-12-30 NOTE — Assessment & Plan Note (Signed)
Nitrates recently increased with improvement in his symptoms

## 2016-01-22 ENCOUNTER — Other Ambulatory Visit: Payer: Self-pay | Admitting: Cardiology

## 2016-03-18 ENCOUNTER — Encounter: Payer: Self-pay | Admitting: Cardiology

## 2016-04-05 ENCOUNTER — Encounter: Payer: Self-pay | Admitting: Cardiology

## 2016-04-05 ENCOUNTER — Ambulatory Visit (INDEPENDENT_AMBULATORY_CARE_PROVIDER_SITE_OTHER): Payer: Medicare Other | Admitting: Cardiology

## 2016-04-05 ENCOUNTER — Encounter (INDEPENDENT_AMBULATORY_CARE_PROVIDER_SITE_OTHER): Payer: Self-pay

## 2016-04-05 VITALS — BP 128/62 | HR 60 | Ht 74.0 in | Wt 212.0 lb

## 2016-04-05 DIAGNOSIS — I2510 Atherosclerotic heart disease of native coronary artery without angina pectoris: Secondary | ICD-10-CM | POA: Diagnosis not present

## 2016-04-05 DIAGNOSIS — I209 Angina pectoris, unspecified: Secondary | ICD-10-CM | POA: Diagnosis not present

## 2016-04-05 DIAGNOSIS — R079 Chest pain, unspecified: Secondary | ICD-10-CM | POA: Diagnosis not present

## 2016-04-05 DIAGNOSIS — I208 Other forms of angina pectoris: Secondary | ICD-10-CM | POA: Diagnosis not present

## 2016-04-05 DIAGNOSIS — E7800 Pure hypercholesterolemia, unspecified: Secondary | ICD-10-CM

## 2016-04-05 LAB — CBC WITH DIFFERENTIAL/PLATELET
Basophils Absolute: 0 cells/uL (ref 0–200)
Basophils Relative: 0 %
EOS ABS: 460 {cells}/uL (ref 15–500)
Eosinophils Relative: 5 %
HEMATOCRIT: 39.1 % (ref 38.5–50.0)
HEMOGLOBIN: 13.4 g/dL (ref 13.2–17.1)
LYMPHS ABS: 1840 {cells}/uL (ref 850–3900)
Lymphocytes Relative: 20 %
MCH: 31 pg (ref 27.0–33.0)
MCHC: 34.3 g/dL (ref 32.0–36.0)
MCV: 90.5 fL (ref 80.0–100.0)
MONO ABS: 828 {cells}/uL (ref 200–950)
MPV: 9.7 fL (ref 7.5–12.5)
Monocytes Relative: 9 %
NEUTROS ABS: 6072 {cells}/uL (ref 1500–7800)
Neutrophils Relative %: 66 %
Platelets: 230 10*3/uL (ref 140–400)
RBC: 4.32 MIL/uL (ref 4.20–5.80)
RDW: 12.9 % (ref 11.0–15.0)
WBC: 9.2 10*3/uL (ref 3.8–10.8)

## 2016-04-05 MED ORDER — ISOSORBIDE MONONITRATE ER 60 MG PO TB24: ORAL_TABLET | ORAL | 1 refills | Status: DC

## 2016-04-05 NOTE — Patient Instructions (Signed)
Medication Instructions:  Increase Imdur to 90mg  daily  This will be 1 and 1/2 of a 60mg  tablet daily  Labwork: Lipid profile/BMET/CBCd today  Testing/Procedures: Your physician has requested that you have en exercise stress myoview. For further information please visit [email or web address]. Please follow instruction sheet, as given.    Follow-Up: Your physician recommends that you schedule a follow-up appointment in: December 2017 with Dr Shirlee LatchMcLean      If you need a refill on your cardiac medications before your next appointment, please call your pharmacy.

## 2016-04-05 NOTE — Progress Notes (Signed)
Patient ID: Joseph [name], male   DOB: 03-Dec-1940, 75 y.o.   MRN: [id number] PCP: Joseph [name] ([place])  75 yo with history of CAD s/p anterior MI in 2004 and LAD/RCA PCIs as well as NSTEMI in 1/16 with mLAD and m/dLCx PCIs  presents for cardiology followup.   He had recurrent NSTEMI in 1/16.  LHC showed severe in-stent restenosis in the mLAD and severe disease in the mid to distal LCx.  He had PCI with DES to mLAD and DES to mid and distal LCx.  He had recurrent chest pain in 4/16 with Cardiolite showing anterolateral ischemia.  Repeat cath showed patent stents but jailed D1 with 80-90% ostial stenosis.  This was managed medically.   He has been stable recently.  He has chronic mild angina: chest discomfort with heavy exertion like climbing several times up a ladder.  This happens maybe once every couple of weeks.  No problems climbing a flight of stairs.  He can walk as far as he wants on flat ground.  This pattern has been stable for a long time.  He has not taken any NTG.  He does feel like he fatigues more easily than in the past.  Of note, he has incontinence, and it has been recommended that he have surgery for implantation of a prosthetic sphincter.    Labs (3/11): LDL 90, HDL 40, K 4.4, creatinine 1.0 Labs (4/11): K 5.1, creatinine 1.4 Labs (9/11): K 4.5, creatinine 0.97, LDL 83, HDL 35 Labs (3/12): HDL 29, LDL 74, K 4.6, creatinine 1.1 Labs (9/12): LDL 60, HDL 36 Labs (1/13): Cardiac enzymes negative, K 5.3, creatinine 1.28 Labs (2/14): K 4.9, creatinine 1.4, LDL 61, HDL 29 Labs (1/16): LDL 79, HDL 25 Labs (8/16): LDL 65, HDL 37 Labs (9/16): K 5.1, creatinine 1.27, HCT 40.6  ECG: NSR, 1st degree AVB, old ASMI  Past Medical History: 1. 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  s/p back surgery.  2. Diabetes mellitus 3. Hypertension: had side effects with chlorthalidone.  4. Hyperlipidemia: intolerant to multiple statins due to muscle pain.  He is able to  tolerate lovastatin and pravastatin.  5. CAD: Anterior MI in 2004.  Patient had LAD and RCA PCI at the time.  Last myoview was in 7/07: EF 62%, small area of anteroapical infarct.  Echo (3/11): EF 60%, normal wall motion, mild MR, mild left atrial enlargement, mildly dilated ascending aorta.   Myoview (1/13): Small fixed apical septal defect with no ischemia, EF 58%. He had recurrent NSTEMI in 1/16.  LHC showed severe in-stent restenosis in the mLAD and severe disease in the mid to distal LCx.  He had PCI with DES to mLAD and DES to mid and distal LCx. Recurrent CP so had Cardiolite in 4/16 with EF 61%, anterolateral ischemia.  LHC (4/16) with patent LAD and LCx stents; there was an 80-90% ostial D1 stenosis, this appeared to be jailed by LAD stent.  Medical treatment. Echo (4/16) with EF 60-65%, mildly dilated ascending aorta at 4.1 cm, moderate TR.  6.  Arterial dopplers (3/11): no evidence for significant PAD. ABIs 10/12: Normal.  7.  Allergic rhinitis 8.  Abdominal discomfort: EGD 2011 showed no significant abnormalities.  Possibly due to metformin.  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  Carotid dopplers in 1/13 showed minimal disease.  11. ABIs normal in 10/16.   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  Tobacco Use - No.  Alcohol Use - no Drug Use - no  ROS: All systems reviewed and negative except as per HPI.   Current Outpatient Prescriptions  Medication Sig Dispense Refill  . acetaminophen (TYLENOL) 650 MG CR tablet Take 1,300 mg by mouth every 8 (eight) hours as needed for pain.    [name] amLODipine (NORVASC) 5 MG tablet Take 1 tablet (5 mg total) by mouth daily. 30 tablet 6  . aspirin 81 MG tablet Take 81 mg by mouth 2 (two) times daily.     [name] atorvastatin (LIPITOR) 10 MG tablet Take 1 tablet (10 mg total) by mouth daily. 90 tablet 3  . clopidogrel  (PLAVIX) 75 MG tablet TAKE ONE TABLET BY MOUTH ONCE DAILY 90 tablet 3  . Coenzyme Q10 (CO Q 10 [name]) Take 1 tablet by mouth daily.     . fluticasone (FLONASE) 50 MCG/ACT nasal spray Place 2 sprays into both nostrils daily.  0  . glipiZIDE (GLUCOTROL) 10 MG tablet Take 10 mg by mouth 2 (two) times daily before a meal.      . LEVEMIR FLEXTOUCH 100 UNIT/ML Pen Inject 15 Units into the skin 2 (two) times daily.     [name] lisinopril (PRINIVIL,ZESTRIL) 40 MG tablet Take 20 mg by mouth daily.     . metoprolol (LOPRESSOR) 25 MG tablet Take 0.5 tablets (12.5 mg total) by mouth 2 (two) times daily. 30 tablet 11  . nitroGLYCERIN (NITROSTAT) 0.4 MG SL tablet Place 0.4 mg under the tongue every 5 (five) minutes as needed for chest pain (x 3 doses). For chest pain    . Omega-3 Fatty Acids (FISH OIL) 1000 MG CAPS Take 1 capsule by mouth daily.    . ranitidine (ZANTAC) 150 MG tablet Take 150 mg by mouth 2 (two) times daily.      . isosorbide mononitrate (IMDUR) 60 MG 24 hr tablet Take 1.5 tablets (90mg ) by mouth daily 135 tablet 1   No current facility-administered medications for this visit.     BP 128/62   Pulse 60   Ht 6\' 2"  (1.88 m)   Wt 212 lb (96.2 kg)   BMI 27.22 kg/m  General: NAD Neck: No JVD, no thyromegaly or thyroid nodule.  Lungs: Clear to auscultation bilaterally with normal respiratory effort. CV: Nondisplaced PMI.  Heart regular S1/S2, no S3, no murmur.  +S4.  No peripheral edema.  No carotid bruit.  Unable to palpate pedal pulses.  Abdomen: Soft, nontender, no hepatosplenomegaly, no distention.  Neurologic: Alert and oriented x 3.  Psych: Normal affect. Extremities: No clubbing or cyanosis.   Assessment/Plan:  Bradycardia  HR stable on current low dose of metoprolol.  Would not increase.   CAD  NSTEMI with DES x 3 in 1/16 to mLAD and m/dLCx.  Repeat cath was done in 4/16 after Cardiolite showed anterolateral ischemia.  Cath showed a D1 with 80-90% ostial stenosis, it was jailed by the  stent.  This is the likely source of the Cardiolite defect and his chronic stable angina. He has had no change in his mild anginal pattern.   - As he is considering bladder surgery, I will arrange for ETT-Cardiolite. If this is similar to the 4/16 study, no further workup is needed and he can go forward with surgery.   - Continue ASA 81, Plavix, atorvastatin, lisinopril, metoprolol.  - Imdur helps control his chronic angina, can increase to 90  mg daily for mild residual symptomatology.   HYPERTENSION  BP is well-controlled on current regimen.  HYPERCHOLESTEROLEMIA Tolerating atorvastatin without myalgias. Check lipids today.   Marca AnconaDalton Luc Shammas 04/05/2016

## 2016-04-06 ENCOUNTER — Telehealth (HOSPITAL_COMMUNITY): Payer: Self-pay | Admitting: *Deleted

## 2016-04-06 LAB — BASIC METABOLIC PANEL
BUN: 22 mg/dL (ref 7–25)
CALCIUM: 9.3 mg/dL (ref 8.6–10.3)
CO2: 26 mmol/L (ref 20–31)
CREATININE: 1.22 mg/dL — AB (ref 0.70–1.18)
Chloride: 103 mmol/L (ref 98–110)
GLUCOSE: 230 mg/dL — AB (ref 65–99)
Potassium: 4.7 mmol/L (ref 3.5–5.3)
Sodium: 138 mmol/L (ref 135–146)

## 2016-04-06 LAB — LIPID PANEL
CHOL/HDL RATIO: 4.7 ratio (ref <=5.0)
CHOLESTEROL: 117 mg/dL — AB (ref 125–200)
HDL: 25 mg/dL — ABNORMAL LOW (ref >=40)
LDL Cholesterol: 30 mg/dL (ref <130)
Triglycerides: 311 mg/dL — ABNORMAL HIGH (ref <150)
VLDL: 62 mg/dL — ABNORMAL HIGH (ref <30)

## 2016-04-06 NOTE — Telephone Encounter (Signed)
Left message on voicemail per DPR in reference to upcoming appointment scheduled on 04/08/16 with detailed instructions given per Myocardial Perfusion Study Information Sheet for the test. LM to arrive 15 minutes early, and that it is imperative to arrive on time for appointment to keep from having the test rescheduled. If you need to cancel or reschedule your appointment, please call the office within 24 hours of your appointment. Failure to do so may result in a cancellation of your appointment, and a $50 no show fee. Phone number given for call back for any questions.

## 2016-04-08 ENCOUNTER — Ambulatory Visit (HOSPITAL_COMMUNITY): Payer: Medicare Other | Attending: Internal Medicine

## 2016-04-08 DIAGNOSIS — R079 Chest pain, unspecified: Secondary | ICD-10-CM | POA: Diagnosis not present

## 2016-04-08 DIAGNOSIS — I10 Essential (primary) hypertension: Secondary | ICD-10-CM | POA: Diagnosis not present

## 2016-04-08 DIAGNOSIS — R0609 Other forms of dyspnea: Secondary | ICD-10-CM | POA: Insufficient documentation

## 2016-04-08 DIAGNOSIS — I2510 Atherosclerotic heart disease of native coronary artery without angina pectoris: Secondary | ICD-10-CM | POA: Insufficient documentation

## 2016-04-08 DIAGNOSIS — E119 Type 2 diabetes mellitus without complications: Secondary | ICD-10-CM | POA: Diagnosis not present

## 2016-04-08 DIAGNOSIS — R9439 Abnormal result of other cardiovascular function study: Secondary | ICD-10-CM | POA: Diagnosis not present

## 2016-04-08 LAB — MYOCARDIAL PERFUSION IMAGING
CHL CUP RESTING HR STRESS: 56 {beats}/min
CHL RATE OF PERCEIVED EXERTION: 18
CSEPED: 7 min
Estimated workload: 8.9 METS
Exercise duration (sec): 15 s
LV dias vol: 110 mL (ref 62–150)
LV sys vol: 49 mL
MPHR: 143 {beats}/min
NUC STRESS TID: 0.92
Peak HR: 134 {beats}/min
Percent HR: 93 %
RATE: 0.33
SDS: 2
SRS: 11
SSS: 13

## 2016-04-08 MED ORDER — TECHNETIUM TC 99M TETROFOSMIN IV KIT
11.0000 | PACK | Freq: Once | INTRAVENOUS | Status: AC | PRN
  Administered 2016-04-08: 11 via INTRAVENOUS
  Filled 2016-04-08: qty 11

## 2016-04-08 MED ORDER — TECHNETIUM TC 99M TETROFOSMIN IV KIT
31.8000 | PACK | Freq: Once | INTRAVENOUS | Status: AC | PRN
  Administered 2016-04-08: 31.8 via INTRAVENOUS
  Filled 2016-04-08: qty 32

## 2016-04-09 ENCOUNTER — Other Ambulatory Visit: Payer: Self-pay | Admitting: *Deleted

## 2016-04-09 DIAGNOSIS — E785 Hyperlipidemia, unspecified: Secondary | ICD-10-CM

## 2016-05-29 ENCOUNTER — Observation Stay (HOSPITAL_COMMUNITY)
Admission: AD | Admit: 2016-05-29 | Discharge: 2016-05-30 | Disposition: A | Discharge status: Home / Self Care | Payer: Medicare Other | Source: Other Acute Inpatient Hospital | Attending: Cardiology | Admitting: Cardiology

## 2016-05-29 DIAGNOSIS — E119 Type 2 diabetes mellitus without complications: Secondary | ICD-10-CM | POA: Insufficient documentation

## 2016-05-29 DIAGNOSIS — Z7984 Long term (current) use of oral hypoglycemic drugs: Secondary | ICD-10-CM | POA: Diagnosis not present

## 2016-05-29 DIAGNOSIS — Z794 Long term (current) use of insulin: Secondary | ICD-10-CM | POA: Insufficient documentation

## 2016-05-29 DIAGNOSIS — I200 Unstable angina: Secondary | ICD-10-CM | POA: Diagnosis not present

## 2016-05-29 DIAGNOSIS — I2510 Atherosclerotic heart disease of native coronary artery without angina pectoris: Secondary | ICD-10-CM | POA: Diagnosis not present

## 2016-05-29 DIAGNOSIS — R0789 Other chest pain: Secondary | ICD-10-CM | POA: Diagnosis present

## 2016-05-29 DIAGNOSIS — I4891 Unspecified atrial fibrillation: Principal | ICD-10-CM | POA: Insufficient documentation

## 2016-05-29 DIAGNOSIS — Z9861 Coronary angioplasty status: Secondary | ICD-10-CM

## 2016-05-29 DIAGNOSIS — I10 Essential (primary) hypertension: Secondary | ICD-10-CM | POA: Diagnosis not present

## 2016-05-29 DIAGNOSIS — Z79899 Other long term (current) drug therapy: Secondary | ICD-10-CM | POA: Diagnosis not present

## 2016-05-29 DIAGNOSIS — I480 Paroxysmal atrial fibrillation: Secondary | ICD-10-CM | POA: Diagnosis present

## 2016-05-29 DIAGNOSIS — Z955 Presence of coronary angioplasty implant and graft: Secondary | ICD-10-CM | POA: Insufficient documentation

## 2016-05-29 DIAGNOSIS — I25110 Atherosclerotic heart disease of native coronary artery with unstable angina pectoris: Secondary | ICD-10-CM | POA: Insufficient documentation

## 2016-05-29 DIAGNOSIS — E7800 Pure hypercholesterolemia, unspecified: Secondary | ICD-10-CM | POA: Diagnosis present

## 2016-05-29 DIAGNOSIS — Z7901 Long term (current) use of anticoagulants: Secondary | ICD-10-CM | POA: Diagnosis not present

## 2016-05-29 DIAGNOSIS — E1159 Type 2 diabetes mellitus with other circulatory complications: Secondary | ICD-10-CM | POA: Diagnosis present

## 2016-05-29 DIAGNOSIS — Z8673 Personal history of transient ischemic attack (TIA), and cerebral infarction without residual deficits: Secondary | ICD-10-CM | POA: Insufficient documentation

## 2016-05-29 HISTORY — DX: Paroxysmal atrial fibrillation: I48.0

## 2016-05-29 HISTORY — DX: Long term (current) use of anticoagulants: Z79.01

## 2016-05-29 LAB — GLUCOSE, CAPILLARY
Glucose-Capillary: 191 mg/dL — ABNORMAL HIGH (ref 65–99)
Glucose-Capillary: 230 mg/dL — ABNORMAL HIGH (ref 65–99)

## 2016-05-29 LAB — COMPREHENSIVE METABOLIC PANEL
ALBUMIN: 3.8 g/dL (ref 3.5–5.0)
ALK PHOS: 46 U/L (ref 38–126)
ALT: 16 U/L — AB (ref 17–63)
ANION GAP: 6 (ref 5–15)
AST: 16 U/L (ref 15–41)
BUN: 20 mg/dL (ref 6–20)
CALCIUM: 9.5 mg/dL (ref 8.9–10.3)
CHLORIDE: 106 mmol/L (ref 101–111)
CO2: 26 mmol/L (ref 22–32)
CREATININE: 1.21 mg/dL (ref 0.61–1.24)
GFR calc Af Amer: >60 mL/min (ref >60)
GFR calc non Af Amer: 56 mL/min — ABNORMAL LOW (ref >60)
GLUCOSE: 264 mg/dL — AB (ref 65–99)
Potassium: 4.5 mmol/L (ref 3.5–5.1)
SODIUM: 138 mmol/L (ref 135–145)
Total Bilirubin: 1 mg/dL (ref 0.3–1.2)
Total Protein: 5.9 g/dL — ABNORMAL LOW (ref 6.5–8.1)

## 2016-05-29 LAB — CBC WITH DIFFERENTIAL/PLATELET
BASOS ABS: 0.1 10*3/uL (ref 0.0–0.1)
BASOS PCT: 1 %
EOS PCT: 3 %
Eosinophils Absolute: 0.2 10*3/uL (ref 0.0–0.7)
HCT: 38.9 % — ABNORMAL LOW (ref 39.0–52.0)
Hemoglobin: 13.6 g/dL (ref 13.0–17.0)
LYMPHS PCT: 26 %
Lymphs Abs: 2.1 10*3/uL (ref 0.7–4.0)
MCH: 30.8 pg (ref 26.0–34.0)
MCHC: 35 g/dL (ref 30.0–36.0)
MCV: 88.2 fL (ref 78.0–100.0)
MONO ABS: 0.4 10*3/uL (ref 0.1–1.0)
Monocytes Relative: 5 %
Neutro Abs: 5.2 10*3/uL (ref 1.7–7.7)
Neutrophils Relative %: 65 %
PLATELETS: 219 10*3/uL (ref 150–400)
RBC: 4.41 MIL/uL (ref 4.22–5.81)
RDW: 12.5 % (ref 11.5–15.5)
WBC: 8 10*3/uL (ref 4.0–10.5)

## 2016-05-29 LAB — TROPONIN I
TROPONIN I: 0.05 ng/mL — AB (ref <0.03)
Troponin I: 0.04 ng/mL (ref <0.03)

## 2016-05-29 LAB — PROTIME-INR
INR: 1.05
PROTHROMBIN TIME: 13.8 s (ref 11.4–15.2)

## 2016-05-29 LAB — BRAIN NATRIURETIC PEPTIDE: B NATRIURETIC PEPTIDE 5: 127.1 pg/mL — AB (ref 0.0–100.0)

## 2016-05-29 LAB — MAGNESIUM: Magnesium: 2 mg/dL (ref 1.7–2.4)

## 2016-05-29 LAB — HEPARIN LEVEL (UNFRACTIONATED): Heparin Unfractionated: 0.64 IU/mL (ref 0.30–0.70)

## 2016-05-29 LAB — TSH: TSH: 1.483 u[IU]/mL (ref 0.350–4.500)

## 2016-05-29 LAB — T4, FREE: FREE T4: 0.89 ng/dL (ref 0.61–1.12)

## 2016-05-29 MED ORDER — HEPARIN BOLUS VIA INFUSION
4000.0000 [IU] | Freq: Once | INTRAVENOUS | Status: AC
  Administered 2016-05-29: 4000 [IU] via INTRAVENOUS
  Filled 2016-05-29: qty 4000

## 2016-05-29 MED ORDER — HEPARIN (PORCINE) IN NACL 100-0.45 UNIT/ML-% IJ SOLN
1550.0000 [IU]/h | INTRAMUSCULAR | Status: AC
  Administered 2016-05-29 – 2016-05-30 (×2): 1550 [IU]/h via INTRAVENOUS
  Filled 2016-05-29 (×2): qty 250

## 2016-05-29 MED ORDER — CLOPIDOGREL BISULFATE 75 MG PO TABS
75.0000 mg | ORAL_TABLET | Freq: Every day | ORAL | Status: DC
  Administered 2016-05-30: 75 mg via ORAL
  Filled 2016-05-29: qty 1

## 2016-05-29 MED ORDER — NITROGLYCERIN 0.4 MG SL SUBL: 0.4000 mg | SUBLINGUAL_TABLET | SUBLINGUAL | Status: DC | PRN

## 2016-05-29 MED ORDER — AMLODIPINE BESYLATE 5 MG PO TABS
5.0000 mg | ORAL_TABLET | Freq: Every day | ORAL | Status: DC
  Administered 2016-05-30: 5 mg via ORAL
  Filled 2016-05-29: qty 1

## 2016-05-29 MED ORDER — FLUTICASONE PROPIONATE 50 MCG/ACT NA SUSP
2.0000 | Freq: Every day | NASAL | Status: DC
  Administered 2016-05-30: 2 via NASAL
  Filled 2016-05-29: qty 16

## 2016-05-29 MED ORDER — ONDANSETRON HCL 4 MG/2ML IJ SOLN: 4.0000 mg | Freq: Four times a day (QID) | INTRAMUSCULAR | Status: DC | PRN

## 2016-05-29 MED ORDER — ACETAMINOPHEN 325 MG PO TABS: 650.0000 mg | ORAL_TABLET | ORAL | Status: DC | PRN

## 2016-05-29 MED ORDER — GLIPIZIDE 10 MG PO TABS
10.0000 mg | ORAL_TABLET | Freq: Two times a day (BID) | ORAL | Status: DC
  Administered 2016-05-29 – 2016-05-30 (×2): 10 mg via ORAL
  Filled 2016-05-29 (×2): qty 1

## 2016-05-29 MED ORDER — OMEGA-3-ACID ETHYL ESTERS 1 G PO CAPS
1.0000 g | ORAL_CAPSULE | Freq: Two times a day (BID) | ORAL | Status: DC
  Administered 2016-05-29 – 2016-05-30 (×2): 1 g via ORAL
  Filled 2016-05-29 (×2): qty 1

## 2016-05-29 MED ORDER — LISINOPRIL 20 MG PO TABS
20.0000 mg | ORAL_TABLET | Freq: Every day | ORAL | Status: DC
  Administered 2016-05-30: 20 mg via ORAL
  Filled 2016-05-29: qty 1

## 2016-05-29 MED ORDER — FAMOTIDINE 20 MG PO TABS
20.0000 mg | ORAL_TABLET | Freq: Two times a day (BID) | ORAL | Status: DC
  Administered 2016-05-29 – 2016-05-30 (×2): 20 mg via ORAL
  Filled 2016-05-29 (×2): qty 1

## 2016-05-29 MED ORDER — INSULIN DETEMIR 100 UNIT/ML ~~LOC~~ SOLN
15.0000 [IU] | Freq: Two times a day (BID) | SUBCUTANEOUS | Status: DC
  Administered 2016-05-29 – 2016-05-30 (×2): 15 [IU] via SUBCUTANEOUS
  Filled 2016-05-29 (×4): qty 0.15

## 2016-05-29 MED ORDER — ATORVASTATIN CALCIUM 10 MG PO TABS
10.0000 mg | ORAL_TABLET | Freq: Every day | ORAL | Status: DC
  Administered 2016-05-29: 10 mg via ORAL
  Filled 2016-05-29: qty 1

## 2016-05-29 MED ORDER — ISOSORBIDE MONONITRATE ER 30 MG PO TB24
90.0000 mg | ORAL_TABLET | Freq: Every day | ORAL | Status: DC
  Administered 2016-05-30: 90 mg via ORAL
  Filled 2016-05-29: qty 1

## 2016-05-29 MED ORDER — METOPROLOL TARTRATE 12.5 MG HALF TABLET
12.5000 mg | ORAL_TABLET | Freq: Two times a day (BID) | ORAL | Status: DC
  Administered 2016-05-29 – 2016-05-30 (×2): 12.5 mg via ORAL
  Filled 2016-05-29 (×2): qty 1

## 2016-05-29 NOTE — Progress Notes (Signed)
ANTICOAGULATION CONSULT NOTE - Follow Up Consult  Pharmacy Consult for Heparin  Indication: chest pain/ACS and atrial fibrillation  No Known Allergies  Patient Measurements: Weight: 214 lb 12.8 oz (97.4 kg)  Vital Signs: Temp: 98.7 F (37.1 C) (11/11 2206) Temp Source: Oral (11/11 2206) BP: 132/74 (11/11 2206) Pulse Rate: 73 (11/11 2206)  Labs:  Recent Labs  05/29/16 1508 05/29/16 1903 05/29/16 2249  HGB 13.6  --   --   HCT 38.9*  --   --   PLT 219  --   --   LABPROT 13.8  --   --   INR 1.05  --   --   HEPARINUNFRC  --   --  0.64  CREATININE 1.21  --   --   TROPONINI 0.04* 0.05*  --     Estimated Creatinine Clearance: 59.4 mL/min (by C-G formula based on SCr of 1.21 mg/dL).   Assessment: Heparin for new onset afib, also having some chest pain, initial heparin level is therapeutic, plans to switch to oral anti-coagulation after chest pain work-up  Goal of Therapy:  Heparin level 0.3-0.7 units/ml Monitor platelets by anticoagulation protocol: Yes   Plan:  -Cont heparin at 1550 units/hr -Confirmatory HL with AM labs  Abran DukeLedford, Shenna Brissette 05/29/2016,11:57 PM

## 2016-05-29 NOTE — Progress Notes (Signed)
Pt troponin level at 0.04. Ward Givenshris Berge NP text paged for update on value./

## 2016-05-29 NOTE — Progress Notes (Signed)
Patient attemted to stand at side of bed to urinate and his HR jumped into the 140's but quickly returned to the 100's and shortly after converted yo NSR in the 70-80's, will get an EKG to confirm and text paged the Cardiology fellow to make him aware, no call back or further orders at this time, will continue to monitor.

## 2016-05-29 NOTE — Progress Notes (Signed)
ANTICOAGULATION CONSULT NOTE - Initial Consult  Pharmacy Consult for heparin Indication: chest pain/ACS and atrial fibrillation  No Known Allergies  Patient Measurements: Weight: 214 lb 12.8 oz (97.4 kg) Heparin Dosing Weight: n/a  Vital Signs: Temp: 97.7 F (36.5 C) (11/11 1100) Temp Source: Oral (11/11 1100) BP: 123/72 (11/11 1100) Pulse Rate: 82 (11/11 1100)  Labs: No results for input(s): HGB, HCT, PLT, APTT, LABPROT, INR, HEPARINUNFRC, HEPRLOWMOCWT, CREATININE, CKTOTAL, CKMB, TROPONINI in the last 72 hours.  CrCl cannot be calculated (Patient's most recent lab result is older than the maximum 21 days allowed.).   Medical History: Past Medical History:  Diagnosis Date  . Abdominal discomfort    Abdominal discomfort: EGD 2011 showed no significant abnormalities.  Possibly due to metformin.  . Allergic rhinitis   . CAD (coronary artery disease)    a. s/p MI in 2004:  stents to the LAD and staged stent RCA, EF 55%;  b.  NSTEMI (1/16):  LHC - mid LAD stent with diff dist restenosis, prox to mid Dx mod dsz jailed by stent, small OM1 99, mCFX 75, dCFX 80, mRCA stent ok, mPDA mild to mod dsz, EF 55% >> PCI:  Synergy DES to LAD; Synergy DES (x2) mid and dist CFX  . 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  s/p back surgery  . DM (diabetes mellitus) (HCC)   . History of CVA (cerebrovascular accident)    Small CVA seen by head CT (incidental) in 1/13.  Carotid dopplers in 1/13 showed minimal disease  . History of Doppler ultrasound    Arterial dopplers (3/11): no evidence for significant PAD. ABIs 10/12: Normal.    . HLD (hyperlipidemia)   . HTN (hypertension)    had side effects with chlorthalidone  . Hx of cardiovascular stress test    Myoview (1/13): Small fixed apical septal defect with no ischemia, EF 58%.  [name]. Hx of echocardiogram    Echo (3/11): EF 60%, normal wall motion, mild MR, mild left atrial enlargement,  mildly dilated ascending aorta.  [name]. Spondylolisthesis    L4-S1    Assessment: -TBW 97.4 kg while be used for dosing (IBW 82.2 so not quite at 125% of IBW) -No labs at time of consult, will need to follow  Goal of Therapy:  Heparin level 0.3-0.7 units/ml Monitor platelets by anticoagulation protocol: Yes   Plan:  -Bolus 4000 units x1 -Start infusion at 1550 units/hr -Daily CBC, HL -8 hour HL at 2230  Gwyndolyn KaufmanKai Braylen Staller Bernette Redbird(Kenny), PharmD  PGY1 Pharmacy Resident Pager: [phone] 05/29/2016 2:19 PM

## 2016-05-29 NOTE — H&P (Addendum)
Patient ID: Joseph [name] MRN: [id number], DOB/AGE: July 18, 1941   Admit date: 05/29/2016   Primary Physician: Dema Severin, NP Primary Cardiologist: Dr Shirlee Latch  Pt. Profile:  New onset a-fib, chest pain  Problem List  Past Medical History:  Diagnosis Date  . Abdominal discomfort    Abdominal discomfort: EGD 2011 showed no significant abnormalities.  Possibly due to metformin.  . Allergic rhinitis   . CAD (coronary artery disease)    a. s/p MI in 2004:  stents to the LAD and staged stent RCA, EF 55%;  b.  NSTEMI (1/16):  LHC - mid LAD stent with diff dist restenosis, prox to mid Dx mod dsz jailed by stent, small OM1 99, mCFX 75, dCFX 80, mRCA stent ok, mPDA mild to mod dsz, EF 55% >> PCI:  Synergy DES to LAD; Synergy DES (x2) mid and dist CFX  . 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  s/p back surgery  . DM (diabetes mellitus) (HCC)   . History of CVA (cerebrovascular accident)    Small CVA seen by head CT (incidental) in 1/13.  Carotid dopplers in 1/13 showed minimal disease  . History of Doppler ultrasound    Arterial dopplers (3/11): no evidence for significant PAD. ABIs 10/12: Normal.    . HLD (hyperlipidemia)   . HTN (hypertension)    had side effects with chlorthalidone  . Hx of cardiovascular stress test    Myoview (1/13): Small fixed apical septal defect with no ischemia, EF 58%.  [name] Hx of echocardiogram    Echo (3/11): EF 60%, normal wall motion, mild MR, mild left atrial enlargement, mildly dilated ascending aorta.  [name] Spondylolisthesis    L4-S1    Past Surgical History:  Procedure Laterality Date  . CORONARY ANGIOGRAM  11/13/2014   Procedure: CORONARY ANGIOGRAM;  Surgeon: Laurey Morale, MD;  Location: [facility] CATH LAB;  Service: Cardiovascular;;  . CORONARY STENT PLACEMENT  2004   x2 LAD and RCA   . LEFT HEART CATHETERIZATION WITH CORONARY ANGIOGRAM N/A 08/02/2014   Procedure: LEFT HEART  CATHETERIZATION WITH CORONARY ANGIOGRAM;  Surgeon: Corky Crafts, MD;  Location: [facility] CATH LAB;  Service: Cardiovascular;  Laterality: N/A;  . PROSTATE SURGERY    . SPINAL FUSION     L4-S1     Allergies  No Known Allergies  HPI  75 yo with history of CAD s/p anterior MI in 2004 and LAD/RCA PCIs as well as NSTEMI in 1/16 with mLAD and m/dLCx PCIs  presents for cardiology followup.   He had recurrent NSTEMI in 1/16.  LHC showed severe in-stent restenosis in the mLAD and severe disease in the mid to distal LCx.  He had PCI with DES to mLAD and DES to mid and distal LCx.  He had recurrent chest pain in 4/16 with Cardiolite showing anterolateral ischemia.  Repeat cath showed patent stents but jailed D1 with 80-90% ostial stenosis.  This was managed medically. He is followed in clinic by Dr Shirlee Latch, the last seen on 04/05/2016, when he was doing well. He has been stable recently.  He has chronic mild angina: chest discomfort with heavy exertion like climbing several times up a ladder.  This happens maybe once every couple of weeks.    He presented to the [facility] today after he developed burning chest while working in his back yard, he felt palpitations at the time, the left sided chest pain persisted till  this morning, when he presented to the ER he was found to be in atrial fibrillation. His HR was 80-90 what is unusual for him as his baseline is in 50'.   Labs (3/11): LDL 90, HDL 40, K 4.4, creatinine 1.0 Labs (4/11): K 5.1, creatinine 1.4 Labs (9/11): K 4.5, creatinine 0.97, LDL 83, HDL 35 Labs (3/12): HDL 29, LDL 74, K 4.6, creatinine 1.1 Labs (9/12): LDL 60, HDL 36 Labs (1/13): Cardiac enzymes negative, K 5.3, creatinine 1.28 Labs (2/14): K 4.9, creatinine 1.4, LDL 61, HDL 29 Labs (1/16): LDL 79, HDL 25 Labs (8/16): LDL 65, HDL 37 Labs (9/16): K 5.1, creatinine 1.27, HCT 40.6  ECG: NSR, 1st degree AVB, old ASMI  Past Medical History: 1. L4 to S1 degenerative disease and  spondylolisthesis: low back pain.  s/p back surgery.  2. Diabetes mellitus 3. Hypertension: had side effects with chlorthalidone.  4. Hyperlipidemia: intolerant to multiple statins due to muscle pain.  He is able to tolerate lovastatin and pravastatin.  5. CAD: Anterior MI in 2004.  Patient had LAD and RCA PCI at the time.  Last myoview was in 7/07: EF 62%, small area of anteroapical infarct.  Echo (3/11): EF 60%, normal wall motion, mild MR, mild left atrial enlargement, mildly dilated ascending aorta.   Myoview (1/13): Small fixed apical septal defect with no ischemia, EF 58%. He had recurrent NSTEMI in 1/16.  LHC showed severe in-stent restenosis in the mLAD and severe disease in the mid to distal LCx.  He had PCI with DES to mLAD and DES to mid and distal LCx. Recurrent CP so had Cardiolite in 4/16 with EF 61%, anterolateral ischemia.  LHC (4/16) with patent LAD and LCx stents; there was an 80-90% ostial D1 stenosis, this appeared to be jailed by LAD stent.  Medical treatment. Echo (4/16) with EF 60-65%, mildly dilated ascending aorta at 4.1 cm, moderate TR.  6.  Arterial dopplers (3/11): no evidence for significant PAD. ABIs 10/12: Normal.  7.  Allergic rhinitis 8.  Abdominal discomfort: EGD 2011 showed no significant abnormalities.  Possibly due to metformin.  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  Carotid dopplers in 1/13 showed minimal disease.  11. ABIs normal in 10/16.  Home Medications  Prior to Admission medications   Medication Sig Start Date End Date Taking? Authorizing Provider  acetaminophen (TYLENOL) 650 MG CR tablet Take 1,300 mg by mouth every 8 (eight) hours as needed for pain.    Historical Provider, MD  amLODipine (NORVASC) 5 MG tablet Take 1 tablet (5 mg total) by mouth daily. 12/09/14   Laurey Morale, MD  aspirin 81 MG tablet Take 81 mg by mouth 2 (two) times daily.     Historical Provider, MD  atorvastatin (LIPITOR) 10 MG tablet  Take 1 tablet (10 mg total) by mouth daily. 12/27/14   Laurey Morale, MD  clopidogrel (PLAVIX) 75 MG tablet TAKE ONE TABLET BY MOUTH ONCE DAILY 01/22/16   Laurey Morale, MD  Coenzyme Q10 (CO Q 10 PO) Take 1 tablet by mouth daily.     Historical Provider, MD  fluticasone (FLONASE) 50 MCG/ACT nasal spray Place 2 sprays into both nostrils daily. 03/31/15   Historical Provider, MD  glipiZIDE (GLUCOTROL) 10 MG tablet Take 10 mg by mouth 2 (two) times daily before a meal.      Historical Provider, MD  isosorbide mononitrate (IMDUR) 60 MG 24 hr tablet Take 1.5 tablets (  90mg ) by mouth daily 04/05/16   Laurey Moralealton S McLean, MD  LEVEMIR FLEXTOUCH 100 UNIT/ML Pen Inject 15 Units into the skin 2 (two) times daily.  07/17/13   Historical Provider, MD  lisinopril (PRINIVIL,ZESTRIL) 40 MG tablet Take 20 mg by mouth daily.     Historical Provider, MD  metoprolol (LOPRESSOR) 25 MG tablet Take 0.5 tablets (12.5 mg total) by mouth 2 (two) times daily. 08/03/14   Beatrice LecherScott T Weaver, PA-C  nitroGLYCERIN (NITROSTAT) 0.4 MG SL tablet Place 0.4 mg under the tongue every 5 (five) minutes as needed for chest pain (x 3 doses). For chest pain    Historical Provider, MD  Omega-3 Fatty Acids (FISH OIL) 1000 MG CAPS Take 1 capsule by mouth 2 (two) times daily.    Historical Provider, MD  ranitidine (ZANTAC) 150 MG tablet Take 150 mg by mouth 2 (two) times daily.      Historical Provider, MD   Family History  Family History  Problem Relation Age of Onset  . Heart attack Mother   . Heart attack Father   . Stroke Neg Hx    Social History  Social History   Social History  . Marital status: Single    Spouse name: N/A  . Number of children: 2  . Years of education: N/A   Occupational History  . retired    Social History Main Topics  . Smoking status: Never Smoker  . Smokeless tobacco: Never Used  . Alcohol use No  . Drug use: No  . Sexual activity: Not on file   Other Topics Concern  . Not on file   Social History  Narrative  . No narrative on file    Review of Systems General:  No chills, fever, night sweats or weight changes.  Cardiovascular:  No chest pain, dyspnea on exertion, edema, orthopnea, palpitations, paroxysmal nocturnal dyspnea. Dermatological: No rash, lesions/masses Respiratory: No cough, dyspnea Urologic: No hematuria, dysuria Abdominal:   No nausea, vomiting, diarrhea, bright red blood per rectum, melena, or hematemesis Neurologic:  No visual changes, wkns, changes in mental status. All other systems reviewed and are otherwise negative except as noted above.  Physical Exam  Blood pressure 123/72, pulse 82, temperature 97.7 F (36.5 C), temperature source Oral, resp. rate 16, weight 214 lb 12.8 oz (97.4 kg), SpO2 99 %.  General: Pleasant, NAD Psych: Normal affect. Neuro: Alert and oriented X 3. Moves all extremities spontaneously. HEENT: Normal  Neck: Supple without bruits or JVD. Lungs:  Resp regular and unlabored, CTA. Heart: iRRR no s3, s4, or murmurs. Abdomen: Soft, non-tender, non-distended, BS + x 4.  Extremities: No clubbing, cyanosis or edema. DP/PT/Radials 2+ and equal bilaterally.  Labs  No results for input(s): CKTOTAL, CKMB, TROPONINI in the last 72 hours. Lab Results  Component Value Date   WBC 9.2 04/05/2016   HGB 13.4 04/05/2016   HCT 39.1 04/05/2016   MCV 90.5 04/05/2016   PLT 230 04/05/2016   No results for input(s): NA, K, CL, CO2, BUN, CREATININE, CALCIUM, PROT, BILITOT, ALKPHOS, ALT, AST, GLUCOSE in the last 168 hours.  Invalid input(s): LABALBU Lab Results  Component Value Date   CHOL 117 (L) 04/05/2016   HDL 25 (L) 04/05/2016   LDLCALC 30 04/05/2016   TRIG 311 (H) 04/05/2016   Radiology/Studies  No results found.  Echocardiogram - 10/2014 - Left ventricle: The cavity size was normal. There was moderate concentric hypertrophy with septal predominance. Systolic function was normal. The estimated ejection fraction was in the  range of  60% to 65%. Wall motion was normal; there were no regional wall motion abnormalities. Doppler parameters are consistent with abnormal left ventricular relaxation (grade 1 diastolic dysfunction). - Aorta: Aortic root dimension: 41 mm (ED). - Ascending aorta: The ascending aorta was mildly dilated. - Tricuspid valve: There was moderate regurgitation.  Stress test nuclear: 04/08/2016  Nuclear stress EF: 55%.  Blood pressure demonstrated a normal response to exercise.  No T wave inversion was noted during stress.  There was no ST segment deviation noted during stress.  Defect 1: There is a large defect of moderate severity.  Findings consistent with prior myocardial infarction.  This is an intermediate risk study.   Telemetry: a-fib, rate controlled 60-70 BPM     ASSESSMENT AND PLAN  1. New onset atrial fibrillation with chest pain - sec to relatively high HR for the patient, baseline HR 50'. We will start heparin drip, place a consult for case management for NOAC. Heparin until ACS ruled out. HR currently in 60', I will encourage him to walk to see his HR response. If ok, we will discharge and plan for an outpatient DCCV in 4 weeks. If difficult to manage HR sec to brady/tachy cardia we will plan for a TEE/DCCV on Monday. Continue metoprolol 12.5 mg po BID. CHADS-VASc - 5.  2. CAD, extensive history as described above, the last stress test on 03/2016, no ischemia, he is now chest pain free, the first troponin negative, we will continue to cycle, when negative x3, we will start NOAC. We will d/c aspirin with starting anticoagulation.   3. Hypertension - controlled  4. HLP - on atorvastatin  DVT PPX - Heparin drip   Signed, Tobias AlexanderKatarina Daven Pinckney, MD, [facility] 05/29/2016, 1:39 PM

## 2016-05-29 NOTE — Progress Notes (Signed)
Report received in patient's room via Rey RN using [employer] format, reviewed VS, POC and general condition of patient, assumed care of patient.

## 2016-05-30 ENCOUNTER — Other Ambulatory Visit: Payer: Self-pay | Admitting: Cardiology

## 2016-05-30 ENCOUNTER — Encounter (HOSPITAL_COMMUNITY): Payer: Self-pay | Admitting: Cardiology

## 2016-05-30 DIAGNOSIS — I4891 Unspecified atrial fibrillation: Secondary | ICD-10-CM | POA: Diagnosis not present

## 2016-05-30 DIAGNOSIS — I200 Unstable angina: Secondary | ICD-10-CM | POA: Diagnosis not present

## 2016-05-30 DIAGNOSIS — Z7901 Long term (current) use of anticoagulants: Secondary | ICD-10-CM

## 2016-05-30 DIAGNOSIS — I480 Paroxysmal atrial fibrillation: Secondary | ICD-10-CM

## 2016-05-30 HISTORY — DX: Long term (current) use of anticoagulants: Z79.01

## 2016-05-30 LAB — BASIC METABOLIC PANEL
ANION GAP: 9 (ref 5–15)
BUN: 22 mg/dL — ABNORMAL HIGH (ref 6–20)
CO2: 23 mmol/L (ref 22–32)
Calcium: 9.5 mg/dL (ref 8.9–10.3)
Chloride: 106 mmol/L (ref 101–111)
Creatinine, Ser: 1.23 mg/dL (ref 0.61–1.24)
GFR calc Af Amer: >60 mL/min (ref >60)
GFR, EST NON AFRICAN AMERICAN: 55 mL/min — AB (ref >60)
Glucose, Bld: 221 mg/dL — ABNORMAL HIGH (ref 65–99)
POTASSIUM: 4.1 mmol/L (ref 3.5–5.1)
SODIUM: 138 mmol/L (ref 135–145)

## 2016-05-30 LAB — GLUCOSE, CAPILLARY: Glucose-Capillary: 210 mg/dL — ABNORMAL HIGH (ref 65–99)

## 2016-05-30 LAB — CBC
HCT: 38.2 % — ABNORMAL LOW (ref 39.0–52.0)
Hemoglobin: 13.5 g/dL (ref 13.0–17.0)
MCH: 31.1 pg (ref 26.0–34.0)
MCHC: 35.3 g/dL (ref 30.0–36.0)
MCV: 88 fL (ref 78.0–100.0)
PLATELETS: 208 10*3/uL (ref 150–400)
RBC: 4.34 MIL/uL (ref 4.22–5.81)
RDW: 12.5 % (ref 11.5–15.5)
WBC: 10.1 10*3/uL (ref 4.0–10.5)

## 2016-05-30 LAB — LIPID PANEL
CHOL/HDL RATIO: 3.5 ratio
CHOLESTEROL: 112 mg/dL (ref 0–200)
HDL: 32 mg/dL — AB (ref >40)
LDL Cholesterol: 56 mg/dL (ref 0–99)
TRIGLYCERIDES: 122 mg/dL (ref <150)
VLDL: 24 mg/dL (ref 0–40)

## 2016-05-30 LAB — HEMOGLOBIN A1C
Hgb A1c MFr Bld: 7.5 % — ABNORMAL HIGH (ref 4.8–5.6)
MEAN PLASMA GLUCOSE: 169 mg/dL

## 2016-05-30 LAB — TROPONIN I: Troponin I: 0.04 ng/mL (ref <0.03)

## 2016-05-30 LAB — HEPARIN LEVEL (UNFRACTIONATED): Heparin Unfractionated: 0.68 IU/mL (ref 0.30–0.70)

## 2016-05-30 MED ORDER — APIXABAN 5 MG PO TABS: 5.0000 mg | ORAL_TABLET | Freq: Two times a day (BID) | ORAL | 11 refills | Status: DC

## 2016-05-30 MED ORDER — FAMOTIDINE 20 MG PO TABS: 20.0000 mg | ORAL_TABLET | Freq: Two times a day (BID) | ORAL | 2 refills | Status: DC

## 2016-05-30 MED ORDER — APIXABAN 5 MG PO TABS: 5.0000 mg | ORAL_TABLET | Freq: Two times a day (BID) | ORAL | 0 refills | Status: DC

## 2016-05-30 MED ORDER — AMLODIPINE BESYLATE 5 MG PO TABS: 5.0000 mg | ORAL_TABLET | Freq: Every day | ORAL | 6 refills | Status: DC

## 2016-05-30 MED ORDER — APIXABAN 5 MG PO TABS
5.0000 mg | ORAL_TABLET | Freq: Two times a day (BID) | ORAL | Status: DC
  Administered 2016-05-30: 5 mg via ORAL
  Filled 2016-05-30: qty 1

## 2016-05-30 NOTE — Discharge Instructions (Signed)
Stop Asprin you will be on Eliquis and Plavix instead.  Our office will arrange an outpt event monitor for you to wear they will call you to have placed to evaluate if anymore atrial fib.  If you do have more please call the office.  Heart Healthy Diabetic Diet.   Call if any bleeding.  We changed your zantac to Pepcid to see if this helps abdominal pain

## 2016-05-30 NOTE — Discharge Summary (Signed)
Physician Discharge Summary       Patient ID: Joseph [name] MRN: [id number] DOB/AGE: 77-15-1942 77 y.o.  Admit date: 05/29/2016 Discharge date: 05/30/2016   Primary Cardiologist:Dr. Shirlee LatchMcLean   Discharge Diagnoses:  Principal Problem:   Unstable angina [facility](HCC) Active Problems:   New onset atrial fibrillation (HCC)   Type 2 diabetes mellitus with vascular disease (HCC)   Pure hypercholesterolemia   Essential hypertension   CAD S/P percutaneous coronary angioplasty   Anticoagulated   Discharged Condition: good  Procedures: none  Hospital Course:  75yo with history of CAD s/p anterior MI in 2004 and LAD/RCA PCIs as well as NSTEMI in 1/16 with mLAD and m/dLCx PCIs presents for cardiology followup. He had recurrent NSTEMI in 1/16. LHC showed severe in-stent restenosis in the mLAD and severe disease in the mid to distal LCx. He had PCI with DES to mLAD and DES to mid and distal LCx. He had recurrent chest pain in 4/16 with Cardiolite showing anterolateral ischemia. Repeat cath showed patent stents but jailed D1 with 80-90% ostial stenosis. This was managed medically. He is followed in clinic by Dr Shirlee LatchMcLean, the last seen on 04/05/2016, when he was doing well. He has been stable recently. He has chronic mild angina: chest discomfort with heavy exertion like climbing several times up a ladder. This happens maybe once every couple of weeks.   [facility] after he developed burning chest while working in his back yard on day prior, he felt palpitations at the time, the left sided chest pain persisted till this morning, when he presented to the ER he was found to be in atrial fibrillation. His HR was 80-90 what is unusual for him as his baseline is in 50'.  Pt admitted and placed on on IV Heparin.   CHADS-VASc - 5.  With plan for TEE/DCCV but pt converted spontaneously to SR.  He will be on Eliquis 5 mg BID and plavix for CAD.  He will need outpt event  monitor. For his chest pain troponins mildly elevated at 0.04 ;0.05 felt to be due to a fib with recent non ischemic myovue.  He had some abdominal pain but exam benign and eating BK.  Pt has been seen and evaluated by Dr. Eden EmmsNishan and found stable for discharge.  He will follow up for event monitor and OV.       Consults: None  Significant Diagnostic Studies:  BMP Latest Ref Rng & Units 05/30/2016 05/29/2016 04/05/2016  Glucose 65 - 99 mg/dL 865(H221(H) 846(N264(H) 629(B230(H)  BUN 6 - 20 mg/dL 28(U22(H) 20 22  Creatinine 0.61 - 1.24 mg/dL 1.321.23 4.401.21 1.02(V1.22(H)  Sodium 135 - 145 mmol/L 138 138 138  Potassium 3.5 - 5.1 mmol/L 4.1 4.5 4.7  Chloride 101 - 111 mmol/L 106 106 103  CO2 22 - 32 mmol/L 23 26 26   Calcium 8.9 - 10.3 mg/dL 9.5 9.5 9.3   CBC Latest Ref Rng & Units 05/30/2016 05/29/2016 04/05/2016  WBC 4.0 - 10.5 K/uL 10.1 8.0 9.2  Hemoglobin 13.0 - 17.0 g/dL 25.313.5 66.413.6 40.313.4  Hematocrit 39.0 - 52.0 % 38.2(L) 38.9(L) 39.1  Platelets 150 - 400 K/uL 208 219 230   Troponin 0.04; 0.05; 0.04   Lipid Panel     Component Value Date/Time   CHOL 112 05/30/2016 0201   TRIG 122 05/30/2016 0201   HDL 32 (L) 05/30/2016 0201   CHOLHDL 3.5 05/30/2016 0201   VLDL 24 05/30/2016 0201   LDLCALC 56 05/30/2016  0201   LDLDIRECT 83.0 10/28/2014 1622   TSH 1.483 Free T4 0.89  EKG SR with 1st degree AV block.   Discharge Exam: Blood pressure (!) 146/76, pulse 67, temperature 97.7 F (36.5 C), temperature source Oral, resp. rate 16, weight 208 lb 6.4 oz (94.5 kg), SpO2 96 %.  Disposition: 01-Home or Self Care     Medication List    STOP taking these medications   aspirin EC 81 MG tablet   ranitidine 150 MG tablet Commonly known as:  ZANTAC     TAKE these medications   acetaminophen 650 MG CR tablet Commonly known as:  TYLENOL Take 1,300 mg by mouth every 8 (eight) hours as needed for pain.   amLODipine 5 MG tablet Commonly known as:  NORVASC Take 1 tablet (5 mg total) by mouth daily. Start taking  on:  05/31/2016 What changed:  Another medication with the same name was removed. Continue taking this medication, and follow the directions you see here.   apixaban 5 MG Tabs tablet Commonly known as:  ELIQUIS Take 1 tablet (5 mg total) by mouth 2 (two) times daily.   atorvastatin 10 MG tablet Commonly known as:  LIPITOR Take 1 tablet (10 mg total) by mouth daily.   clopidogrel 75 MG tablet Commonly known as:  PLAVIX TAKE ONE TABLET BY MOUTH ONCE DAILY   CO Q 10 PO Take 1 tablet by mouth daily.   famotidine 20 MG tablet Commonly known as:  PEPCID Take 1 tablet (20 mg total) by mouth 2 (two) times daily.   Fish Oil 1000 MG Caps Take 1,000 mg by mouth 2 (two) times daily.   fluticasone 50 MCG/ACT nasal spray Commonly known as:  FLONASE Place 2 sprays into both nostrils daily as needed for allergies or rhinitis.   glipiZIDE 10 MG tablet Commonly known as:  GLUCOTROL Take 10 mg by mouth 2 (two) times daily before a meal.   isosorbide mononitrate 60 MG 24 hr tablet Commonly known as:  IMDUR Take 1.5 tablets (90mg ) by mouth daily What changed:  how much to take  how to take this  when to take this  additional instructions   LEVEMIR FLEXTOUCH 100 UNIT/ML Pen Generic drug:  Insulin Detemir Inject 20 Units into the skin 2 (two) times daily before a meal.   lisinopril 40 MG tablet Commonly known as:  PRINIVIL,ZESTRIL Take 20 mg by mouth daily.   metoprolol tartrate 25 MG tablet Commonly known as:  LOPRESSOR Take 0.5 tablets (12.5 mg total) by mouth 2 (two) times daily.   nitroGLYCERIN 0.4 MG SL tablet Commonly known as:  NITROSTAT Place 0.4 mg under the tongue every 5 (five) minutes as needed for chest pain (x 3 doses).      Follow-up Information    Marca Anconaalton McLean, MD Follow up on 06/02/2016.   Specialty:  Cardiology Why:  at 4:15 pm  Contact information: [address] [place] [address] [phone]            Discharge  Instructions:  Stop Asprin you will be on Eliquis and Plavix instead.  Our office will arrange an outpt event monitor for you to wear they will call you to have placed to evaluate if anymore atrial fib.  If you do have more please call the office.  Heart Healthy Diabetic Diet.   Call if any bleeding.  We changed your zantac to Pepcid to see if this helps abdominal pain   Signed: Nada BoozerLaura Kameela Leipold Nurse  Practitioner-Certified [facility]: [facility] 05/30/2016, 9:37 AM  Time spent on discharge : > 30 minutes.

## 2016-05-30 NOTE — Progress Notes (Signed)
Patient ID: Joseph NissenDonald W [name], male   DOB: 24-Jan-1941, 75 y.o.   MRN: [id number]   Patient Name: Joseph NissenDonald W [name] Date of Encounter: 05/30/2016  [facility] Cardi[facility]  [facility]  Hospital Problem List     Principal Problem:   Unstable angina [facility](HCC) Active Problems:   Type 2 diabetes mellitus with vascular disease (HCC)   Pure hypercholesterolemia   Essential hypertension   CAD S/P percutaneous coronary angioplasty   New onset atrial fibrillation (HCC)     Subjective   No complaints Converted to NSR   Inpatient Medications    Scheduled Meds: . amLODipine  5 mg Oral Daily  . atorvastatin  10 mg Oral q1800  . clopidogrel  75 mg Oral Daily  . famotidine  20 mg Oral BID  . fluticasone  2 spray Each Nare Daily  . glipiZIDE  10 mg Oral BID AC  . insulin detemir  15 Units Subcutaneous BID  . isosorbide mononitrate  90 mg Oral Daily  . lisinopril  20 mg Oral Daily  . metoprolol tartrate  12.5 mg Oral BID  . omega-3 acid ethyl esters  1 g Oral BID   Continuous Infusions: . heparin 1,550 Units/hr (05/30/16 0221)   PRN Meds: acetaminophen, nitroGLYCERIN, ondansetron (ZOFRAN) IV   Vital Signs    Vitals:   05/29/16 1640 05/29/16 2134 05/29/16 2206 05/30/16 0425  BP: (!) 143/82 (!) 160/86 132/74 126/62  Pulse: 70 70 73 (!) 55  Resp:   18 16  Temp: 98.1 F (36.7 C)  98.7 F (37.1 C) 97.7 F (36.5 C)  TempSrc: Oral  Oral Oral  SpO2: 95%  98% 96%  Weight:    94.5 kg (208 lb 6.4 oz)    Intake/Output Summary (Last 24 hours) at 05/30/16 0746 Last data filed at 05/30/16 0600  Gross per 24 hour  Intake              306 ml  Output             1000 ml  Net             -694 ml   Filed Weights   05/29/16 1100 05/30/16 0425  Weight: 97.4 kg (214 lb 12.8 oz) 94.5 kg (208 lb 6.4 oz)    Physical Exam    GEN: Well nourished, well developed, in no acute distress.  HEENT: Grossly normal.  Neck: Supple, no JVD, carotid bruits, or masses. Cardiac: RRR, no murmurs, rubs, or gallops. No  clubbing, cyanosis, edema.  Radials/DP/PT 2+ and equal bilaterally.  Respiratory:  Respirations regular and unlabored, clear to auscultation bilaterally. GI: Soft, nontender, nondistended, BS + x 4. MS: no deformity or atrophy. Skin: warm and dry, no rash. Neuro:  Strength and sensation are intact. Psych: AAOx3.  Normal affect.  Labs    CBC  Recent Labs  05/29/16 1508 05/30/16 0201  WBC 8.0 10.1  NEUTROABS 5.2  --   HGB 13.6 13.5  HCT 38.9* 38.2*  MCV 88.2 88.0  PLT 219 208   Basic Metabolic Panel  Recent Labs  05/29/16 1508 05/30/16 0201  NA 138 138  K 4.5 4.1  CL 106 106  CO2 26 23  GLUCOSE 264* 221*  BUN 20 22*  CREATININE 1.21 1.23  CALCIUM 9.5 9.5  MG 2.0  --    Liver Function Tests  Recent Labs  05/29/16 1508  AST 16  ALT 16*  ALKPHOS 46  BILITOT 1.0  PROT 5.9*  ALBUMIN 3.8  No results for input(s): LIPASE, AMYLASE in the last 72 hours. Cardiac Enzymes  Recent Labs  05/29/16 1508 05/29/16 1903 05/30/16 0201  TROPONINI 0.04* 0.05* 0.04*   BNP Invalid input(s): POCBNP D-Dimer No results for input(s): DDIMER in the last 72 hours. Hemoglobin A1C No results for input(s): HGBA1C in the last 72 hours. Fasting Lipid Panel  Recent Labs  05/30/16 0201  CHOL 112  HDL 32*  LDLCALC 56  TRIG 161122  CHOLHDL 3.5   Thyroid Function Tests  Recent Labs  05/29/16 1508  TSH 1.483    Telemetry    NSR 05/30/2016  - Personally Reviewed  ECG    SR PR 214 no acute ST changes  - Personally Reviewed  Radiology    No results found.  Cardiac Studies   04/08/16 myovue EF 55% anterosepal infarct no ischemia  Patient Profile     75 y.o. new onset Afib and abdominal pain Spontaneous conversion  Assessment & Plan    PAF: This patients CHA2DS2-VASc Score and unadjusted Ischemic Stroke Rate (% per year) is equal to 9.7 % stroke rate/year from a score of 6  Above score calculated as 1 point each if present [CHF, HTN, DM,  Vascular=MI/PAD/Aortic Plaque, Age if 65-74, or Male] Above score calculated as 2 points each if present [Age > 75, or Stroke/TIA/TE]  Converted spontaneously d/c with eliquis 5 bid and plavix for CAD 21 day event monitor  CAD:  R/O no chest pain recent non ischemic myovue medical Rx  Abdominal pain: labs ok benign exam eating breakfast observe  D/C home         Signed, Charlton HawsPeter Jessia Kief, MD  05/30/2016, 7:46 AM

## 2016-05-30 NOTE — Progress Notes (Addendum)
ANTICOAGULATION CONSULT NOTE - Follow Up Consult  Pharmacy Consult for Apixaban and Heparin  Indication: chest pain/ACS and atrial fibrillation  No Known Allergies  Patient Measurements: Weight: 208 lb 6.4 oz (94.5 kg)  Vital Signs: Temp: 97.7 F (36.5 C) (11/12 0425) Temp Source: Oral (11/12 0425) BP: 146/76 (11/12 0759) Pulse Rate: 67 (11/12 0804)  Labs:  Recent Labs  05/29/16 1508 05/29/16 1903 05/29/16 2249 05/30/16 0201  HGB 13.6  --   --  13.5  HCT 38.9*  --   --  38.2*  PLT 219  --   --  208  LABPROT 13.8  --   --   --   INR 1.05  --   --   --   HEPARINUNFRC  --   --  0.64 0.68  CREATININE 1.21  --   --  1.23  TROPONINI 0.04* 0.05*  --  0.04*    Estimated Creatinine Clearance: 58.5 mL/min (by C-G formula based on SCr of 1.23 mg/dL).   Assessment: Was originally started on heparin for new onset afib, also having some chest pain. Physician would like to transition to qliquis today -HL 0.68 at goal this AM -CBC stable and WNL -No signs of bleeding --75 YO, 94.5kg, SCr 1.23  Goal of Therapy:  Heparin level 0.3-0.7 units/ml Monitor platelets by anticoagulation protocol: Yes   Plan:  -Continue heparin at 1550 units/hr until 1000AM and start apixaban 5mg  BID at same time as heparin d/c -Monitor S/Sx of bleeding  Gwyndolyn KaufmanKai Rozina Pointer Bernette Redbird(Kenny), PharmD  PGY1 Pharmacy Resident Pager: [phone] 05/30/2016 8:10 AM

## 2016-05-30 NOTE — Care Management Note (Signed)
Case Management Note  Patient Details  Name: Joseph [name] MRN: [id number] Date of Birth: 1941/06/06  Subjective/Objective:                  chest pain/ACS and atrial fibrillation Action/Plan: Discharge planning Expected Discharge Date:  05/30/16               Expected Discharge Plan:  Home/Self Care  In-House Referral:     Discharge planning Services  CM Consult  Post Acute Care Choice:    Choice offered to:  Patient  DME Arranged:    DME Agency:     HH Arranged:    HH Agency:     Status of Service:  Completed, signed off  If discussed at [employer] Length of Stay Meetings, dates discussed:    Additional Comments: CM gave pt free 30 day trial for Eliquis. Pt verbalizes understanding this card will pay for today's discharge prescriptions and give insurance time to authorize for refills. No other CM needs were communicated. Yves DillJeffries, Wilma Wuthrich Christine, RN 05/30/2016, 9:51 AM

## 2016-05-30 NOTE — Care Management Obs Status (Signed)
MEDICARE OBSERVATION STATUS NOTIFICATION   Patient Details  Name: Joseph [name] MRN: [id number] Date of Birth: Mar 31, 1941   Medicare Observation Status Notification Given:  Yes    Darcel Smallingnna C Oliana Gowens, RN 05/30/2016, 10:03 AM

## 2016-06-02 ENCOUNTER — Ambulatory Visit (INDEPENDENT_AMBULATORY_CARE_PROVIDER_SITE_OTHER): Payer: Medicare Other | Admitting: Physician Assistant

## 2016-06-02 ENCOUNTER — Encounter: Payer: Self-pay | Admitting: Physician Assistant

## 2016-06-02 ENCOUNTER — Encounter (INDEPENDENT_AMBULATORY_CARE_PROVIDER_SITE_OTHER): Payer: Self-pay

## 2016-06-02 VITALS — BP 138/68 | HR 57 | Ht 74.0 in | Wt 214.0 lb

## 2016-06-02 DIAGNOSIS — I2510 Atherosclerotic heart disease of native coronary artery without angina pectoris: Secondary | ICD-10-CM

## 2016-06-02 DIAGNOSIS — I480 Paroxysmal atrial fibrillation: Secondary | ICD-10-CM

## 2016-06-02 DIAGNOSIS — E7800 Pure hypercholesterolemia, unspecified: Secondary | ICD-10-CM | POA: Diagnosis not present

## 2016-06-02 DIAGNOSIS — I208 Other forms of angina pectoris: Secondary | ICD-10-CM

## 2016-06-02 DIAGNOSIS — I10 Essential (primary) hypertension: Secondary | ICD-10-CM

## 2016-06-02 MED ORDER — FISH OIL 1000 MG PO CAPS: 4000.0000 mg | ORAL_CAPSULE | Freq: Every day | ORAL | 11 refills | Status: DC

## 2016-06-02 NOTE — Progress Notes (Signed)
Cardiology Office Note    Date:  06/02/2016   ID:  Joseph [name], DOB 1941/06/20, MRN [id number]  PCP:  Dema SeverinYORK,REGINA F, NP  Cardiologist:  Dr. Shirlee LatchMcLean  Chief Complaint: Hospital follow up for new onset afib  History of Present Illness:   Joseph [name] is a 75 y.o. male with hx of CAD, HTN, HLD, small CVA seen by head CT (incidental in 1/13) and recent admission of afib who presented for follow up.   History of CAD s/p anterior MI in 2004 and LAD/RCA PCIs. He had recurrent NSTEMI in 1/16. LHC showed severe in-stent restenosis in the mLAD and severe disease in the mid to distal LCx. He had PCI with DES to mLAD and DES to mid and distal LCx. He had recurrent chest pain in 4/16 with Cardiolite showing anterolateral ischemia. Repeat cath showed patent stents but jailed D1 with 80-90% ostial stenosis. This was managed medically. He is followed in clinic by Dr Shirlee LatchMcLean, the last seen on 04/05/2016, when he was doing well. Last stress test 04/08/16 showed no ischemia.   [facility] with chest pain and noted to be in atrial fibrillation. His HR was 80-90 what is unusual for him as his baseline is in 50'. He was placed on on IV Heparin and [facility] with plan for [facility]/DCCV but pt converted spontaneously to SR.  He will be on Eliquis 5 mg BID and plavix for CAD. ASA discontinued. For his chest pain, troponins mildly elevated at 0.04 ;0.05 felt to be due to a fib with recent non ischemic myoview.   Here today for follow up. The patient denies nausea, vomiting, fever, chest pain, palpitations, shortness of breath, orthopnea, PND, dizziness, syncope, cough, congestion, abdominal pain, hematochezia, melena, lower extremity edema. Taking Fish oil 4000mg  daily. Feeling better.    Past Medical History:  Diagnosis Date  . Abdominal discomfort    Abdominal discomfort: EGD 2011 showed no significant abnormalities.  Possibly due to metformin.  . Allergic rhinitis     . Anticoagulated 05/30/2016  . CAD (coronary artery disease)    a. s/p MI in 2004:  stents to the LAD and staged stent RCA, EF 55%;  b.  NSTEMI (1/16):  LHC - mid LAD stent with diff dist restenosis, prox to mid Dx mod dsz jailed by stent, small OM1 99, mCFX 75, dCFX 80, mRCA stent ok, mPDA mild to mod dsz, EF 55% >> PCI:  Synergy DES to LAD; Synergy DES (x2) mid and dist CFX  . 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  s/p back surgery  . DM (diabetes mellitus) (HCC)   . History of CVA (cerebrovascular accident)    Small CVA seen by head CT (incidental) in 1/13.  Carotid dopplers in 1/13 showed minimal disease  . History of Doppler ultrasound    Arterial dopplers (3/11): no evidence for significant PAD. ABIs 10/12: Normal.    . HLD (hyperlipidemia)   . HTN (hypertension)    had side effects with chlorthalidone  . Hx of cardiovascular stress test    Myoview (1/13): Small fixed apical septal defect with no ischemia, EF 58%.  [name]. Hx of echocardiogram    Echo (3/11): EF 60%, normal wall motion, mild MR, mild left atrial enlargement, mildly dilated ascending aorta.  [name]. PAF (paroxysmal atrial fibrillation) (HCC) 05/29/2016  . Spondylolisthesis    L4-S1    Past Surgical History:  Procedure Laterality Date  . CORONARY ANGIOGRAM  11/13/2014   Procedure: CORONARY ANGIOGRAM;  Surgeon: Laurey Morale, MD;  Location: [facility] CATH LAB;  Service: Cardiovascular;;  . CORONARY STENT PLACEMENT  2004   x2 LAD and RCA   . LEFT HEART CATHETERIZATION WITH CORONARY ANGIOGRAM N/A 08/02/2014   Procedure: LEFT HEART CATHETERIZATION WITH CORONARY ANGIOGRAM;  Surgeon: Corky Crafts, MD;  Location: [facility] CATH LAB;  Service: Cardiovascular;  Laterality: N/A;  . PROSTATE SURGERY    . SPINAL FUSION     L4-S1    Current Medications: Prior to Admission medications   Medication Sig Start Date End Date Taking? Authorizing Provider  acetaminophen (TYLENOL)  650 MG CR tablet Take 1,300 mg by mouth every 8 (eight) hours as needed for pain.    Historical Provider, MD  amLODipine (NORVASC) 5 MG tablet Take 1 tablet (5 mg total) by mouth daily. 05/31/16   Leone Brand, NP  apixaban (ELIQUIS) 5 MG TABS tablet Take 1 tablet (5 mg total) by mouth 2 (two) times daily. 05/30/16   Leone Brand, NP  atorvastatin (LIPITOR) 10 MG tablet Take 1 tablet (10 mg total) by mouth daily. 12/27/14   Laurey Morale, MD  clopidogrel (PLAVIX) 75 MG tablet TAKE ONE TABLET BY MOUTH ONCE DAILY 01/22/16   Laurey Morale, MD  Coenzyme Q10 (CO Q 10 PO) Take 1 tablet by mouth daily.     Historical Provider, MD  famotidine (PEPCID) 20 MG tablet Take 1 tablet (20 mg total) by mouth 2 (two) times daily. 05/30/16   Leone Brand, NP  fluticasone (FLONASE) 50 MCG/ACT nasal spray Place 2 sprays into both nostrils daily as needed for allergies or rhinitis.  03/31/15   Historical Provider, MD  glipiZIDE (GLUCOTROL) 10 MG tablet Take 10 mg by mouth 2 (two) times daily before a meal.      Historical Provider, MD  Insulin Detemir (LEVEMIR FLEXTOUCH) 100 UNIT/ML Pen Inject 20 Units into the skin 2 (two) times daily before a meal.    Historical Provider, MD  isosorbide mononitrate (IMDUR) 60 MG 24 hr tablet Take 1.5 tablets (90mg ) by mouth daily Patient taking differently: Take 90 mg by mouth daily.  04/05/16   Laurey Morale, MD  lisinopril (PRINIVIL,ZESTRIL) 40 MG tablet Take 20 mg by mouth daily.     Historical Provider, MD  metoprolol (LOPRESSOR) 25 MG tablet Take 0.5 tablets (12.5 mg total) by mouth 2 (two) times daily. 08/03/14   Beatrice Lecher, PA-C  nitroGLYCERIN (NITROSTAT) 0.4 MG SL tablet Place 0.4 mg under the tongue every 5 (five) minutes as needed for chest pain (x 3 doses).     Historical Provider, MD  Omega-3 Fatty Acids (FISH OIL) 1000 MG CAPS Take 1,000 mg by mouth 2 (two) times daily.     Historical Provider, MD    Allergies:   Patient has no known allergies.   Social  History   Social History  . Marital status: Single    Spouse name: N/A  . Number of children: 2  . Years of education: N/A   Occupational History  . retired    Social History Main Topics  . Smoking status: Never Smoker  . Smokeless tobacco: Never Used  . Alcohol use No  . Drug use: No  . Sexual activity: Not Asked   Other Topics Concern  . None   Social History Narrative  . None     Family History:  The patient's family  history includes Heart attack in his father and mother.   ROS:   Please see the history of present illness.    ROS All other systems reviewed and are negative.   PHYSICAL EXAM:   VS:  BP 138/68 (BP Location: Right Arm, Patient Position: Sitting, Cuff Size: Large)   Pulse (!) 57   Ht 6\' 2"  (1.88 m)   Wt 214 lb (97.1 kg)   BMI 27.48 kg/m    GEN: Well nourished, well developed, in no acute distress  HEENT: normal  Neck: no JVD, carotid bruits, or masses Cardiac: RRR; no murmurs, rubs, or gallops,no edema  Respiratory:  clear to auscultation bilaterally, normal work of breathing GI: soft, nontender, nondistended, + BS MS: no deformity or atrophy  Skin: warm and dry, no rash Neuro:  Alert and Oriented x 3, Strength and sensation are intact Psych: euthymic mood, full affect  Wt Readings from Last 3 Encounters:  06/02/16 214 lb (97.1 kg)  05/30/16 208 lb 6.4 oz (94.5 kg)  04/08/16 212 lb (96.2 kg)      Studies/Labs Reviewed:   EKG:  EKG is ordered today.  The ekg ordered today demonstrates NSR at rate of 66 bpm.   Recent Labs: 05/29/2016: ALT 16; B Natriuretic Peptide 127.1; Magnesium 2.0; TSH 1.483 05/30/2016: BUN 22; Creatinine, Ser 1.23; Hemoglobin 13.5; Platelets 208; Potassium 4.1; Sodium 138   Lipid Panel    Component Value Date/Time   CHOL 112 05/30/2016 0201   TRIG 122 05/30/2016 0201   HDL 32 (L) 05/30/2016 0201   CHOLHDL 3.5 05/30/2016 0201   VLDL 24 05/30/2016 0201   LDLCALC 56 05/30/2016 0201   LDLDIRECT 83.0 10/28/2014  1622    Additional studies/ records that were reviewed today include:   Echocardiogram - 10/2014 - Left ventricle: The cavity size was normal. There was moderate concentric hypertrophy with septal predominance. Systolic function was normal. The estimated ejection fraction was in the range of 60% to 65%. Wall motion was normal; there were no regional wall motion abnormalities. Doppler parameters are consistent with abnormal left ventricular relaxation (grade 1 diastolic dysfunction). - Aorta: Aortic root dimension: 41 mm (ED). - Ascending aorta: The ascending aorta was mildly dilated. - Tricuspid valve: There was moderate regurgitation.  Cath 10/2014 Procedural Findings: Hemodynamics: AO 113/53  Coronary angiography: Coronary dominance: right  Left mainstem: 30-40% distal LM stenosis.   Left anterior descending (LAD): The mid LAD stent is patent with luminal irregularities. There is mild to moderate rather diffuse distal LAD stenosis.  There is a moderate D1 with a long area of disease proximally reaching 80-90%.  The ostium of the diagonal does appear to be covered by the stent (similar to prior study).   Left circumflex (LCx): There is a small high OM1 that is totally occluded proximally (similar to prior study).  Very small OM2 looks ok. Small OM3 with 90% proximal stenosis (similar to prior study). Large PLOM with luminal irregularities.  The stents in the mid AV LCx were patent.   Right coronary artery (RCA): Luminal irregularities in RCA.  Small PLV with serial 50% stenoses.  PDA with 40-50% mid vessel stenosis and 30% distal vessel stenosis.   Left ventriculography: Not done, recent echo.  Final Conclusions:  Stents to LCx and LAD from 1/16 are all patent.  He has exertional chest pain and anterolateral ischemia on Cardiolite.  I suspect that the culprit vessel is the moderate diagonal with diffuse proximal disease (up to 80-90%).  I reviewed the  films with  Dr Clifton James.  This vessel is in a difficult position as the ostium appears to be covered by possibly 2 layers of stent.  We will plan initial medical management. Start ranolazine 500 mg bid.  Will need followup in 2-3 weeks.   Stress test nuclear: 04/08/2016  Nuclear stress EF: 55%.  Blood pressure demonstrated a normal response to exercise.  No T wave inversion was noted during stress.  There was no ST segment deviation noted during stress.  Defect 1: There is a large defect of moderate severity.  Findings consistent with prior myocardial infarction.  This is an intermediate risk study.   ASSESSMENT & PLAN:    1. PAF - Spontaneously converted to NSR during admission. CHADSVASCs score of 6.  On Eliquis for anticoagulation. Will get monitor to evaluate afib as recommended during discharge. Continue BB. Maintaining sinus rhythm today. No bleeding.   2. CAD s/p multiple PCI as described above - Last Myoview 04/08/16 showed no ishcemia. Continue Plavix. Discontinued ASA due to need of anticoagulation.  - No chest pain or dyspnea.   3. HTN - Stable and well controlled on current regimen.   4. HLD - 05/30/2016: Cholesterol 112; HDL 32; LDL Cholesterol 56; Triglycerides 122; VLDL 24  - Continue statin. Triglyceride level improved on fish oil 4000mg  qd.   5. DM - A1c of 7.5. Per PCP.     Medication Adjustments/Labs and Tests Ordered: Current medicines are reviewed at length with the patient today.  Concerns regarding medicines are outlined above.  Medication changes, Labs and Tests ordered today are listed in the Patient Instructions below. Patient Instructions  Medication Instructions:  Georgianne Fick has called in for you a prescription for FISH OIL 4000 mg daily  Labwork: None  Testing/Procedures: No new orders.  Follow-Up: Please keep your office visit with Dr. Shirlee Latch 12/15.  Any Other Special Instructions Will Be Listed Below (If Applicable).     If you need a refill on  your cardiac medications before your next appointment, please call your pharmacy.      Lorelei Pont, [place]  06/02/2016 3:25 PM    [facility] [address] [place], [place], [place]  [address] Phone: [phone]; Fax: [phone]

## 2016-06-02 NOTE — Patient Instructions (Addendum)
Medication Instructions:  Joseph [name] has called in for you a prescription for FISH OIL 4000 mg daily  Labwork: None  Testing/Procedures: No new orders.  Follow-Up: Please keep your office visit with Dr. Shirlee LatchMcLean 12/15.  Any Other Special Instructions Will Be Listed Below (If Applicable).     If you need a refill on your cardiac medications before your next appointment, please call your pharmacy.

## 2016-06-03 ENCOUNTER — Other Ambulatory Visit: Payer: Self-pay | Admitting: Physician Assistant

## 2016-06-03 MED ORDER — FISH OIL 1000 MG PO CAPS: 4000.0000 mg | ORAL_CAPSULE | Freq: Every day | ORAL | 11 refills | Status: DC

## 2016-06-04 ENCOUNTER — Other Ambulatory Visit: Payer: Medicare Other

## 2016-06-07 ENCOUNTER — Other Ambulatory Visit: Payer: Self-pay

## 2016-06-07 MED ORDER — APIXABAN 5 MG PO TABS: 5.0000 mg | ORAL_TABLET | Freq: Two times a day (BID) | ORAL | 3 refills | Status: DC

## 2016-06-09 ENCOUNTER — Ambulatory Visit (INDEPENDENT_AMBULATORY_CARE_PROVIDER_SITE_OTHER): Payer: Medicare Other

## 2016-06-09 DIAGNOSIS — I480 Paroxysmal atrial fibrillation: Secondary | ICD-10-CM | POA: Diagnosis not present

## 2016-07-02 ENCOUNTER — Ambulatory Visit (INDEPENDENT_AMBULATORY_CARE_PROVIDER_SITE_OTHER): Payer: Medicare Other | Admitting: Cardiology

## 2016-07-02 ENCOUNTER — Encounter: Payer: Self-pay | Admitting: Cardiology

## 2016-07-02 VITALS — BP 150/80 | HR 75 | Ht 74.0 in | Wt 214.1 lb

## 2016-07-02 DIAGNOSIS — I10 Essential (primary) hypertension: Secondary | ICD-10-CM | POA: Diagnosis not present

## 2016-07-02 DIAGNOSIS — I4891 Unspecified atrial fibrillation: Secondary | ICD-10-CM

## 2016-07-02 DIAGNOSIS — I2510 Atherosclerotic heart disease of native coronary artery without angina pectoris: Secondary | ICD-10-CM

## 2016-07-02 DIAGNOSIS — Z9861 Coronary angioplasty status: Secondary | ICD-10-CM

## 2016-07-02 DIAGNOSIS — I208 Other forms of angina pectoris: Secondary | ICD-10-CM

## 2016-07-02 MED ORDER — LISINOPRIL 40 MG PO TABS: 40.0000 mg | ORAL_TABLET | Freq: Every day | ORAL | 0 refills | Status: DC

## 2016-07-02 NOTE — Patient Instructions (Signed)
Medication Instructions:  Stop Plavix.(clopidogrel)  Increase lisinopril to 40mg  daily.  Labwork: BMET in 2 weeks--I have given you an order for this-please fax the results to Dr [phone]  Testing/Procedures: None   Follow-Up: Your physician recommends that you schedule a follow-up appointment in: 6 weeks with Dr End.    If you need a refill on your cardiac medications before your next appointment, please call your pharmacy.

## 2016-07-04 NOTE — Progress Notes (Signed)
Patient ID: Joseph [name], male   DOB: 01/11/1941, 75 y.o.   MRN: [id number] PCP: Joseph [name] ([place])  75 yo with history of CAD s/p anterior MI in 2004 and LAD/RCA PCIs as well as NSTEMI in 1/16 with mLAD and m/dLCx PCIs  presents for cardiology followup.   He had recurrent NSTEMI in 1/16.  LHC showed severe in-stent restenosis in the mLAD and severe disease in the mid to distal LCx.  He had PCI with DES to mLAD and DES to mid and distal LCx.  He had recurrent chest pain in 4/16 with Cardiolite showing anterolateral ischemia.  Repeat cath showed patent stents but jailed D1 with 80-90% ostial stenosis.  This was managed medically.  Cardiolite in 9/17 showed fixed anteroseptal defect, no ischemia.   In 11/17, he felt "weak" and went [facility] ER [facility]. He was noted to be in atrial fibrillation with RVR.  He converted spontaneously to NSR.  He was started on apixaban.  He remains in NSR today.   Weight is stable.  No palpitations.  No chest pain or exertional dyspnea. No leg pain with ambulation.  No lightheadedness.    Labs (3/11): LDL 90, HDL 40, K 4.4, creatinine 1.0 Labs (4/11): K 5.1, creatinine 1.4 Labs (9/11): K 4.5, creatinine 0.97, LDL 83, HDL 35 Labs (3/12): HDL 29, LDL 74, K 4.6, creatinine 1.1 Labs (9/12): LDL 60, HDL 36 Labs (1/13): Cardiac enzymes negative, K 5.3, creatinine 1.28 Labs (2/14): K 4.9, creatinine 1.4, LDL 61, HDL 29 Labs (1/16): LDL 79, HDL 25 Labs (8/16): LDL 65, HDL 37 Labs (9/16): K 5.1, creatinine 1.27, HCT 40.6 Labs (11/17): K 4.1, creatinine 1.23, LDL 56, HDL 32  ECG: NSR, 1st degree AVB, old ASMI  Past Medical History: 1. 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  s/p back surgery.  2. Diabetes mellitus 3. Hypertension: had side effects with chlorthalidone.  4. Hyperlipidemia: intolerant to multiple statins due to muscle pain.  He is able to tolerate lovastatin and pravastatin.  5. CAD: Anterior MI in 2004.  Patient  had LAD and RCA PCI at the time.  Last myoview was in 7/07: EF 62%, small area of anteroapical infarct.  Echo (3/11): EF 60%, normal wall motion, mild MR, mild left atrial enlargement, mildly dilated ascending aorta.   Myoview (1/13): Small fixed apical septal defect with no ischemia, EF 58%. He had recurrent NSTEMI in 1/16.  LHC showed severe in-stent restenosis in the mLAD and severe disease in the mid to distal LCx.  He had PCI with DES to mLAD and DES to mid and distal LCx. Recurrent CP so had Cardiolite in 4/16 with EF 61%, anterolateral ischemia.  LHC (4/16) with patent LAD and LCx stents; there was an 80-90% ostial D1 stenosis, this appeared to be jailed by LAD stent.  Medical treatment. Echo (4/16) with EF 60-65%, mildly dilated ascending aorta at 4.1 cm, moderate TR.  - Lexiscan Cardiolite (9/17): EF 55%, fixed anteroseptal defect, no ischemia.  6.  Arterial dopplers (3/11): no evidence for significant PAD. ABIs 10/12: Normal.  ABIs normal in 10/16.  7.  Allergic rhinitis 8.  Abdominal discomfort: EGD 2011 showed no significant abnormalities.  Possibly due to metformin.  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  Carotid dopplers in 1/13 showed minimal disease.  11. Atrial fibrillation: Paroxysmal.  1st noted in 11/17.    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  Tobacco Use - No.  Alcohol Use - no Drug Use - no  ROS: All systems reviewed and negative except as per HPI.   Current Outpatient Prescriptions  Medication Sig Dispense Refill  . acetaminophen (TYLENOL) 650 MG CR tablet Take 1,300 mg by mouth every 8 (eight) hours as needed for pain.    [name]. amLODipine (NORVASC) 5 MG tablet Take 1 tablet (5 mg total) by mouth daily. 30 tablet 6  . apixaban (ELIQUIS) 5 MG TABS tablet Take 1 tablet (5 mg total) by mouth 2 (two) times daily. 180 tablet 3  . atorvastatin  (LIPITOR) 10 MG tablet Take 1 tablet (10 mg total) by mouth daily. 90 tablet 3  . Coenzyme Q10 (CO Q 10 PO) Take 1 tablet by mouth daily.     . fluticasone (FLONASE) 50 MCG/ACT nasal spray Place 2 sprays into both nostrils daily as needed for allergies or rhinitis.   0  . glipiZIDE (GLUCOTROL) 10 MG tablet Take 10 mg by mouth 2 (two) times daily before a meal.      . Insulin Detemir (LEVEMIR FLEXTOUCH) 100 UNIT/ML Pen Inject 20 Units into the skin 2 (two) times daily before a meal.    . isosorbide mononitrate (IMDUR) 60 MG 24 hr tablet Take 1.5 tablets (90mg ) by mouth daily 135 tablet 1  . metoprolol (LOPRESSOR) 25 MG tablet Take 0.5 tablets (12.5 mg total) by mouth 2 (two) times daily. 30 tablet 11  . nitroGLYCERIN (NITROSTAT) 0.4 MG SL tablet Place 0.4 mg under the tongue every 5 (five) minutes as needed for chest pain (x 3 doses).     . Omega-3 Fatty Acids (FISH OIL) 1000 MG CAPS Take 4 capsules (4,000 mg total) by mouth daily. 120 capsule 11  . lisinopril (PRINIVIL,ZESTRIL) 40 MG tablet Take 1 tablet (40 mg total) by mouth daily. 90 tablet 0   No current facility-administered medications for this visit.     BP (!) 150/80   Pulse 75   Ht 6\' 2"  (1.88 m)   Wt 214 lb 1.9 oz (97.1 kg)   SpO2 98%   BMI 27.49 kg/m  General: NAD Neck: No JVD, no thyromegaly or thyroid nodule.  Lungs: Clear to auscultation bilaterally with normal respiratory effort. CV: Nondisplaced PMI.  Heart regular S1/S2, no S3, no murmur.  +S4.  No peripheral edema.  No carotid bruit.  Unable to palpate pedal pulses.  Abdomen: Soft, nontender, no hepatosplenomegaly, no distention.  Neurologic: Alert and oriented x 3.  Psych: Normal affect. Extremities: No clubbing or cyanosis.   Assessment/Plan:  Atrial fibrillation Paroxysmal.  He is in NSR today.   - Continue apixaban, check CBC. - Given stable CAD and apixaban use, stop Plavix.    CAD  NSTEMI with DES x 3 in 1/16 to mLAD and m/dLCx.  Repeat cath was done in  4/16 after Cardiolite showed anterolateral ischemia.  Cath showed a D1 with 80-90% ostial stenosis, it was jailed by the stent.  This is the likely source of the Cardiolite defect and his chronic stable angina. Cardiolite in 9/17 showed no ischemia.  He has had no recent concerning chest pain.   - Given stable CAD and apixaban use, he is now off Plavix and ASA.  - Continue atorvastatin, lisinopril, metoprolol.  - Continue Imdur 90 daily.  HYPERTENSION  BP is high, increase lisinopril to 40 mg daily with BMET in 2 wks.  HYPERCHOLESTEROLEMIA Good lipids on atorvastatin.   Followup in 6 wks with Dr. Okey [name].    Joseph [name] 07/04/2016

## 2016-07-08 ENCOUNTER — Encounter: Payer: Self-pay | Admitting: Cardiology

## 2016-07-08 DIAGNOSIS — Z79899 Other long term (current) drug therapy: Secondary | ICD-10-CM | POA: Insufficient documentation

## 2016-07-20 ENCOUNTER — Telehealth: Payer: Self-pay

## 2016-07-20 ENCOUNTER — Telehealth: Payer: Self-pay | Admitting: Internal Medicine

## 2016-07-20 NOTE — Telephone Encounter (Signed)
Application for Eliquis 5 mg faxed to [employer].

## 2016-07-20 NOTE — Telephone Encounter (Signed)
Called patient's daughter back. PCP changed patient's insulin that has caused him to be weak, elevated BP, and make him feel like his heart is skipping beat. Patient is scheduled to see PCP in 30 minutes. Informed patient's daughter that he should keep the appointment with PCP, and have PCP evaluate patient and advise. Patient's daughter verbalized understanding and will call if they have any other questions or concerns.

## 2016-07-20 NOTE — Telephone Encounter (Signed)
[name] ( Daughter ) is calling because Mr. Joseph [name] is not feeling well. His Diabetic medication was changed and since then his heart has been skipping a beat . Please call

## 2016-08-19 ENCOUNTER — Ambulatory Visit (INDEPENDENT_AMBULATORY_CARE_PROVIDER_SITE_OTHER): Payer: Medicare Other | Admitting: Internal Medicine

## 2016-08-19 ENCOUNTER — Encounter: Payer: Self-pay | Admitting: Internal Medicine

## 2016-08-19 VITALS — BP 120/64 | HR 68 | Ht 73.0 in | Wt 213.1 lb

## 2016-08-19 DIAGNOSIS — I10 Essential (primary) hypertension: Secondary | ICD-10-CM

## 2016-08-19 DIAGNOSIS — I480 Paroxysmal atrial fibrillation: Secondary | ICD-10-CM

## 2016-08-19 DIAGNOSIS — E785 Hyperlipidemia, unspecified: Secondary | ICD-10-CM

## 2016-08-19 DIAGNOSIS — I2510 Atherosclerotic heart disease of native coronary artery without angina pectoris: Secondary | ICD-10-CM

## 2016-08-19 NOTE — Progress Notes (Signed)
Patient ID: Joseph NissenDonald W [name], male   DOB: May 25, 1941, 76 y.o.   MRN: [id number] PCP: Mauricio Poegina [place] ([place])  76 yo with history of CAD s/p anterior MI in 2004 and LAD/RCA PCIs as well as NSTEMI in 1/16 with mLAD and m/dLCx PCIs  presents for cardiology followup.   He had recurrent NSTEMI in 1/16.  LHC showed severe in-stent restenosis in the mLAD and severe disease in the mid to distal LCx.  He had PCI with DES to mLAD and DES to mid and distal LCx.  He had recurrent chest pain in 4/16 with Cardiolite showing anterolateral ischemia.  Repeat cath showed patent stents but jailed D1 with 80-90% ostial stenosis.  This was managed medically.  Cardiolite in 9/17 showed fixed anteroseptal defect, no ischemia.   Today, Joseph [name] reports feeling well.  He was recently switched from lisinopril to losartan by his PCP due to an itchy rash on his back.  The rash has improved but not completely resolved.  He continues to use cortisone 10 cream as needed; he has not seen a dermatologist.  Joseph [name] reports that his blood pressure has improved since switching to losartan.  He denies chest pain, shortness of breath, and palpitations.  He remains on apixaban without significant bleeding.  However, he is concerned about the cost of this medication.   Labs (3/11): LDL 90, HDL 40, K 4.4, creatinine 1.0 Labs (4/11): K 5.1, creatinine 1.4 Labs (9/11): K 4.5, creatinine 0.97, LDL 83, HDL 35 Labs (3/12): HDL 29, LDL 74, K 4.6, creatinine 1.1 Labs (9/12): LDL 60, HDL 36 Labs (1/13): Cardiac enzymes negative, K 5.3, creatinine 1.28 Labs (2/14): K 4.9, creatinine 1.4, LDL 61, HDL 29 Labs (1/16): LDL 79, HDL 25 Labs (8/16): LDL 65, HDL 37 Labs (9/16): K 5.1, creatinine 1.27, HCT 40.6 Labs (11/17): K 4.1, creatinine 1.23, LDL 56, HDL 32 Labs (12/17): K 4.7, creatinine 1.19, ALT 15  Past Medical History: 1. 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  s/p back surgery.  2. Diabetes mellitus 3.  Hypertension: had side effects with chlorthalidone.  4. Hyperlipidemia: intolerant to multiple statins due to muscle pain.  He is able to tolerate lovastatin and pravastatin.  5. CAD: Anterior MI in 2004.  Patient had LAD and RCA PCI at the time.  Last myoview was in 7/07: EF 62%, small area of anteroapical infarct.  Echo (3/11): EF 60%, normal wall motion, mild MR, mild left atrial enlargement, mildly dilated ascending aorta.   Myoview (1/13): Small fixed apical septal defect with no ischemia, EF 58%. He had recurrent NSTEMI in 1/16.  LHC showed severe in-stent restenosis in the mLAD and severe disease in the mid to distal LCx.  He had PCI with DES to mLAD and DES to mid and distal LCx. Recurrent CP so had Cardiolite in 4/16 with EF 61%, anterolateral ischemia.  LHC (4/16) with patent LAD and LCx stents; there was an 80-90% ostial D1 stenosis, this appeared to be jailed by LAD stent.  Medical treatment. Echo (4/16) with EF 60-65%, mildly dilated ascending aorta at 4.1 cm, moderate TR.  - Lexiscan Cardiolite (9/17): EF 55%, fixed anteroseptal defect, no ischemia.  6.  Arterial dopplers (3/11): no evidence for significant PAD. ABIs 10/12: Normal.  ABIs normal in 10/16.  7.  Allergic rhinitis 8.  Abdominal discomfort: EGD 2011 showed no significant abnormalities.  Possibly due to metformin.  9.  C-spine arthritis 10. 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  Carotid dopplers in 1/13 showed minimal disease.  11. Atrial fibrillation: Paroxysmal.  1st noted in 11/17.    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  Tobacco Use - No.  Alcohol Use - no Drug Use - no  ROS: All systems reviewed and negative except as per HPI.   Current Outpatient Prescriptions  Medication Sig Dispense Refill  . acetaminophen (TYLENOL) 650 MG CR tablet Take 1,300 mg by mouth every 8 (eight) hours as needed for pain.     [name] amLODipine (NORVASC) 5 MG tablet Take 1 tablet (5 mg total) by mouth daily. 30 tablet 6  . apixaban (ELIQUIS) 5 MG TABS tablet Take 1 tablet (5 mg total) by mouth 2 (two) times daily. 180 tablet 3  . atorvastatin (LIPITOR) 10 MG tablet Take 1 tablet (10 mg total) by mouth daily. 90 tablet 3  . Coenzyme Q10 (CO Q 10 PO) Take 1 tablet by mouth daily.     . fluticasone (FLONASE) 50 MCG/ACT nasal spray Place 2 sprays into both nostrils daily as needed for allergies or rhinitis.   0  . glipiZIDE (GLUCOTROL) 10 MG tablet Take 10 mg by mouth 2 (two) times daily before a meal.      . Insulin Detemir (LEVEMIR FLEXTOUCH) 100 UNIT/ML Pen Inject 20 Units into the skin 2 (two) times daily before a meal.    . isosorbide mononitrate (IMDUR) 60 MG 24 hr tablet Take 1.5 tablets (90mg ) by mouth daily 135 tablet 1  . losartan (COZAAR) 50 MG tablet Take 50 mg by mouth daily.    . metoprolol (LOPRESSOR) 25 MG tablet Take 0.5 tablets (12.5 mg total) by mouth 2 (two) times daily. 30 tablet 11  . nitroGLYCERIN (NITROSTAT) 0.4 MG SL tablet Place 0.4 mg under the tongue every 5 (five) minutes as needed for chest pain (x 3 doses).     . Omega-3 Fatty Acids (FISH OIL) 1000 MG CAPS Take 4 capsules (4,000 mg total) by mouth daily. 120 capsule 11   No current facility-administered medications for this visit.     BP 120/64   Pulse 68   Ht 6\' 1"  (1.854 m)   Wt 213 lb 1.9 oz (96.7 kg)   SpO2 98%   BMI 28.12 kg/m  General: NAD Neck: Supple without LAD or TM.  No JVD, HJR, or carotid bruit.  Lungs: Clear to auscultation bilaterally with normal respiratory effort. CV: RRR without m/r/g.  Non-displaced PMI.   Abdomen: Soft, nontender, no hepatosplenomegaly, no distention.  Neurologic: Alert and oriented x 3.  Psych: Normal affect. Extremities: No clubbing or cyanosis.   Assessment/Plan:  Atrial fibrillation Heart sounds regular today; EKG at last visit demonstrated NSR.  Patient tolerating apixaban well, though he  is concerned about cost going forward.  He have provided him with samples today.  I advised him to speak with his insurance about what his cost will be once he reaches his deductable or if there is another preferred agent (such as rivaroxaban).  He is hesitant to try warfarin due to monitoring.  CAD  NSTEMI with DES x 3 in 1/16 to mLAD and m/dLCx.  Repeat cath was done in 4/16 after Cardiolite showed anterolateral ischemia.  Cath showed a D1 with 80-90% ostial stenosis, it was jailed by the stent.  This is the likely source of the Cardiolite defect and his chronic stable angina. Cardiolite in 9/17 showed  no ischemia.  He is without chest pain and shortness of breath.  Clopidogrel d/c'ed after last visit, as patient is now on apixaban.  We will continue his current medications, including isosorbide mononitrate and metoprolol.  HYPERTENSION  Blood pressue is well-controlled today.  We will continue his current regimen.  If he continues to have back rash that was attributed to lisinopril, dermatology consultation should be considered.  HYPERCHOLESTEROLEMIA Continue atorvastatin; LDL 56 in 05/2016.  Follow-up: Return to clinic in 4 months.  Demitri Kucinski 08/19/2016

## 2016-08-19 NOTE — Patient Instructions (Signed)
Medication Instructions:  .Your physician recommends that you continue on your current medications as directed. Please refer to the Current Medication list given to you today.   Labwork: None   Testing/Procedures: None  Follow-Up: Your physician recommends that you schedule a follow-up appointment in: 4 months with Dr End. (June 2018)        If you need a refill on your cardiac medications before your next appointment, please call your pharmacy.   

## 2016-08-20 ENCOUNTER — Encounter: Payer: Self-pay | Admitting: Internal Medicine

## 2016-09-01 ENCOUNTER — Telehealth: Payer: Self-pay

## 2016-09-01 NOTE — Telephone Encounter (Signed)
Patient has been approved for Eliquis assistance through [employer].

## 2016-10-27 ENCOUNTER — Telehealth: Payer: Self-pay | Admitting: Internal Medicine

## 2016-10-27 NOTE — Telephone Encounter (Signed)
Pt c/o medication issue:  1. Name of Medication: avostatain    2. How are you currently taking this medication (dosage and times per day)? 1xday  3. Are you having a reaction (difficulty breathing--STAT)? no 4. What is your medication issue? Joint pain

## 2016-10-28 NOTE — Telephone Encounter (Signed)
I discussed Dr Serita Kyle recommendations with pt, he verbalized understanding and agreed with plan.   Pt will call me in a month and let me know if pain improves off atorvastatin.

## 2016-10-28 NOTE — Telephone Encounter (Signed)
Please have Joseph [name] hold his atorvastatin for a month. If his pain improves, we will try switching him to rosuvastatin. If his arthralgias are unchanged, they are unlikely to be related to atorvastatin and we will have him restart this medication.

## 2016-10-28 NOTE — Telephone Encounter (Signed)
Pt states he has had pain in most his joints, particularly his hip, for more than 1 year. Pt states joint pain has progressively gotten worse, he feels this is related to atorvastatin. Pt does not feel he can tolerate atorvastatin and is asking for option to take instead of atorvastatin. Pt states Crestor 5 mg was recommended a couple of years ago but it was too expensive at the time. Pt is willing to try rosuvastatin if that is an option.  Pt advised to hold atorvastatin for now, I will forward to Dr End for review and recommendations, then follow up with him.

## 2017-01-13 ENCOUNTER — Encounter: Payer: Self-pay | Admitting: Internal Medicine

## 2017-01-13 ENCOUNTER — Ambulatory Visit (INDEPENDENT_AMBULATORY_CARE_PROVIDER_SITE_OTHER): Payer: Medicare Other | Admitting: Internal Medicine

## 2017-01-13 VITALS — BP 152/82 | HR 64 | Ht 74.0 in | Wt 213.0 lb

## 2017-01-13 DIAGNOSIS — E7849 Other hyperlipidemia: Secondary | ICD-10-CM

## 2017-01-13 DIAGNOSIS — E784 Other hyperlipidemia: Secondary | ICD-10-CM

## 2017-01-13 DIAGNOSIS — I480 Paroxysmal atrial fibrillation: Secondary | ICD-10-CM | POA: Diagnosis not present

## 2017-01-13 DIAGNOSIS — I10 Essential (primary) hypertension: Secondary | ICD-10-CM | POA: Diagnosis not present

## 2017-01-13 DIAGNOSIS — I25118 Atherosclerotic heart disease of native coronary artery with other forms of angina pectoris: Secondary | ICD-10-CM

## 2017-01-13 MED ORDER — ROSUVASTATIN CALCIUM 5 MG PO TABS: ORAL_TABLET | ORAL | 6 refills | Status: DC

## 2017-01-13 MED ORDER — LOSARTAN POTASSIUM 100 MG PO TABS: 100.0000 mg | ORAL_TABLET | Freq: Every day | ORAL | 6 refills | Status: DC

## 2017-01-13 NOTE — Progress Notes (Signed)
Patient ID: Joseph [name], male   DOB: [birth year], 76 y.o.   MRN: [id number] PCP: Joseph [name] ([place])  76 y.o. man with history of CAD s/p anterior MI in 2004 and LAD/RCA PCIs as well as NSTEMI in 1/16 with mLAD and m/dLCx PCIs  presents for cardiology followup.   He had recurrent NSTEMI in 1/16.  LHC showed severe in-stent restenosis in the mLAD and severe disease in the mid to distal LCx.  He had PCI with DES to mLAD and DES to mid and distal LCx.  He had recurrent chest pain in 4/16 with Cardiolite showing anterolateral ischemia.  Repeat cath showed patent stents but jailed D1 with 80-90% ostial stenosis.  This was managed medically.  Cardiolite in 9/17 showed fixed anteroseptal defect, no ischemia.   Since her last visit on 08/19/16, Mr. Joseph [name] has done relatively well. He noted progressive myalgias prompting [place] to discontinue atorvastatin. He notes that over the last 2 months, his myalgias have gradually resolved. He reports a few episodes of "mild" chest pain that lasts a few seconds. It typically occurs at rest and resolves when he gets up and moves around. He has not had any shortness of breath, orthopnea, PND, palpitations, or lightheadedness. He is taking his isosorbide mononitrate in split doses (60 mg twice a day), which seems to be controlling his angina well. He still has itching on his back, which she previously attributed to lisinopril. He is applying a steroid cream but has not seen a dermatologist. Rash has not spread and seems to be stable. He remains on apixaban without significant bleeding.  Labs (3/11): LDL 90, HDL 40, K 4.4, creatinine 1.0 Labs (4/11): K 5.1, creatinine 1.4 Labs (9/11): K 4.5, creatinine 0.97, LDL 83, HDL 35 Labs (3/12): HDL 29, LDL 74, K 4.6, creatinine 1.1 Labs (9/12): LDL 60, HDL 36 Labs (1/13): Cardiac enzymes negative, K 5.3, creatinine 1.28 Labs (2/14): K 4.9, creatinine 1.4, LDL 61, HDL 29 Labs (1/16): LDL 79, HDL 25 Labs (8/16): LDL 65, HDL 37 Labs  (9/16): K 5.1, creatinine 1.27, HCT 40.6 Labs (11/17): K 4.1, creatinine 1.23, LDL 56, HDL 32 Labs (12/17): K 4.7, creatinine 1.19, ALT 15 Labs (12/15/16): K 5.0, creatinine 1.2, ALT 17, LDL 95, triglyceride 155, TSH 1.3  Past Medical History: 1. 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  s/p back surgery.  2. Diabetes mellitus 3. Hypertension: had side effects with chlorthalidone.  4. Hyperlipidemia: intolerant to multiple statins due to muscle pain.  He is able to tolerate lovastatin and pravastatin.  5. CAD: Anterior MI in 2004.  Patient had LAD and RCA PCI at the time.  Last myoview was in 7/07: EF 62%, small area of anteroapical infarct.  Echo (3/11): EF 60%, normal wall motion, mild MR, mild left atrial enlargement, mildly dilated ascending aorta.   Myoview (1/13): Small fixed apical septal defect with no ischemia, EF 58%. He had recurrent NSTEMI in 1/16.  LHC showed severe in-stent restenosis in the mLAD and severe disease in the mid to distal LCx.  He had PCI with DES to mLAD and DES to mid and distal LCx. Recurrent CP so had Cardiolite in 4/16 with EF 61%, anterolateral ischemia.  LHC (4/16) with patent LAD and LCx stents; there was an 80-90% ostial D1 stenosis, this appeared to be jailed by LAD stent.  Medical treatment. Echo (4/16) with EF 60-65%, mildly dilated ascending aorta at 4.1 cm, moderate TR.  - Lexiscan Cardiolite (9/17): EF 55%,  fixed anteroseptal defect, no ischemia.  6.  Arterial dopplers (3/11): no evidence for significant PAD. ABIs 10/12: Normal.  ABIs normal in 10/16.  7.  Allergic rhinitis 8.  Abdominal discomfort: EGD 2011 showed no significant abnormalities.  Possibly due to metformin.  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  Carotid dopplers in 1/13 showed minimal disease.  11. Atrial fibrillation: Paroxysmal.  1st noted in 11/17.    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  Tobacco Use - No.  Alcohol Use - no Drug Use - no  ROS: All systems reviewed and negative except as per HPI.   Current Outpatient Prescriptions  Medication Sig Dispense Refill  . acetaminophen (TYLENOL) 650 MG CR tablet Take 1,300 mg by mouth every 8 (eight) hours as needed for pain.    [name]. amLODipine (NORVASC) 5 MG tablet Take 1 tablet (5 mg total) by mouth daily. 30 tablet 6  . apixaban (ELIQUIS) 5 MG TABS tablet Take 1 tablet (5 mg total) by mouth 2 (two) times daily. 180 tablet 3  . Coenzyme Q10 (CO Q 10 [name]) Take 1 tablet by mouth daily.     . fluticasone (FLONASE) 50 MCG/ACT nasal spray Place 2 sprays into both nostrils daily as needed for allergies or rhinitis.   0  . glipiZIDE (GLUCOTROL) 10 MG tablet Take 10 mg by mouth 2 (two) times daily before a meal.      . Insulin Detemir (LEVEMIR FLEXTOUCH) 100 UNIT/ML Pen Inject 20 Units into the skin 2 (two) times daily before a meal.    . isosorbide mononitrate (IMDUR) 60 MG 24 hr tablet Take 60 mg by mouth 2 (two) times daily.    [name]. losartan (COZAAR) 50 MG tablet Take 50 mg by mouth daily.    . metoprolol (LOPRESSOR) 25 MG tablet Take 0.5 tablets (12.5 mg total) by mouth 2 (two) times daily. 30 tablet 11  . nitroGLYCERIN (NITROSTAT) 0.4 MG SL tablet Place 0.4 mg under the tongue every 5 (five) minutes as needed for chest pain (x 3 doses).     . Omega-3 Fatty Acids (FISH OIL) 1000 MG CAPS Take 4 capsules (4,000 mg total) by mouth daily. 120 capsule 11  . atorvastatin (LIPITOR) 10 MG tablet ON HOLD 10/28/16     No current facility-administered medications for this visit.     BP (!) 152/82   Pulse 64   Ht 6\' 2"  (1.88 m)   Wt 213 lb (96.6 kg)   BMI 27.35 kg/m  General:  Well-developed, well-nourished man, seated comfortably in the exam room. HEENT: No conjunctival pallor or scleral icterus.  Moist mucous membranes.  OP clear. Neck: Supple without lymphadenopathy,  thyromegaly, JVD, or HJR.  No carotid bruit. Lungs: Normal work of breathing.  Clear to auscultation bilaterally without wheezes or crackles. Heart: Regular rate and rhythm without murmurs, rubs, or gallops.  Non-displaced PMI. Abd: Bowel sounds present.  Soft, NT/ND without hepatosplenomegaly Ext: No lower extremity edema.  Radial, PT, and DP pulses are 2+ bilaterally. Skin: Warm and dry without rash.   Assessment/Plan:  Atrial fibrillation No symptoms of recurrent atrial fibrillation on exam. Heart sounds are regular today. We will continue with current medications, including indefinite anticoagulation with apixaban.  CAD  Symptoms consistent with stable angina. Some of his chest pain is atypical and could also be noncardiac. We will not make  any medication changes today.  HYPERTENSION  Blood pressure mildly elevated today. We will increase losartan to 100 mg daily and recheck a basic metabolic panel and blood pressure in about 2 weeks.  HYPERCHOLESTEROLEMIA Myalgias have resolved with statin holiday. However, LDL is now above our goal (less than 70). We have agreed to start rosuvastatin 5 mg Monday, Wednesday, and Friday. If he is intolerant of this, he will need to be referred to the lipid clinic to discuss PCSK9 inhibitor therapy. We will repeat a lipid panel prior to follow-up in 3 months.  Follow-up: Return to clinic in 3 months.  Stefania Goulart 01/13/2017

## 2017-01-13 NOTE — Patient Instructions (Addendum)
Medication Instructions:  Your physician has recommended you make the following change in your medication- Increase losartan to 100 mg by mouth daily. Stop Atorvastatin  Start Rosuvastatin 5 mg by mouth on Monday, Wednesday and Friday.   Labwork: Your physician recommends that you return for lab work in: 2 weeks. (BMP) Your physician recommends that you return for lab work in:  About 3 months for Lipid and ALT  (week or so prior to appointment with Dr. Okey DupreEnd).  This will be fasting    Testing/Procedures: none  Follow-Up: Your physician recommends that you schedule a follow-up appointment in: 2 weeks with pharmacist for BP check.  Lab work to be done same day(BMP)  Your physician recommends that you schedule a follow-up appointment in: 3 months with Dr. Okey DupreEnd     Any Other Special Instructions Will Be Listed Below (If Applicable).     If you need a refill on your cardiac medications before your next appointment, please call your pharmacy.

## 2017-01-25 ENCOUNTER — Ambulatory Visit (INDEPENDENT_AMBULATORY_CARE_PROVIDER_SITE_OTHER): Payer: Medicare Other | Admitting: Pharmacist

## 2017-01-25 ENCOUNTER — Other Ambulatory Visit: Payer: Medicare Other

## 2017-01-25 ENCOUNTER — Telehealth: Payer: Self-pay | Admitting: Pharmacist

## 2017-01-25 VITALS — BP 118/64 | HR 68

## 2017-01-25 DIAGNOSIS — I10 Essential (primary) hypertension: Secondary | ICD-10-CM

## 2017-01-25 DIAGNOSIS — I2510 Atherosclerotic heart disease of native coronary artery without angina pectoris: Secondary | ICD-10-CM

## 2017-01-25 DIAGNOSIS — E7800 Pure hypercholesterolemia, unspecified: Secondary | ICD-10-CM

## 2017-01-25 NOTE — Progress Notes (Signed)
Patient ID: Joseph [name]                 DOB: 04-06-1941                      MRN: [id number]     HPI: Joseph [name] is a 76 y.o. male referred by Dr. Okey Dupre to HTN clinic. PMH is significant for CAD s/p anterior MI in 2004, PCIs and NSTEMI in 2016, DM, HTN, HLD, PAF, and CVA. At last visit with Dr End 2 weeks ago, BP was elevated at 152/82 and losartan was increased to 100mg  daily. He presents today for further HTN management and BMET.  Pt reports feeling well overall. He reports adherence with his medication and denies dizziness, blurred vision, or headache. He did have one fall but states he tripped on something, it was not a balance issue. He has checked his BP a few times at home since increasing his losartan and recalls a reading of 113/60 and a highest reading of 134/82. He walks every day and eats a low sodium diet.  Also inquired if pt was tolerating his low dose rosuvastatin 3x per week which was prescribed by Dr End 2 weeks ago. Pt states he never picked this up because his pharmacy didn't have it. Called pt's pharmacy and they stated his [facility] plan would not cover it. Called pt's insurance because it seems strange for his insurance to not cover a generic statin and they confirmed that neither Crestor nor rosuvastatin are on pt's formulary. I expressed to them that pt has tried previous statins in the past including Lipitor, Zocor, and lovastatin and did not tolerate them secondary to myalgias. The representative stated we could submit a prior authorization.   Current HTN meds: amlodipine 5mg  daily, Imdur 60mg  BID, losartan 100mg  daily, metoprolol 12.5mg  BID Previously tried: lisinopril - itching BP goal: <130/64mmHg  Family History: Mother and father have both had heart attacks.  Social History: Denies tobacco, alcohol, or illicit drug use.  Diet: Eats a banana and bowl of cereal each morning. Salad and a meat at lunch. Doesn't eat much in the evening. Drinks water,  milk, and decaf coffee and soda  Exercise: Walks every day for 20 minutes (1 mile) - does have some chest pain when he starts to walk but it always disappears after   Wt Readings from Last 3 Encounters:  01/13/17 213 lb (96.6 kg)  08/19/16 213 lb 1.9 oz (96.7 kg)  07/02/16 214 lb 1.9 oz (97.1 kg)   BP Readings from Last 3 Encounters:  01/13/17 (!) 152/82  08/19/16 120/64  07/02/16 (!) 150/80   Pulse Readings from Last 3 Encounters:  01/13/17 64  08/19/16 68  07/02/16 75    Renal function: CrCl cannot be calculated (Patient's most recent lab result is older than the maximum 21 days allowed.).  Past Medical History:  Diagnosis Date  . Abdominal discomfort    Abdominal discomfort: EGD 2011 showed no significant abnormalities.  Possibly due to metformin.  . Allergic rhinitis   . Anticoagulated 05/30/2016  . CAD (coronary artery disease)    a. s/p MI in 2004:  stents to the LAD and staged stent RCA, EF 55%;  b.  NSTEMI (1/16):  LHC - mid LAD stent with diff dist restenosis, prox to mid Dx mod dsz jailed by stent, small OM1 99, mCFX 75, dCFX 80, mRCA stent ok, mPDA mild to mod dsz, EF 55% >> PCI:  Synergy DES to LAD; Synergy DES (x2) mid and dist CFX  . 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  s/p back surgery  . DM (diabetes mellitus) (HCC)   . History of CVA (cerebrovascular accident)    Small CVA seen by head CT (incidental) in 1/13.  Carotid dopplers in 1/13 showed minimal disease  . History of Doppler ultrasound    Arterial dopplers (3/11): no evidence for significant PAD. ABIs 10/12: Normal.    . HLD (hyperlipidemia)   . HTN (hypertension)    had side effects with chlorthalidone  . Hx of cardiovascular stress test    Myoview (1/13): Small fixed apical septal defect with no ischemia, EF 58%.  [name] Hx of echocardiogram    Echo (3/11): EF 60%, normal wall motion, mild MR, mild left atrial enlargement, mildly dilated  ascending aorta.  [name] PAF (paroxysmal atrial fibrillation) (HCC) 05/29/2016  . Spondylolisthesis    L4-S1    Current Outpatient Prescriptions on File Prior to Visit  Medication Sig Dispense Refill  . acetaminophen (TYLENOL) 650 MG CR tablet Take 1,300 mg by mouth every 8 (eight) hours as needed for pain.    [name] amLODipine (NORVASC) 5 MG tablet Take 1 tablet (5 mg total) by mouth daily. 30 tablet 6  . apixaban (ELIQUIS) 5 MG TABS tablet Take 1 tablet (5 mg total) by mouth 2 (two) times daily. 180 tablet 3  . Coenzyme Q10 (CO Q 10 PO) Take 1 tablet by mouth daily.     . fluticasone (FLONASE) 50 MCG/ACT nasal spray Place 2 sprays into both nostrils daily as needed for allergies or rhinitis.   0  . glipiZIDE (GLUCOTROL) 10 MG tablet Take 10 mg by mouth 2 (two) times daily before a meal.      . Insulin Detemir (LEVEMIR FLEXTOUCH) 100 UNIT/ML Pen Inject 20 Units into the skin 2 (two) times daily before a meal.    . isosorbide mononitrate (IMDUR) 60 MG 24 hr tablet Take 60 mg by mouth 2 (two) times daily.    [name] losartan (COZAAR) 100 MG tablet Take 1 tablet (100 mg total) by mouth daily. 30 tablet 6  . metoprolol (LOPRESSOR) 25 MG tablet Take 0.5 tablets (12.5 mg total) by mouth 2 (two) times daily. 30 tablet 11  . nitroGLYCERIN (NITROSTAT) 0.4 MG SL tablet Place 0.4 mg under the tongue every 5 (five) minutes as needed for chest pain (x 3 doses).     . Omega-3 Fatty Acids (FISH OIL) 1000 MG CAPS Take 4 capsules (4,000 mg total) by mouth daily. 120 capsule 11  . rosuvastatin (CRESTOR) 5 MG tablet Take one tablet by mouth on Mon, Wed and Fri 15 tablet 6   No current facility-administered medications on file prior to visit.     No Known Allergies   Assessment/Plan:  1. Hypertension - BP much improved and now at goal <130/81mmHg since increasing losartan. Will continue losartan 100mg  daily, metoprolol tartrate 12.5mg  BID, amlodipine 5mg  daily, and Imdur 60mg  BID. Advised pt to continue to monitor his BP  at home and to call clinic if BP readings become consistently elevated. BMET check today with recent dose increase of losartan. F/u in HTN clinic as needed.  2. Hyperlipidemia - Pt was unable to start low dose rosuvastatin 5mg  TIW as prescribed by Dr End at last visit. I spoke with his pharmacy who stated that his insurance did not cover rosuvastatin. I then reached out  to his insurance since it is unusual for insurance not to cover a generic. They confirmed that neither rosuvastatin nor Crestor are on patient's formulary. Will submit a formulary exception request for rosuvastatin since pt is previously intolerant to Lipitor, Zocor, and lovastatin, and rosuvastatin is generally tolerated better.   Joseph [name] E. Supple, PharmD, CPP, BCACP  [facility] [address], [place], [place] [address] Phone: [phone]; Fax: [phone] 01/25/2017 2:21 PM

## 2017-01-25 NOTE — Telephone Encounter (Signed)
Formulary exception approved for rosuvastatin. Pharmacy reprocessed prescription and copay will be < $15 per month. Called pt and he states this is affordable and he will pick up rx.

## 2017-01-25 NOTE — Patient Instructions (Addendum)
Your blood pressure was excellent today.   Continue taking your current medications.  I will submit a prior authorization to your insurance company to see if they will cover rosuvastatin for your cholesterol

## 2017-01-26 LAB — BASIC METABOLIC PANEL
BUN/Creatinine Ratio: 19 (ref 10–24)
BUN: 26 mg/dL (ref 8–27)
CALCIUM: 10 mg/dL (ref 8.6–10.2)
CHLORIDE: 102 mmol/L (ref 96–106)
CO2: 23 mmol/L (ref 20–29)
Creatinine, Ser: 1.35 mg/dL — ABNORMAL HIGH (ref 0.76–1.27)
GFR, EST AFRICAN AMERICAN: 58 mL/min/{1.73_m2} — AB (ref >59)
GFR, EST NON AFRICAN AMERICAN: 50 mL/min/{1.73_m2} — AB (ref >59)
Glucose: 216 mg/dL — ABNORMAL HIGH (ref 65–99)
Potassium: 5 mmol/L (ref 3.5–5.2)
Sodium: 141 mmol/L (ref 134–144)

## 2017-04-04 ENCOUNTER — Other Ambulatory Visit: Payer: Medicare Other | Admitting: *Deleted

## 2017-04-04 DIAGNOSIS — E7849 Other hyperlipidemia: Secondary | ICD-10-CM

## 2017-04-04 LAB — LIPID PANEL
CHOL/HDL RATIO: 3.4 ratio (ref 0.0–5.0)
Cholesterol, Total: 128 mg/dL (ref 100–199)
HDL: 38 mg/dL — AB (ref >39)
LDL CALC: 65 mg/dL (ref 0–99)
TRIGLYCERIDES: 126 mg/dL (ref 0–149)
VLDL Cholesterol Cal: 25 mg/dL (ref 5–40)

## 2017-04-04 LAB — ALT: ALT: 15 IU/L (ref 0–44)

## 2017-04-06 ENCOUNTER — Telehealth: Payer: Self-pay | Admitting: *Deleted

## 2017-04-06 NOTE — Telephone Encounter (Signed)
Pt will stop rosuvastatin, will ask [facility] to contact him about Lipid Clinic appointment.

## 2017-04-11 ENCOUNTER — Ambulatory Visit (INDEPENDENT_AMBULATORY_CARE_PROVIDER_SITE_OTHER): Payer: Medicare Other | Admitting: Internal Medicine

## 2017-04-11 ENCOUNTER — Encounter (INDEPENDENT_AMBULATORY_CARE_PROVIDER_SITE_OTHER): Payer: Self-pay

## 2017-04-11 ENCOUNTER — Encounter: Payer: Self-pay | Admitting: Internal Medicine

## 2017-04-11 VITALS — BP 134/72 | HR 60 | Ht 74.0 in | Wt 214.8 lb

## 2017-04-11 DIAGNOSIS — I480 Paroxysmal atrial fibrillation: Secondary | ICD-10-CM

## 2017-04-11 DIAGNOSIS — E785 Hyperlipidemia, unspecified: Secondary | ICD-10-CM | POA: Diagnosis not present

## 2017-04-11 DIAGNOSIS — I10 Essential (primary) hypertension: Secondary | ICD-10-CM

## 2017-04-11 DIAGNOSIS — I25118 Atherosclerotic heart disease of native coronary artery with other forms of angina pectoris: Secondary | ICD-10-CM | POA: Diagnosis not present

## 2017-04-11 NOTE — Patient Instructions (Signed)
Medication Instructions:  Your physician recommends that you continue on your current medications as directed. Please refer to the Current Medication list given to you today.   Labwork: None   Testing/Procedures: None   Follow-Up: You have an appointment with the pharmacist is October 5,2018 at 8:30 AM.   Your physician wants you to follow-up in: 6 months with Dr End. (March 2019).  You will receive a reminder letter in the mail two months in advance. If you don't receive a letter, please call our office to schedule the follow-up appointment.        If you need a refill on your cardiac medications before your next appointment, please call your pharmacy.

## 2017-04-11 NOTE — Progress Notes (Signed)
Follow-up Outpatient Visit Date: 04/11/2017  Primary Care Provider: Dema Severin, NP [address] [facility] [place] [address]  Chief Complaint: Fatigue and muscle pain  HPI:  Joseph [name] is a 76 y.o. year-old male with history of coronary artery disease status post PCI's 2004 and 2016, paroxysmal atrial fibrillation, hypertension, hyperlipidemia, diabetes mellitus, and stroke who presents for follow-up of coronary artery disease. I last saw him in June, at which time he reported improved myalgias after discontinuing atorvastatin. He noted occasional episodes of atypical chest pain that had been well-controlled with twice a day dosing of isosorbide mononitrate. We agreed to start rosuvastatin 5 mg on Monday, Wednesday, and Friday, which has been controlling his cholesterol reasonably well with an LDL 65 week. However, he stopped taking rosuvastatin about a week ago due to myalgias and increasing fatigue. His symptoms had previously resolved after a statin holiday. He is scheduled to be seen in clinic to discuss alternative therapies next month.  Otherwise, Joseph [name] has felt well. Since taking isosorbide mononitrate 120 mg every morning rather than twice daily, his chest pain has resolved. He denies dyspnea, palpitations, lightheadedness, orthopnea, and edema. He is tolerating apixaban well, but has not received his his recent shipment and will be running out in a few days.  Joseph [name] has not been checking his blood pressure regularly at home but notes that it was elevated yesterday at 148/78.  -------------------------------------------------------------------------------------------------- Past Medical History:  Diagnosis Date  . Abdominal discomfort    Abdominal discomfort: EGD 2011 showed no significant abnormalities.  Possibly due to metformin.  . Allergic rhinitis   . Anticoagulated 05/30/2016  . CAD (coronary artery disease)    a. s/p MI in 2004:  stents to the LAD and staged stent RCA, EF  55%;  b.  NSTEMI (1/16):  LHC - mid LAD stent with diff dist restenosis, prox to mid Dx mod dsz jailed by stent, small OM1 99, mCFX 75, dCFX 80, mRCA stent ok, mPDA mild to mod dsz, EF 55% >> PCI:  Synergy DES to LAD; Synergy DES (x2) mid and dist CFX  . 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  s/p back surgery  . DM (diabetes mellitus) (HCC)   . History of CVA (cerebrovascular accident)    Small CVA seen by head CT (incidental) in 1/13.  Carotid dopplers in 1/13 showed minimal disease  . History of Doppler ultrasound    Arterial dopplers (3/11): no evidence for significant PAD. ABIs 10/12: Normal.    . HLD (hyperlipidemia)   . HTN (hypertension)    had side effects with chlorthalidone  . Hx of cardiovascular stress test    Myoview (1/13): Small fixed apical septal defect with no ischemia, EF 58%.  [name] Hx of echocardiogram    Echo (3/11): EF 60%, normal wall motion, mild MR, mild left atrial enlargement, mildly dilated ascending aorta.  [name] PAF (paroxysmal atrial fibrillation) (HCC) 05/29/2016  . Spondylolisthesis    L4-S1   Past Surgical History:  Procedure Laterality Date  . CORONARY ANGIOGRAM  11/13/2014   Procedure: CORONARY ANGIOGRAM;  Surgeon: Laurey Morale, MD;  Location: [facility] CATH LAB;  Service: Cardiovascular;;  . CORONARY STENT PLACEMENT  2004   x2 LAD and RCA   . LEFT HEART CATHETERIZATION WITH CORONARY ANGIOGRAM N/A 08/02/2014   Procedure: LEFT HEART CATHETERIZATION WITH CORONARY ANGIOGRAM;  Surgeon: Corky Crafts, MD;  Location: [facility] CATH LAB;  Service: Cardiovascular;  Laterality: N/A;  . PROSTATE SURGERY    . SPINAL FUSION     L4-S1    Current Meds  Medication Sig  . isosorbide mononitrate (IMDUR) 60 MG 24 hr tablet Take 120 mg by mouth daily.    Allergies: Rosuvastatin  Social History   Social History  . Marital status: Single    Spouse name: N/A  . Number of children: 2  . Years of education:  N/A   Occupational History  . retired    Social History Main Topics  . Smoking status: Never Smoker  . Smokeless tobacco: Never Used  . Alcohol use No  . Drug use: No  . Sexual activity: Not on file   Other Topics Concern  . Not on file   Social History Narrative  . No narrative on file    Family History  Problem Relation Age of Onset  . Heart attack Mother   . Heart attack Father   . Stroke Neg Hx     Review of Systems: A 12-system review of systems was performed and was negative except as noted in the HPI.  --------------------------------------------------------------------------------------------------  Physical Exam: BP 134/72   Pulse 60   Ht  (1.88 m)   Wt 214 lb 12.8 oz (97.4 kg)   SpO2 98%   BMI 27.58 kg/m   General:  Overweight man, seated comfortably in the exam room. HEENT: No conjunctival pallor or scleral icterus. Moist mucous membranes.  OP clear. Neck: Supple without lymphadenopathy, thyromegaly, JVD, or HJR. Lungs: Normal work of breathing. Clear to auscultation bilaterally without wheezes or crackles. Heart: Regular rate and rhythm without murmurs, rubs, or gallops. Non-displaced PMI. Abd: Bowel sounds present. Soft, NT/ND without hepatosplenomegaly Ext: Trace ankle edema bilaterally. Radial, PT, and DP pulses are 2+ bilaterally. Skin: Warm and dry without rash.  EKG:  Sinus rhythm with first-degree AV block and poor R-wave progression in V1 and V2.  Lab Results  Component Value Date   WBC 10.1 05/30/2016   HGB 13.5 05/30/2016   HCT 38.2 (L) 05/30/2016   MCV 88.0 05/30/2016   PLT 208 05/30/2016    Lab Results  Component Value Date   NA 141 01/25/2017   K 5.0 01/25/2017   CL 102 01/25/2017   CO2 23 01/25/2017   BUN 26 01/25/2017   CREATININE 1.35 (H) 01/25/2017   GLUCOSE 216 (H) 01/25/2017   ALT 15 04/04/2017    Lab Results  Component Value Date   CHOL 128 04/04/2017   HDL 38 (L) 04/04/2017   LDLCALC 65 04/04/2017    LDLDIRECT 83.0 10/28/2014   TRIG 126 04/04/2017   CHOLHDL 3.4 04/04/2017    --------------------------------------------------------------------------------------------------  ASSESSMENT AND PLAN: Coronary artery disease with stable angina Chest pain is now well controlled with isosorbide mononitrate 120 mg every morning, amlodipine 5 mg daily, and metoprolol tartrate 12.5 mg twice a day. We will continue this regimen indefinitely. I encouraged Joseph [name] to remain active.  Paroxysmal atrial fibrillation Joseph [name] remains asymptomatic. Heart rate is low normal today on low-dose metoprolol. He is tolerating apixaban well, which we will continue indefinitely.  Hypertension Blood pressure is borderline elevated today. We will defer medication changes at this time.  Hyperlipidemia Joseph [name] has been intolerant to multiple statins, including rosuvastatin 5 mg three days a week. Given his history of CAD, he certainly warrants aggressive lipid management. He is scheduled to be seen in the lipid clinic next month to discuss research studies and/or initiation of the PCSK9  inhibitor.  Follow-up: Return to clinic in 6 months.  Yvonne Kendall, MD 04/11/2017 9:56 AM

## 2017-04-12 NOTE — Addendum Note (Signed)
Addended by: Micki Riley C on: 04/12/2017 09:02 AM   Modules accepted: Orders

## 2017-04-22 ENCOUNTER — Ambulatory Visit (INDEPENDENT_AMBULATORY_CARE_PROVIDER_SITE_OTHER): Payer: Medicare Other | Admitting: Pharmacist

## 2017-04-22 DIAGNOSIS — E782 Mixed hyperlipidemia: Secondary | ICD-10-CM | POA: Diagnosis not present

## 2017-04-22 MED ORDER — EZETIMIBE 10 MG PO TABS: 10.0000 mg | ORAL_TABLET | Freq: Every day | ORAL | 11 refills | Status: DC

## 2017-04-22 NOTE — Patient Instructions (Signed)
It was nice to meet you today  Start taking Zetia (ezetimibe)  once a day for your cholesterol  Recheck cholesterol on Monday, January 14th. Come in any time after 7:30am for fasting lab work  If you have problems tolerating the Zetia, call Megan in the lipid clinic #[phone] and we can pursue the cholesterol injections

## 2017-04-22 NOTE — Progress Notes (Signed)
Patient ID: [name]                 DOB: 12/10/1940                    MRN: [id number]     HPI: Joseph [name] is a 76 y.o. male patient referred to lipid clinic by Dr End. PMH is significant for CAD s/p anterior MI in 2004, PCIs and NSTEMI in 2016, DM, HTN, HLD, PAF, and CVA. Pt is intolerant to multiple statins and presents to lipid clinic for further management.  Pt presents to clinic today with his daughter. He reports experiencing fatigue and muscle pain in his hips and legs about 1 week after starting low dose Crestor. He stopped therapy 2 weeks ago and his symptoms are slowly starting to improve. These symptoms are similar to what he previously experienced on atorvastatin, lovastatin, and pravastatin. He tried taking CoQ10 however this did not help.  Most recent lipid panel drawn 2 weeks ago reflects pt still taking Crestor. Baseline LDL is likely ~90-100.  Current Medications: fish oil 4g daily Intolerances: atorvastatin  daily, lovastatin  daily, pravastatin  and  daily, rosuvastatin  3x per week - myalgias Risk Factors: CAD s/p MI, DM, CVA, age LDL goal: 70mg /dL  Family History: Mother and father have both had heart attacks.  Social History: Denies tobacco, alcohol, or illicit drug use.  Diet: Eats a banana and bowl of cereal each morning. Salad and a meat at lunch. Doesn't eat much in the evening. Drinks water, milk, and decaf coffee and soda  Exercise: Walks every day for 20 minutes (1 mile) - does have some chest pain when he starts to walk but it always disappears after   Labs: 04/04/2017: LDL 128, TG 126, HDL 38, LDL 65 (Crestor  3x per week)  Past Medical History:  Diagnosis Date  . Abdominal discomfort    Abdominal discomfort: EGD 2011 showed no significant abnormalities.  Possibly due to metformin.  . Allergic rhinitis   . Anticoagulated 05/30/2016  . CAD (coronary artery disease)    a. s/p MI in 2004:  stents to the LAD and staged  stent RCA, EF 55%;  b.  NSTEMI (1/16):  LHC - mid LAD stent with diff dist restenosis, prox to mid Dx mod dsz jailed by stent, small OM1 99, mCFX 75, dCFX 80, mRCA stent ok, mPDA mild to mod dsz, EF 55% >> PCI:  Synergy DES to LAD; Synergy DES (x2) mid and dist CFX  . 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  s/p back surgery  . DM (diabetes mellitus) (HCC)   . History of CVA (cerebrovascular accident)    Small CVA seen by head CT (incidental) in 1/13.  Carotid dopplers in 1/13 showed minimal disease  . History of Doppler ultrasound    Arterial dopplers (3/11): no evidence for significant PAD. ABIs 10/12: Normal.    . HLD (hyperlipidemia)   . HTN (hypertension)    had side effects with chlorthalidone  . Hx of cardiovascular stress test    Myoview (1/13): Small fixed apical septal defect with no ischemia, EF 58%.  [name] Hx of echocardiogram    Echo (3/11): EF 60%, normal wall motion, mild MR, mild left atrial enlargement, mildly dilated ascending aorta.  [name] PAF (paroxysmal atrial fibrillation) (HCC) 05/29/2016  . Spondylolisthesis    L4-S1    Current Outpatient Prescriptions on File  Prior to Visit  Medication Sig Dispense Refill  . acetaminophen (TYLENOL) 650 MG CR tablet Take 1,300 mg by mouth every 8 (eight) hours as needed for pain.    [name] amLODipine (NORVASC) 5 MG tablet Take 1 tablet (5 mg total) by mouth daily. 30 tablet 6  . apixaban (ELIQUIS) 5 MG TABS tablet Take 1 tablet (5 mg total) by mouth 2 (two) times daily. 180 tablet 3  . Coenzyme Q10 (CO Q 10 PO) Take 1 tablet by mouth daily.     . fluticasone (FLONASE) 50 MCG/ACT nasal spray Place 2 sprays into both nostrils daily as needed for allergies or rhinitis.   0  . glipiZIDE (GLUCOTROL) 10 MG tablet Take 10 mg by mouth 2 (two) times daily before a meal.      . Insulin Detemir (LEVEMIR FLEXTOUCH) 100 UNIT/ML Pen Inject 20 Units into the skin 2 (two) times daily before a meal.     . isosorbide mononitrate (IMDUR) 60 MG 24 hr tablet Take 120 mg by mouth daily.    [name] losartan (COZAAR) 100 MG tablet Take 1 tablet (100 mg total) by mouth daily. 30 tablet 6  . metoprolol (LOPRESSOR) 25 MG tablet Take 0.5 tablets (12.5 mg total) by mouth 2 (two) times daily. 30 tablet 11  . nitroGLYCERIN (NITROSTAT) 0.4 MG SL tablet Place 0.4 mg under the tongue every 5 (five) minutes as needed for chest pain (x 3 doses).     . Omega-3 Fatty Acids (FISH OIL) 1000 MG CAPS Take 4 capsules (4,000 mg total) by mouth daily. 120 capsule 11   No current facility-administered medications on file prior to visit.     Allergies  Allergen Reactions  . Rosuvastatin Other (See Comments)    Low energy/leg and hip pain    Assessment/Plan:  1. Hyperlipidemia - Most recent LDL was at goal < 70 however pt was taking low dose Crestor at that time. He is intolerant to 4 statins, including low dose Crestor and pravastatin. Do not have a baseline lipid panel on file, however baseline LDL is likely ~90-100 based on expected efficacy of Crestor on recent labs. Discussed Zetia vs PCSK9i with pt. Pt would require further statin washout and baseline lipid panel before insurance would cover PCSK9i. Pt prefers to try Zetia first. Will start Zetia  daily and recheck lipids and LFTs in 3 months. Advised pt to call clinic if he experiences any side effects. Would bring pt in for baseline labs and pursue PCSK9i at that time. He has Medicare insurance so copay would likely be cost prohibitive, however pt will likely qualify for pt assistance.   [name], PharmD, CPP, BCACP [facility] [address] [place], [place] [address] Phone: [phone]; Fax: [phone] 04/22/2017 9:09 AM

## 2017-06-14 ENCOUNTER — Telehealth: Payer: Self-pay

## 2017-06-14 NOTE — Telephone Encounter (Signed)
**Note De-Identified Gladys Deckard Obfuscation** The pts [employer]MS pt assistance application was mailed to this office. I have contacted the pt and per his request I have mailed him his part of the pt assistance application. I will have the provider part filled out but will not get Dr Ends signature until the pt returns his part as it may be to soon.

## 2017-07-25 ENCOUNTER — Telehealth: Payer: Self-pay

## 2017-07-25 NOTE — Telephone Encounter (Signed)
The pt returned his BMS pt assistance application without his out of pocket expense report fro 2019 from his pharmacy and he included his proof of income from 2017 but we need his 2018 proof of income.  The pt walked into the office this morning to discuss his application and what is needed. I explained everything that is needed. He stated that he  Will have his daughter contact me to discuss if she has any questions.

## 2017-07-29 ENCOUNTER — Other Ambulatory Visit: Payer: Self-pay | Admitting: *Deleted

## 2017-07-29 MED ORDER — APIXABAN 5 MG PO TABS: 5.0000 mg | ORAL_TABLET | Freq: Two times a day (BID) | ORAL | 1 refills | Status: DC

## 2017-08-01 ENCOUNTER — Other Ambulatory Visit: Payer: Medicare Other

## 2017-08-01 ENCOUNTER — Encounter (INDEPENDENT_AMBULATORY_CARE_PROVIDER_SITE_OTHER): Payer: Self-pay

## 2017-08-01 DIAGNOSIS — E782 Mixed hyperlipidemia: Secondary | ICD-10-CM

## 2017-08-01 LAB — HEPATIC FUNCTION PANEL
ALK PHOS: 55 IU/L (ref 39–117)
ALT: 17 IU/L (ref 0–44)
AST: 13 IU/L (ref 0–40)
Albumin: 4.7 g/dL (ref 3.5–4.8)
Bilirubin Total: 0.7 mg/dL (ref 0.0–1.2)
Bilirubin, Direct: 0.22 mg/dL (ref 0.00–0.40)
TOTAL PROTEIN: 6.7 g/dL (ref 6.0–8.5)

## 2017-08-01 LAB — LIPID PANEL
CHOL/HDL RATIO: 4.2 ratio (ref 0.0–5.0)
Cholesterol, Total: 152 mg/dL (ref 100–199)
HDL: 36 mg/dL — AB (ref >39)
LDL Calculated: 89 mg/dL (ref 0–99)
Triglycerides: 133 mg/dL (ref 0–149)
VLDL CHOLESTEROL CAL: 27 mg/dL (ref 5–40)

## 2017-08-02 ENCOUNTER — Telehealth: Payer: Self-pay | Admitting: Pharmacist

## 2017-08-02 NOTE — Telephone Encounter (Signed)
Called pt - he states he experienced myalgias on the Zetia. He ran out of Zetia a few days ago and states that he has felt better since stopping Zetia. He is already intolerant to statins including Crestor 5mg  3x per week. Pt is willing to pursue PCSK9i therapy. Will need to fill out patient assistance paperwork once approved since copay will be unaffordable for him. Will f/u with pt once insurance decision is made.

## 2017-08-04 ENCOUNTER — Telehealth: Payer: Self-pay | Admitting: Pharmacist

## 2017-08-04 MED ORDER — ALIROCUMAB 75 MG/ML ~~LOC~~ SOPN: 1.0000 "pen " | PEN_INJECTOR | SUBCUTANEOUS | 11 refills | Status: DC

## 2017-08-04 NOTE — Telephone Encounter (Signed)
[name] has been approved by patient's insurance through 07/18/18. Will send rx to specialty pharmacy to determine copay.

## 2017-08-04 NOTE — Telephone Encounter (Signed)
Copay is cost prohibitive at >$500. Called pt and advised him that I would mail out the [name] PASS pt assistance program form. He will either drop this off in clinic or mail it back in once completed.

## 2017-08-24 ENCOUNTER — Telehealth: Payer: Self-pay

## 2017-08-24 NOTE — Telephone Encounter (Signed)
-----   Message from Awilda MetroMegan E Supple, [facility] sent at 08/22/2017  2:56 PM EST ----- Regarding: Eliquis patient assistance Mr Prevost's daughter, Mickeal Skinnerhoebe, left me a message today but it was in regards to his Eliquis patient assistance that you have been helping with. She had a question about tax returns for him - I called her back and left a message stating that you needed his 2018 tax return instead of his 2017 one, but I couldn't see anything more recent than that. I was hoping you could reach out to her as well in case her question was about a different part of the patient assistance. Her number is [phone] (also listed in his chart).  Thanks, [employer]egan

## 2017-08-24 NOTE — Telephone Encounter (Signed)
I called Phoebe at [phone] and left a message asking her to call me back if she has any further questions concerning the pt's BMS pt assistance for Eliquis.

## 2017-09-16 ENCOUNTER — Emergency Department (HOSPITAL_COMMUNITY): Payer: Medicare Other

## 2017-09-16 ENCOUNTER — Emergency Department (HOSPITAL_COMMUNITY)
Admission: EM | Admit: 2017-09-16 | Discharge: 2017-09-16 | Disposition: A | Discharge status: Home / Self Care | Payer: Medicare Other | Attending: Emergency Medicine | Admitting: Emergency Medicine

## 2017-09-16 ENCOUNTER — Encounter (HOSPITAL_COMMUNITY): Payer: Self-pay | Admitting: Emergency Medicine

## 2017-09-16 DIAGNOSIS — I10 Essential (primary) hypertension: Secondary | ICD-10-CM | POA: Diagnosis not present

## 2017-09-16 DIAGNOSIS — I480 Paroxysmal atrial fibrillation: Secondary | ICD-10-CM

## 2017-09-16 DIAGNOSIS — Z79899 Other long term (current) drug therapy: Secondary | ICD-10-CM | POA: Insufficient documentation

## 2017-09-16 DIAGNOSIS — R0602 Shortness of breath: Secondary | ICD-10-CM | POA: Diagnosis present

## 2017-09-16 DIAGNOSIS — E1159 Type 2 diabetes mellitus with other circulatory complications: Secondary | ICD-10-CM | POA: Diagnosis not present

## 2017-09-16 DIAGNOSIS — Z794 Long term (current) use of insulin: Secondary | ICD-10-CM | POA: Insufficient documentation

## 2017-09-16 DIAGNOSIS — I2510 Atherosclerotic heart disease of native coronary artery without angina pectoris: Secondary | ICD-10-CM | POA: Diagnosis not present

## 2017-09-16 LAB — CBC WITH DIFFERENTIAL/PLATELET
Basophils Absolute: 0 10*3/uL (ref 0.0–0.1)
Basophils Relative: 1 %
EOS ABS: 0.3 10*3/uL (ref 0.0–0.7)
Eosinophils Relative: 4 %
HEMATOCRIT: 37.8 % — AB (ref 39.0–52.0)
HEMOGLOBIN: 13.1 g/dL (ref 13.0–17.0)
LYMPHS ABS: 1.9 10*3/uL (ref 0.7–4.0)
Lymphocytes Relative: 25 %
MCH: 31.6 pg (ref 26.0–34.0)
MCHC: 34.7 g/dL (ref 30.0–36.0)
MCV: 91.1 fL (ref 78.0–100.0)
MONOS PCT: 7 %
Monocytes Absolute: 0.5 10*3/uL (ref 0.1–1.0)
NEUTROS PCT: 63 %
Neutro Abs: 4.8 10*3/uL (ref 1.7–7.7)
Platelets: 229 10*3/uL (ref 150–400)
RBC: 4.15 MIL/uL — ABNORMAL LOW (ref 4.22–5.81)
RDW: 13.1 % (ref 11.5–15.5)
WBC: 7.6 10*3/uL (ref 4.0–10.5)

## 2017-09-16 LAB — COMPREHENSIVE METABOLIC PANEL
ALK PHOS: 53 U/L (ref 38–126)
ALT: 21 U/L (ref 17–63)
ANION GAP: 8 (ref 5–15)
AST: 20 U/L (ref 15–41)
Albumin: 3.9 g/dL (ref 3.5–5.0)
BUN: 23 mg/dL — ABNORMAL HIGH (ref 6–20)
CALCIUM: 9.3 mg/dL (ref 8.9–10.3)
CO2: 21 mmol/L — ABNORMAL LOW (ref 22–32)
Chloride: 111 mmol/L (ref 101–111)
Creatinine, Ser: 1.02 mg/dL (ref 0.61–1.24)
GFR calc Af Amer: >60 mL/min (ref >60)
Glucose, Bld: 118 mg/dL — ABNORMAL HIGH (ref 65–99)
Potassium: 4.1 mmol/L (ref 3.5–5.1)
SODIUM: 140 mmol/L (ref 135–145)
TOTAL PROTEIN: 6.3 g/dL — AB (ref 6.5–8.1)
Total Bilirubin: 0.9 mg/dL (ref 0.3–1.2)

## 2017-09-16 LAB — MAGNESIUM: MAGNESIUM: 2 mg/dL (ref 1.7–2.4)

## 2017-09-16 LAB — I-STAT TROPONIN, ED: Troponin i, poc: 0.01 ng/mL (ref 0.00–0.08)

## 2017-09-16 NOTE — ED Provider Notes (Signed)
[facility] Provider Note   CSN: [id number] Arrival date & time: 09/16/17  1652     History   Chief Complaint Chief Complaint  Patient presents with  . Atrial Fibrillation    HPI Joseph [name] is a 77 y.o. male.  Patient is a 77 year old male with a history of coronary artery disease status post multiple stents, diabetes, CVA, hypertension who is currently on Eliquis presenting today with profound weakness and shortness of breath with exertion that started since waking up this morning.  He has felt occasional skipping of his heart as well.  Patient states over the last few months he has noticed some weakness and shortness of breath with exertion but usually improves with rest.  He is denied any chest pain.  Patient in the last few months is also been dealing with sinus infections and URI symptoms which are almost completely resolved at this time.  He stopped taking a cholesterol medicine about 3 months ago but otherwise has had no medication changes.  He is seeing his cardiologist and his PCP within the last 6 weeks and never told that he had atrial fibrillation in the past.  Patient has taken every dose of his Eliquis and not missed any.  He currently denies any chest pain or shortness of breath but states when he gets up to walk he is extremely weak and has to sit down.  He is only had trace swelling in his legs that is baseline.  He has been eating and drinking normally and denies any abdominal pain, nausea or vomiting.   The history is provided by the patient.    Past Medical History:  Diagnosis Date  . Abdominal discomfort    Abdominal discomfort: EGD 2011 showed no significant abnormalities.  Possibly due to metformin.  . Allergic rhinitis   . Anticoagulated 05/30/2016  . CAD (coronary artery disease)    a. s/p MI in 2004:  stents to the LAD and staged stent RCA, EF 55%;  b.  NSTEMI (1/16):  LHC - mid LAD stent with diff dist restenosis,  prox to mid Dx mod dsz jailed by stent, small OM1 99, mCFX 75, dCFX 80, mRCA stent ok, mPDA mild to mod dsz, EF 55% >> PCI:  Synergy DES to LAD; Synergy DES (x2) mid and dist CFX  . 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  s/p back surgery  . DM (diabetes mellitus) (HCC)   . History of CVA (cerebrovascular accident)    Small CVA seen by head CT (incidental) in 1/13.  Carotid dopplers in 1/13 showed minimal disease  . History of Doppler ultrasound    Arterial dopplers (3/11): no evidence for significant PAD. ABIs 10/12: Normal.    . HLD (hyperlipidemia)   . HTN (hypertension)    had side effects with chlorthalidone  . Hx of cardiovascular stress test    Myoview (1/13): Small fixed apical septal defect with no ischemia, EF 58%.  [name]. Hx of echocardiogram    Echo (3/11): EF 60%, normal wall motion, mild MR, mild left atrial enlargement, mildly dilated ascending aorta.  [name]. PAF (paroxysmal atrial fibrillation) (HCC) 05/29/2016  . Spondylolisthesis    L4-S1    Patient Active Problem List   Diagnosis Date Noted  . 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 Mixed hyperlipidemia 04/11/2017  . Anticoagulated 05/30/2016  . Paroxysmal  atrial fibrillation (HCC) 05/29/2016  . Pre-operative cardiovascular examination 12/30/2015  . Exertional angina (HCC) 08/02/2014  . NSTEMI (non-ST elevated myocardial infarction) (HCC)   . Ischemic chest pain   . Bradycardia 02/02/2012  . CVA (cerebral infarction) 08/01/2011  . Leg pain 03/31/2011  . Abdominal pain 10/13/2010  . Hip pain 10/13/2010  . Pure hypercholesterolemia 09/09/2009  . Essential hypertension 09/09/2009  . Peripheral vascular disease (HCC) 09/09/2009  . 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  . CAD S/P percutaneous coronary angioplasty 09/08/2009    Past Surgical History:  Procedure  Laterality Date  . CORONARY ANGIOGRAM  11/13/2014   Procedure: CORONARY ANGIOGRAM;  Surgeon: Laurey Morale, MD;  Location: [facility] CATH LAB;  Service: Cardiovascular;;  . CORONARY STENT PLACEMENT  2004   x2 LAD and RCA   . LEFT HEART CATHETERIZATION WITH CORONARY ANGIOGRAM N/A 08/02/2014   Procedure: LEFT HEART CATHETERIZATION WITH CORONARY ANGIOGRAM;  Surgeon: Corky Crafts, MD;  Location: [facility] CATH LAB;  Service: Cardiovascular;  Laterality: N/A;  . PROSTATE SURGERY    . SPINAL FUSION     L4-S1       Home Medications    Prior to Admission medications   Medication Sig Start Date End Date Taking? Authorizing Provider  acetaminophen (TYLENOL) 650 MG CR tablet Take 1,300 mg by mouth every 8 (eight) hours as needed for pain.    [provider]  Alirocumab ([name]) 75 MG/ML SOPN Inject 1 pen into the skin every 14 (fourteen) days. 08/04/17   End, Cristal Deer, MD  amLODipine (NORVASC) 5 MG tablet Take 1 tablet (5 mg total) by mouth daily. 05/31/16   Leone Brand, NP  apixaban (ELIQUIS) 5 MG TABS tablet Take 1 tablet (5 mg total) by mouth 2 (two) times daily. 07/29/17   End, Cristal Deer, MD  fluticasone (FLONASE) 50 MCG/ACT nasal spray Place 2 sprays into both nostrils daily as needed for allergies or rhinitis.  03/31/15   [provider]  glipiZIDE (GLUCOTROL) 10 MG tablet Take 10 mg by mouth 2 (two) times daily before a meal.      [provider]  Insulin Detemir (LEVEMIR FLEXTOUCH) 100 UNIT/ML Pen Inject 20 Units into the skin 2 (two) times daily before a meal.    [provider]  isosorbide mononitrate (IMDUR) 60 MG 24 hr tablet Take 120 mg by mouth daily.    [provider]  losartan (COZAAR) 100 MG tablet Take 1 tablet (100 mg total) by mouth daily. 01/13/17 04/13/17  End, Cristal Deer, MD  metoprolol (LOPRESSOR) 25 MG tablet Take 0.5 tablets (12.5 mg total) by mouth 2 (two) times daily. 08/03/14   Tereso Newcomer T, PA-C  nitroGLYCERIN (NITROSTAT)  0.4 MG SL tablet Place 0.4 mg under the tongue every 5 (five) minutes as needed for chest pain (x 3 doses).     [provider]  Omega-3 Fatty Acids (FISH OIL) 1000 MG CAPS Take 4 capsules (4,000 mg total) by mouth daily. 06/03/16   Manson Passey, PA    Family History Family History  Problem Relation Age of Onset  . Heart attack Mother   . Heart attack Father   . Stroke Neg Hx     Social History Social History   Tobacco Use  . Smoking status: Never Smoker  . Smokeless tobacco: Never Used  Substance Use Topics  . Alcohol use: No  . Drug use: No     Allergies   Rosuvastatin and Zetia [ezetimibe]   Review  of Systems Review of Systems  All other systems reviewed and are negative.    Physical Exam Updated Vital Signs There were no vitals taken for this visit.  Physical Exam  Constitutional: He is oriented to person, place, and time. He appears well-developed and well-nourished. No distress.  HENT:  Head: Normocephalic and atraumatic.  Mouth/Throat: Oropharynx is clear and moist.  Eyes: Conjunctivae and EOM are normal. Pupils are equal, round, and reactive to light.  Neck: Normal range of motion. Neck supple.  Cardiovascular: Normal rate and intact distal pulses. An irregularly irregular rhythm present.  No murmur heard. Pulmonary/Chest: Effort normal and breath sounds normal. No respiratory distress. He has no wheezes. He has no rales.  Abdominal: Soft. He exhibits no distension. There is no tenderness. There is no rebound and no guarding.  Musculoskeletal: Normal range of motion. He exhibits edema. He exhibits no tenderness.  Trace edema in bilateral ankles  Neurological: He is alert and oriented to person, place, and time.  Skin: Skin is warm and dry. Capillary refill takes less than 2 seconds. No rash noted. No erythema.  Psychiatric: He has a normal mood and affect. His behavior is normal.  Nursing note and vitals reviewed.    ED Treatments /  Results  Labs (all labs ordered are listed, but only abnormal results are displayed) Labs Reviewed  CBC WITH DIFFERENTIAL/PLATELET - Abnormal; Notable for the following components:      Result Value   RBC 4.15 (*)    HCT 37.8 (*)    All other components within normal limits  COMPREHENSIVE METABOLIC PANEL - Abnormal; Notable for the following components:   CO2 21 (*)    Glucose, Bld 118 (*)    BUN 23 (*)    Total Protein 6.3 (*)    All other components within normal limits  MAGNESIUM  I-STAT TROPONIN, ED    EKG  EKG Interpretation  Date/Time:  Friday September 16 2017 17:05:24 EST Ventricular Rate:  76 PR Interval:    QRS Duration: 95 QT Interval:  387 QTC Calculation: 436 R Axis:   -43 Text Interpretation:  new Atrial fibrillation Left axis deviation Probable anteroseptal infarct, old Confirmed by Gwyneth Sprout ([id number]) on 09/16/2017 6:20:49 PM       Radiology Dg Chest 2 View  Result Date: 09/16/2017 CLINICAL DATA:  Shortness of breath. EXAM: CHEST  2 VIEW COMPARISON:  Radiographs of May 25, 2016. FINDINGS: The heart size and mediastinal contours are within normal limits. Both lungs are clear. No pneumothorax or pleural effusion is noted. The visualized skeletal structures are unremarkable. IMPRESSION: No active cardiopulmonary disease. Electronically Signed   By: Lupita Raider, M.D.   On: 09/16/2017 18:44    Procedures Procedures (including critical care time)  Medications Ordered in ED Medications - No data to display   Initial Impression / Assessment and Plan / ED Course  I have reviewed the triage vital signs and the nursing notes.  Pertinent labs & imaging results that were available during my care of the patient were reviewed by me and considered in my medical decision making (see chart for details).     Patient is a 77 year old male presenting today with new onset of atrial fibrillation with profound weakness and shortness of breath with exertion.   Patient is otherwise well-appearing on exam.  Atrial fibrillation is rate controlled at this time in the 80s and 90s.  Patient does take Eliquis regularly.  Denies any chest pain or concern for ACS at  this time.  Will ensure patient has normal labs with a CBC, CMP, troponin, magnesium level and will get a chest x-ray.  EKG is pending.  If lab work is reassuring and patient's symptoms have only been going on for 1 day feel he would be a good candidate for cardioversion as he is already taking Eliquis regularly.  7:18 PM Pt's labs are reassuring.  When going back to speak with the pt he has spontaneously converted to sinus rhythm and states he feels much better.  Will ambulate pt but o/w can go home.  Will have pt f/u with Dr. Okey Dupre and continue eliquis CHA2DS2/VAS Stroke Risk Points      5 >= 2 Points: High Risk  1 - 1.99 Points: Medium Risk  0 Points: Low Risk    This is the only CHA2DS2/VAS Stroke Risk Points available for the past  year.:  Change: N/A     Details    This score determines the patient's risk of having a stroke if the  patient has atrial fibrillation.       Points Metrics  0 Has Congestive Heart Failure:  No   1 Has Vascular Disease:  Yes   1 Has Hypertension:  Yes   2 Age:  54   1 Has Diabetes:  Yes   0 Had Stroke:  No  Had TIA:  No  Had thromboembolism:  No   0 Male:  No      8:17 PM Pt ambulated without difficulty and will d/c home.       Final Clinical Impressions(s) / ED Diagnoses   Final diagnoses:  Paroxysmal atrial fibrillation [facility])    ED Discharge Orders    None       Gwyneth Sprout, MD 09/16/17 2019

## 2017-09-16 NOTE — Discharge Instructions (Signed)
Continue taking all your home meds at this time.

## 2017-09-16 NOTE — ED Notes (Signed)
Pt ambulated around nurses station w/o difficulty. Denies CP or SOB.

## 2017-09-16 NOTE — ED Notes (Signed)
Patient transported to X-ray 

## 2017-09-16 NOTE — ED Triage Notes (Signed)
Pt here from home with c/o afib and sob with weakness, no chest pain , pt states that he has been working hard the last few days building a barn,

## 2017-09-19 ENCOUNTER — Telehealth: Payer: Self-pay | Admitting: Internal Medicine

## 2017-09-19 ENCOUNTER — Telehealth (HOSPITAL_COMMUNITY): Payer: Self-pay | Admitting: *Deleted

## 2017-09-19 NOTE — Telephone Encounter (Signed)
Patient c/o Palpitations:  High priority if patient c/o lightheadedness, shortness of breath, or chest pain  1) How long have you had palpitations/irregular HR/ Afib? Are you having the symptoms now? Having " weak feeling"   2) Are you currently experiencing lightheadedness, SOB or CP? No   3) Do you have a history of afib (atrial fibrillation) or irregular heart rhythm? Yes about 30 years ago   4) Have you checked your BP or HR? (document readings if available): n/a  5) Are you experiencing any other symptoms? "weak feeling"  Patient was seen in ED for AFIB and was told to make Dr.End aware, patient has an appointment on  09-22-17.

## 2017-09-19 NOTE — Telephone Encounter (Signed)
I cld pt to offer appt to afib clinic since pt in the ER 09/16/17.  Pt stated that he will call Dr. Laurelyn SickleEnds office to see if they feel he needs to be seen by Dr. Okey DupreEnd or one of his PAs.  Pt also stated that he has a physical with PCP in the morning as well.  Pt will call back if he is not able to get appt at Dr. Laurelyn SickleEnds office.

## 2017-09-19 NOTE — Telephone Encounter (Signed)
Spoke with patient who advised me that he was in the ED on Friday night (3/1).Joseph [name].  He felt "funny" and was SOB.  He had a "fast" heart rate, but not sure what it was.  He has hx of Afib and is taking Eliquis.   He has an appt with us on 3/7.  He felt good yesterday (Sunday), but woke up this morning "feeling weak" and a little SOB.   Basically, the ED told him to make us aware of his symptom and visit.Joseph [name].  Please advise, thank you..Joseph [name]

## 2017-09-20 NOTE — Telephone Encounter (Signed)
Spoke with patient in regards to symptoms and Dr. Serita KyleEnd's recommendations.. Patient verbalized understanding and we'll see him 3/7.Joseph [name]..Joseph [name]

## 2017-09-20 NOTE — Telephone Encounter (Signed)
He should f/u with us as planned on 3/7. If his symptoms worsen in the meantime, he should let us know.  Yvonne Kendallhristopher Dayona Shaheen, MD [facility] Pager: [phone]

## 2017-09-21 ENCOUNTER — Encounter: Payer: Self-pay | Admitting: Internal Medicine

## 2017-09-22 ENCOUNTER — Encounter: Payer: Self-pay | Admitting: Internal Medicine

## 2017-09-22 ENCOUNTER — Ambulatory Visit (INDEPENDENT_AMBULATORY_CARE_PROVIDER_SITE_OTHER): Payer: Medicare Other | Admitting: Internal Medicine

## 2017-09-22 VITALS — BP 126/62 | HR 62 | Ht 74.0 in | Wt 220.0 lb

## 2017-09-22 DIAGNOSIS — E785 Hyperlipidemia, unspecified: Secondary | ICD-10-CM | POA: Diagnosis not present

## 2017-09-22 DIAGNOSIS — R5383 Other fatigue: Secondary | ICD-10-CM | POA: Diagnosis not present

## 2017-09-22 DIAGNOSIS — I25118 Atherosclerotic heart disease of native coronary artery with other forms of angina pectoris: Secondary | ICD-10-CM

## 2017-09-22 DIAGNOSIS — I10 Essential (primary) hypertension: Secondary | ICD-10-CM

## 2017-09-22 DIAGNOSIS — I480 Paroxysmal atrial fibrillation: Secondary | ICD-10-CM

## 2017-09-22 NOTE — Progress Notes (Signed)
Follow-up Outpatient Visit Date: 09/22/2017  Primary Care Provider: Dema Severin, NP [address] [facility] [place] [address]  Chief Complaint: Fatigue and atrial fibrillation  HPI:  Joseph [name] is a 77 y.o. year-old male with history of coronary artery disease status post PCI's 2004 and 2016, paroxysmal atrial fibrillation, hypertension, hyperlipidemia, diabetes mellitus, and stroke, who presents for follow-up of CAD, PAF, and hyperlipidemia. I last saw him in September, at which time he was doing well. Due to statin intolerance, Joseph [name] was started on alirocumab ([name]) through the lipid clinic.  Joseph [name] presented to the ED last week with generalized weakness, palpitations, and exertional dyspnea. This developed after he had been working particularly hard on hanging some sheet metal on a shed on his property. He feels like he overdid it. He was found to be in atrial fibrillation with adequate heart rate control. He spontaneously converted to sinus rhythm in the ED with significant improvement in his symptoms. Today, he reports feeling back to baseline. Since leaving the ED, he noticed one day where he felt a bit sluggish. He has not had any chest pain or lightheadedness. He even when he is not in atrial fibrillation, Joseph [name] is concerned about some generalized fatigue. He frequently has to stop after being active for about 15 minutes in order to rest. He has stable mild lower extremity edema. He is in the process of applying for approval for [name] and is no longer on statin therapy.  --------------------------------------------------------------------------------------------------  Past Medical History:  Diagnosis Date  . Abdominal discomfort    Abdominal discomfort: EGD 2011 showed no significant abnormalities.  Possibly due to metformin.  . Allergic rhinitis   . Anticoagulated 05/30/2016  . CAD (coronary artery disease)    a. s/p MI in 2004:  stents to the LAD and staged stent RCA, EF  55%;  b.  NSTEMI (1/16):  LHC - mid LAD stent with diff dist restenosis, prox to mid Dx mod dsz jailed by stent, small OM1 99, mCFX 75, dCFX 80, mRCA stent ok, mPDA mild to mod dsz, EF 55% >> PCI:  Synergy DES to LAD; Synergy DES (x2) mid and dist CFX  . 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  s/p back surgery  . DM (diabetes mellitus) (HCC)   . History of CVA (cerebrovascular accident)    Small CVA seen by head CT (incidental) in 1/13.  Carotid dopplers in 1/13 showed minimal disease  . History of Doppler ultrasound    Arterial dopplers (3/11): no evidence for significant PAD. ABIs 10/12: Normal.    . HLD (hyperlipidemia)   . HTN (hypertension)    had side effects with chlorthalidone  . Hx of cardiovascular stress test    Myoview (1/13): Small fixed apical septal defect with no ischemia, EF 58%.  [name] Hx of echocardiogram    Echo (3/11): EF 60%, normal wall motion, mild MR, mild left atrial enlargement, mildly dilated ascending aorta.  [name] PAF (paroxysmal atrial fibrillation) (HCC) 05/29/2016  . Spondylolisthesis    L4-S1   Past Surgical History:  Procedure Laterality Date  . CORONARY ANGIOGRAM  11/13/2014   Procedure: CORONARY ANGIOGRAM;  Surgeon: Laurey Morale, MD;  Location: [facility] CATH LAB;  Service: Cardiovascular;;  . CORONARY STENT PLACEMENT  2004   x2 LAD and RCA   . LEFT HEART CATHETERIZATION WITH CORONARY ANGIOGRAM N/A 08/02/2014   Procedure: LEFT HEART CATHETERIZATION WITH CORONARY ANGIOGRAM;  Surgeon: Corky CraftsJayadeep S Varanasi, MD;  [facility];  Service: Cardiovascular;  Laterality: N/A;  . PROSTATE SURGERY    . SPINAL FUSION     L4-S1    Current Meds  Medication Sig  . acetaminophen (TYLENOL) 650 MG CR tablet Take 1,300 mg by mouth every 8 (eight) hours as needed for pain.  . Alirocumab ([name]) 75 MG/ML SOPN Inject 1 pen into the skin every 14 (fourteen) days.  [name]. amLODipine (NORVASC) 5 MG tablet Take 1  tablet (5 mg total) by mouth daily.  [name]. apixaban (ELIQUIS) 5 MG TABS tablet Take 1 tablet (5 mg total) by mouth 2 (two) times daily.  . Coenzyme Q10 100 MG capsule Take 1 capsule by mouth daily.  . fluticasone (FLONASE) 50 MCG/ACT nasal spray Place 2 sprays into both nostrils daily as needed for allergies or rhinitis.   [name]. glipiZIDE (GLUCOTROL) 10 MG tablet Take 10 mg by mouth 2 (two) times daily before a meal.    . Insulin Detemir (LEVEMIR FLEXTOUCH) 100 UNIT/ML Pen Inject 20 Units into the skin 2 (two) times daily before a meal.  . isosorbide mononitrate (IMDUR) 60 MG 24 hr tablet Take 120 mg by mouth daily.  [name]. lisinopril (PRINIVIL,ZESTRIL) 40 MG tablet Take 20 mg by mouth daily with breakfast.  . metoprolol (LOPRESSOR) 25 MG tablet Take 0.5 tablets (12.5 mg total) by mouth 2 (two) times daily.  . nitroGLYCERIN (NITROSTAT) 0.4 MG SL tablet Place 0.4 mg under the tongue every 5 (five) minutes as needed for chest pain (x 3 doses).   . Omega-3 Fatty Acids (FISH OIL) 1000 MG CAPS Take 4 capsules (4,000 mg total) by mouth daily.  [name]. triamcinolone cream (KENALOG) 0.5 % Apply 1 application topically as needed for irritation.    Allergies: Rosuvastatin and Zetia [ezetimibe]  Social History   Socioeconomic History  . Marital status: Divorced    Spouse name: Not on file  . Number of children: 2  . Years of education: Not on file  . Highest education level: Not on file  Social Needs  . Financial resource strain: Not on file  . Food insecurity - worry: Not on file  . Food insecurity - inability: Not on file  . Transportation needs - medical: Not on file  . Transportation needs - non-medical: Not on file  Occupational History  . Occupation: retired  Tobacco Use  . Smoking status: Never Smoker  . Smokeless tobacco: Never Used  Substance and Sexual Activity  . Alcohol use: No  . Drug use: No  . Sexual activity: Not on file  Other Topics Concern  . Not on file  Social History Narrative  .  Not on file    Family History  Problem Relation Age of Onset  . Heart attack Mother   . Heart attack Father   . Stroke Neg Hx     Review of Systems: A 12-system review of systems was performed and was negative except as noted in the HPI.  --------------------------------------------------------------------------------------------------  Physical Exam: BP 126/62   Pulse 62   Ht 6\' 2"  (1.88 m)   Wt 220 lb (99.8 kg)   SpO2 99%   BMI 28.25 kg/m   General:  Overweight man, seated comfortably in the exam room. HEENT: No conjunctival pallor or scleral icterus. Moist mucous membranes.  OP clear. Neck: Supple without lymphadenopathy, thyromegaly, JVD, or HJR.  Lungs: Normal work of breathing. Clear to auscultation bilaterally without wheezes or crackles. Heart: Regular rate and rhythm  without murmurs, rubs, or gallops. Non-displaced PMI. Abd: Bowel sounds present. Soft, NT/ND without hepatosplenomegaly Ext: Trace pretibial edema bilaterally. Radial, PT, and DP pulses are 2+ bilaterally. Skin: Warm and dry without rash.  EKG (09/16/17):  Normal sinus rhythm with left axis deviation and poor R-wave progression in V1 and V2. Atrial fibrillation noted earlier today has resolved.  Lab Results  Component Value Date   WBC 7.6 09/16/2017   HGB 13.1 09/16/2017   HCT 37.8 (L) 09/16/2017   MCV 91.1 09/16/2017   PLT 229 09/16/2017    Lab Results  Component Value Date   NA 140 09/16/2017   K 4.1 09/16/2017   CL 111 09/16/2017   CO2 21 (L) 09/16/2017   BUN 23 (H) 09/16/2017   CREATININE 1.02 09/16/2017   GLUCOSE 118 (H) 09/16/2017   ALT 21 09/16/2017    Lab Results  Component Value Date   CHOL 152 08/01/2017   HDL 36 (L) 08/01/2017   LDLCALC 89 08/01/2017   LDLDIRECT 83.0 10/28/2014   TRIG 133 08/01/2017   CHOLHDL 4.2 08/01/2017    --------------------------------------------------------------------------------------------------  ASSESSMENT AND PLAN: Paroxysmal atrial  fibrillation Joseph [name] was quite symptomatic with atrial fibrillation last week. As far as he can tell, this is the first recurrence in several decades. He converted spontaneously to sinus rhythm. Exam today suggest that he remains in sinus rhythm. We will continue with apixaban and low-dose metoprolol. We discussed initiation of antiarrhythmic therapy. However, given that he has only had one occurrence in many years, we have agreed to defer this. We will obtain a transthoracic echocardiogram to assess for new structural abnormalities that may be driving this.  Coronary artery disease with stable angina No chest pain to suggest worsening coronary insufficiency, though chronic fatigue could be an anginal equivalent. We have agreed to continue with current doses of metoprolol and isosorbide mononitrate. We will obtain a transthoracic echocardiogram. If there is evidence of new wall motion abnormality or reduced LVEF, we will need to consider cardiac catheterization.  Fatigue Other than trace pedal edema which is long-standing, Joseph [name] appears euvolemic. Given fatigue and recurrence of atrial fibrillation, we have agreed to obtain a transthoracic echocardiogram to assess for new structural abnormalities.  Hypertension Blood pressure is well controlled today. No medication changes.  Hyperlipidemia Joseph [name] has remained intolerant of statins. He is in the process of applying for approval to receive [name] with assistance of our lipid clinic. I have encouraged him to move forward with this.  Follow-up: Return to clinic in 6 weeks.  Yvonne Kendall, MD 09/22/2017 10:43 AM

## 2017-09-22 NOTE — Patient Instructions (Addendum)
Medication Instructions:  Your physician recommends that you continue on your current medications as directed. Please refer to the Current Medication list given to you today.  -- If you need a refill on your cardiac medications before your next appointment, please call your pharmacy. --  Labwork: None ordered  Testing/Procedures: Your physician has requested that you have an echocardiogram. Echocardiography is a painless test that uses sound waves to create images of your heart. It provides your doctor with information about the size and shape of your heart and how well your heart's chambers and valves are working. This procedure takes approximately one hour. There are no restrictions for this procedure.   Follow-Up: Your physician wants you to follow-up in: 6 weeks with APP    Thank you for choosing [facility]!!    Any Other Special Instructions Will Be Listed Below (If Applicable).

## 2017-09-23 NOTE — Telephone Encounter (Signed)
The pt brought in his 2018 proof of income. I completed the providers part of the application, Dr End signed it and I have faxed it to [employer].

## 2017-09-30 ENCOUNTER — Other Ambulatory Visit: Payer: Self-pay

## 2017-09-30 ENCOUNTER — Ambulatory Visit (HOSPITAL_COMMUNITY): Payer: Medicare Other | Attending: Cardiovascular Disease

## 2017-09-30 DIAGNOSIS — R5383 Other fatigue: Secondary | ICD-10-CM | POA: Insufficient documentation

## 2017-09-30 DIAGNOSIS — I252 Old myocardial infarction: Secondary | ICD-10-CM | POA: Insufficient documentation

## 2017-09-30 DIAGNOSIS — I2510 Atherosclerotic heart disease of native coronary artery without angina pectoris: Secondary | ICD-10-CM | POA: Diagnosis not present

## 2017-09-30 DIAGNOSIS — I340 Nonrheumatic mitral (valve) insufficiency: Secondary | ICD-10-CM | POA: Diagnosis not present

## 2017-09-30 DIAGNOSIS — I119 Hypertensive heart disease without heart failure: Secondary | ICD-10-CM | POA: Insufficient documentation

## 2017-09-30 DIAGNOSIS — I480 Paroxysmal atrial fibrillation: Secondary | ICD-10-CM | POA: Insufficient documentation

## 2017-09-30 DIAGNOSIS — E119 Type 2 diabetes mellitus without complications: Secondary | ICD-10-CM | POA: Diagnosis not present

## 2017-09-30 DIAGNOSIS — E785 Hyperlipidemia, unspecified: Secondary | ICD-10-CM | POA: Diagnosis not present

## 2017-10-26 ENCOUNTER — Telehealth: Payer: Self-pay | Admitting: Pharmacist

## 2017-10-26 NOTE — Telephone Encounter (Signed)
Received fax from [facility] with need for reason why pt on Eliquis and request for clearance to hold for colonoscopy on 11/21/17.   Patient with diagnosis of Afib on Eliquis for anticoagulation.    Procedure: colonoscopy Date of procedure: 11/21/2017  CHADS2-VASc score of  7 (CHF, HTN, AGE, DM2, stroke/tia x 2, CAD, AGE, male)  CrCl 6483ml/min  Per office protocol, patient can hold Eliquis for 24 hours prior to procedure.  Given history of stroke would recommend resume Eliquis as soon as safe after procedure.   Faxed back via epic to number provided [phone]

## 2017-10-31 ENCOUNTER — Encounter: Payer: Self-pay | Admitting: Physician Assistant

## 2017-10-31 ENCOUNTER — Encounter: Payer: Self-pay | Admitting: *Deleted

## 2017-10-31 ENCOUNTER — Ambulatory Visit (INDEPENDENT_AMBULATORY_CARE_PROVIDER_SITE_OTHER): Payer: Medicare Other | Admitting: Physician Assistant

## 2017-10-31 VITALS — BP 132/70 | HR 59 | Ht 74.0 in | Wt 216.1 lb

## 2017-10-31 DIAGNOSIS — R5383 Other fatigue: Secondary | ICD-10-CM | POA: Diagnosis not present

## 2017-10-31 DIAGNOSIS — I25118 Atherosclerotic heart disease of native coronary artery with other forms of angina pectoris: Secondary | ICD-10-CM

## 2017-10-31 DIAGNOSIS — I10 Essential (primary) hypertension: Secondary | ICD-10-CM

## 2017-10-31 DIAGNOSIS — I480 Paroxysmal atrial fibrillation: Secondary | ICD-10-CM | POA: Diagnosis not present

## 2017-10-31 DIAGNOSIS — E7800 Pure hypercholesterolemia, unspecified: Secondary | ICD-10-CM

## 2017-10-31 MED ORDER — LISINOPRIL 20 MG PO TABS: 20.0000 mg | ORAL_TABLET | Freq: Every day | ORAL | 3 refills | Status: DC

## 2017-10-31 NOTE — Progress Notes (Signed)
Cardiology Office Note:    Date:  10/31/2017   ID:  Joseph [name], DOB 1940-08-11, MRN [id number]  PCP:  Joseph Severin, NP  Cardiologist:  Joseph Kendall, MD   Referring MD: Joseph Severin, NP   Chief Complaint  Patient presents with  . Follow-up    Fatigue, CAD, A. fib    History of Present Illness:    Joseph [name] is a 77 y.o. male with coronary artery disease status post anterior MI in 2004 treated with stent to the LAD and staged PCI with Taxus stent to the RCA, non-STEMI in January 2016 treated with DES to the mid LAD and DES to the mid and distal LCx, paroxysmal atrial fibrillation, hypertension, hyperlipidemia (statin intolerant), diabetes, prior stroke.  He was last seen by Dr. Okey [name] 09/22/17.  He had recently been to the emergency room with atrial fibrillation.  He was back in normal sinus rhythm.  He did complain of fatigue.  Follow-up echo was arranged.  This demonstrated normal LV function with moderate diastolic dysfunction and no significant valvular abnormalities.  Joseph [name] returns for follow-up.  He is here alone.  We discussed the findings of his echocardiogram.  He continues to note issues with fatigue.  This has been going on for over a year now.  He feels as though it may be getting a little worse.  He denies chest discomfort.  He denies significant worsening dyspnea.  He denies PND or significant edema.  He denies syncope.  He denies any bleeding issues, weight changes, fevers, night sweats.  Prior CV studies:   The following studies were reviewed today:  Echo 09/30/17 Mild concentric LVH, EF 55-60, normal wall motion, grade 2 diastolic dysfunction, mildly dilated ascending aorta (38 mm), mild MR, mild LAE, PASP 26  Event monitor 06/09/16 NSR.  No atrial fibrillation noted.   Nuclear stress test 04/08/16 Large, fixed anteroseptal perfusion defect with associated akinesis suggestive of scar. No significant reversible ischemia. LVEF 55%. This is an intermediate risk  study based on infarct size (20%).  Cardiac Catheterization 11/13/14 LM distal 30-40 LAD mid stent patent; D1 proximal 80-90 (med rx recommended) LCx stents in the mid AV LCx patent; OM1 proximal 100; OM3 proximal 90 (small) RCA irregularities; PLV serial 50 (small); PDA mid 40-50, distal 30  Echo 10/31/14 Moderate concentric LVH, EF 60-65, normal wall motion, grade 1 diastolic dysfunction, ascending aorta 41 mm, moderate TR  Past Medical History:  Diagnosis Date  . Abdominal discomfort    Abdominal discomfort: EGD 2011 showed no significant abnormalities.  Possibly due to metformin.  . Allergic rhinitis   . Anticoagulated 05/30/2016  . CAD (coronary artery disease)    a. s/p MI in 2004:  stents to the LAD and staged stent RCA, EF 55%;  b.  NSTEMI (1/16):  LHC - mid LAD stent with diff dist restenosis, prox to mid Dx mod dsz jailed by stent, small OM1 99, mCFX 75, dCFX 80, mRCA stent ok, mPDA mild to mod dsz, EF 55% >> PCI:  Synergy DES to LAD; Synergy DES (x2) mid and dist CFX  . 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  s/p back surgery  . DM (diabetes mellitus) (HCC)   . History of CVA (cerebrovascular accident)    Small CVA seen by head CT (incidental) in 1/13.  Carotid dopplers in 1/13 showed minimal disease  . History of Doppler ultrasound    Arterial  dopplers (3/11): no evidence for significant PAD. ABIs 10/12: Normal.    . HLD (hyperlipidemia)   . HTN (hypertension)    had side effects with chlorthalidone  . Hx of cardiovascular stress test    Myoview (1/13): Small fixed apical septal defect with no ischemia, EF 58%.  [name] Hx of echocardiogram    Echo (3/11): EF 60%, normal wall motion, mild MR, mild left atrial enlargement, mildly dilated ascending aorta.  [name] PAF (paroxysmal atrial fibrillation) (HCC) 05/29/2016  . Spondylolisthesis    L4-S1   Surgical Hx: The patient  has a past surgical history that includes  Coronary stent placement (2004); Prostate surgery; Spinal fusion; left heart catheterization with coronary angiogram (N/A, 08/02/2014); and coronary angiogram (11/13/2014).   Current Medications: Current Meds  Medication Sig  . acetaminophen (TYLENOL) 650 MG CR tablet Take 1,300 mg by mouth every 8 (eight) hours as needed for pain.  [name] amLODipine (NORVASC) 5 MG tablet Take 1 tablet (5 mg total) by mouth daily.  [name] apixaban (ELIQUIS) 5 MG TABS tablet Take 1 tablet (5 mg total) by mouth 2 (two) times daily.  . Coenzyme Q10 100 MG capsule Take 1 capsule by mouth daily.  . fluticasone (FLONASE) 50 MCG/ACT nasal spray Place 2 sprays into both nostrils daily as needed for allergies or rhinitis.   [name] glipiZIDE (GLUCOTROL) 10 MG tablet Take 10 mg by mouth 2 (two) times daily before a meal.    . Insulin Detemir (LEVEMIR FLEXTOUCH) 100 UNIT/ML Pen Inject 20 Units into the skin 2 (two) times daily before a meal.  . isosorbide mononitrate (IMDUR) 60 MG 24 hr tablet Take 120 mg by mouth daily.  . metoprolol (LOPRESSOR) 25 MG tablet Take 0.5 tablets (12.5 mg total) by mouth 2 (two) times daily.  . nitroGLYCERIN (NITROSTAT) 0.4 MG SL tablet Place 0.4 mg under the tongue every 5 (five) minutes as needed for chest pain (x 3 doses).   . Omega-3 Fatty Acids (FISH OIL) 1000 MG CAPS Take 4 capsules (4,000 mg total) by mouth daily.  [name] triamcinolone cream (KENALOG) 0.5 % Apply 1 application topically as needed for irritation.     Allergies:   Rosuvastatin and Zetia [ezetimibe]   Social History   Tobacco Use  . Smoking status: Never Smoker  . Smokeless tobacco: Never Used  Substance Use Topics  . Alcohol use: No  . Drug use: No     Family Hx: The patient's family history includes Heart attack in his father and mother. There is no history of Stroke.  ROS:   Please see the history of present illness.    ROS All other systems reviewed and are negative.   EKGs/Labs/Other Test Reviewed:    EKG:  EKG is   ordered today.  The ekg ordered today demonstrates sinus bradycardia, HR 59, septal Q waves, QTC 405, no change since 04/11/17  Recent Labs: 09/16/2017: ALT 21; BUN 23; Creatinine, Ser 1.02; Hemoglobin 13.1; Magnesium 2.0; Platelets 229; Potassium 4.1; Sodium 140   Recent Lipid Panel Lab Results  Component Value Date/Time   CHOL 152 08/01/2017 08:05 AM   TRIG 133 08/01/2017 08:05 AM   HDL 36 (L) 08/01/2017 08:05 AM   CHOLHDL 4.2 08/01/2017 08:05 AM   CHOLHDL 3.5 05/30/2016 02:01 AM   LDLCALC 89 08/01/2017 08:05 AM   LDLDIRECT 83.0 10/28/2014 04:22 PM    Physical Exam:    VS:  BP 132/70   Pulse (!) 59   Ht 6\' 2"  (1.88 m)  Wt 216 lb 1.9 oz (98 kg)   SpO2 96%   BMI 27.75 kg/m     Wt Readings from Last 3 Encounters:  10/31/17 216 lb 1.9 oz (98 kg)  09/22/17 220 lb (99.8 kg)  04/11/17 214 lb 12.8 oz (97.4 kg)     Physical Exam  Constitutional: He is oriented to person, place, and time. He appears well-developed and well-nourished. No distress.  HENT:  Head: Normocephalic and atraumatic.  Neck: No JVD present.  Cardiovascular: Normal rate and regular rhythm.  No murmur heard. Pulmonary/Chest: Effort normal. He has no rales.  Abdominal: Soft.  Musculoskeletal: He exhibits no edema.  Neurological: He is alert and oriented to person, place, and time.  Skin: Skin is warm and dry.    ASSESSMENT & PLAN:    #1.  Fatigue  He notes significant issues with fatigue for quite some time now.  He denies any chest discomfort.  His ECG is unchanged.  Recent echo demonstrated normal LV function.  Question if his symptoms could be an anginal equivalent.  Therefore, I have suggested that we proceed with stress testing to rule out significant ischemia.  Recent hemoglobin was normal.  I do not see a TSH in quite some time.  Question if his symptoms may be related to beta-blocker.  -Arrange a Lexiscan Myoview  -I would like him to get his Myoview before his colonoscopy  -If no significant  ischemia on Myoview, consider a trial off of metoprolol  -Arrange TSH  -Follow-up 3 months, sooner if needed  #2.  Paroxysmal atrial fibrillation (HCC) Maintaining normal sinus rhythm.  He has a colonoscopy coming up.  Our pharmacist has already looked at his chart and recommended holding Eliquis for 24 hours prior to his procedure and resuming it as soon as possible after his procedure given his prior history of stroke.  #3.  Coronary artery disease of native artery of native heart with stable angina pectoris (HCC) Prior history of MI in 2004 treated with stenting to the LAD and RCA and subsequent MI in 2016 treated with stenting of the LAD and LCx.  Nuclear stress test in 2017 demonstrated anteroseptal scar but no ischemia.  As noted, he has not had chest pain.  However, he does note fatigue that has been persistent and is possibly worsening.  Proceed with stress testing as outlined.  Continue PCSK-9 inhibitor therapy, amlodipine, nitrates, beta-blocker.  #4.  Essential hypertension He is concerned about the recall on losartan.  His lisinopril was changed to losartan a couple of years ago secondary to a rash.  There was no change in his rash with the change in his blood pressure medication.  He has seen a dermatologist and uses a cream for this.  Overall, I did believe that he would be fine to resume lisinopril.  -DC losartan  -Start lisinopril 20 mg daily  -BMET 2 weeks  #5.  Pure hypercholesterolemia Continue PCSK-9 inhibitor therapy.   Dispo:  Return in about 3 months (around 01/30/2018) for Routine Follow Up, w/ Dr. Okey DupreEnd, or Tereso NewcomerScott Yanis Larin, PA-C.   Medication Adjustments/Labs and Tests Ordered: Current medicines are reviewed at length with the patient today.  Concerns regarding medicines are outlined above.  Tests Ordered: Orders Placed This Encounter  Procedures  . TSH  . Basic Metabolic Panel (BMET)  . Myocardial Perfusion Imaging  . EKG 12-Lead   Medication Changes: Meds ordered  this encounter  Medications  . lisinopril (PRINIVIL,ZESTRIL) 20 MG tablet    Sig: Take  1 tablet (20 mg total) by mouth daily.    Dispense:  90 tablet    Refill:  3    Signed, Tereso Newcomer, PA-C  10/31/2017 11:10 AM    [facility] [address] [place], [place], [place]  [address] Phone: [phone]; Fax: [phone]

## 2017-10-31 NOTE — Patient Instructions (Signed)
Medication Instructions:  1. STOP LOSARTAN  2. START LISINOPRIL 20 MG DAILY; RX HAS BEEN SENT IN  Labwork: IN 2 WEEKS BMET, TSH  Testing/Procedures: Your physician has requested that you have a lexiscan myoview. For further information please visit [email or web address]. Please follow instruction sheet, as given.    Follow-Up: 3 MONTHS WITH DR. END OR SCOTT WEAVER, PAC   Any Other Special Instructions Will Be Listed Below (If Applicable).  CALL IF YOUR BLOOD PRESSURE IS CONSISTENTLY 130/80 OR HIGHER   If you need a refill on your cardiac medications before your next appointment, please call your pharmacy.

## 2017-11-10 ENCOUNTER — Telehealth (HOSPITAL_COMMUNITY): Payer: Self-pay | Admitting: *Deleted

## 2017-11-10 NOTE — Telephone Encounter (Signed)
Patient given detailed instructions per Myocardial Perfusion Study Information Sheet for the test on 11/14/17. Patient notified to arrive 15 minutes early and that it is imperative to arrive on time for appointment to keep from having the test rescheduled.  If you need to cancel or reschedule your appointment, please call the office within 24 hours of your appointment. . Patient verbalized understanding. Lucely Leard Jacqueline    

## 2017-11-14 ENCOUNTER — Other Ambulatory Visit: Payer: Medicare Other | Admitting: *Deleted

## 2017-11-14 ENCOUNTER — Ambulatory Visit (HOSPITAL_COMMUNITY): Payer: Medicare Other | Attending: Cardiology

## 2017-11-14 DIAGNOSIS — I10 Essential (primary) hypertension: Secondary | ICD-10-CM

## 2017-11-14 DIAGNOSIS — I25118 Atherosclerotic heart disease of native coronary artery with other forms of angina pectoris: Secondary | ICD-10-CM | POA: Insufficient documentation

## 2017-11-14 DIAGNOSIS — R5383 Other fatigue: Secondary | ICD-10-CM

## 2017-11-14 DIAGNOSIS — I480 Paroxysmal atrial fibrillation: Secondary | ICD-10-CM

## 2017-11-14 LAB — BASIC METABOLIC PANEL
BUN / CREAT RATIO: 23 (ref 10–24)
BUN: 25 mg/dL (ref 8–27)
CO2: 18 mmol/L — ABNORMAL LOW (ref 20–29)
CREATININE: 1.09 mg/dL (ref 0.76–1.27)
Calcium: 9.8 mg/dL (ref 8.6–10.2)
Chloride: 105 mmol/L (ref 96–106)
GFR calc Af Amer: 74 mL/min/{1.73_m2} (ref >59)
GFR, EST NON AFRICAN AMERICAN: 64 mL/min/{1.73_m2} (ref >59)
Glucose: 104 mg/dL — ABNORMAL HIGH (ref 65–99)
Potassium: 4.3 mmol/L (ref 3.5–5.2)
Sodium: 139 mmol/L (ref 134–144)

## 2017-11-14 LAB — MYOCARDIAL PERFUSION IMAGING
CHL CUP MPHR: 142 {beats}/min
CHL CUP NUCLEAR SDS: 2
CSEPED: 6 min
Estimated workload: 7 METS
LHR: 0.4
LVDIAVOL: 121 mL (ref 62–150)
LVSYSVOL: 56 mL
NUC STRESS TID: 0.93
Peak HR: 131 {beats}/min
Percent HR: 92 %
RPE: 18
Rest HR: 61 {beats}/min
SRS: 12
SSS: 14

## 2017-11-14 LAB — TSH: TSH: 2.52 u[IU]/mL (ref 0.450–4.500)

## 2017-11-14 MED ORDER — TECHNETIUM TC 99M TETROFOSMIN IV KIT
32.4000 | PACK | Freq: Once | INTRAVENOUS | Status: AC | PRN
  Administered 2017-11-14: 32.4 via INTRAVENOUS
  Filled 2017-11-14: qty 33

## 2017-11-14 MED ORDER — TECHNETIUM TC 99M TETROFOSMIN IV KIT
10.7000 | PACK | Freq: Once | INTRAVENOUS | Status: AC | PRN
  Administered 2017-11-14: 10.7 via INTRAVENOUS
  Filled 2017-11-14: qty 11

## 2017-11-16 ENCOUNTER — Telehealth: Payer: Self-pay | Admitting: *Deleted

## 2017-11-16 NOTE — Telephone Encounter (Signed)
Pt has been notified of both lab and Myoview results by phone with verbal understanding. Pt agreeable to plan of care with the Metoprolol Tart to decrease to 12.5 mg once daily x 3 days then stop. Pt advised if fatigue does improve off of the Metoprolol Tart then remain off the Metoprolol Tart. Pt advised if fatigue does not improve after 2 weeks of stopping the Metoprolol then he is to resume Metoprolol Tart 12.5 mg BID. I will fax a copy of both lab and Myoview to [name], [place]. Pt did also ask if he had been cleared for his colonoscopy. I advised pt that I will d/w PA and call back later to let him know if cleared or not. Pt thanked me for all of my help and my call today.

## 2017-11-16 NOTE — Telephone Encounter (Signed)
PT CLEARED FOR COLONOSCOPY. Lmom cleared for colonoscopy. Will need to hold Eliquis 24 hours prior to procedure. I will fax clearance to  GI. If any questions please feel free to call the office

## 2017-11-16 NOTE — Telephone Encounter (Signed)
Recent stress test was low risk and neg for ischemia.  He may proceed with colonoscopy as planned. Pharmacy reviewed his chart and made the following recommendations for his anticoagulation (Eliquis): "Per office protocol, patient can hold Eliquis for 24 hours prior to procedure.  Given history of stroke would recommend resume Eliquis as soon as safe after procedure." Tereso Newcomer, PA-C    11/16/2017 4:33 PM

## 2017-12-17 HISTORY — PX: COLONOSCOPY W/ POLYPECTOMY: SHX1380

## 2017-12-26 ENCOUNTER — Encounter (HOSPITAL_COMMUNITY)
Admission: AD | Disposition: A | Discharge status: Home / Self Care | Payer: Self-pay | Source: Other Acute Inpatient Hospital | Attending: Internal Medicine

## 2017-12-26 ENCOUNTER — Other Ambulatory Visit: Payer: Self-pay

## 2017-12-26 ENCOUNTER — Inpatient Hospital Stay (HOSPITAL_COMMUNITY)
Admission: AD | Admit: 2017-12-26 | Discharge: 2017-12-28 | DRG: 919 | Disposition: A | Discharge status: Home / Self Care | Payer: Medicare Other | Source: Other Acute Inpatient Hospital | Attending: Internal Medicine | Admitting: Internal Medicine

## 2017-12-26 ENCOUNTER — Encounter (HOSPITAL_COMMUNITY): Payer: Self-pay | Admitting: General Practice

## 2017-12-26 DIAGNOSIS — I252 Old myocardial infarction: Secondary | ICD-10-CM

## 2017-12-26 DIAGNOSIS — I2510 Atherosclerotic heart disease of native coronary artery without angina pectoris: Secondary | ICD-10-CM | POA: Diagnosis present

## 2017-12-26 DIAGNOSIS — N179 Acute kidney failure, unspecified: Secondary | ICD-10-CM | POA: Diagnosis present

## 2017-12-26 DIAGNOSIS — Z79899 Other long term (current) drug therapy: Secondary | ICD-10-CM | POA: Diagnosis not present

## 2017-12-26 DIAGNOSIS — K633 Ulcer of intestine: Secondary | ICD-10-CM | POA: Diagnosis present

## 2017-12-26 DIAGNOSIS — Z8249 Family history of ischemic heart disease and other diseases of the circulatory system: Secondary | ICD-10-CM

## 2017-12-26 DIAGNOSIS — I480 Paroxysmal atrial fibrillation: Secondary | ICD-10-CM | POA: Diagnosis present

## 2017-12-26 DIAGNOSIS — I119 Hypertensive heart disease without heart failure: Secondary | ICD-10-CM | POA: Diagnosis present

## 2017-12-26 DIAGNOSIS — K922 Gastrointestinal hemorrhage, unspecified: Secondary | ICD-10-CM | POA: Diagnosis not present

## 2017-12-26 DIAGNOSIS — K625 Hemorrhage of anus and rectum: Secondary | ICD-10-CM | POA: Diagnosis not present

## 2017-12-26 DIAGNOSIS — R578 Other shock: Secondary | ICD-10-CM | POA: Diagnosis present

## 2017-12-26 DIAGNOSIS — E1159 Type 2 diabetes mellitus with other circulatory complications: Secondary | ICD-10-CM | POA: Diagnosis present

## 2017-12-26 DIAGNOSIS — K921 Melena: Secondary | ICD-10-CM | POA: Diagnosis present

## 2017-12-26 DIAGNOSIS — Z794 Long term (current) use of insulin: Secondary | ICD-10-CM

## 2017-12-26 DIAGNOSIS — Z888 Allergy status to other drugs, medicaments and biological substances status: Secondary | ICD-10-CM

## 2017-12-26 DIAGNOSIS — I25118 Atherosclerotic heart disease of native coronary artery with other forms of angina pectoris: Secondary | ICD-10-CM | POA: Diagnosis not present

## 2017-12-26 DIAGNOSIS — Z9861 Coronary angioplasty status: Secondary | ICD-10-CM

## 2017-12-26 DIAGNOSIS — Z981 Arthrodesis status: Secondary | ICD-10-CM

## 2017-12-26 DIAGNOSIS — Z86010 Personal history of colonic polyps: Secondary | ICD-10-CM | POA: Diagnosis not present

## 2017-12-26 DIAGNOSIS — K5791 Diverticulosis of intestine, part unspecified, without perforation or abscess with bleeding: Secondary | ICD-10-CM | POA: Diagnosis not present

## 2017-12-26 DIAGNOSIS — R42 Dizziness and giddiness: Secondary | ICD-10-CM

## 2017-12-26 DIAGNOSIS — K5730 Diverticulosis of large intestine without perforation or abscess without bleeding: Secondary | ICD-10-CM | POA: Diagnosis present

## 2017-12-26 DIAGNOSIS — Y838 Other surgical procedures as the cause of abnormal reaction of the patient, or of later complication, without mention of misadventure at the time of the procedure: Secondary | ICD-10-CM | POA: Diagnosis present

## 2017-12-26 DIAGNOSIS — Z8673 Personal history of transient ischemic attack (TIA), and cerebral infarction without residual deficits: Secondary | ICD-10-CM | POA: Diagnosis not present

## 2017-12-26 DIAGNOSIS — I10 Essential (primary) hypertension: Secondary | ICD-10-CM | POA: Diagnosis not present

## 2017-12-26 DIAGNOSIS — Z7901 Long term (current) use of anticoagulants: Secondary | ICD-10-CM | POA: Diagnosis not present

## 2017-12-26 DIAGNOSIS — K648 Other hemorrhoids: Secondary | ICD-10-CM | POA: Diagnosis present

## 2017-12-26 DIAGNOSIS — K91840 Postprocedural hemorrhage and hematoma of a digestive system organ or structure following a digestive system procedure: Principal | ICD-10-CM | POA: Diagnosis present

## 2017-12-26 DIAGNOSIS — E7800 Pure hypercholesterolemia, unspecified: Secondary | ICD-10-CM | POA: Diagnosis present

## 2017-12-26 DIAGNOSIS — E785 Hyperlipidemia, unspecified: Secondary | ICD-10-CM | POA: Diagnosis present

## 2017-12-26 DIAGNOSIS — I959 Hypotension, unspecified: Secondary | ICD-10-CM

## 2017-12-26 DIAGNOSIS — R55 Syncope and collapse: Secondary | ICD-10-CM

## 2017-12-26 DIAGNOSIS — E119 Type 2 diabetes mellitus without complications: Secondary | ICD-10-CM | POA: Diagnosis present

## 2017-12-26 DIAGNOSIS — D62 Acute posthemorrhagic anemia: Secondary | ICD-10-CM | POA: Diagnosis present

## 2017-12-26 DIAGNOSIS — Z955 Presence of coronary angioplasty implant and graft: Secondary | ICD-10-CM

## 2017-12-26 HISTORY — PX: COLONOSCOPY: SHX5424

## 2017-12-26 LAB — CBC
HCT: 34 % — ABNORMAL LOW (ref 39.0–52.0)
Hemoglobin: 11.6 g/dL — ABNORMAL LOW (ref 13.0–17.0)
MCH: 31.3 pg (ref 26.0–34.0)
MCHC: 34.1 g/dL (ref 30.0–36.0)
MCV: 91.6 fL (ref 78.0–100.0)
Platelets: 229 10*3/uL (ref 150–400)
RBC: 3.71 MIL/uL — ABNORMAL LOW (ref 4.22–5.81)
RDW: 12.1 % (ref 11.5–15.5)
WBC: 11.7 10*3/uL — ABNORMAL HIGH (ref 4.0–10.5)

## 2017-12-26 LAB — CBC WITH DIFFERENTIAL/PLATELET
Abs Immature Granulocytes: 0 10*3/uL (ref 0.0–0.1)
Basophils Absolute: 0 10*3/uL (ref 0.0–0.1)
Basophils Relative: 0 %
Eosinophils Absolute: 0.2 10*3/uL (ref 0.0–0.7)
Eosinophils Relative: 3 %
HCT: 22.2 % — ABNORMAL LOW (ref 39.0–52.0)
Hemoglobin: 7.4 g/dL — ABNORMAL LOW (ref 13.0–17.0)
Immature Granulocytes: 0 %
Lymphocytes Relative: 17 %
Lymphs Abs: 1.1 10*3/uL (ref 0.7–4.0)
MCH: 31 pg (ref 26.0–34.0)
MCHC: 33.3 g/dL (ref 30.0–36.0)
MCV: 92.9 fL (ref 78.0–100.0)
Monocytes Absolute: 0.4 10*3/uL (ref 0.1–1.0)
Monocytes Relative: 6 %
Neutro Abs: 4.7 10*3/uL (ref 1.7–7.7)
Neutrophils Relative %: 74 %
Platelets: 155 10*3/uL (ref 150–400)
RBC: 2.39 MIL/uL — ABNORMAL LOW (ref 4.22–5.81)
RDW: 11.8 % (ref 11.5–15.5)
WBC: 6.4 10*3/uL (ref 4.0–10.5)

## 2017-12-26 LAB — PROTIME-INR
INR: 1.78
Prothrombin Time: 20.5 seconds — ABNORMAL HIGH (ref 11.4–15.2)

## 2017-12-26 LAB — COMPREHENSIVE METABOLIC PANEL
ALT: 13 U/L — ABNORMAL LOW (ref 17–63)
AST: 15 U/L (ref 15–41)
Albumin: 3.4 g/dL — ABNORMAL LOW (ref 3.5–5.0)
Alkaline Phosphatase: 35 U/L — ABNORMAL LOW (ref 38–126)
Anion gap: 7 (ref 5–15)
BUN: 28 mg/dL — ABNORMAL HIGH (ref 6–20)
CO2: 22 mmol/L (ref 22–32)
Calcium: 9.1 mg/dL (ref 8.9–10.3)
Chloride: 108 mmol/L (ref 101–111)
Creatinine, Ser: 1.33 mg/dL — ABNORMAL HIGH (ref 0.61–1.24)
GFR calc Af Amer: 57 mL/min — ABNORMAL LOW (ref >60)
GFR calc non Af Amer: 49 mL/min — ABNORMAL LOW (ref >60)
Glucose, Bld: 237 mg/dL — ABNORMAL HIGH (ref 65–99)
Potassium: 4.6 mmol/L (ref 3.5–5.1)
Sodium: 137 mmol/L (ref 135–145)
Total Bilirubin: 0.5 mg/dL (ref 0.3–1.2)
Total Protein: 5.3 g/dL — ABNORMAL LOW (ref 6.5–8.1)

## 2017-12-26 LAB — GLUCOSE, CAPILLARY
GLUCOSE-CAPILLARY: 229 mg/dL — AB (ref 65–99)
Glucose-Capillary: 205 mg/dL — ABNORMAL HIGH (ref 65–99)
Glucose-Capillary: 131 mg/dL — ABNORMAL HIGH (ref 65–99)

## 2017-12-26 LAB — PREPARE RBC (CROSSMATCH)

## 2017-12-26 LAB — APTT: aPTT: 34 seconds (ref 24–36)

## 2017-12-26 SURGERY — COLONOSCOPY
Anesthesia: Moderate Sedation

## 2017-12-26 MED ORDER — SODIUM CHLORIDE 0.9 % IV SOLN: Freq: Once | INTRAVENOUS | Status: DC

## 2017-12-26 MED ORDER — ONDANSETRON HCL 4 MG/2ML IJ SOLN: 4.0000 mg | Freq: Four times a day (QID) | INTRAMUSCULAR | Status: DC | PRN

## 2017-12-26 MED ORDER — FENTANYL CITRATE (PF) 100 MCG/2ML IJ SOLN
INTRAMUSCULAR | Status: AC
  Filled 2017-12-26: qty 4

## 2017-12-26 MED ORDER — FENTANYL CITRATE (PF) 100 MCG/2ML IJ SOLN
INTRAMUSCULAR | Status: DC | PRN
  Administered 2017-12-26 (×2): 25 ug via INTRAVENOUS

## 2017-12-26 MED ORDER — DIPHENHYDRAMINE HCL 50 MG/ML IJ SOLN
INTRAMUSCULAR | Status: AC
Start: 2017-12-26 — End: ?
  Filled 2017-12-26: qty 1

## 2017-12-26 MED ORDER — MIDAZOLAM HCL 5 MG/ML IJ SOLN
INTRAMUSCULAR | Status: AC
  Filled 2017-12-26: qty 3

## 2017-12-26 MED ORDER — INSULIN ASPART 100 UNIT/ML ~~LOC~~ SOLN
0.0000 [IU] | Freq: Three times a day (TID) | SUBCUTANEOUS | Status: DC
  Administered 2017-12-26: 5 [IU] via SUBCUTANEOUS
  Administered 2017-12-27 – 2017-12-28 (×2): 3 [IU] via SUBCUTANEOUS

## 2017-12-26 MED ORDER — MIDAZOLAM HCL 10 MG/2ML IJ SOLN
INTRAMUSCULAR | Status: DC | PRN
  Administered 2017-12-26 (×2): 2 mg via INTRAVENOUS
  Administered 2017-12-26: 1 mg via INTRAVENOUS
  Administered 2017-12-26: 2 mg via INTRAVENOUS

## 2017-12-26 MED ORDER — ACETAMINOPHEN 325 MG PO TABS: 650.0000 mg | ORAL_TABLET | Freq: Four times a day (QID) | ORAL | Status: DC | PRN

## 2017-12-26 MED ORDER — ONDANSETRON HCL 4 MG PO TABS: 4.0000 mg | ORAL_TABLET | Freq: Four times a day (QID) | ORAL | Status: DC | PRN

## 2017-12-26 MED ORDER — INSULIN ASPART 100 UNIT/ML ~~LOC~~ SOLN: 0.0000 [IU] | Freq: Every day | SUBCUTANEOUS | Status: DC

## 2017-12-26 MED ORDER — SPOT INK MARKER SYRINGE KIT
PACK | SUBMUCOSAL | Status: AC
  Filled 2017-12-26: qty 5

## 2017-12-26 MED ORDER — ACETAMINOPHEN 650 MG RE SUPP: 650.0000 mg | Freq: Four times a day (QID) | RECTAL | Status: DC | PRN

## 2017-12-26 MED ORDER — EPINEPHRINE PF 1 MG/10ML IJ SOSY
PREFILLED_SYRINGE | INTRAMUSCULAR | Status: AC
  Filled 2017-12-26: qty 10

## 2017-12-26 MED ORDER — TRAMADOL HCL 50 MG PO TABS
50.0000 mg | ORAL_TABLET | Freq: Four times a day (QID) | ORAL | Status: DC | PRN
  Administered 2017-12-26 – 2017-12-27 (×2): 50 mg via ORAL
  Filled 2017-12-26 (×2): qty 1

## 2017-12-26 MED ORDER — INSULIN DETEMIR 100 UNIT/ML ~~LOC~~ SOLN
15.0000 [IU] | Freq: Two times a day (BID) | SUBCUTANEOUS | Status: DC
  Administered 2017-12-27 – 2017-12-28 (×3): 15 [IU] via SUBCUTANEOUS
  Filled 2017-12-26 (×5): qty 0.15

## 2017-12-26 MED ORDER — SODIUM CHLORIDE 0.9 % IV SOLN
INTRAVENOUS | Status: DC
  Administered 2017-12-26 – 2017-12-28 (×5): via INTRAVENOUS

## 2017-12-26 MED ORDER — SODIUM CHLORIDE 0.9 % IV SOLN
Freq: Once | INTRAVENOUS | Status: AC
  Administered 2017-12-26: 17:00:00 via INTRAVENOUS

## 2017-12-26 NOTE — Interval H&P Note (Signed)
History and Physical Interval Note:  12/26/2017 5:27 PM  Joseph [name]  has presented today for surgery, with the diagnosis of lower GI bleeding  The various methods of treatment have been discussed with the patient and family. After consideration of risks, benefits and other options for treatment, the patient has consented to  Procedure(s) with comments: COLONOSCOPY (N/A) - patient going to stepdown. [facility] .  The patient's history has been reviewed, patient examined, no change in status, stable for surgery.  I have reviewed the patient's chart and labs.  Questions were answered to the patient's satisfaction.     Viviann SpareSteven P Armbruster

## 2017-12-26 NOTE — Progress Notes (Signed)
Patient arrived to unit [facility]. Family and patient oriented to unit and placed on bedside monitor. First unit of blood currently running. Fluids started per order. Awaiting physician for colonoscopy.

## 2017-12-26 NOTE — H&P (Signed)
History and Physical    Joseph [name] ZOX:[id number] DOB: 1941/02/19 DOA: 12/26/2017  PCP: Dema Severin, NP Consultants:  GI Patient coming from:  Home - lives with wife  Chief Complaint: GI bleed  HPI: Joseph [name] is a 77 y.o. male with medical history significant for DM2, HTN, HLD, CAD s/p MI 2004, PAF on chronic anticoagulation who presents as a transfer from [place] for [employer]. He underwent routine colonoscopy on 5/29 during which 5 polyps were removed which were all less than 5 mm. He did well until about 5 days ago when he started feeling general malaise; denies actual pain, nausea, fatigue, lightheadedness, dyspnea but just "didn't feel right." Yesterday morning had BM that was spotted with BRB on the tissue. This occurred once or twice more yesterday, with no loose stools. This morning after eating cereal and having coffee around 0730 he had an intense urge to have a BM and almost was not continent of a large loose bloody BM. He describes this as mostly blood with small amount of stool present. He has since had recurrence of the BRB approximately every 30 minutes, for a total of 7 - 8 episodes today, including while at [facility] prior to transfer here, and again on the floor here. He has been feeling lightheaded today. He had a fleeting episode of mid abdominal pain a few days ago but has since had none. No nausea. No headache. No dyspnea or chest pain. He had a large bloody BM after arrival to his room here and had a presyncopal episode. He did not fall as he was sitting on the toilet and nurses were with him. BP at the time was 67/42. RR was called and he was given 1L NS bolus with improvement of his BP to 101/64 and then 126/68. He reports he did take his BP meds as well as his Eliquis this morning. He is currently not experiencing any discomfort. He does feel like he will need to have another BM soon.  Pt denies ever having any prior GI bleeding episodes. Denies a h/o PUD, gastritis, or  other GI issues.  Dr. Charm Barges is his gastroenterologist who sent him to the ED this morning. He did have diverticulosis seen on his most recent colonoscopy. At [facility], his BP was high at 192/89. Hgb was 14.6, platelets 245. INR 1.1. PTT 25.2. LFTs WNL. WBC 9. Creat 1.1.   Review of Systems: As per HPI; otherwise review of systems reviewed and negative.   Ambulatory Status:  Ambulates without assistance  Past Medical History:  Diagnosis Date  . Abdominal discomfort    Abdominal discomfort: EGD 2011 showed no significant abnormalities.  Possibly due to metformin.  . Allergic rhinitis   . Anticoagulated 05/30/2016  . CAD (coronary artery disease)    a. s/p MI in 2004:  stents to the LAD and staged stent RCA, EF 55%;  b.  NSTEMI (1/16):  LHC - mid LAD stent with diff dist restenosis, prox to mid Dx mod dsz jailed by stent, small OM1 99, mCFX 75, dCFX 80, mRCA stent ok, mPDA mild to mod dsz, EF 55% >> PCI:  Synergy DES to LAD; Synergy DES (x2) mid and dist CFX  . 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  s/p back surgery  . DM (diabetes mellitus) (HCC)   . History of CVA (cerebrovascular accident)    Small CVA seen by head CT (incidental) in 1/13.  Carotid  dopplers in 1/13 showed minimal disease  . History of Doppler ultrasound    Arterial dopplers (3/11): no evidence for significant PAD. ABIs 10/12: Normal.    . HLD (hyperlipidemia)   . HTN (hypertension)    had side effects with chlorthalidone  . Hx of cardiovascular stress test    Myoview (1/13): Small fixed apical septal defect with no ischemia, EF 58%.  [name] Hx of echocardiogram    Echo (3/11): EF 60%, normal wall motion, mild MR, mild left atrial enlargement, mildly dilated ascending aorta.  [name] PAF (paroxysmal atrial fibrillation) (HCC) 05/29/2016  . Spondylolisthesis    L4-S1    Past Surgical History:  Procedure Laterality Date  . CORONARY ANGIOGRAM  11/13/2014    Procedure: CORONARY ANGIOGRAM;  Surgeon: Laurey Morale, MD;  Location: [facility] CATH LAB;  Service: Cardiovascular;;  . CORONARY STENT PLACEMENT  2004   x2 LAD and RCA   . LEFT HEART CATHETERIZATION WITH CORONARY ANGIOGRAM N/A 08/02/2014   Procedure: LEFT HEART CATHETERIZATION WITH CORONARY ANGIOGRAM;  Surgeon: Corky Crafts, MD;  Location: [facility] CATH LAB;  Service: Cardiovascular;  Laterality: N/A;  . PROSTATE SURGERY    . SPINAL FUSION     L4-S1    Social History   Socioeconomic History  . Marital status: Divorced    Spouse name: Not on file  . Number of children: 2  . Years of education: Not on file  . Highest education level: Not on file  Occupational History  . Occupation: retired  Engineer, production  . Financial resource strain: Not on file  . Food insecurity:    Worry: Not on file    Inability: Not on file  . Transportation needs:    Medical: Not on file    Non-medical: Not on file  Tobacco Use  . Smoking status: Never Smoker  . Smokeless tobacco: Never Used  Substance and Sexual Activity  . Alcohol use: No  . Drug use: No  . Sexual activity: Not on file  Lifestyle  . Physical activity:    Days per week: Not on file    Minutes per session: Not on file  . Stress: Not on file  Relationships  . Social connections:    Talks on phone: Not on file    Gets together: Not on file    Attends religious service: Not on file    Active member of club or organization: Not on file    Attends meetings of clubs or organizations: Not on file    Relationship status: Not on file  . Intimate partner violence:    Fear of current or ex partner: Not on file    Emotionally abused: Not on file    Physically abused: Not on file    Forced sexual activity: Not on file  Other Topics Concern  . Not on file  Social History Narrative  . Not on file    Allergies  Allergen Reactions  . Rosuvastatin Other (See Comments)    Low energy/leg and hip pain  . Zetia [Ezetimibe]     myalgias     Family History  Problem Relation Age of Onset  . Heart attack Mother   . Heart attack Father   . Stroke Neg Hx     Prior to Admission medications   Medication Sig Start Date End Date Taking? Authorizing Provider  acetaminophen (TYLENOL) 650 MG CR tablet Take 1,300 mg by mouth every 8 (eight) hours as needed for pain.    [provider]  amLODipine (NORVASC) 5 MG tablet Take 1 tablet (5 mg total) by mouth daily. 05/31/16   Leone Brand, NP  apixaban (ELIQUIS) 5 MG TABS tablet Take 1 tablet (5 mg total) by mouth 2 (two) times daily. 07/29/17   End, Cristal Deer, MD  Coenzyme Q10 100 MG capsule Take 1 capsule by mouth daily.    [provider]  fluticasone (FLONASE) 50 MCG/ACT nasal spray Place 2 sprays into both nostrils daily as needed for allergies or rhinitis.  03/31/15   [provider]  glipiZIDE (GLUCOTROL) 10 MG tablet Take 10 mg by mouth 2 (two) times daily before a meal.      [provider]  Insulin Detemir (LEVEMIR FLEXTOUCH) 100 UNIT/ML Pen Inject 20 Units into the skin 2 (two) times daily before a meal.    [provider]  isosorbide mononitrate (IMDUR) 60 MG 24 hr tablet Take 120 mg by mouth daily.    [provider]  lisinopril (PRINIVIL,ZESTRIL) 20 MG tablet Take 1 tablet (20 mg total) by mouth daily. 10/31/17 01/29/18  Tereso Newcomer T, PA-C  metoprolol (LOPRESSOR) 25 MG tablet Take 0.5 tablets (12.5 mg total) by mouth 2 (two) times daily. 08/03/14   Tereso Newcomer T, PA-C  nitroGLYCERIN (NITROSTAT) 0.4 MG SL tablet Place 0.4 mg under the tongue every 5 (five) minutes as needed for chest pain (x 3 doses).     [provider]  Omega-3 Fatty Acids (FISH OIL) 1000 MG CAPS Take 4 capsules (4,000 mg total) by mouth daily. 06/03/16   Bhagat, Sharrell Ku, PA  triamcinolone cream (KENALOG) 0.5 % Apply 1 application topically as needed for irritation. 08/24/17   [provider]    Physical Exam: Vitals:   12/26/17  1300 12/26/17 1310 12/26/17 1320 12/26/17 1352  BP: 98/60 101/64 112/62 126/68  Pulse: 66 (!) 59 62 63  Resp:  18    SpO2:  100%  99%     . General:  Appears calm and comfortable and is NAD . Eyes:  PERRL, EOMI, normal lids, iris . ENT:  grossly normal hearing, lips & tongue, mmm; appropriate dentition . Neck:  no LAD, masses or thyromegaly; no carotid bruits . Cardiovascular:  RRR, no m/r/g. No LE edema.  [name] Respiratory:   CTA bilaterally with no wheezes/rales/rhonchi.  Normal respiratory effort. . Abdomen:  soft, ND, NABS, mildly tender to palpation in periumbilical region, no R/G. No hepatomegaly. . Back:   normal alignment, no CVAT . Skin:  no rash or induration seen on limited exam . Musculoskeletal:  grossly normal tone BUE/BLE, good ROM, no bony abnormality . Lower extremity:  No LE edema. Mild limited area of pinpoint petechiae encircling both ankles. Limited foot exam with no ulcerations.  2+ distal pulses. [name] Psychiatric:  grossly normal mood and affect, speech fluent and appropriate, AOx3 . Neurologic:  CN 2-12 grossly intact, moves all extremities in coordinated fashion, sensation intact    Radiological Exams on Admission: No results found.    Labs on Admission: pending; see above for pertinent labs from [facility]     Assessment/Plan Principal Problem:   GI bleed Active Problems:   Type 2 diabetes mellitus with vascular disease (HCC)   Pure hypercholesterolemia   Essential hypertension   CAD S/P percutaneous coronary angioplasty   Paroxysmal atrial fibrillation (HCC)   Anticoagulated   Coronary artery disease of native artery of native heart with stable angina pectoris (HCC)   Hypotension   Postural dizziness with presyncope  GI bleed: this is likely diverticular although bleeding from polypectomy site(s) not ruled out. The timing does suggest possible bleed from procedure but would likely not produce this significant of a bleed. He is hemodynamically  unstable requiring IVF for BP support. Doubt upper source; no h/o PUD, lack of symptoms, no h/o liver disease or stigmata of liver disease on exam; LFTs are normal. -Typed and screened -Consented for blood -GI contacted ([facility]), appreciate assistance -has finished 1L NS with good response. Cont NS at 150 cc/h, reduce rate if any resp compromise -Maintain 2 large bore PIVs -Hold Eliquis -CBC q6h, transfuse as needed based on hemodynamics and Hgb trend -keep NPO for anticipated scope -transfer to stepdown unit  Presyncope: 2/2 GIB, hypotension -cont to closely monitor VS -cont IVF at 150 cc/hour, reduce rate or d/c IVF as needed  H/o CAD: currently stable. No h/o CHF but caution with IVF -EKG, enzymes prn chest pain -transfuse as needed  DM2:  -cont levemir at 1/2 home dose while NPO (15 units BID as pt takes 30 units BID at home) -q4H CBG with SSI -hold glipizide while in-house  PAF: currently in NSR -holding Eliquis  HTN: Hold all antihypertensives; resume when needed    DVT prophylaxis: SCDs (anticoag contraindicated given acute bleeding) Code Status:  Full - confirmed with patient/family Family CommunicationKathrin Penner:  Patterson,Phoebe Daughter [phone]  [phone]  Disposition Plan:  Home once clinically improved Consults called: [facility]  Admission status: Admit - It is my clinical opinion that admission to INPATIENT is reasonable and necessary because of the expectation that this patient will require hospital care that crosses at least 2 midnights to treat this condition based on the medical complexity of the problems presented.  Given the aforementioned information, the predictability of an adverse outcome is felt to be significant.    Ellis ParentsSally B Emogene Muratalla MD [facility]  If note is complete, please contact covering daytime or nighttime physician. [email or web address] Password [email or web address]  12/26/2017, 2:14 PM

## 2017-12-26 NOTE — H&P (View-Only) (Signed)
Referring Provider: [facility]    Primary Care Physician:  Dema SeverinYork, Regina F, NP Primary Gastroenterologist:  Dr. Charm BargesButler,  MD  Reason for Consultation:   Lower GI bleeding   ASSESSMENT AND PLAN:    1. 77 yo male [facility] with painless hematochezia on Eliquis. Became hemodynamically unstable after arrival. Rule out post-polypectomy bleed vrs diverticular bleed. Multiple polyps removed on colonoscopy 12/14/17. Spoke with Dr. Charm BargesButler and some were cauterized. He had diverticulosis apparently, though not well described on the report.  -Discussed with Hospitalist who will order blood transfusion now.  -CTA vrs unprepped colonoscopy today at bedside. After speaking with Dr. Charm BargesButler it sounds like there is a possibility that bleeding is from polypectomy so will likely proceed with colonoscopy when patient gets to Stepdown.  -I spoke with Pharm about reversal agent for Eliquis. It is available if needed for life threatening bleeding. Pharmacist # [phone] -keep NPO  2. Anemia of acute blood loss. [facility] mid [facility], now mid 7  -Hospitalist ordering blood transfusion.   3. AKI, Cr 1.33, up from baseline of 1.09   HPI: Joseph [name] is a 77 y.o. male with CAD / stents, hx of PAF, DM, and CVA [facility] with lower GI bleeding on Eliquis. Patient is s/p colonoscopy with removal of several small polyps (some with cautery) on 12/14/17 in [place] by Dr. Charm BargesButler.  He resumed Eliquis one day post procedure.  Yesterday patient began seeing spots of blood in stool and today began having large volume hematochezia. [facility], subsequently became hypotensive with near syncopal episode . BP responded to fluids. [facility] was mid 14, now at 7.4. Last Eliquis dose was this am. Patient has no abdominal pain but is tender in RLQ.  No N/V.     Past Medical History:  Diagnosis Date  .  Abdominal discomfort    Abdominal discomfort: EGD 2011 showed no significant abnormalities.  Possibly due to metformin.  . Allergic rhinitis   . Anticoagulated 05/30/2016  . CAD (coronary artery disease)    a. s/p MI in 2004:  stents to the LAD and staged stent RCA, EF 55%;  b.  NSTEMI (1/16):  LHC - mid LAD stent with diff dist restenosis, prox to mid Dx mod dsz jailed by stent, small OM1 99, mCFX 75, dCFX 80, mRCA stent ok, mPDA mild to mod dsz, EF 55% >> PCI:  Synergy DES to LAD; Synergy DES (x2) mid and dist CFX  . 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  s/p back surgery  . DM (diabetes mellitus) (HCC)   . History of CVA (cerebrovascular accident)    Small CVA seen by head CT (incidental) in 1/13.  Carotid dopplers in 1/13 showed minimal disease  . History of Doppler ultrasound    Arterial dopplers (3/11): no evidence for significant PAD. ABIs 10/12: Normal.    . HLD (hyperlipidemia)   . HTN (hypertension)    had side effects with chlorthalidone  . Hx of cardiovascular stress test    Myoview (1/13): Small fixed apical septal defect with no ischemia, EF 58%.  [name]. Hx of echocardiogram    Echo (3/11): EF 60%, normal wall motion, mild MR, mild left atrial enlargement, mildly dilated ascending aorta.  [name]. PAF (paroxysmal atrial fibrillation) (HCC) 05/29/2016  . Spondylolisthesis  L4-S1    Past Surgical History:  Procedure Laterality Date  . COLONOSCOPY W/ POLYPECTOMY  12/2017  . CORONARY ANGIOGRAM  11/13/2014   Procedure: CORONARY ANGIOGRAM;  Surgeon: Laurey Morale, MD;  Location: [facility] CATH LAB;  Service: Cardiovascular;;  . CORONARY STENT PLACEMENT  2004   x2 LAD and RCA   . LEFT HEART CATHETERIZATION WITH CORONARY ANGIOGRAM N/A 08/02/2014   Procedure: LEFT HEART CATHETERIZATION WITH CORONARY ANGIOGRAM;  Surgeon: Corky Crafts, MD;  Location: [facility] CATH LAB;  Service: Cardiovascular;  Laterality: N/A;  . PROSTATE SURGERY     . SPINAL FUSION     L4-S1    Prior to Admission medications   Medication Sig Start Date End Date Taking? Authorizing Provider  acetaminophen (TYLENOL) 650 MG CR tablet Take 1,300 mg by mouth every 8 (eight) hours as needed for pain.    [provider]  amLODipine (NORVASC) 5 MG tablet Take 1 tablet (5 mg total) by mouth daily. 05/31/16   Leone Brand, NP  apixaban (ELIQUIS) 5 MG TABS tablet Take 1 tablet (5 mg total) by mouth 2 (two) times daily. 07/29/17   End, Cristal Deer, MD  Coenzyme Q10 100 MG capsule Take 1 capsule by mouth daily.    [provider]  fluticasone (FLONASE) 50 MCG/ACT nasal spray Place 2 sprays into both nostrils daily as needed for allergies or rhinitis.  03/31/15   [provider]  glipiZIDE (GLUCOTROL) 10 MG tablet Take 10 mg by mouth 2 (two) times daily before a meal.      [provider]  Insulin Detemir (LEVEMIR FLEXTOUCH) 100 UNIT/ML Pen Inject 20 Units into the skin 2 (two) times daily before a meal.    [provider]  isosorbide mononitrate (IMDUR) 60 MG 24 hr tablet Take 120 mg by mouth daily.    [provider]  lisinopril (PRINIVIL,ZESTRIL) 20 MG tablet Take 1 tablet (20 mg total) by mouth daily. 10/31/17 01/29/18  Tereso Newcomer T, PA-C  metoprolol (LOPRESSOR) 25 MG tablet Take 0.5 tablets (12.5 mg total) by mouth 2 (two) times daily. 08/03/14   Tereso Newcomer T, PA-C  nitroGLYCERIN (NITROSTAT) 0.4 MG SL tablet Place 0.4 mg under the tongue every 5 (five) minutes as needed for chest pain (x 3 doses).     [provider]  Omega-3 Fatty Acids (FISH OIL) 1000 MG CAPS Take 4 capsules (4,000 mg total) by mouth daily. 06/03/16   Bhagat, Sharrell Ku, PA  triamcinolone cream (KENALOG) 0.5 % Apply 1 application topically as needed for irritation. 08/24/17   [provider]    Current Facility-Administered Medications  Medication Dose Route Frequency Provider Last Rate Last Dose  . 0.9 %  sodium  chloride infusion   Intravenous Once Cleatrice Burke B, MD      . 0.9 %  sodium chloride infusion   Intravenous Continuous Ellis Parents, MD      . acetaminophen (TYLENOL) tablet 650 mg  650 mg Oral Q6H PRN Ellis Parents, MD       Or  . acetaminophen (TYLENOL) suppository 650 mg  650 mg Rectal Q6H PRN Ellis Parents, MD      . insulin aspart (novoLOG) injection 0-15 Units  0-15 Units Subcutaneous TID WC Cleatrice Burke B, MD      . insulin aspart (novoLOG) injection 0-5 Units  0-5 Units Subcutaneous QHS Cleatrice Burke B, MD      . insulin detemir (LEVEMIR) injection 15 Units  15 Units Subcutaneous BID  Ellis Parents, MD      . ondansetron [facility]) tablet 4 mg  4 mg Oral Q6H PRN Ellis Parents, MD       Or  . ondansetron [facility]) injection 4 mg  4 mg Intravenous Q6H PRN Ellis Parents, MD      . traMADol Janean Sark) tablet 50 mg  50 mg Oral Q6H PRN Ellis Parents, MD        Allergies as of 12/26/2017 - Review Complete 12/26/2017  Allergen Reaction Noted  . Rosuvastatin Other (See Comments) 04/06/2017  . Zetia [ezetimibe]  08/02/2017    Family History  Problem Relation Age of Onset  . Heart attack Mother   . Heart attack Father   . Stroke Neg Hx     Social History   Socioeconomic History  . Marital status: Divorced    Spouse name: Not on file  . Number of children: 2  . Years of education: Not on file  . Highest education level: Not on file  Occupational History  . Occupation: retired  Engineer, production  . Financial resource strain: Not on file  . Food insecurity:    Worry: Not on file    Inability: Not on file  . Transportation needs:    Medical: Not on file    Non-medical: Not on file  Tobacco Use  . Smoking status: Never Smoker  . Smokeless tobacco: Never Used  Substance and Sexual Activity  . Alcohol use: No  . Drug use: No  . Sexual activity: Not on file  Lifestyle  . Physical activity:    Days per week: Not on file    Minutes per session: Not on file    . Stress: Not on file  Relationships  . Social connections:    Talks on phone: Not on file    Gets together: Not on file    Attends religious service: Not on file    Active member of club or organization: Not on file    Attends meetings of clubs or organizations: Not on file    Relationship status: Not on file  . Intimate partner violence:    Fear of current or ex partner: Not on file    Emotionally abused: Not on file    Physically abused: Not on file    Forced sexual activity: Not on file  Other Topics Concern  . Not on file  Social History Narrative  . Not on file    Review of Systems: All systems reviewed and negative except where noted in HPI.  Physical Exam: Vital signs in last 24 hours: Pulse Rate:  [59-66] 63 (06/10 1352) Resp:  [18] 18 (06/10 1310) BP: (92-126)/(60-68) 126/68 (06/10 1352) SpO2:  [99 %-100 %] 99 % (06/10 1352)   General:   Alert, well-developed,  White male in NAD Psych:  Pleasant, cooperative. Normal mood and affect. Eyes:  Pupils equal, sclera clear, no icterus.   Conjunctiva pink. Ears:  Normal auditory acuity. Nose:  No deformity, discharge,  or lesions. Neck:  Supple; no masses Lungs:  Clear throughout to auscultation.   No wheezes, crackles, or rhonchi.  Heart:  Regular rate and rhythm; no murmurs, no edema Abdomen:  Soft, non-distended, mild RLQ tenderness, BS active, no palp mass    Rectal:  Deferred  Msk:  Symmetrical without gross deformities. . Neurologic:  Alert and  oriented x4;  grossly normal neurologically. Skin:  Intact without significant lesions or rashes..   Intake/Output from previous day: No  intake/output data recorded. Intake/Output this shift: No intake/output data recorded.  Lab Results: Recent Labs    12/26/17 1427  WBC 6.4  HGB 7.4*  HCT 22.2*  PLT 155   PT/INR Recent Labs    12/26/17 1427  LABPROT 20.5*  INR 1.78   Studies/Results: No results found.   Willette Cluster, NP-C @  12/26/2017, 3:22  PM

## 2017-12-26 NOTE — Significant Event (Signed)
Rapid Response Event Note  Overview: Time Called: 1252 Arrival Time: 1255 Event Type: Hypotension  Initial Focused Assessment: [facility] he was assisted to the bathroom where he had a large bloody bm and became syncopal. Upon my arrival he is lying in the bed. Alert and oriented but pale, cool and clammy.  Per RN while patient was in the bathroom BP was 60/40. Lying in the bed BP  92/65  HR 64  RR 18    Interventions: Initially placed on NRB O2 sats 100%, weaned to 2L [place] O2 sats 100% 1 L NS bolus infusing 2nd PIV started right ac, blood drawn for labs Patient skin color improving. Post NS bolus BP 126/68  HR 63  Dr Lalla BrothersLambert at bedside to assess patient Orders received  Plan transfer to SD level care. Significant other at bedside after event, spoke with MD.  Plan of Care (if not transferred):  Event Summary: Name of Physician Notified: [place] at 1255    at    Outcome: Transferred (Comment)(sdu)  Event End Time: 1330  Joseph [name], Joseph [name]

## 2017-12-26 NOTE — Progress Notes (Signed)
Pt arrived to floor at approximately 12:30 via [facility] from [facility].  [facility] transporters assisted pt to the restroom and NT was with pt.  Pt was on toilet having BRBPR.  NT called me into the room stating that the pt "felt like he was going to pass out".  BP was 67/42 and as BP was being checked pt became unresponsive and diaphoretic.  Pt did have a faint pulse.  Pt became arouseable after less than 1 minute and was assisted to the bed with the steady and multiple staff members.  Rapid response was called, MD was called and pt was started on a NS bolus and O2.  BP continually began to come up.  MD did come to room and orders received.  Will continue to monitor until pt is transferred.  Hector ShadeMoss, Deedee Lybarger Mission[place]say

## 2017-12-26 NOTE — Consult Note (Signed)
Referring Provider: [facility]    Primary Care Physician:  Dema SeverinYork, Regina F, NP Primary Gastroenterologist:  Dr. Charm BargesButler,  MD  Reason for Consultation:   Lower GI bleeding   ASSESSMENT AND PLAN:    1. 77 yo male transferred [facility] with painless hematochezia on Eliquis. Became hemodynamically unstable after arrival. Rule out post-polypectomy bleed vrs diverticular bleed. Multiple polyps removed on colonoscopy 12/14/17. Spoke with Dr. Charm BargesButler and some were cauterized. He had diverticulosis apparently, though not well described on the report.  -Discussed with Hospitalist who will order blood transfusion now.  -CTA vrs unprepped colonoscopy today at bedside. After speaking with Dr. Charm BargesButler it sounds like there is a possibility that bleeding is from polypectomy so will likely proceed with colonoscopy when patient gets to Stepdown.  -I spoke with Pharm about reversal agent for Eliquis. It is available if needed for life threatening bleeding. Pharmacist # [phone] -keep NPO  2. Anemia of acute blood loss. Hgb mid 14 [facility], now mid 7  -Hospitalist ordering blood transfusion.   3. AKI, Cr 1.33, up from baseline of 1.09   HPI: Adella NissenDonald W Russell is a 77 y.o. male with CAD / stents, hx of PAF, DM, and CVA transferred [facility] with lower GI bleeding on Eliquis. Patient is s/p colonoscopy with removal of several small polyps (some with cautery) on 12/14/17 in [place] by Dr. Charm BargesButler.  He resumed Eliquis one day post procedure.  Yesterday patient began seeing spots of blood in stool and today began having large volume hematochezia. [facility] was [facility], subsequently became hypotensive with near syncopal episode . BP responded to fluids. [facility] was mid 14, now at 7.4. Last Eliquis dose was this am. Patient has no abdominal pain but is tender in RLQ.  No N/V.     Past Medical History:  Diagnosis Date  .  Abdominal discomfort    Abdominal discomfort: EGD 2011 showed no significant abnormalities.  Possibly due to metformin.  . Allergic rhinitis   . Anticoagulated 05/30/2016  . CAD (coronary artery disease)    a. s/p MI in 2004:  stents to the LAD and staged stent RCA, EF 55%;  b.  NSTEMI (1/16):  LHC - mid LAD stent with diff dist restenosis, prox to mid Dx mod dsz jailed by stent, small OM1 99, mCFX 75, dCFX 80, mRCA stent ok, mPDA mild to mod dsz, EF 55% >> PCI:  Synergy DES to LAD; Synergy DES (x2) mid and dist CFX  . 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  s/p back surgery  . DM (diabetes mellitus) (HCC)   . History of CVA (cerebrovascular accident)    Small CVA seen by head CT (incidental) in 1/13.  Carotid dopplers in 1/13 showed minimal disease  . History of Doppler ultrasound    Arterial dopplers (3/11): no evidence for significant PAD. ABIs 10/12: Normal.    . HLD (hyperlipidemia)   . HTN (hypertension)    had side effects with chlorthalidone  . Hx of cardiovascular stress test    Myoview (1/13): Small fixed apical septal defect with no ischemia, EF 58%.  [name]. Hx of echocardiogram    Echo (3/11): EF 60%, normal wall motion, mild MR, mild left atrial enlargement, mildly dilated ascending aorta.  [name]. PAF (paroxysmal atrial fibrillation) (HCC) 05/29/2016  . Spondylolisthesis  L4-S1    Past Surgical History:  Procedure Laterality Date  . COLONOSCOPY W/ POLYPECTOMY  12/2017  . CORONARY ANGIOGRAM  11/13/2014   Procedure: CORONARY ANGIOGRAM;  Surgeon: Laurey Morale, MD;  Location: [facility] CATH LAB;  Service: Cardiovascular;;  . CORONARY STENT PLACEMENT  2004   x2 LAD and RCA   . LEFT HEART CATHETERIZATION WITH CORONARY ANGIOGRAM N/A 08/02/2014   Procedure: LEFT HEART CATHETERIZATION WITH CORONARY ANGIOGRAM;  Surgeon: Corky Crafts, MD;  Location: [facility] CATH LAB;  Service: Cardiovascular;  Laterality: N/A;  . PROSTATE SURGERY     . SPINAL FUSION     L4-S1    Prior to Admission medications   Medication Sig Start Date End Date Taking? Authorizing Provider  acetaminophen (TYLENOL) 650 MG CR tablet Take 1,300 mg by mouth every 8 (eight) hours as needed for pain.    [provider]  amLODipine (NORVASC) 5 MG tablet Take 1 tablet (5 mg total) by mouth daily. 05/31/16   Leone Brand, NP  apixaban (ELIQUIS) 5 MG TABS tablet Take 1 tablet (5 mg total) by mouth 2 (two) times daily. 07/29/17   End, Cristal Deer, MD  Coenzyme Q10 100 MG capsule Take 1 capsule by mouth daily.    [provider]  fluticasone (FLONASE) 50 MCG/ACT nasal spray Place 2 sprays into both nostrils daily as needed for allergies or rhinitis.  03/31/15   [provider]  glipiZIDE (GLUCOTROL) 10 MG tablet Take 10 mg by mouth 2 (two) times daily before a meal.      [provider]  Insulin Detemir (LEVEMIR FLEXTOUCH) 100 UNIT/ML Pen Inject 20 Units into the skin 2 (two) times daily before a meal.    [provider]  isosorbide mononitrate (IMDUR) 60 MG 24 hr tablet Take 120 mg by mouth daily.    [provider]  lisinopril (PRINIVIL,ZESTRIL) 20 MG tablet Take 1 tablet (20 mg total) by mouth daily. 10/31/17 01/29/18  Tereso Newcomer T, PA-C  metoprolol (LOPRESSOR) 25 MG tablet Take 0.5 tablets (12.5 mg total) by mouth 2 (two) times daily. 08/03/14   Tereso Newcomer T, PA-C  nitroGLYCERIN (NITROSTAT) 0.4 MG SL tablet Place 0.4 mg under the tongue every 5 (five) minutes as needed for chest pain (x 3 doses).     [provider]  Omega-3 Fatty Acids (FISH OIL) 1000 MG CAPS Take 4 capsules (4,000 mg total) by mouth daily. 06/03/16   Bhagat, Sharrell Ku, PA  triamcinolone cream (KENALOG) 0.5 % Apply 1 application topically as needed for irritation. 08/24/17   [provider]    Current Facility-Administered Medications  Medication Dose Route Frequency Provider Last Rate Last Dose  . 0.9 %  sodium  chloride infusion   Intravenous Once Cleatrice Burke B, MD      . 0.9 %  sodium chloride infusion   Intravenous Continuous Ellis Parents, MD      . acetaminophen (TYLENOL) tablet 650 mg  650 mg Oral Q6H PRN Ellis Parents, MD       Or  . acetaminophen (TYLENOL) suppository 650 mg  650 mg Rectal Q6H PRN Ellis Parents, MD      . insulin aspart (novoLOG) injection 0-15 Units  0-15 Units Subcutaneous TID WC Cleatrice Burke B, MD      . insulin aspart (novoLOG) injection 0-5 Units  0-5 Units Subcutaneous QHS Cleatrice Burke B, MD      . insulin detemir (LEVEMIR) injection 15 Units  15 Units Subcutaneous BID  Ellis Parents, MD      . ondansetron [facility]) tablet 4 mg  4 mg Oral Q6H PRN Ellis Parents, MD       Or  . ondansetron [facility]) injection 4 mg  4 mg Intravenous Q6H PRN Ellis Parents, MD      . traMADol Janean Sark) tablet 50 mg  50 mg Oral Q6H PRN Ellis Parents, MD        Allergies as of 12/26/2017 - Review Complete 12/26/2017  Allergen Reaction Noted  . Rosuvastatin Other (See Comments) 04/06/2017  . Zetia [ezetimibe]  08/02/2017    Family History  Problem Relation Age of Onset  . Heart attack Mother   . Heart attack Father   . Stroke Neg Hx     Social History   Socioeconomic History  . Marital status: Divorced    Spouse name: Not on file  . Number of children: 2  . Years of education: Not on file  . Highest education level: Not on file  Occupational History  . Occupation: retired  Engineer, production  . Financial resource strain: Not on file  . Food insecurity:    Worry: Not on file    Inability: Not on file  . Transportation needs:    Medical: Not on file    Non-medical: Not on file  Tobacco Use  . Smoking status: Never Smoker  . Smokeless tobacco: Never Used  Substance and Sexual Activity  . Alcohol use: No  . Drug use: No  . Sexual activity: Not on file  Lifestyle  . Physical activity:    Days per week: Not on file    Minutes per session: Not on file    . Stress: Not on file  Relationships  . Social connections:    Talks on phone: Not on file    Gets together: Not on file    Attends religious service: Not on file    Active member of club or organization: Not on file    Attends meetings of clubs or organizations: Not on file    Relationship status: Not on file  . Intimate partner violence:    Fear of current or ex partner: Not on file    Emotionally abused: Not on file    Physically abused: Not on file    Forced sexual activity: Not on file  Other Topics Concern  . Not on file  Social History Narrative  . Not on file    Review of Systems: All systems reviewed and negative except where noted in HPI.  Physical Exam: Vital signs in last 24 hours: Pulse Rate:  [59-66] 63 (06/10 1352) Resp:  [18] 18 (06/10 1310) BP: (92-126)/(60-68) 126/68 (06/10 1352) SpO2:  [99 %-100 %] 99 % (06/10 1352)   General:   Alert, well-developed,  White male in NAD Psych:  Pleasant, cooperative. Normal mood and affect. Eyes:  Pupils equal, sclera clear, no icterus.   Conjunctiva pink. Ears:  Normal auditory acuity. Nose:  No deformity, discharge,  or lesions. Neck:  Supple; no masses Lungs:  Clear throughout to auscultation.   No wheezes, crackles, or rhonchi.  Heart:  Regular rate and rhythm; no murmurs, no edema Abdomen:  Soft, non-distended, mild RLQ tenderness, BS active, no palp mass    Rectal:  Deferred  Msk:  Symmetrical without gross deformities. . Neurologic:  Alert and  oriented x4;  grossly normal neurologically. Skin:  Intact without significant lesions or rashes..   Intake/Output from previous day: No  intake/output data recorded. Intake/Output this shift: No intake/output data recorded.  Lab Results: Recent Labs    12/26/17 1427  WBC 6.4  HGB 7.4*  HCT 22.2*  PLT 155   PT/INR Recent Labs    12/26/17 1427  LABPROT 20.5*  INR 1.78   Studies/Results: No results found.   Willette Cluster, NP-C @  12/26/2017, 3:22  PM

## 2017-12-26 NOTE — Op Note (Signed)
[facility] [name] Procedure Date : 12/26/2017 MRN: [id number] Attending MD: Willaim Rayas. Belford Pascucci , MD Date of Birth: Jan 30, 1941 CSN: [id number] Age: 77 Admit Type: Inpatient Procedure:                Colonoscopy Indications:              Rectal bleeding, history of colonoscopy on 5/29                            with multiple polyps removed - 3 required cautery,                            on Eliquis, Hgb dropped from 14s to 7s today -                            unprepped colonoscopy done to assess for post                            polypectomy bleeding versus diverticular bleeding. Providers:                Willaim Rayas. Adela Lank, MD, Tomma Rakers, RN, Zoila Shutter, Technician Referring MD:              Medicines:                Fentanyl 50 micrograms IV, Midazolam 7 mg IV Complications:            No immediate complications. Estimated blood loss:                            Minimal. Estimated Blood Loss:     Estimated blood loss was minimal. Procedure:                Pre-Anesthesia Assessment:                           - Prior to the procedure, a History and Physical                            was performed, and patient medications and                            allergies were reviewed. The patient's tolerance of                            previous anesthesia was also reviewed. The risks                            and benefits of the procedure and the sedation                            options and risks were discussed with the patient.  All questions were answered, and informed consent                            was obtained. Prior Anticoagulants: The patient has                            taken Eliquis (apixaban), last dose was day of                            procedure. ASA Grade Assessment: III - A patient                            with severe systemic disease. After reviewing the                   risks and benefits, the patient was deemed in                            satisfactory condition to undergo the procedure.                           After obtaining informed consent, the colonoscope                            was passed under direct vision. Throughout the                            procedure, the patient's blood pressure, pulse, and                            oxygen saturations were monitored continuously. The                            EC-[id number] ([id number]) scope was introduced through                            the anus and advanced to the the cecum, identified                            by the ileocecal valve. The colonoscopy was                            technically difficult and complex due to                            unsatisfactory bowel prep. The patient tolerated                            the procedure well. The quality of the bowel                            preparation was unsatisfactory. The ileocecal valve  and the rectum were photographed. Scope In: 5:54:33 PM Scope Out: 6:22:31 PM Scope Withdrawal Time: 0 hours 15 minutes 32 seconds  Total Procedure Duration: 0 hours 27 minutes 58 seconds  Findings:      The perianal and digital rectal examinations were normal.      Clotted blood was found in left, transverse, and ascending colon,       highest burden in transverse and left colon. Residual stool was found in       the right colon and cecal cap could not be cleared. Some residual stool       in the left colon prohibited views in some areas.      A single ulcer at a prior polypectomy site was found in the distal       ascending colon. No bleeding was present or stigmata of bleeding was       noted but old blood was noted in the area. In case this was related, one       hemostatic clip was successfully placed.      A single ulcer with adherent clot, possible vessel, was found in what       was suspected to be distal  transverse versus proximal descending colon       in an area of residual blood / clots. For hemostasis, two hemostatic       clips were successfully placed.      Multiple medium-mouthed diverticula were found in the left colon.      Internal hemorrhoids were found during retroflexion.      Several minutes were spent lavaging the transverse and sigmoid colon. No       active bleeding appreciated. Some areas not well visualized due to       residual stool but no heme in the area. The polypectomy sites in the       sigmoid colon were not appreciated due to prep. The exam was otherwise       without abnormality. Impression:               - Preparation of the colon was unsatisfactory.                           - Blood in the entire examined colon as above,                            residual stool present as well.                           - A single ulcer in the ascending colon from prior                            polypectomy site. I think less likely the cause for                            bleeding but one clip was placed to close the                            lesion.                           - A single ulcer  with adherent clot / possible                            vessel in the distal transverse / proximal                            descending colon in an area with residual blood.                            It's possible this lesion could account for                            bleeding. 2 clips were placed.                           - Diverticulosis in the left colon. Unable to clear                            the colon completely due to residual stool, some                            areas of the sigmoid not well visualized.                           - Internal hemorrhoids.                           - The examination was otherwise normal.                           Overall, endoscopic therapy applied to polypectomy                            sites as outlined above, it's possible the  more                            distal polypectomy site could account for bleeding,                            versus diverticular bleed in the left colon, as due                            to prep, some portions of the colon were not well                            visualized. No active bleeding appreciated during                            this exam, it appears to have stopped. Moderate Sedation:      Moderate (conscious) sedation was administered by the endoscopy nurse       and supervised by the endoscopist. The patient's oxygen saturation,       heart rate, blood pressure and response to care were monitored. Total  physician intraservice time was 32 minutes. Recommendation:           - Return patient to hospital ward for ongoing care.                           - Clear liquid diet okay.                           - Continue present medications.                           - Hold Eliquis                           - Monitor Hgb                           - If patient has recurrent bleeding moving forward,                            recommend CT angiogram to further evaluate Procedure Code(s):        --- Professional ---                           [phone], Colonoscopy, flexible; with control of                            bleeding, any method                           99152, 59, Moderate sedation services provided by                            the same physician or other qualified health care                            professional performing the diagnostic or                            therapeutic service that the sedation supports,                            requiring the presence of an independent trained                            observer to assist in the monitoring of the                            patient's level of consciousness and physiological                            status; initial 15 minutes of intraservice time,                            patient age 60 years or older  99153, [id number] sedation services provided by the                            same physician or other qualified health care                            professional performing the diagnostic or                            therapeutic service that the sedation supports,                            requiring the presence of an independent trained                            observer to assist in the monitoring of the                            patient's level of consciousness and physiological                            status; each additional 15 minutes intraservice                            time (List separately in addition to code for                            primary service) Diagnosis Code(s):        --- Professional ---                           K64.8, Other hemorrhoids                           K92.2, Gastrointestinal hemorrhage, unspecified                           K63.3, Ulcer of intestine                           K62.5, Hemorrhage of anus and rectum                           K57.30, Diverticulosis of large intestine without                            perforation or abscess without bleeding CPT copyright 2017 [employer]. All rights reserved. The codes documented in this report are preliminary and upon coder review may  be revised to meet current compliance requirements. Joseph SpareSteven P. Maria Coin, MD 12/26/2017 6:40:23 PM This report has been signed electronically. Number of Addenda: 0

## 2017-12-27 ENCOUNTER — Encounter (HOSPITAL_COMMUNITY): Payer: Self-pay | Admitting: Gastroenterology

## 2017-12-27 LAB — BPAM RBC
Blood Product Expiration Date: 201907072359
Blood Product Expiration Date: 201907072359
ISSUE DATE / TIME: 201906101602
ISSUE DATE / TIME: 201906102249
Unit Type and Rh: 5100
Unit Type and Rh: 5100

## 2017-12-27 LAB — TYPE AND SCREEN
ABO/RH(D): O POS
Antibody Screen: NEGATIVE
Unit division: 0
Unit division: 0

## 2017-12-27 LAB — GLUCOSE, CAPILLARY
Glucose-Capillary: 98 mg/dL (ref 65–99)
Glucose-Capillary: 173 mg/dL — ABNORMAL HIGH (ref 65–99)
Glucose-Capillary: 100 mg/dL — ABNORMAL HIGH (ref 65–99)
Glucose-Capillary: 149 mg/dL — ABNORMAL HIGH (ref 65–99)

## 2017-12-27 LAB — CBC
HCT: 35 % — ABNORMAL LOW (ref 39.0–52.0)
HCT: 35.1 % — ABNORMAL LOW (ref 39.0–52.0)
Hemoglobin: 12 g/dL — ABNORMAL LOW (ref 13.0–17.0)
Hemoglobin: 12.1 g/dL — ABNORMAL LOW (ref 13.0–17.0)
MCH: 30.6 pg (ref 26.0–34.0)
MCH: 31 pg (ref 26.0–34.0)
MCHC: 34.3 g/dL (ref 30.0–36.0)
MCHC: 34.5 g/dL (ref 30.0–36.0)
MCV: 89.3 fL (ref 78.0–100.0)
MCV: 90 fL (ref 78.0–100.0)
Platelets: 198 10*3/uL (ref 150–400)
Platelets: 197 10*3/uL (ref 150–400)
RBC: 3.92 MIL/uL — ABNORMAL LOW (ref 4.22–5.81)
RBC: 3.9 MIL/uL — ABNORMAL LOW (ref 4.22–5.81)
RDW: 12.3 % (ref 11.5–15.5)
RDW: 12.3 % (ref 11.5–15.5)
WBC: 10 10*3/uL (ref 4.0–10.5)
WBC: 9.6 10*3/uL (ref 4.0–10.5)

## 2017-12-27 LAB — BASIC METABOLIC PANEL
Anion gap: 7 (ref 5–15)
BUN: 20 mg/dL (ref 6–20)
CO2: 23 mmol/L (ref 22–32)
Calcium: 8.8 mg/dL — ABNORMAL LOW (ref 8.9–10.3)
Chloride: 110 mmol/L (ref 101–111)
Creatinine, Ser: 1.17 mg/dL (ref 0.61–1.24)
GFR calc Af Amer: >60 mL/min (ref >60)
GFR calc non Af Amer: 57 mL/min — ABNORMAL LOW (ref >60)
Glucose, Bld: 113 mg/dL — ABNORMAL HIGH (ref 65–99)
Potassium: 4.1 mmol/L (ref 3.5–5.1)
Sodium: 140 mmol/L (ref 135–145)

## 2017-12-27 MED ORDER — ISOSORBIDE MONONITRATE ER 60 MG PO TB24
120.0000 mg | ORAL_TABLET | Freq: Every day | ORAL | Status: DC
  Administered 2017-12-28: 120 mg via ORAL
  Filled 2017-12-27: qty 2

## 2017-12-27 NOTE — Progress Notes (Signed)
[place] TEAM 1 - Stepdown/ICU TEAM  Adella NissenDonald W Muse  ZOX:[id number] DOB: [birth year] DOA: 12/26/2017 PCP: Dema SeverinYork, Regina F, NP    Brief Narrative:  77 y.o. male w/ a hx of DM2, HTN, HLD, CAD s/p MI 2004, and PAF on chronic anticoagulation who [facility] He underwent routine colonoscopy on 5/29 during which 5 polyps were removed. 5 days prior to his admit he started feeling general malaise. He had a few episodes of minimal BRB on the tissue during this time, but on the morning of presentation had an intense urge to have a BM and passed a large loose bloody BM.  On arrival he was hypotensive at 67/42, and presyncopal.     Subjective: The patient is resting comfortably in bed.  He reports a good appetite and no difficulty with eating.  He reports no abdominal pain chest pain or shortness of breath.  He has not had any further bright red blood per rectum today.  He denies lightheadedness or headache.  Assessment & Plan:  Lower GI bleed Probable post polypectomy bleeding - status post colonoscopy with multiple clips deployed - bleeding appears to have ceased - tolerating diet thus far  Hemorrhagic shock - Hypotension  Corrected with volume resuscitation and cessation of blood loss - status post 2 units packed red blood cells  Acute blood loss anemia Hemoglobin stable at this time  Recent Labs  Lab 12/26/17 1427 12/26/17 2000 12/27/17 0256 12/27/17 0730  HGB 7.4* 11.6* 12.0* 12.1*    CAD Asymptomatic  DM2  CBG presently well controlled  PAF Hold Eliquis for an additional 5-7 days - rate well controlled  HTN No changes in blood pressure medications today  DVT prophylaxis: SCDs Code Status: FULL CODE Family Communication: Spoke with patient and family at bedside Disposition Plan: Probable discharge home 6/12 if hemoglobin stable and continues to tolerate diet  Consultants:  [facility]   Antimicrobials:  none  Objective: Blood  pressure 136/69, pulse 61, temperature 97.9 F (36.6 C), temperature source Oral, resp. rate 14, height 6\' 2"  (1.88 m), weight 100.5 kg (221 lb 9 oz), SpO2 96 %.  Intake/Output Summary (Last 24 hours) at 12/27/2017 1623 Last data filed at 12/27/2017 1525 Gross per 24 hour  Intake 4179.17 ml  Output 2700 ml  Net 1479.17 ml   Filed Weights   12/27/17 1205  Weight: 100.5 kg (221 lb 9 oz)    Examination: General: No acute respiratory distress Lungs: Clear to auscultation bilaterally without wheezes or crackles Cardiovascular: Regular rate and rhythm without murmur gallop or rub normal S1 and S2 Abdomen: Nontender, nondistended, soft, bowel sounds positive, no rebound, no ascites, no appreciable mass Extremities: No significant cyanosis, clubbing, or edema bilateral lower extremities  CBC: Recent Labs  Lab 12/26/17 1427 12/26/17 2000 12/27/17 0256 12/27/17 0730  WBC 6.4 11.7* 10.0 9.6  NEUTROABS 4.7  --   --   --   HGB 7.4* 11.6* 12.0* 12.1*  HCT 22.2* 34.0* 35.0* 35.1*  MCV 92.9 91.6 89.3 90.0  PLT 155 229 198 197   Basic Metabolic Panel: Recent Labs  Lab 12/26/17 1427 12/27/17 0256  NA 137 140  K 4.6 4.1  CL 108 110  CO2 22 23  GLUCOSE 237* 113*  BUN 28* 20  CREATININE 1.33* 1.17  CALCIUM 9.1 8.8*   GFR: Estimated Creatinine Clearance: 64.8 mL/min (by C-G formula based on SCr of 1.17 mg/dL).  Liver Function Tests: Recent Labs  Lab  12/26/17 1427  AST 15  ALT 13*  ALKPHOS 35*  BILITOT 0.5  PROT 5.3*  ALBUMIN 3.4*   Coagulation Profile: Recent Labs  Lab 12/26/17 1427  INR 1.78    HbA1C: Hgb A1c MFr Bld  Date/Time Value Ref Range Status  05/29/2016 03:08 PM 7.5 (H) 4.8 - 5.6 % Final    Comment:    (NOTE)         Pre-diabetes: 5.7 - 6.4         Diabetes: >6.4         Glycemic control for adults with diabetes: <7.0   08/02/2014 09:10 PM 7.1 (H) <5.7 % Final    Comment:    (NOTE)                                                                        According to the ADA Clinical Practice Recommendations for 2011, when HbA1c is used as a screening test:  >=6.5%   Diagnostic of Diabetes Mellitus           (if abnormal result is confirmed) 5.7-6.4%   Increased risk of developing Diabetes Mellitus References:Diagnosis and Classification of Diabetes Mellitus,Diabetes Care,2011,34(Suppl 1):S62-S69 and Standards of Medical Care in         Diabetes - 2011,Diabetes Care,2011,34 (Suppl 1):S11-S61.     CBG: Recent Labs  Lab 12/26/17 1257 12/26/17 1702 12/26/17 2133 12/27/17 0754 12/27/17 1204  GLUCAP 229* 205* 131* 98 173*    No results found for this or any previous visit (from the past 240 hour(s)).   Scheduled Meds: . insulin aspart  0-15 Units Subcutaneous TID WC  . insulin aspart  0-5 Units Subcutaneous QHS  . insulin detemir  15 Units Subcutaneous BID   Continuous Infusions: . sodium chloride 150 mL/hr at 12/27/17 1525  . sodium chloride       LOS: 1 day   Lonia Blood, MD [facility] Office  [phone] Pager - Text Page per Amion as per below:  On-Call/Text Page:      [email or web address]      password [email or web address]  If 7PM-7AM, please contact night-coverage [email or web address] Password [facility] 12/27/2017, 4:23 PM

## 2017-12-27 NOTE — Progress Notes (Addendum)
          Daily Rounding Note  12/27/2017, 8:10 AM  LOS: 1 day   SUBJECTIVE:   Chief complaint:     Last episode of rectal bleeding was around 2 PM yesterday, this was before his colonoscopy.  He feels well.  He has not been out of bed so he cannot say whether or not he is dizzy or weak.  Tolerating clear liquids.  OBJECTIVE:         Vital signs in last 24 hours:    Temp:  [97.3 F (36.3 C)-98.5 F (36.9 C)] 97.5 F (36.4 C) (06/11 0758) Pulse Rate:  [53-69] 63 (06/11 0402) Resp:  [14-22] 18 (06/11 0402) BP: (92-178)/(56-78) 121/64 (06/11 0402) SpO2:  [92 %-100 %] 92 % (06/11 0402) Last BM Date: 12/26/17 There were no vitals filed for this visit. General: Looks well.  Comfortable. Heart: RRR.  No MRG.  S1, S2 present. Chest: Clear bilaterally.  No cough or labored breathing. Abdomen: Soft.  Active bowel sounds.  Not tender or distended. Extremities: No CCE. Neuro/Psych: Fully alert and oriented.  No limb weakness or tremor.  No gross deficits.  Intake/Output from previous day: 06/10 0701 - 06/11 0700 In: 1809.2 [I.V.:1027.5; Blood:781.7] Out: 1400 [Urine:1400]  Intake/Output this shift: No intake/output data recorded.  Lab Results: Recent Labs    12/26/17 2000 12/27/17 0256 12/27/17 0730  WBC 11.7* 10.0 9.6  HGB 11.6* 12.0* 12.1*  HCT 34.0* 35.0* 35.1*  PLT 229 198 197   BMET Recent Labs    12/26/17 1427 12/27/17 0256  NA 137 140  K 4.6 4.1  CL 108 110  CO2 22 23  GLUCOSE 237* 113*  BUN 28* 20  CREATININE 1.33* 1.17  CALCIUM 9.1 8.8*   LFT Recent Labs    12/26/17 1427  PROT 5.3*  ALBUMIN 3.4*  AST 15  ALT 13*  ALKPHOS 35*  BILITOT 0.5   PT/INR Recent Labs    12/26/17 1427  LABPROT 20.5*  INR 1.78   Hepatitis Panel No results for input(s): HEPBSAG, HCVAB, HEPAIGM, HEPBIGM in the last 72 hours.  Studies/Results: No results found.  ASSESMENT:   *   LGIB.  Painless hematochezia.     Colonoscopy, multiple polypectomies, post polypectomy cauterization, finding of diverticulosis at colonoscopy 5/29.   6/10 unprepped Colonoscopy: ascending ulcer not bleeding, clipped.  Ulcer with clot and ? VV distal transverse/prox descending clipped x 2 this was likely sources of bleeding.  Diverticulosis.  Internal hemorrhoids.  Some areas obscured by retained stool.   *   ABL anemia.  Good response to 2 U  RBCs,   *   Chronic Eliquis.  Hx PAF and cardiac stents.  Resumed 5/30.   Currently on hold.  Did not receive K [facility].     PLAN   *   Resume Eliquis 12/31/17.    *    Carbohydrate modified diet.  *    Will discuss the patient with Dr. Adela LankArmbruster, he could possibly go home this afternoon.  If he does stay another night, CBC is ordered for the morning.  *    Discontinue cardiac monitoring.     *     GI follow-up with Dr. Charm BargesButler in [place].    Jennye MoccasinSarah Hamlin Devine  12/27/2017, 8:10 AM Phone [phone]

## 2017-12-28 DIAGNOSIS — K625 Hemorrhage of anus and rectum: Secondary | ICD-10-CM

## 2017-12-28 DIAGNOSIS — I10 Essential (primary) hypertension: Secondary | ICD-10-CM

## 2017-12-28 DIAGNOSIS — E1159 Type 2 diabetes mellitus with other circulatory complications: Secondary | ICD-10-CM

## 2017-12-28 DIAGNOSIS — R578 Other shock: Secondary | ICD-10-CM

## 2017-12-28 LAB — BASIC METABOLIC PANEL
Anion gap: 8 (ref 5–15)
BUN: 17 mg/dL (ref 6–20)
CHLORIDE: 109 mmol/L (ref 101–111)
CO2: 22 mmol/L (ref 22–32)
CREATININE: 1.18 mg/dL (ref 0.61–1.24)
Calcium: 8.8 mg/dL — ABNORMAL LOW (ref 8.9–10.3)
GFR calc non Af Amer: 57 mL/min — ABNORMAL LOW (ref >60)
Glucose, Bld: 108 mg/dL — ABNORMAL HIGH (ref 65–99)
POTASSIUM: 4.5 mmol/L (ref 3.5–5.1)
Sodium: 139 mmol/L (ref 135–145)

## 2017-12-28 LAB — CBC
HEMATOCRIT: 37.7 % — AB (ref 39.0–52.0)
HEMOGLOBIN: 13 g/dL (ref 13.0–17.0)
MCH: 31.7 pg (ref 26.0–34.0)
MCHC: 34.5 g/dL (ref 30.0–36.0)
MCV: 92 fL (ref 78.0–100.0)
Platelets: 182 10*3/uL (ref 150–400)
RBC: 4.1 MIL/uL — ABNORMAL LOW (ref 4.22–5.81)
RDW: 12.2 % (ref 11.5–15.5)
WBC: 7.5 10*3/uL (ref 4.0–10.5)

## 2017-12-28 LAB — GLUCOSE, CAPILLARY
Glucose-Capillary: 101 mg/dL — ABNORMAL HIGH (ref 65–99)
Glucose-Capillary: 182 mg/dL — ABNORMAL HIGH (ref 65–99)

## 2017-12-28 MED ORDER — APIXABAN 5 MG PO TABS: 5.0000 mg | ORAL_TABLET | Freq: Two times a day (BID) | ORAL | Status: DC

## 2017-12-28 NOTE — Progress Notes (Signed)
      Progress Note   Subjective  Patient tolerating diet, no further blood in the stools since prior to colonoscopy. Feels well. Hgb stable   Objective   Vital signs in last 24 hours: Temp:  [97.3 F (36.3 C)-98.3 F (36.8 C)] 97.3 F (36.3 C) (06/12 0800) Pulse Rate:  [54-59] 55 (06/12 0800) Resp:  [16-19] 16 (06/12 0800) BP: (136-151)/(67-82) 151/74 (06/12 0800) SpO2:  [97 %] 97 % (06/12 0800) Weight:  [221 lb 9 oz (100.5 kg)] 221 lb 9 oz (100.5 kg) (06/11 1205) Last BM Date: 12/26/17 General:    white male in NAD Heart:  Regular rate and rhythm; no murmurs Lungs: Respirations even and unlabored, lungs CTA bilaterally Abdomen:  Soft, nontender and nondistended.  Extremities:  Without edema. Neurologic:  Alert and oriented,  grossly normal neurologically. Psych:  Cooperative. Normal mood and affect.  Intake/Output from previous day: 06/11 0701 - 06/12 0700 In: 3510 [P.O.:840; I.V.:2670] Out: 4470 [Urine:4470] Intake/Output this shift: No intake/output data recorded.  Lab Results: Recent Labs    12/27/17 0256 12/27/17 0730 12/28/17 0759  WBC 10.0 9.6 7.5  HGB 12.0* 12.1* 13.0  HCT 35.0* 35.1* 37.7*  PLT 198 197 182   BMET Recent Labs    12/26/17 1427 12/27/17 0256 12/28/17 0759  NA 137 140 139  K 4.6 4.1 4.5  CL 108 110 109  CO2 22 23 22   GLUCOSE 237* 113* 108*  BUN 28* 20 17  CREATININE 1.33* 1.17 1.18  CALCIUM 9.1 8.8* 8.8*   LFT Recent Labs    12/26/17 1427  PROT 5.3*  ALBUMIN 3.4*  AST 15  ALT 13*  ALKPHOS 35*  BILITOT 0.5   PT/INR Recent Labs    12/26/17 1427  LABPROT 20.5*  INR 1.78    Studies/Results: No results found.     Assessment / Plan:   77 y/o male on Eliquis who presented with rectal bleeding on Monday. Colonoscopy done with prior polypectomy site seen with adherent clot / suspected vessel. Hemostasis clips placed and he has had no further bleeding since then. Hgb stable, tolerating diet.  Okay to be discharged  today. I would hold Eliquis another 3-5 days as he recovers from this given his significant bleeding episode.  Please call with questions.   Ileene PatrickSteven Babbie Dondlinger, MD [facility]

## 2017-12-28 NOTE — Discharge Summary (Addendum)
Physician Discharge Summary  Joseph [name] WUJ:[id number] DOB: Dec 11, 1940  PCP: Dema Severin, NP  Admit date: 12/26/2017 Discharge date: 12/28/2017  Recommendations for Outpatient Follow-up:  1. [name], NP/PCP in 1 week with repeat labs (CBC & BMP). 2. Dr. Webb Silversmith, GI in 1 week. 3. Dr. Maximiano Coss, Cardiology  Home Health: None Equipment/Devices: None  Discharge Condition: Improved and stable CODE STATUS: Full Diet recommendation: Heart healthy & diabetic diet.  Discharge Diagnoses:  Principal Problem:   Lower GI bleed Active Problems:   Type 2 diabetes mellitus with vascular disease (HCC)   Pure hypercholesterolemia   Essential hypertension   CAD S/P percutaneous coronary angioplasty   Paroxysmal atrial fibrillation (HCC)   Anticoagulated   Coronary artery disease of native artery of native heart with stable angina pectoris (HCC)   Hypotension   Postural dizziness with presyncope   Brief Summary: 77 y.o.malew/ a hx of DM2, HTN, HLD, CAD s/p MI 2004, and PAF on chronic anticoagulation who presented on transfer from [facility] for [facility]. He underwent routine colonoscopy on 5/29 during which 5 polyps were removed. 5 days prior to his admit he started feeling general malaise. He had a few episodes of minimal BRB on the tissue during this time, but on the morning of presentation had an intense urge to have a BM and passed a large loose bloody BM.  On arrival he was hypotensive at 67/42, and presyncopal versus syncopized.    Assessment and plan  Lower GI bleed Probable post polypectomy bleeding - status post colonoscopy with multiple clips deployed - bleeding appears to have ceased - tolerating diet thus far As per [facility] follow-up, colonoscopy done here showed prior polypectomy site seen with adherent clot/suspected vessel.  Hemostasis clips placed and he has had no further bleeding since then.  Hemoglobin stable and tolerating diet.  GI has cleared  him for discharge home and have advised to hold Eliquis for additional 3 days as he recovers from this significant bleeding episodes and then resume.  Hemorrhagic shock - Hypotension  Corrected with volume resuscitation and cessation of blood loss - status post 2 units packed red blood cells  Acute blood loss anemia Hemoglobin stable at this time  CAD Asymptomatic  DM2  CBGs mildly uncontrolled and fluctuating in the hospital while on reduced dose of Levemir and SSI.  Resume prior home dose of Levemir and oral hypoglycemics at discharge.  PAF Rate controlled.  As per GI recommendation, continue to hold Eliquis for additional 3 days and then resume if no further bleeding.  Patient and family aware. He was not taking Metoprolol prior to admission.  HTN Amlodipine and ACEI were temporarily held due to hemorrhagic shock from acute blood loss anemia.  Blood pressure starting to increase.  Resume these antihypertensives at discharge.  Near syncope versus syncope Most likely due to hemorrhagic shock from acute blood loss anemia related to acute lower GI bleed.  Treated as above and stabilized.  Patient and family advised that he should not drive until seen by his outpatient physician during close follow-up and clearance.  They verbalized understanding.  Acute kidney injury Secondary to acute blood loss and hemorrhagic shock.  Presented with creatinine of 1.33.  Resolved after IV fluids and blood transfusion.  Follow BMP in a few days as outpatient.  Consultants:  [facility]   Procedures:  Colonoscopy   Discharge Instructions  Discharge Instructions    Call MD for:   Complete by:  As directed  Blood in stools or black tarry stools.  Vomiting blood or coffee colored material.  Passing out or feeling like passing out.   Call MD for:  difficulty breathing, headache or visual disturbances   Complete by:  As directed    Call MD for:  extreme fatigue   Complete by:  As  directed    Call MD for:  persistant dizziness or light-headedness   Complete by:  As directed    Call MD for:  persistant nausea and vomiting   Complete by:  As directed    Call MD for:  severe uncontrolled pain   Complete by:  As directed    Diet - low sodium heart healthy   Complete by:  As directed    Diet Carb Modified   Complete by:  As directed    Driving Restrictions   Complete by:  As directed    Do not drive until seen and cleared by your outpatient physician during office visit.   Increase activity slowly   Complete by:  As directed        Medication List    STOP taking these medications   metoprolol tartrate 25 MG tablet Commonly known as:  LOPRESSOR     TAKE these medications   acetaminophen 650 MG CR tablet Commonly known as:  TYLENOL Take 1,300 mg by mouth every 8 (eight) hours as needed for pain.   amLODipine 10 MG tablet Commonly known as:  NORVASC Take 10 mg by mouth daily.   apixaban 5 MG Tabs tablet Commonly known as:  ELIQUIS Take 1 tablet (5 mg total) by mouth 2 (two) times daily. DO NOT TAKE FOR 3 MORE DAYS. MAY RESTART TAKING IT ON 01/01/2018. What changed:  additional instructions   Coenzyme Q10 100 MG capsule Take 1 capsule by mouth daily.   Fish Oil 1000 MG Caps Take 4 capsules (4,000 mg total) by mouth daily.   fluticasone 50 MCG/ACT nasal spray Commonly known as:  FLONASE Place 2 sprays into both nostrils daily as needed for allergies or rhinitis.   GARLIC PO Take 1 capsule by mouth 2 (two) times daily.   glipiZIDE 10 MG tablet Commonly known as:  GLUCOTROL Take 10 mg by mouth 2 (two) times daily before a meal.   isosorbide mononitrate 60 MG 24 hr tablet Commonly known as:  IMDUR Take 120 mg by mouth daily.   LEVEMIR FLEXTOUCH 100 UNIT/ML Pen Generic drug:  Insulin Detemir Inject 30 Units into the skin 2 (two) times daily before a meal.   lisinopril 20 MG tablet Commonly known as:  PRINIVIL,ZESTRIL Take 1 tablet (20 mg  total) by mouth daily.   nitroGLYCERIN 0.4 MG SL tablet Commonly known as:  NITROSTAT Place 0.4 mg under the tongue every 5 (five) minutes as needed for chest pain (x 3 doses).   triamcinolone cream 0.5 % Commonly known as:  KENALOG Apply 1 application topically as needed for irritation.      Follow-up Information    Dema SeverinYork, Regina F, NP. Schedule an appointment as soon as possible for a visit in 1 week(s).   Why:  To be seen with repeat labs (CBC & BMP). Contact information: [address] [place] [address] [phone]        Yvonne KendallEnd, Christopher, MD. Schedule an appointment as soon as possible for a visit.   Specialty:  Cardiology Contact information: [address] [place] [address] [phone]        Charm BargesButler,  Molly Maduro, MD. Schedule an appointment as soon as possible for a visit in 1 week(s).   Specialty:  Gastroenterology Why:  Call for an appointment. Contact information: [address] [place] [address] [phone]          Allergies  Allergen Reactions  . Rosuvastatin Other (See Comments)    Low energy/leg and hip pain  . Zetia [Ezetimibe]     myalgias      Procedures/Studies: No results found.    Subjective: Patient denies complaints.  No BM since 12/26/2017.  Tolerating diet without nausea, vomiting, abdominal pain.  No chest pain, dyspnea, palpitations, dizziness, lightheadedness or feeling like passing out.  As per RN, no acute issues reported.  Discharge Exam:  Vitals:   12/27/17 1526 12/27/17 2156 12/28/17 0604 12/28/17 0800  BP: 136/69 (!) 147/67 (!) 143/82 (!) 151/74  Pulse:  (!) 59 (!) 54 (!) 55  Resp:  19 18 16   Temp:  98.3 F (36.8 C) 97.9 F (36.6 C) (!) 97.3 F (36.3 C)  TempSrc:  Oral Oral Oral  SpO2:  97%  97%  Weight:      Height:        General: Pleasant elderly male, moderately built and nourished, lying comfortably propped up in bed.  Oral mucosa moist. Cardiovascular: S1 & S2 heard, irregularly  irregular, S1/S2 +. No murmurs, rubs, gallops or clicks. No JVD or pedal edema. Respiratory: Clear to auscultation without wheezing, rhonchi or crackles. No increased work of breathing. Abdominal:  Non distended, non tender & soft. No organomegaly or masses appreciated. Normal bowel sounds heard. CNS: Alert and oriented. No focal deficits. Extremities: no edema, no cyanosis    The results of significant diagnostics from this hospitalization (including imaging, microbiology, ancillary and laboratory) are listed below for reference.     Microbiology: No results found for this or any previous visit (from the past 240 hour(s)).   Labs: CBC: Recent Labs  Lab 12/26/17 1427 12/26/17 2000 12/27/17 0256 12/27/17 0730 12/28/17 0759  WBC 6.4 11.7* 10.0 9.6 7.5  NEUTROABS 4.7  --   --   --   --   HGB 7.4* 11.6* 12.0* 12.1* 13.0  HCT 22.2* 34.0* 35.0* 35.1* 37.7*  MCV 92.9 91.6 89.3 90.0 92.0  PLT 155 229 198 197 182   Basic Metabolic Panel: Recent Labs  Lab 12/26/17 1427 12/27/17 0256 12/28/17 0759  NA 137 140 139  K 4.6 4.1 4.5  CL 108 110 109  CO2 22 23 22   GLUCOSE 237* 113* 108*  BUN 28* 20 17  CREATININE 1.33* 1.17 1.18  CALCIUM 9.1 8.8* 8.8*   Liver Function Tests: Recent Labs  Lab 12/26/17 1427  AST 15  ALT 13*  ALKPHOS 35*  BILITOT 0.5  PROT 5.3*  ALBUMIN 3.4*    CBG: Recent Labs  Lab 12/27/17 1204 12/27/17 1707 12/27/17 2129 12/28/17 0805 12/28/17 1232  GLUCAP 173* 100* 149* 101* 182*   Discussed in detail with patient's daughter, updated care and answered questions.    Time coordinating discharge: 35 minutes  SIGNED:  Marcellus Scott, MD, FACP, [facility]. [facility] Pager [phone]  If 7PM-7AM, please contact night-coverage [email or web address] Password [email or web address] 12/28/2017, 1:25 PM

## 2017-12-28 NOTE — Discharge Instructions (Addendum)
Please get your medications reviewed and adjusted by your Primary MD. ° °Please request your Primary MD to go over all Hospital Tests and Procedure/Radiological results at the follow up, please get all Hospital records sent to your Prim MD by signing hospital release before you go home. ° °If you had Pneumonia of Lung problems at the Hospital: °Please get a 2 view Chest X ray done in 6-8 weeks after hospital discharge or sooner if instructed by your Primary MD. ° °If you have Congestive Heart Failure: °Please call your Cardiologist or Primary MD anytime you have any of the following symptoms:  °1) 3 pound weight gain in 24 hours or 5 pounds in 1 week  °2) shortness of breath, with or without a dry hacking cough  °3) swelling in the hands, feet or stomach  °4) if you have to sleep on extra pillows at night in order to breathe ° °Follow cardiac low salt diet and 1.5 lit/day fluid restriction. ° °If you have diabetes °Accuchecks 4 times/day, Once in AM empty stomach and then before each meal. °Log in all results and show them to your primary doctor at your next visit. °If any glucose reading is under 80 or above 300 call your primary MD immediately. ° °If you have Seizure/Convulsions/Epilepsy: °Please do not drive, operate heavy machinery, participate in activities at heights or participate in high speed sports until you have seen by Primary MD or a Neurologist and advised to do so again. ° °If you had Gastrointestinal Bleeding: °Please ask your Primary MD to check a complete blood count within one week of discharge or at your next visit. Your endoscopic/colonoscopic biopsies that are pending at the time of discharge, will also need to followed by your Primary MD. ° °Get Medicines reviewed and adjusted. °Please take all your medications with you for your next visit with your Primary MD ° °Please request your Primary MD to go over all hospital tests and procedure/radiological results at the follow up, please ask your  Primary MD to get all Hospital records sent to his/her office. ° °If you experience worsening of your admission symptoms, develop shortness of breath, life threatening emergency, suicidal or homicidal thoughts you must seek medical attention immediately by calling 911 or calling your MD immediately  if symptoms less severe. ° °You must read complete instructions/literature along with all the possible adverse reactions/side effects for all the Medicines you take and that have been prescribed to you. Take any new Medicines after you have completely understood and accpet all the possible adverse reactions/side effects.  ° °Do not drive or operate heavy machinery when taking Pain medications.  ° °Do not take more than prescribed Pain, Sleep and Anxiety Medications ° °Special Instructions: If you have smoked or chewed Tobacco  in the last 2 yrs please stop smoking, stop any regular Alcohol  and or any Recreational drug use. ° °Wear Seat belts while driving. ° °Please note °You were cared for by a hospitalist during your hospital stay. If you have any questions about your discharge medications or the care you received while you were in the hospital after you are discharged, you can call the unit and asked to speak with the hospitalist on call if the hospitalist that took care of you is not available. Once you are discharged, your primary care physician will handle any further medical issues. Please note that NO REFILLS for any discharge medications will be authorized once you are discharged, as it is imperative that you   return to your primary care physician (or establish a relationship with a primary care physician if you do not have one) for your aftercare needs so that they can reassess your need for medications and monitor your lab values.  You can reach the hospitalist office at phone [phone] or fax [phone]   If you do not have a primary care physician, you can call [phone] for a physician  referral.   Gastrointestinal Bleeding Gastrointestinal (GI) bleeding is bleeding somewhere along the digestive tract, between the mouth and anus. This can be caused by various problems. The severity of these problems can range from mild to serious or even life-threatening. If you have GI bleeding, you may find blood in your stools (feces), you may have black stools, or you may vomit blood. If there is a lot of bleeding, you may need to stay in the hospital. What are the causes? This condition may be caused by:  Esophagitis. This is inflammation, irritation, or swelling of the esophagus.  Hemorrhoids.These are swollen veins in the rectum.  Anal fissures.These are areas of painful tearing that are often caused by passing hard stool.  Diverticulosis.These are pouches that form on the colon over time, with age, and may bleed a lot.  Diverticulitis.This is inflammation in areas with diverticulosis. It can cause pain, fever, and bloody stools, although bleeding may be mild.  Polyps and cancer. Colon cancer often starts out as precancerous polyps.  Gastritis and ulcers. With these, bleeding may come from the upper GI tract, near the stomach.  What are the signs or symptoms? Symptoms of this condition may include:  Bright red blood in your vomit, or vomit that looks like coffee grounds.  Bloody, black, or tarry stools. ? Bleeding from the lower GI tract will usually cause red or maroon blood in the stools. ? Bleeding from the upper GI tract may cause black, tarry, often bad-smelling stools. ? In certain cases, if the bleeding is fast enough, the stools may be red.  Pain or cramping in the abdomen.  How is this diagnosed? This condition may be diagnosed based on:  Medical history and physical exam.  Various tests, such as: ? Blood tests. ? X-rays and other imaging tests. ? Esophagogastroduodenoscopy (EGD). In this test, a flexible, lighted tube is used to look at your esophagus,  stomach, and small intestine. ? Colonoscopy. In this test, a flexible, lighted tube is used to look at your colon.  How is this treated? Treatment for this condition depends on the cause of the bleeding. For example:  For bleeding from the esophagus, stomach, small intestine, or colon, the health care provider doing your EGD or colonoscopy may be able to stop the bleeding as part of the procedure.  Inflammation or infection of the colon can be treated with medicines.  Certain rectal problems can be treated with creams, suppositories, or warm baths.  Surgery is sometimes needed.  Blood transfusions are sometimes needed if a lot of blood has been lost.  If bleeding is slow, you may be allowed to go home. If there is a lot of bleeding, you will need to stay in the hospital for observation. Follow these instructions at home:  Take over-the-counter and prescription medicines only as told by your health care provider.  Eat foods that are high in fiber. This will help to keep your stools soft. These foods include whole grains, legumes, fruits, and vegetables. Eating 1-3 prunes each day works well for many people.  Drink enough fluid  to keep your urine clear or pale yellow.  Keep all follow-up visits as told by your health care provider. This is important. Contact a health care provider if:  Your symptoms do not improve. Get help right away if:  Your bleeding increases.  You feel light-headed or you faint.  You feel weak.  You have severe cramps in your back or abdomen.  You pass large blood clots in your stool.  Your symptoms are getting worse. This information is not intended to replace advice given to you by your health care provider. Make sure you discuss any questions you have with your health care provider. Document Released: 07/02/2000 Document Revised: 12/03/2015 Document Reviewed: 12/23/2014 Elsevier Interactive Patient Education  2018 [employer]    Syncope Syncope is when you temporarily lose consciousness. Syncope may also be called fainting or passing out. It is caused by a sudden decrease in blood flow to the brain. Even though most causes of syncope are not dangerous, syncope can be a sign of a serious medical problem. Signs that you may be about to faint include:  Feeling dizzy or light-headed.  Feeling nauseous.  Seeing all white or all black in your field of vision.  Having cold, clammy skin.  If you fainted, get medical help right away.Call your local emergency services (911 in the [place] Do not drive yourself to the hospital. Follow these instructions at home: Pay attention to any changes in your symptoms. Take these actions to help with your condition:  Have someone stay with you until you feel stable.  Do not drive, use machinery, or play sports until your health care provider says it is okay.  Keep all follow-up visits as told by your health care provider. This is important.  If you start to feel like you might faint, lie down right away and raise (elevate) your feet above the level of your heart. Breathe deeply and steadily. Wait until all of the symptoms have passed.  Drink enough fluid to keep your urine clear or pale yellow.  If you are taking blood pressure or heart medicine, get up slowly and take several minutes to sit and then stand. This can reduce dizziness.  Take over-the-counter and prescription medicines only as told by your health care provider.  Get help right away if:  You have a severe headache.  You have unusual pain in your chest, abdomen, or back.  You are bleeding from your mouth or rectum, or you have black or tarry stool.  You have a very fast or irregular heartbeat (palpitations).  You have pain with breathing.  You faint once or repeatedly.  You have a seizure.  You are confused.  You have trouble walking.  You have severe weakness.  You have vision  problems. These symptoms may represent a serious problem that is an emergency. Do not wait to see if your symptoms will go away. Get medical help right away. Call your local emergency services (911 in the [place] Do not drive yourself to the hospital. This information is not intended to replace advice given to you by your health care provider. Make sure you discuss any questions you have with your health care provider. Document Released: 07/05/2005 Document Revised: 12/11/2015 Document Reviewed: 03/19/2015 Elsevier Interactive Patient Education  [employer]2018 Elsevier Inc.

## 2017-12-30 ENCOUNTER — Telehealth: Payer: Self-pay | Admitting: Pharmacist

## 2017-12-30 NOTE — Telephone Encounter (Signed)
PASS application for [name] assistance has been denied - pt will need to apply for Low Income Subsidy first. If he is denied, he will then qualify through the PASS program. Pt is aware and will apply for [employer].

## 2018-01-24 ENCOUNTER — Telehealth: Payer: Self-pay | Admitting: Internal Medicine

## 2018-01-24 NOTE — Telephone Encounter (Signed)
Spoke with patient's daughter - she is sending over [employer] denial letter.

## 2018-01-24 NOTE — Telephone Encounter (Signed)
Patient's daughter called clinic - they received a letter from [employer] stating that pt may not be eligible due to assets worth > 14k. She will fax this letter to us so we can forward it along to PASS. This does not sound like the official denial letter. She states pt received another letter stating the online application was incomplete. She will follow up with [employer] about this in case PASS requires the official denial letter from [employer] to cover [name].

## 2018-01-24 NOTE — Telephone Encounter (Signed)
New Message:      Pt's daughter is calling in reference to the pt and would like to speak with you and get your email address to send something.

## 2018-01-30 NOTE — Progress Notes (Signed)
Cardiology Office Note:    Date:  01/31/2018   ID:  Joseph [name], DOB 05-20-1941, MRN [id number]  PCP:  Dema Severin, NP  Cardiologist:  Yvonne Kendall, MD    Referring MD: Dema Severin, NP   Chief Complaint  Patient presents with  . Follow-up    Fatigue    History of Present Illness:    Joseph [name] is a 77 y.o. male with coronary artery disease status post anterior MI in 2004 treated with stent to the LAD and staged PCI with Taxus stent to the RCA, non-STEMI in January 2016 treated with DES to the mid LAD and DES to the mid and distal LCx, paroxysmal atrial fibrillation, hypertension, hyperlipidemia (statin intolerant), diabetes, prior stroke.  Last seen 10/31/17.  He complained of fatigue.  A TSH was normal.  A Nuclear stress test was low risk and demonstrated anteroseptal scar, EF 54.  Therefore, I had Joseph [name] stop his beta-blocker to see if his fatigue improved.  He was admitted in June 2019 with LGI bleed secondary to polypectomy during a recent polypectomy.  He required 2 units PRBCs and was ultimately cleared by GI to resume anticoagulation.    Joseph [name] returns for follow up.  He is here alone.  He continues to feel fatigued.  He notes some shortness of breath with activity.  He does not sleep well due to back pain and arthritic pain.  He denies orthopnea, PND.  He does have some lower extremity swelling after prolonged standing.  He denies chest pain.  He denies syncope.  He has not noticed much difference since stopping metoprolol in regards to his fatigue.  However, he does note his rash has improved.  Prior CV studies:   The following studies were reviewed today:  Nuclear stress test 11/14/17 Low risk stress nuclear study with a fixed anteroseptal perfusion defect and borderline reduction in left ventricular systolic function. EF 54  Echo 09/30/17 Mild concentric LVH, EF 55-60, normal wall motion, grade 2 diastolic dysfunction, mildly dilated ascending aorta (38 mm),  mild MR, mild LAE, PASP 26  Event monitor 06/09/16 NSR. No atrial fibrillation noted.   Nuclear stress test 04/08/16 Large, fixed anteroseptal perfusion defect with associated akinesis suggestive of scar. No significant reversible ischemia. LVEF 55%. This is an intermediate risk study based on infarct size (20%).  Cardiac Catheterization 11/13/14 LM distal 30-40 LAD mid stent patent; D1 proximal 80-90 (med rx recommended) LCx stents in the mid AV LCx patent; OM1 proximal 100; OM3 proximal 90 (small) RCA irregularities; PLV serial 50 (small); PDA mid 40-50, distal 30  Echo 10/31/14 Moderate concentric LVH, EF 60-65, normal wall motion, grade 1 diastolic dysfunction, ascending aorta 41 mm, moderate TR  Past Medical History:  Diagnosis Date  . Abdominal discomfort    Abdominal discomfort: EGD 2011 showed no significant abnormalities.  Possibly due to metformin.  . Allergic rhinitis   . Anticoagulated 05/30/2016  . CAD (coronary artery disease)    a. s/p MI in 2004:  stents to the LAD and staged stent RCA, EF 55%;  b.  NSTEMI (1/16):  LHC - mid LAD stent with diff dist restenosis, prox to mid Dx mod dsz jailed by stent, small OM1 99, mCFX 75, dCFX 80, mRCA stent ok, mPDA mild to mod dsz, EF 55% >> PCI:  Synergy DES to LAD; Synergy DES (x2) mid and dist CFX  . DDD (degenerative disc disease)    L4 to S1 degenerative disease and spondylolisthesis:  low back pain.  s/p back surgery  . DM (diabetes mellitus) (HCC)   . History of CVA (cerebrovascular accident)    Small CVA seen by head CT (incidental) in 1/13.  Carotid dopplers in 1/13 showed minimal disease  . History of Doppler ultrasound    Arterial dopplers (3/11): no evidence for significant PAD. ABIs 10/12: Normal.    . HLD (hyperlipidemia)   . HTN (hypertension)    had side effects with chlorthalidone  . Hx of cardiovascular stress test    Myoview (1/13): Small fixed apical septal defect with no ischemia, EF 58%.  [name]. Hx of  echocardiogram    Echo (3/11): EF 60%, normal wall motion, mild MR, mild left atrial enlargement, mildly dilated ascending aorta.  [name]. PAF (paroxysmal atrial fibrillation) (HCC) 05/29/2016  . Spondylolisthesis    L4-S1   Surgical Hx: The patient  has a past surgical history that includes Coronary stent placement (2004); Prostate surgery; Spinal fusion; left heart catheterization with coronary angiogram (N/A, 08/02/2014); coronary angiogram (11/13/2014); Colonoscopy w/ polypectomy (12/2017); and Colonoscopy (N/A, 12/26/2017).   Current Medications: Current Meds  Medication Sig  . acetaminophen (TYLENOL) 650 MG CR tablet Take 1,300 mg by mouth every 8 (eight) hours as needed for pain.  [name]. amLODipine (NORVASC) 10 MG tablet Take 10 mg by mouth daily.  [name]. apixaban (ELIQUIS) 5 MG TABS tablet Take 1 tablet (5 mg total) by mouth 2 (two) times daily. DO NOT TAKE FOR 3 MORE DAYS. MAY RESTART TAKING IT ON 01/01/2018.  [name]. Coenzyme Q10 100 MG capsule Take 1 capsule by mouth daily.  [name]. GARLIC PO Take 1 capsule by mouth 2 (two) times daily.  [name]. glipiZIDE (GLUCOTROL) 10 MG tablet Take 10 mg by mouth 2 (two) times daily before a meal.    . Insulin Detemir (LEVEMIR FLEXTOUCH) 100 UNIT/ML Pen Inject 30 Units into the skin 2 (two) times daily before a meal.   . isosorbide mononitrate (IMDUR) 60 MG 24 hr tablet Take 120 mg by mouth daily.  . nitroGLYCERIN (NITROSTAT) 0.4 MG SL tablet Place 0.4 mg under the tongue every 5 (five) minutes as needed for chest pain (x 3 doses).   . Omega-3 Fatty Acids (FISH OIL) 1000 MG CAPS Take 4 capsules (4,000 mg total) by mouth daily.  [name]. triamcinolone cream (KENALOG) 0.5 % Apply 1 application topically as needed for irritation.     Allergies:   Rosuvastatin and Zetia [ezetimibe]   Social History   Tobacco Use  . Smoking status: Never Smoker  . Smokeless tobacco: Never Used  Substance Use Topics  . Alcohol use: No  . Drug use: No     Family Hx: The patient's family history  includes Heart attack in his father and mother. There is no history of Stroke.  ROS:   Please see the history of present illness.    Review of Systems  Constitution: Positive for malaise/fatigue.  HENT: Positive for hearing loss.   Eyes: Positive for visual disturbance.  Respiratory: Positive for cough.   Hematologic/Lymphatic: Bruises/bleeds easily.  Skin: Positive for rash.  Musculoskeletal: Positive for back pain, joint pain and myalgias.   All other systems reviewed and are negative.   EKGs/Labs/Other Test Reviewed:    EKG:  EKG is not ordered today.    Recent Labs: 09/16/2017: Magnesium 2.0 11/14/2017: TSH 2.520 12/26/2017: ALT 13 12/28/2017: BUN 17; Creatinine, Ser 1.18; Hemoglobin 13.0; Platelets 182; Potassium 4.5; Sodium 139   From [facility] Tool Cholesterol, total 152.000  08/01/2017 HDL  36.000  08/01/2017 LDL    89.000  08/01/2017 Triglycerides   133.000  08/01/2017 Hemoglobin   13.300 g/  12/30/2017 Creatinine, Serum  1.180   12/28/2017 Potassium   4.500   12/28/2017 ALT (SGPT)   13.000  12/26/2017 TSH    2.520   11/14/2017 INR    1.780   12/26/2017 Platelets   217.000 x1  12/30/2017  Recent Lipid Panel Lab Results  Component Value Date/Time   CHOL 152 08/01/2017 08:05 AM   TRIG 133 08/01/2017 08:05 AM   HDL 36 (L) 08/01/2017 08:05 AM   CHOLHDL 4.2 08/01/2017 08:05 AM   CHOLHDL 3.5 05/30/2016 02:01 AM   LDLCALC 89 08/01/2017 08:05 AM   LDLDIRECT 83.0 10/28/2014 04:22 PM    Physical Exam:    VS:  BP 132/64   Pulse 72   Ht 6\' 2"  (1.88 m)   Wt 215 lb (97.5 kg)   SpO2 97%   BMI 27.60 kg/m     Wt Readings from Last 3 Encounters:  01/31/18 215 lb (97.5 kg)  12/27/17 221 lb 9 oz (100.5 kg)  11/14/17 216 lb (98 kg)     Physical Exam  Constitutional: He is oriented to person, place, and time. He appears well-developed and well-nourished. No distress.  HENT:  Head: Normocephalic and atraumatic.  Neck: Neck supple. No JVD present.  Cardiovascular: Normal rate,  regular rhythm, S1 normal and S2 normal.  No murmur heard. Pulmonary/Chest: Breath sounds normal. He has no rales.  Abdominal: Soft. There is no hepatomegaly.  Musculoskeletal: He exhibits no edema.  Neurological: He is alert and oriented to person, place, and time.  Skin: Skin is warm and dry.  Psychiatric: He has a normal mood and affect.    ASSESSMENT & PLAN:    Other fatigue He has chronic symptoms of fatigue.  He struggles with significant arthritis.  Question of this is contributing to some of his symptoms.  He denies a history of snoring.  Recent stress test demonstrated prior scar but no ischemia and was overall, low risk.  Echocardiogram in March demonstrated normal LV function.  He does describe some shortness of breath but there is no evidence of volume excess.  At this point, I do not believe he needs further cardiac testing.  He should continue follow-up with primary care.  If his symptoms progressively worsen over time, we can reconsider further cardiac testing.  Paroxysmal atrial fibrillation (HCC) Maintaining normal sinus rhythm.  He does note that his rash has cleared up since stopping metoprolol.  Question if he may have had a true allergy blockers.  Although he does not really notice much difference in his fatigue since stopping beta-blocker therapy, I suspect we should keep him off of the metoprolol.  I will give him diltiazem 30 mg once daily as needed for rapid palpitations.  Continue Apixaban.  -Diltiazem 30 mg daily as needed for rapid palpitations  Coronary artery disease of native artery of native heart with stable angina pectoris (HCC) History of myocardial infarction in 2004 treated with stenting to the LAD and RCA and subsequent myocardial infarction in 2016 treated with stenting of the LAD and LCx.  Recent nuclear stress test demonstrated scar but no ischemia and was low risk.  He denies chest pain.  Continue current medical therapy with amlodipine, nitrates.  He is  intolerant to statins.  He is not on aspirin as he is on Apixaban.  Pure hypercholesterolemia Continue follow-up with lipid clinic for PCSK9 inhibitor therapy.  Essential hypertension The patient's blood pressure is controlled on his current regimen.  Continue current therapy.    Dispo:  Return in about 6 months (around 08/03/2018) for Routine Follow Up, w/ Tereso Newcomer, PA-C.   Medication Adjustments/Labs and Tests Ordered: Current medicines are reviewed at length with the patient today.  Concerns regarding medicines are outlined above.  Tests Ordered: No orders of the defined types were placed in this encounter.  Medication Changes: Meds ordered this encounter  Medications  . diltiazem (CARDIZEM) 30 MG tablet    Sig: Take 1 tablet (30 mg total) by mouth as needed (Rapid Palpitations).    Dispense:  30 tablet    Refill:  5    Signed, Tereso Newcomer, PA-C  01/31/2018 8:58 AM    [facility] [address] [place], [place], [place]  [address] Phone: [phone]; Fax: [phone]

## 2018-01-31 ENCOUNTER — Encounter: Payer: Self-pay | Admitting: Physician Assistant

## 2018-01-31 ENCOUNTER — Ambulatory Visit (INDEPENDENT_AMBULATORY_CARE_PROVIDER_SITE_OTHER): Payer: Medicare Other | Admitting: Physician Assistant

## 2018-01-31 VITALS — BP 132/64 | HR 72 | Ht 74.0 in | Wt 215.0 lb

## 2018-01-31 DIAGNOSIS — E7800 Pure hypercholesterolemia, unspecified: Secondary | ICD-10-CM | POA: Diagnosis not present

## 2018-01-31 DIAGNOSIS — I480 Paroxysmal atrial fibrillation: Secondary | ICD-10-CM

## 2018-01-31 DIAGNOSIS — I25118 Atherosclerotic heart disease of native coronary artery with other forms of angina pectoris: Secondary | ICD-10-CM | POA: Diagnosis not present

## 2018-01-31 DIAGNOSIS — I10 Essential (primary) hypertension: Secondary | ICD-10-CM

## 2018-01-31 DIAGNOSIS — R5383 Other fatigue: Secondary | ICD-10-CM

## 2018-01-31 MED ORDER — DILTIAZEM HCL 30 MG PO TABS: 30.0000 mg | ORAL_TABLET | ORAL | 5 refills | Status: AC | PRN

## 2018-01-31 NOTE — Patient Instructions (Signed)
Medication Instructions: Your physician has recommended you make the following change in your medication:  START: Diltiazem 30 mg daily as needed for rapid palpitations   Continue all other medications   Labwork: None Ordered   Procedures/Testing: None Ordered   Follow-Up: Your physician recommends that you schedule a follow-up appointment in: 6 months with Tereso NewcomerScott Weaver PA-C   Any Additional Special Instructions Will Be Listed Below (If Applicable).     If you need a refill on your cardiac medications before your next appointment, please call your pharmacy.

## 2018-03-01 NOTE — Telephone Encounter (Signed)
Called PASS program and spoke with manager. They will see if they can use predetermination denial letter from [employer] rather than final denial letter. They will call back with determination.

## 2018-03-13 NOTE — Telephone Encounter (Signed)
Received approval letter for [name] through PASS program. Pt is aware and will contact them to set up shipment of [name].

## 2018-03-24 ENCOUNTER — Telehealth: Payer: Self-pay | Admitting: Pharmacist

## 2018-03-24 NOTE — Telephone Encounter (Signed)
Pt presents today for teaching of [name] injection. Pt demonstrates appropriate injection technique. He will get labs with his primary care physician after his 4th dose and have them faxed to our office.

## 2018-04-18 ENCOUNTER — Telehealth: Payer: Self-pay

## 2018-04-18 NOTE — Telephone Encounter (Signed)
   [facility] Pre-operative Risk Assessment    Request for surgical clearance:  1. What type of surgery is being performed? Cataract extraction w/intraocular lens implantation of the LEFT eye followed by the RIGHT eye   2. When is this surgery scheduled? "November 2019"   3. What type of clearance is required (medical clearance vs. Pharmacy clearance to hold med vs. Both)? Medical  4. Are there any medications that need to be held prior to surgery and how long? The requests states "Pt does not need to stop ANY medication."   5. Practice name and name of physician performing surgery? [facility] (Dr. Darleen Crocker)   6. What is your office phone number: [phone]    7.   What is your office fax number: [phone]  8.   Anesthesia type (None, local, MAC, general) ? Topical anesthesia with IV medication

## 2018-04-18 NOTE — Telephone Encounter (Signed)
   Primary Cardiologist: Yvonne Kendall, MD  Chart reviewed as part of pre-operative protocol coverage. Cataract extractions are recognized in guidelines as low risk surgeries that do not typically require specific preoperative testing or holding of blood thinner therapy. Therefore, given past medical history and time since last visit, based on ACC/AHA guidelines, Joseph [name] would be at acceptable risk for the planned procedure without further cardiovascular testing.   I will route this recommendation to the requesting party via Epic fax function and remove from pre-op pool.  Please call with questions.  [place], [place] 04/18/2018, 1:58 PM

## 2018-05-09 ENCOUNTER — Telehealth: Payer: Self-pay | Admitting: Internal Medicine

## 2018-05-09 NOTE — Telephone Encounter (Signed)
  Pt c/o medication issue:  1. Name of Medication: Alirocumab ([name]) 75 MG/ML SOPN  2. How are you currently taking this medication (dosage and times per day)? Inject 75 mg into the skin every 14 (fourteen) days  3. Are you having a reaction (difficulty breathing--STAT)?  Na  4. What is your medication issue? Pt has taken 4 shots so far and he is experiencing cold like symptoms and wondering if it could be coming from the medication

## 2018-05-09 NOTE — Telephone Encounter (Signed)
Spoke with pt - he has been sick for the past 7-10 days. He has been started on an antibiotic but is not noticing any improvement. He is experiencing muscle aches, coughing with mucus, and sore throat. He tested negative for pneumonia.   Discussed that muscle aches can be associated with PSK9i therapy, however coughing with mucus and sore throat typically are not. It sounds like he could potentially have a viral infection or the flu. Advised pt he can skip his next [name] shot, but to try resuming for his next scheduled dose after that. If symptoms return, he will call clinic.   Other lipid lowering options are very limited due to previous intolerances with atorvastatin 10mg  daily, lovastatin 20mg  daily, pravastatin 20mg  and 40mg  daily, and rosuvastatin 5mg  3x per week, so would like to keep pt on [name] therapy if possible and will try to rule out [name] as cause of patient's symptoms.

## 2018-06-08 ENCOUNTER — Telehealth: Payer: Self-pay

## 2018-06-08 DIAGNOSIS — E7800 Pure hypercholesterolemia, unspecified: Secondary | ICD-10-CM

## 2018-06-08 NOTE — Telephone Encounter (Signed)
Called pt regarding praluent labs that were ordered and scheduled them for 07/21/18. Pt also stated that they prefer to see their primary cardiologist instead of a PA.

## 2018-06-22 ENCOUNTER — Telehealth: Payer: Self-pay | Admitting: Pharmacist

## 2018-06-22 NOTE — Telephone Encounter (Signed)
Mailed out PASS application for pt for 2020 year. Is already scheduled for lipids on 07/21/18 to assess efficacy of [name], will need this for new prior auth form. Pt is aware.

## 2018-07-04 DIAGNOSIS — G4700 Insomnia, unspecified: Secondary | ICD-10-CM | POA: Insufficient documentation

## 2018-07-04 DIAGNOSIS — M79661 Pain in right lower leg: Secondary | ICD-10-CM | POA: Insufficient documentation

## 2018-07-20 ENCOUNTER — Telehealth: Payer: Self-pay

## 2018-07-20 NOTE — Telephone Encounter (Signed)
Called pt to ask for insurance changes to start Prior Authorization of praluent for the 2020 year but pt stated that he is having some difficulty with it and may not continue the medication and pt asked for [place] to not complete the pa until he checks with Dr. Algis Downs pt that if they do not wish to continue praluent, that repatha would be another medication that they can try and to speak with Dr. About it and see if that would be a good option for the pt. Pt started feeling sick for 3+ days has a rash/breaking out & that he may not want [place] to continue the praluent.

## 2018-07-21 ENCOUNTER — Other Ambulatory Visit: Payer: Medicare Other | Admitting: *Deleted

## 2018-07-21 DIAGNOSIS — E7800 Pure hypercholesterolemia, unspecified: Secondary | ICD-10-CM

## 2018-07-21 LAB — LIPID PANEL
Chol/HDL Ratio: 3 ratio (ref 0.0–5.0)
Cholesterol, Total: 114 mg/dL (ref 100–199)
HDL: 38 mg/dL — ABNORMAL LOW
LDL Calculated: 50 mg/dL (ref 0–99)
Triglycerides: 130 mg/dL (ref 0–149)
VLDL Cholesterol Cal: 26 mg/dL (ref 5–40)

## 2018-07-21 LAB — HEPATIC FUNCTION PANEL
ALK PHOS: 57 IU/L (ref 39–117)
ALT: 17 IU/L (ref 0–44)
AST: 13 IU/L (ref 0–40)
Albumin: 4.6 g/dL (ref 3.5–4.8)
Bilirubin Total: 0.6 mg/dL (ref 0.0–1.2)
Bilirubin, Direct: 0.18 mg/dL (ref 0.00–0.40)
Total Protein: 6.6 g/dL (ref 6.0–8.5)

## 2018-07-21 NOTE — Telephone Encounter (Signed)
LMOM for pt to discuss lipid lowering options.

## 2018-07-24 NOTE — Telephone Encounter (Signed)
Spoke with pt - he reports that for the first few days after each [name] injection, he feels very poorly. His legs and hips become very weak. He has also noticed a rash on his hairline, head, ears, and legs that began after he started injections.    He is already intolerant to atorvastatin 10mg  daily, lovastatin 20mg  daily, pravastatin 20mg  and 40mg  daily, rosuvastatin 5mg  3x per week, and Zetia (myalgias with each).  Will await FDA approval of bempedoic acid in March.

## 2018-08-04 ENCOUNTER — Ambulatory Visit: Payer: Medicare Other | Admitting: Physician Assistant

## 2018-08-09 DIAGNOSIS — M5116 Intervertebral disc disorders with radiculopathy, lumbar region: Secondary | ICD-10-CM | POA: Insufficient documentation

## 2018-08-22 DIAGNOSIS — K2970 Gastritis, unspecified, without bleeding: Secondary | ICD-10-CM | POA: Insufficient documentation

## 2018-08-31 ENCOUNTER — Telehealth: Payer: Self-pay

## 2018-08-31 NOTE — Telephone Encounter (Signed)
-----   Message from Yvonne Kendall, MD sent at 08/31/2018  7:21 AM EST ----- Hemoglobin A1c elevated, consistent with suboptimal control of DM.  Creatinine slightly above baseline.  I encourage him to stay well hydrated and speak with his PCP about improving glycemic control.

## 2018-08-31 NOTE — Telephone Encounter (Signed)
Called patient to discuss lab results that were reviewed by Dr. Okey DupreEnd. Pt verbalized understanding and had no further questions at this time. I confirmed appt for next Monday.  Advised pt to call for any further questions or concerns.

## 2018-09-04 ENCOUNTER — Ambulatory Visit (INDEPENDENT_AMBULATORY_CARE_PROVIDER_SITE_OTHER): Payer: Medicare Other | Admitting: Cardiovascular Disease

## 2018-09-04 ENCOUNTER — Encounter (INDEPENDENT_AMBULATORY_CARE_PROVIDER_SITE_OTHER): Payer: Self-pay

## 2018-09-04 ENCOUNTER — Encounter: Payer: Self-pay | Admitting: Cardiovascular Disease

## 2018-09-04 VITALS — BP 130/68 | HR 59 | Ht 74.0 in | Wt 218.1 lb

## 2018-09-04 DIAGNOSIS — I2510 Atherosclerotic heart disease of native coronary artery without angina pectoris: Secondary | ICD-10-CM

## 2018-09-04 DIAGNOSIS — I480 Paroxysmal atrial fibrillation: Secondary | ICD-10-CM

## 2018-09-04 NOTE — Progress Notes (Signed)
Cardiology Office Note:    Date:  09/04/2018   ID:  Joseph RiversDonald W [name], DOB 1941/06/11, MRN [id number]  PCP:  Dema SeverinYork, Regina F, NP  Cardiologist:  Kristeen MissPhilip Nahser, MD  Electrophysiologist:  None   Referring MD: Dema SeverinYork, Regina F, NP   Chief Complaint  Patient presents with  . Coronary Artery Disease     Feb. 17, 2020    Joseph [name] is a 78 y.o. male with a hx of coronary artery disease.  He is status post anterior wall myocardial infarction in 2004 treated with a stent to the LAD with a staged PCI to the right coronary artery.  Also had an non-ST segment elevation myocardial infarction in January 2016 treated treated with a DES to the mid LAD and a DES to the mid and distal circumflex artery. He also has a history of paroxysmal atrial fibrillation, hypertension, hyperlipidemia.  He is intolerant to statins.  He has a history of diabetes mellitus and a previous stroke.  He is a previous patient of Dr. and.  He was last seen in our office in July, 2019 by Tereso NewcomerScott Weaver, PA.  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  Denies any chest pain. Rides a stationary bike - does not have any issues with that  Follows a low chol diet .   Has had a rash across his body for the past 2 years .     Might be related to the onset of the Eliquis .  He thinks the rash is worse since he started Coreg.   Past Medical History:  Diagnosis Date  . Abdominal discomfort    Abdominal discomfort: EGD 2011 showed no significant abnormalities.  Possibly due to metformin.  . Allergic rhinitis   . Anticoagulated 05/30/2016  . CAD (coronary artery disease)    a. s/p MI in 2004:  stents to the LAD and staged stent RCA, EF 55%;  b.  NSTEMI (1/16):  LHC - mid LAD stent with diff dist restenosis, prox to mid Dx mod dsz jailed by stent, small OM1 99, mCFX 75, dCFX 80, mRCA stent ok, mPDA mild to mod dsz, EF 55% >> PCI:  Synergy DES to LAD; Synergy DES (x2) mid and dist CFX  . DDD (degenerative disc disease)    L4  to S1 degenerative disease and spondylolisthesis: low back pain.  s/p back surgery  . DM (diabetes mellitus) (HCC)   . History of CVA (cerebrovascular accident)    Small CVA seen by head CT (incidental) in 1/13.  Carotid dopplers in 1/13 showed minimal disease  . History of Doppler ultrasound    Arterial dopplers (3/11): no evidence for significant PAD. ABIs 10/12: Normal.    . HLD (hyperlipidemia)   . HTN (hypertension)    had side effects with chlorthalidone  . Hx of cardiovascular stress test    Myoview (1/13): Small fixed apical septal defect with no ischemia, EF 58%.  [name]. Hx of echocardiogram    Echo (3/11): EF 60%, normal wall motion, mild MR, mild left atrial enlargement, mildly dilated ascending aorta.  [name]. PAF (paroxysmal atrial fibrillation) (HCC) 05/29/2016  . Spondylolisthesis    L4-S1    Past Surgical History:  Procedure Laterality Date  . COLONOSCOPY N/A 12/26/2017   Procedure: COLONOSCOPY;  Surgeon: Benancio DeedsArmbruster, Steven P, MD;  [facility];  Service: Gastroenterology;  Laterality: N/A;  . COLONOSCOPY W/ POLYPECTOMY  12/2017  . CORONARY ANGIOGRAM  11/13/2014   Procedure: CORONARY ANGIOGRAM;  Surgeon: Freida Busmanalton  Alford Highland, MD;  Location: [facility] CATH LAB;  Service: Cardiovascular;;  . CORONARY STENT PLACEMENT  2004   x2 LAD and RCA   . LEFT HEART CATHETERIZATION WITH CORONARY ANGIOGRAM N/A 08/02/2014   Procedure: LEFT HEART CATHETERIZATION WITH CORONARY ANGIOGRAM;  Surgeon: Corky Crafts, MD;  Location: [facility] CATH LAB;  Service: Cardiovascular;  Laterality: N/A;  . PROSTATE SURGERY    . SPINAL FUSION     L4-S1    Current Medications: Current Meds  Medication Sig  . acetaminophen (TYLENOL) 650 MG CR tablet Take 1,300 mg by mouth every 8 (eight) hours as needed for pain.  [name] amLODipine (NORVASC) 10 MG tablet Take 10 mg by mouth daily.  [name] apixaban (ELIQUIS) 5 MG TABS tablet Take 1 tablet (5 mg total) by mouth 2 (two) times daily. DO NOT TAKE FOR 3 MORE DAYS. MAY RESTART  TAKING IT ON 01/01/2018.  . carvedilol (COREG) 3.125 MG tablet Take 1 tablet by mouth 2 (two) times daily.  . Coenzyme Q10 100 MG capsule Take 1 capsule by mouth daily.  [name] diltiazem (CARDIZEM) 30 MG tablet Take 1 tablet (30 mg total) by mouth as needed (Rapid Palpitations).  . fluticasone (FLONASE) 50 MCG/ACT nasal spray Place 2 sprays into both nostrils daily as needed for allergies or rhinitis.   [name] GARLIC PO Take 1 capsule by mouth 2 (two) times daily.  [name] glipiZIDE (GLUCOTROL) 10 MG tablet Take 10 mg by mouth 2 (two) times daily before a meal.    . insulin degludec (TRESIBA FLEXTOUCH) 100 UNIT/ML SOPN FlexTouch Pen Inject 32 Units as directed daily.  . isosorbide mononitrate (IMDUR) 60 MG 24 hr tablet Take 120 mg by mouth daily.  [name] lisinopril (PRINIVIL,ZESTRIL) 20 MG tablet Take 1 tablet (20 mg total) by mouth daily.  . nitroGLYCERIN (NITROSTAT) 0.4 MG SL tablet Place 0.4 mg under the tongue every 5 (five) minutes as needed for chest pain (x 3 doses).   . Omega-3 Fatty Acids (FISH OIL) 1000 MG CAPS Take 4 capsules (4,000 mg total) by mouth daily.  [name] triamcinolone cream (KENALOG) 0.5 % Apply 1 application topically as needed for irritation.     Allergies:   [name] [alirocumab]; Rosuvastatin; and Zetia [ezetimibe]   Social History   Socioeconomic History  . Marital status: Divorced    Spouse name: Not on file  . Number of children: 2  . Years of education: Not on file  . Highest education level: Not on file  Occupational History  . Occupation: retired  Engineer, production  . Financial resource strain: Not on file  . Food insecurity:    Worry: Not on file    Inability: Not on file  . Transportation needs:    Medical: Not on file    Non-medical: Not on file  Tobacco Use  . Smoking status: Never Smoker  . Smokeless tobacco: Never Used  Substance and Sexual Activity  . Alcohol use: No  . Drug use: No  . Sexual activity: Not on file  Lifestyle  . Physical activity:    Days per week:  Not on file    Minutes per session: Not on file  . Stress: Not on file  Relationships  . Social connections:    Talks on phone: Not on file    Gets together: Not on file    Attends religious service: Not on file    Active member of club or organization: Not on file    Attends meetings of clubs or organizations: Not  on file    Relationship status: Not on file  Other Topics Concern  . Not on file  Social History Narrative  . Not on file     Family History: The patient's family history includes Heart attack in his father and mother. There is no history of Stroke.  ROS:   Please see the history of present illness.     All other systems reviewed and are negative.  EKGs/Labs/Other Studies Reviewed:    The following studies were reviewed today:   EKG:   September 04, 2018: Sinus bradycardia at a heart rate of 59.  He has a first-degree AV block.  EKG is otherwise normal. Recent Labs: 09/16/2017: Magnesium 2.0 11/14/2017: TSH 2.520 12/28/2017: BUN 17; Creatinine, Ser 1.18; Hemoglobin 13.0; Platelets 182; Potassium 4.5; Sodium 139 07/21/2018: ALT 17  Recent Lipid Panel    Component Value Date/Time   CHOL 114 07/21/2018 0747   TRIG 130 07/21/2018 0747   HDL 38 (L) 07/21/2018 0747   CHOLHDL 3.0 07/21/2018 0747   CHOLHDL 3.5 05/30/2016 0201   VLDL 24 05/30/2016 0201   LDLCALC 50 07/21/2018 0747   LDLDIRECT 83.0 10/28/2014 1622    Physical Exam:    VS:  BP 130/68   Pulse (!) 59   Ht 6\' 2"  (1.88 m)   Wt 218 lb 1.9 oz (98.9 kg)   SpO2 93%   BMI 28.00 kg/m     Wt Readings from Last 3 Encounters:  09/04/18 218 lb 1.9 oz (98.9 kg)  01/31/18 215 lb (97.5 kg)  12/27/17 221 lb 9 oz (100.5 kg)     GEN: Elderly gentleman, no acute distress. HEENT: Normal NECK: No JVD; No carotid bruits LYMPHATICS: No lymphadenopathy CARDIAC: RR ,  Soft systolic murmur  RESPIRATORY:  Clear to auscultation without rales, wheezing or rhonchi  ABDOMEN: Soft, non-tender,  non-distended MUSCULOSKELETAL:  No edema; No deformity  SKIN: Reddish, patchy rash primarily on his legs.  He has a few areas on his back.  Lesions look consistent with eczema. NEUROLOGIC:  Alert and oriented x 3 PSYCHIATRIC:  Normal affect   ASSESSMENT:    1. Paroxysmal atrial fibrillation (HCC)   2. Coronary artery disease involving native coronary artery of native heart without angina pectoris    PLAN:    In order of problems listed above:  1. 1.  Coronary artery disease: The patient has a history of CAD.  He is done well and is not having any episodes of angina.  He rides a stationary bike without too much trouble.  2.  Paroxysmal atrial fibrillation: The patient is on Eliquis.  He remains in normal sinus rhythm today.   3.  Skin rash: The patient thought that he might be allergic to 1 of his drugs.  His rash actually appears to be more like eczema.  I offered to change his Eliquis to Xarelto but he wanted to just stick with his with Eliquis for now.  In 6 months for follow-up visit.   Medication Adjustments/Labs and Tests Ordered: Current medicines are reviewed at length with the patient today.  Concerns regarding medicines are outlined above.  Orders Placed This Encounter  Procedures  . Lipid Profile  . Basic Metabolic Panel (BMET)  . Hepatic function panel  . EKG 12-Lead   No orders of the defined types were placed in this encounter.   Patient Instructions  Medication Instructions:  Your physician recommends that you continue on your current medications as directed. Please refer to the Current  Medication list given to you today.  If you need a refill on your cardiac medications before your next appointment, please call your pharmacy.   Lab work: Your physician recommends that you return for lab work in: 6 months on the day of or a few days before your office visit with Dr. Elease Hashimoto.  You will need to FAST for this appointment - nothing to eat or drink after midnight  the night before except water.    Testing/Procedures: None Ordered   Follow-Up: At [facility], you and your health needs are our priority.  As part of our continuing mission to provide you with exceptional heart care, we have created designated Provider Care Teams.  These Care Teams include your primary Cardiologist (physician) and Advanced Practice Providers (APPs -  Physician Assistants and Nurse Practitioners) who all work together to provide you with the care you need, when you need it. You will need a follow up appointment in:  6 months.  Please call our office 2 months in advance to schedule this appointment.  You may see Dr. Elease Hashimoto or one of the following Advanced Practice Providers on your designated Care Team: Tereso Newcomer, PA-C [place], [place] . Berton Bon, NP       Signed, Kristeen Miss, MD  09/04/2018 9:54 AM    [facility]

## 2018-09-04 NOTE — Patient Instructions (Signed)
Medication Instructions:  Your physician recommends that you continue on your current medications as directed. Please refer to the Current Medication list given to you today.  If you need a refill on your cardiac medications before your next appointment, please call your pharmacy.     Lab work: Your physician recommends that you return for lab work in: 6 months on the day of or a few days before your office visit with Dr. Nahser.  You will need to FAST for this appointment - nothing to eat or drink after midnight the night before except water.     Testing/Procedures: None Ordered    Follow-Up: At [facility], you and your health needs are our priority.  As part of our continuing mission to provide you with exceptional heart care, we have created designated Provider Care Teams.  These Care Teams include your primary Cardiologist (physician) and Advanced Practice Providers (APPs -  Physician Assistants and Nurse Practitioners) who all work together to provide you with the care you need, when you need it. You will need a follow up appointment in:  6 months.  Please call our office 2 months in advance to schedule this appointment.  You may see Dr. Nahser or one of the following Advanced Practice Providers on your designated Care Team: Scott Weaver, PA-C [place] Bhagat, PA-C . Janine Hammond, NP   

## 2018-10-27 ENCOUNTER — Telehealth: Payer: Self-pay | Admitting: Pharmacist

## 2018-10-27 NOTE — Telephone Encounter (Signed)
Received [employer] Eliquis application for patient in the mail. This has been filled out and faxed. Pt's daughter, Kathrin Penner, who sent in application has been made aware [phone]).

## 2018-10-30 NOTE — Telephone Encounter (Signed)
**Note De-Identified Serafina Topham Obfuscation** We rewcieved a ;lette from [employer] stating that they have approved the pt for pt asst with Eliquis. Approval good from 10/27/2018-07/19/2019 App Case# [id number]  The letter states that they have notified the pt of this approval.

## 2018-11-02 ENCOUNTER — Telehealth: Payer: Self-pay | Admitting: Pharmacist

## 2018-11-02 DIAGNOSIS — E7800 Pure hypercholesterolemia, unspecified: Secondary | ICD-10-CM

## 2018-11-02 NOTE — Telephone Encounter (Signed)
Called pt to discuss starting Nexletol for his cholesterol. Labs were last checked in January and LDL was excellent, however pt was still taking [name] injections at that time and has since stopped due to intolerance. He is also intolerant to atorvastatin 10mg  daily, lovastatin 20mg  daily, pravastatin 20mg  and 40mg  daily, rosuvastatin 5mg  3x per week, and Zetia (myalgias with each).  Pt states he wants his cholesterol checked again before starting something new, since he is taking metamucil and garlic currently for his cholesterol. States his PCP will check labs in the next few months and if LDL is above goal, he is willing to try Nexletol.

## 2018-11-07 ENCOUNTER — Other Ambulatory Visit: Payer: Self-pay

## 2018-11-07 ENCOUNTER — Telehealth: Payer: Self-pay | Admitting: Cardiovascular Disease

## 2018-11-07 ENCOUNTER — Telehealth (INDEPENDENT_AMBULATORY_CARE_PROVIDER_SITE_OTHER): Payer: Medicare Other | Admitting: Cardiovascular Disease

## 2018-11-07 VITALS — BP 157/80 | HR 57 | Ht 74.0 in | Wt 218.0 lb

## 2018-11-07 DIAGNOSIS — I200 Unstable angina: Secondary | ICD-10-CM

## 2018-11-07 DIAGNOSIS — I480 Paroxysmal atrial fibrillation: Secondary | ICD-10-CM

## 2018-11-07 DIAGNOSIS — I10 Essential (primary) hypertension: Secondary | ICD-10-CM | POA: Diagnosis not present

## 2018-11-07 DIAGNOSIS — Z7189 Other specified counseling: Secondary | ICD-10-CM

## 2018-11-07 MED ORDER — POTASSIUM CHLORIDE ER 10 MEQ PO TBCR: 10.0000 meq | EXTENDED_RELEASE_TABLET | Freq: Every day | ORAL | 11 refills | Status: DC

## 2018-11-07 MED ORDER — HYDROCHLOROTHIAZIDE 25 MG PO TABS: 25.0000 mg | ORAL_TABLET | Freq: Every day | ORAL | 11 refills | Status: DC

## 2018-11-07 NOTE — Telephone Encounter (Signed)
New Message:    Patient calling he is having some burning in his chest, and patient had to EMS last night. Please call patient

## 2018-11-07 NOTE — Patient Instructions (Addendum)
Medication Instructions:  Your physician has recommended you make the following change in your medication:  START HCTZ (Hydrochlorothiazide) 25 mg once daily START Kdur (Potassium chloride) 10 mEq once daily  If you need a refill on your cardiac medications before your next appointment, please call your pharmacy.    Lab work: None Ordered    Testing/Procedures: None Ordered    Follow-Up: Your physician recommends that you schedule a follow-up appointment in: 1 week for virtual visit - Nurse will call you to schedule Keep a BP log for [place] to review at your visit

## 2018-11-07 NOTE — Telephone Encounter (Signed)
YOUR CARDIOLOGY TEAM HAS ARRANGED FOR AN E-VISIT FOR YOUR APPOINTMENT - PLEASE REVIEW IMPORTANT INFORMATION BELOW SEVERAL DAYS PRIOR TO YOUR APPOINTMENT  Due to the recent COVID-19 pandemic, we are transitioning in-person office visits to tele-medicine visits in an effort to decrease unnecessary exposure to our patients, their families, and staff. These visits are billed to your insurance just like a normal visit is. We also encourage you to sign up for MyChart if you have not already done so. You will need a smartphone if possible. For patients that do not have this, we can still complete the visit using a regular telephone but do prefer a smartphone to enable video when possible. You may have a family member that lives with you that can help. If possible, we also ask that you have a blood pressure cuff and scale at home to measure your blood pressure, heart rate and weight prior to your scheduled appointment. Patients with clinical needs that need an in-person evaluation and testing will still be able to come to the office if absolutely necessary. If you have any questions, feel free to call our office.     YOUR PROVIDER WILL BE USING THE FOLLOWING PLATFORM TO COMPLETE YOUR VISIT: Telephone . IF USING MYCHART - How to Download the MyChart App to Your SmartPhone   - If Apple, go to [employer] Store and type in MyChart in the search bar and download the app. If Android, ask patient to go to [employer] Play Store and type in [place] in the search bar and download the app. The app is free but as with any other app downloads, your phone may require you to verify saved payment information or Apple/Android password.  - You will need to then log into the app with your MyChart username and password, and select [facility] as your healthcare provider to link the account.  - When it is time for your visit, go to the MyChart app, find appointments, and click Begin Video Visit. Be sure to Select Allow for your device to  access the Microphone and Camera for your visit. You will then be connected, and your provider will be with you shortly.  **If you have any issues connecting or need assistance, please contact MyChart service desk [phone])**  **If using a computer, in order to ensure the best quality for your visit, you will need to use either of the following Internet Browsers: [employer] or [employer]**  . IF USING DOXIMITY or DOXY.ME - The staff will give you instructions on receiving your link to join the meeting the day of your visit.      You will receive a telephone call from one of our [facility] team members - your caller ID may say "Unknown caller." If this is a video visit, we will walk you through how to get the video launched on your phone. We will remind you check your blood pressure, heart rate and weight prior to your scheduled appointment. If you have an Apple Watch or Kardia, please upload any pertinent ECG strips the day before or morning of your appointment to MyChart. Our staff will also make sure you have reviewed the consent and agree to move forward with your scheduled tele-health visit.     THE DAY OF YOUR APPOINTMENT  Approximately 15 minutes prior to your scheduled appointment, you will receive a telephone call from one of Heart[facility] team - your caller ID may say "Unknown caller."  Our staff will confirm medications, vital signs  for the day and any symptoms you may be experiencing. Please have this information available prior to the time of visit start. It may also be helpful for you to have a pad of paper and pen handy for any instructions given during your visit. They will also walk you through joining the smartphone meeting if this is a video visit.    CONSENT FOR TELE-HEALTH VISIT - PLEASE REVIEW  I hereby voluntarily request, consent and authorize [facility] and its employed or contracted physicians, physician assistants, nurse practitioners or other  licensed health care professionals (the Practitioner), to provide me with telemedicine health care services (the "Services") as deemed necessary by the treating Practitioner. I acknowledge and consent to receive the Services by the Practitioner via telemedicine. I understand that the telemedicine visit will involve communicating with the Practitioner through live audiovisual communication technology and the disclosure of certain medical information by electronic transmission. I acknowledge that I have been given the opportunity to request an in-person assessment or other available alternative prior to the telemedicine visit and am voluntarily participating in the telemedicine visit.  I understand that I have the right to withhold or withdraw my consent to the use of telemedicine in the course of my care at any time, without affecting my right to future care or treatment, and that the Practitioner or I may terminate the telemedicine visit at any time. I understand that I have the right to inspect all information obtained and/or recorded in the course of the telemedicine visit and may receive copies of available information for a reasonable fee.  I understand that some of the potential risks of receiving the Services via telemedicine include:  [name] Delay or interruption in medical evaluation due to technological equipment failure or disruption; . Information transmitted may not be sufficient (e.g. poor resolution of images) to allow for appropriate medical decision making by the Practitioner; and/or  . In rare instances, security protocols could fail, causing a breach of personal health information.  Furthermore, I acknowledge that it is my responsibility to provide information about my medical history, conditions and care that is complete and accurate to the best of my ability. I acknowledge that Practitioner's advice, recommendations, and/or decision may be based on factors not within their control, such as  incomplete or inaccurate data provided by me or distortions of diagnostic images or specimens that may result from electronic transmissions. I understand that the practice of medicine is not an exact science and that Practitioner makes no warranties or guarantees regarding treatment outcomes. I acknowledge that I will receive a copy of this consent concurrently upon execution via email to the email address I last provided but may also request a printed copy by calling the office of [facility].    I understand that my insurance will be billed for this visit.   I have read or had this consent read to me. . I understand the contents of this consent, which adequately explains the benefits and risks of the Services being provided via telemedicine.  . I have been provided ample opportunity to ask questions regarding this consent and the Services and have had my questions answered to my satisfaction. . I give my informed consent for the services to be provided through the use of telemedicine in my medical care  By participating in this telemedicine visit I agree to the above.

## 2018-11-07 NOTE — Telephone Encounter (Signed)
Spoke with patient who states he woke up with burning in chest, heart racing and felt pulse in his head that felt irregular.  Called EMS and felt better after sitting up and having them stay with him for a while EMS did ekg which they told patient looked "perfect"; states when he tried to go back to sleep it occurred 2 more times and felt better after he sat up for awhile States he currently feels weak, denies chest discomfort.  States "I feel a little something in my chest like a burn"; denies hx of GERD States burning occurs in left side of chest rather than in his throat. States he cannot walk from his house to garage without feeling SOB and feeling heart race.  Rides a stationary bike regularly but did not ride yesterday. Has not taken diltiazem. Having some issues with constipation and states he has started Metamucil and admits he is probably not drinking enough water. Takes Imdur 120 mg daily, has not taken any NTG. When I asked what he thinks about his symptoms related to a fib or previous symptoms that led to PCI he states that his heart feels fast and irregular. Has not taken BP yet today but it was high last night. I advised that I will forward to Dr. Elease Hashimoto for advice and call him back later. He verbalized understanding and agreement and thanked me for the call.

## 2018-11-07 NOTE — Progress Notes (Signed)
Virtual Visit via Telephone Note   This visit type was conducted due to national recommendations for restrictions regarding the COVID-19 Pandemic (e.g. social distancing) in an effort to limit this patient's exposure and mitigate transmission in our community.  Due to his co-morbid illnesses, this patient is at least at moderate risk for complications without adequate follow up.  This format is felt to be most appropriate for this patient at this time.  The patient did not have access to video technology/had technical difficulties with video requiring transitioning to audio format only (telephone).  All issues noted in this document were discussed and addressed.  No physical exam could be performed with this format.  Please refer to the patient's chart for his  consent to [facility].   Evaluation Performed:  Follow-up visit  Date:  11/07/2018   ID:  Joseph RiversDonald W [name], DOB 1941/06/22, MRN [id number]  Patient Location: Home Provider Location: Home   PCP:  Dema SeverinYork, Regina F, NP  Cardiologist:  Kristeen MissPhilip Nahser, MD  Electrophysiologist:  None   Chief Complaint:  Irregular HR   History of Present Illness:    Joseph NissenDonald W Remington is a 78 y.o. male with a history of coronary artery disease and also paroxysmal atrial fibrillation.  He recently has been having increasing episodes of palpitations.     Has been having more fatigue with waking.   Walks to his garage and back and gets "tuckered out"  Has noticed more heart racing last night Lasted about several minutes.   Couldn't sleep. Felt uncomfortable  Burning his chest , similar to his previous angina  Felt hot all over BP was high,   Gradually resolved when he sat up .  Did not take any NTG or dilt.   Still feels weak.   Does not know if his HR is fast.  HR is now 57.    The patient does not have symptoms concerning for COVID-19 infection (fever, chills, cough, or new shortness of breath).    Past Medical History:  Diagnosis  Date  . Abdominal discomfort    Abdominal discomfort: EGD 2011 showed no significant abnormalities.  Possibly due to metformin.  . Allergic rhinitis   . Anticoagulated 05/30/2016  . CAD (coronary artery disease)    a. s/p MI in 2004:  stents to the LAD and staged stent RCA, EF 55%;  b.  NSTEMI (1/16):  LHC - mid LAD stent with diff dist restenosis, prox to mid Dx mod dsz jailed by stent, small OM1 99, mCFX 75, dCFX 80, mRCA stent ok, mPDA mild to mod dsz, EF 55% >> PCI:  Synergy DES to LAD; Synergy DES (x2) mid and dist CFX  . 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  s/p back surgery  . DM (diabetes mellitus) (HCC)   . History of CVA (cerebrovascular accident)    Small CVA seen by head CT (incidental) in 1/13.  Carotid dopplers in 1/13 showed minimal disease  . History of Doppler ultrasound    Arterial dopplers (3/11): no evidence for significant PAD. ABIs 10/12: Normal.    . HLD (hyperlipidemia)   . HTN (hypertension)    had side effects with chlorthalidone  . Hx of cardiovascular stress test    Myoview (1/13): Small fixed apical septal defect with no ischemia, EF 58%.  Joseph [name]. Hx of echocardiogram    Echo (3/11): EF 60%, normal wall motion, mild MR, mild left atrial  enlargement, mildly dilated ascending aorta.  Joseph [name] PAF (paroxysmal atrial fibrillation) (HCC) 05/29/2016  . Spondylolisthesis    L4-S1   Past Surgical History:  Procedure Laterality Date  . COLONOSCOPY N/A 12/26/2017   Procedure: COLONOSCOPY;  Surgeon: Benancio Deeds, MD;  Location: [facility] ENDOSCOPY;  Service: Gastroenterology;  Laterality: N/A;  . COLONOSCOPY W/ POLYPECTOMY  12/2017  . CORONARY ANGIOGRAM  11/13/2014   Procedure: CORONARY ANGIOGRAM;  Surgeon: Laurey Morale, MD;  Location: [facility] CATH LAB;  Service: Cardiovascular;;  . CORONARY STENT PLACEMENT  2004   x2 LAD and RCA   . LEFT HEART CATHETERIZATION WITH CORONARY ANGIOGRAM N/A 08/02/2014   Procedure: LEFT  HEART CATHETERIZATION WITH CORONARY ANGIOGRAM;  Surgeon: Corky Crafts, MD;  Location[facility] CATH LAB;  Service: Cardiovascular;  Laterality: N/A;  . PROSTATE SURGERY    . SPINAL FUSION     L4-S1     Current Meds  Medication Sig  . acetaminophen (TYLENOL) 650 MG CR tablet Take 1,300 mg by mouth every 8 (eight) hours as needed for pain.  Joseph [name] amLODipine (NORVASC) 10 MG tablet Take 10 mg by mouth daily.  Joseph [name] apixaban (ELIQUIS) 5 MG TABS tablet Take 1 tablet (5 mg total) by mouth 2 (two) times daily. DO NOT TAKE FOR 3 MORE DAYS. MAY RESTART TAKING IT ON 01/01/2018. (Patient taking differently: Take 5 mg by mouth 2 (two) times daily. )  . carvedilol (COREG) 3.125 MG tablet Take 1 tablet by mouth 2 (two) times daily.  . Coenzyme Q10 100 MG capsule Take 1 capsule by mouth daily.  Joseph [name] diltiazem (CARDIZEM) 30 MG tablet Take 1 tablet (30 mg total) by mouth as needed (Rapid Palpitations).  . fluticasone (FLONASE) 50 MCG/ACT nasal spray Place 2 sprays into both nostrils daily as needed for allergies or rhinitis.   Joseph [name] gabapentin (NEURONTIN) 300 MG capsule Take 300 mg by mouth at bedtime as needed (back pain).  Joseph [name] GARLIC PO Take 1 capsule by mouth 2 (two) times daily.  Joseph [name] glipiZIDE (GLUCOTROL) 10 MG tablet Take 10 mg by mouth 2 (two) times daily before a meal.    . ibuprofen (ADVIL) 200 MG tablet Take 400 mg by mouth at bedtime as needed (back pain).  . Insulin Degludec 100 UNIT/ML SOLN Inject 22 Units into the skin every morning.  . isosorbide mononitrate (IMDUR) 60 MG 24 hr tablet Take 120 mg by mouth daily.  Joseph [name] lisinopril (PRINIVIL,ZESTRIL) 20 MG tablet Take 1 tablet (20 mg total) by mouth daily.  . nitroGLYCERIN (NITROSTAT) 0.4 MG SL tablet Place 0.4 mg under the tongue every 5 (five) minutes as needed for chest pain (x 3 doses).   . Omega-3 Fatty Acids (FISH OIL) 1000 MG CAPS Take 4 capsules (4,000 mg total) by mouth daily.  Joseph [name] triamcinolone cream (KENALOG) 0.5 % Apply 1 application topically as needed for  irritation.     Allergies:   [name] [alirocumab]; Rosuvastatin; and Zetia [ezetimibe]   Social History   Tobacco Use  . Smoking status: Never Smoker  . Smokeless tobacco: Never Used  Substance Use Topics  . Alcohol use: No  . Drug use: No     Family Hx: The patient's family history includes Heart attack in his father and mother. There is no history of Stroke.  ROS:   Please see the history of present illness.     All other systems reviewed and are negative.   Prior CV studies:   The following studies were reviewed today:    Labs/Other  Tests and Data Reviewed:    EKG:    Recent Labs: 11/14/2017: TSH 2.520 12/28/2017: BUN 17; Creatinine, Ser 1.18; Hemoglobin 13.0; Platelets 182; Potassium 4.5; Sodium 139 07/21/2018: ALT 17   Recent Lipid Panel Lab Results  Component Value Date/Time   CHOL 114 07/21/2018 07:47 AM   TRIG 130 07/21/2018 07:47 AM   HDL 38 (L) 07/21/2018 07:47 AM   CHOLHDL 3.0 07/21/2018 07:47 AM   CHOLHDL 3.5 05/30/2016 02:01 AM   LDLCALC 50 07/21/2018 07:47 AM   LDLDIRECT 83.0 10/28/2014 04:22 PM    Wt Readings from Last 3 Encounters:  11/07/18 218 lb (98.9 kg)  09/04/18 218 lb 1.9 oz (98.9 kg)  01/31/18 215 lb (97.5 kg)     Objective:    Vital Signs:  BP (!) 157/80 (BP Location: Left Arm, Patient Position: Sitting, Cuff Size: Normal)   Pulse (!) 57   Ht  (1.88 m)   Wt 218 lb (98.9 kg)   BMI 27.99 kg/m    No further exam available - telephone call ,  Pt does not have a smart phone  ASSESSMENT & PLAN:    1. Chest palpitations/chest tightness: The patient had episodes of chest palpitations and some chest burning last night.  He called EMS.  They arrived and took an be and it showed no abnormalities.  His heart rate was 62 and regular during that time.  His blood pressure was noted to be elevated.  He admits to eating a bit more salt recently.  He is already on max dose amlodipine and his baseline heart rate is in the low 60s.  We  will add hydrochlorothiazide 25 mg a day and potassium chloride 10 mEq a day.  These will be sent to [facility] in [place].  We will schedule him for a basic metabolic profile in the next 3 to 4 weeks. He has a known history of paroxysmal atrial fibrillation so we will be watching closely for any recurrent episodes of A. fib.  He has a known history of coronary artery disease and this may have been an episode of angina associated with a high blood pressure.  Continue medical therapy for now.  Will consider doing a stress test at a later time after the COVID-19 issues have quieted down.  COVID-19 Education: The signs and symptoms of COVID-19 were discussed with the patient and how to seek care for testing (follow up with PCP or arrange E-visit).  The importance of social distancing was discussed today.  Time:   Today, I have spent  17  minutes with the patient with telehealth technology discussing the above problems.     Medication Adjustments/Labs and Tests Ordered: Current medicines are reviewed at length with the patient today.  Concerns regarding medicines are outlined above.   Tests Ordered: No orders of the defined types were placed in this encounter.   Medication Changes: No orders of the defined types were placed in this encounter.   Disposition:  Follow up in 1 week(s)  Signed, Kristeen Miss, MD  11/07/2018 11:19 AM    [facility]

## 2018-11-15 ENCOUNTER — Telehealth: Payer: Self-pay

## 2018-11-15 DIAGNOSIS — I2510 Atherosclerotic heart disease of native coronary artery without angina pectoris: Secondary | ICD-10-CM

## 2018-11-15 NOTE — Telephone Encounter (Signed)
The patient has a history of coronary artery disease and is status post stenting in the past.  I recently saw him via telemedicine visit.  He was having symptoms that were consistent with possibly recurrent coronary artery disease versus congestive heart failure.  His blood pressure was elevated and we started him on hydrochlorothiazide and potassium.  His heart rate is well controlled.  Before we clear him for surgery, we need to see if his symptoms have resolved with improvement of his blood pressure.  I would also like to do a Lexiscan Myoview study prior to any surgical procedure.  It appears that we are opening up the stress testing scheduled on May 11.  I would suggest that we try to work him into a [employer] study sometime soon.   he has a history of paroxysmal atrial fibrillation and is on Eliquis.  We should be able to hold his Eliquis for 3 days prior to his back surgery once we have cleared him from a cardiology standpoint.

## 2018-11-15 NOTE — Telephone Encounter (Signed)
Dr. Elease Hashimoto Preop clearance was received for this patient for back surgery.  You just saw him last week and mentioned a stress test in your note.  Can you please comment if he should have the stress test prior to surgery?  Or if he can proceed with surgery.  I will reach out to the pharmacy for recommendations on anticoagulation.

## 2018-11-15 NOTE — Telephone Encounter (Signed)
    [facility] Pre-operative Risk Assessment    Request for surgical clearance:  1. What type of surgery is being performed? L2-S1 laminectomy fusion, L4 TLIF, hardware removal   2. When is this surgery scheduled? May 2020   3. What type of clearance is required (medical clearance vs. Pharmacy clearance to hold med vs. Both)? Pharmacy  4. Are there any medications that need to be held prior to surgery and how long?Eliquis X 3 days   5. Practice name and name of physician performing surgery? [facility]     6. What is your office phone number [phone]    7.   What is your office fax number [phone]  8.   Anesthesia type (None, local, MAC, general) ? None listed   Jacinta Shoe 11/15/2018, 4:50 PM  _________________________________________________________________   (provider comments below)

## 2018-11-16 NOTE — Telephone Encounter (Signed)
Spoke with patient to find out if he can come into the office for [employer] on Wed. May 6. He verbalized agreement and is aware that someone from our office will call him to schedule the time and give him instructions.

## 2018-11-16 NOTE — Telephone Encounter (Signed)
   Primary Cardiologist: Kristeen Miss, MD  Chart reviewed as part of pre-operative protocol coverage.  Joseph [name] was last seen on 11/07/18 by Dr. Elease Hashimoto. Unfortunately, during that visit the patient discussed symptoms that may be consistent with recurrent CAD. Prior to back surgery, the patient needs to be evaluated with at least a nuclear medicine stress test. We will complete this when OP testing is available, hopefully in May. I called and discussed this with the patient and he states he is not having back surgery soon.   I will fax this letter to the requesting office.    Joseph Rutherford Rindy Kollman, PA 11/16/2018, 10:40 AM

## 2018-11-16 NOTE — Telephone Encounter (Signed)
Hi Michelle, We are trying to get this patient cleared for surgery. He recently saw Dr. Elease Hashimoto. Dr. Elease Hashimoto wants him to have an outpatient lexiscan myoview prior to clearance. I placed the order. Dr. Elease Hashimoto suggested this might be able to be worked in on May 11 when OP stress tests start back up. Can you help facilitate scheduling this? If I need to talk to someone, please let me know who, and I'll gladly explain the situation. Many thanks Angie

## 2018-11-16 NOTE — Telephone Encounter (Addendum)
Pt takes Eliquis for afib with CHADS2VASc score of 7 (age x2, HTN, DM, CAD, CVA). SCr 1.31, CrCl 82mL/min. Per Dr Elease Hashimoto, ok to hold Eliquis 3 days prior to spinal procedure once cleared from a cardiac perspective.

## 2018-11-16 NOTE — Addendum Note (Signed)
Addended by: Duane Boston on: 11/16/2018 10:40 AM   Modules accepted: Orders

## 2018-11-17 ENCOUNTER — Telehealth: Payer: Self-pay | Admitting: Cardiovascular Disease

## 2018-11-17 NOTE — Telephone Encounter (Signed)
Pt aware dr/rn not in clinic today and to expect an answer next week, pt accepting of plan.

## 2018-11-17 NOTE — Telephone Encounter (Signed)
New Message   Patient is calling in reference to his upcoming stress test. He wants to know if he should cancel for now since he may not have his procedure until August. Please call.

## 2018-11-20 NOTE — Telephone Encounter (Signed)
Spoke with patient who states he does not know when his surgery will take place. He asks if stress test results will be good if surgery does not occur until the fall. I advised that once he has the stress test completed, that the results will be sufficient for cardiac clearance for approximately one year unless he has new cardiac symptoms. Patient verbalized understanding and states he will keep his appointment for lexiscan myoview on Wed. 5/6. He thanked me for the call.

## 2018-11-22 ENCOUNTER — Ambulatory Visit (HOSPITAL_COMMUNITY): Payer: Medicare Other | Attending: Cardiology

## 2018-11-22 ENCOUNTER — Other Ambulatory Visit: Payer: Self-pay

## 2018-11-22 DIAGNOSIS — I2510 Atherosclerotic heart disease of native coronary artery without angina pectoris: Secondary | ICD-10-CM | POA: Insufficient documentation

## 2018-11-22 MED ORDER — TECHNETIUM TC 99M TETROFOSMIN IV KIT
31.7000 | PACK | Freq: Once | INTRAVENOUS | Status: AC | PRN
  Administered 2018-11-22: 31.7 via INTRAVENOUS
  Filled 2018-11-22: qty 32

## 2018-11-22 MED ORDER — TECHNETIUM TC 99M TETROFOSMIN IV KIT
9.3000 | PACK | Freq: Once | INTRAVENOUS | Status: AC | PRN
  Administered 2018-11-22: 9.3 via INTRAVENOUS
  Filled 2018-11-22: qty 10

## 2018-11-22 MED ORDER — REGADENOSON 0.4 MG/5ML IV SOLN
0.4000 mg | Freq: Once | INTRAVENOUS | Status: AC
  Administered 2018-11-22: 0.4 mg via INTRAVENOUS

## 2018-11-23 LAB — MYOCARDIAL PERFUSION IMAGING
LV dias vol: 98 mL (ref 62–150)
LV sys vol: 39 mL
Peak HR: 81 {beats}/min
Rest HR: 65 {beats}/min
SDS: 2
SRS: 3
SSS: 6
TID: 1.01

## 2018-11-27 ENCOUNTER — Telehealth: Payer: Self-pay | Admitting: Physician Assistant

## 2018-11-27 NOTE — Telephone Encounter (Signed)
   Primary Cardiologist: Kristeen Miss, MD  Chart reviewed as part of pre-operative protocol coverage. Patient was contacted 11/27/2018 in reference to pre-operative risk assessment for pending surgery as outlined below.  Joseph [name] was last seen on 11/07/18 by Dr. Elease [name]. Patient reported cardiac symptoms at that visit prompting evaluation with nulcear stress test. Stress test performed 11/22/18 was interpreted as low risk. Per Dr. Elease [name], he is at low risk for cardiac complications during the perioperative period.   Per our pharmacy staff:  Pt takes Eliquis for afib with CHADS2VASc score of 7 (age x2, HTN, DM, CAD, CVA). SCr 1.31, CrCl 37mL/min. Per Dr Joseph [name], ok to hold Eliquis 3 days prior to spinal procedure once cleared from a cardiac perspective.  Therefore, based on ACC/AHA guidelines, the patient would be at acceptable risk for the planned procedure without further cardiovascular testing.   The patient states that he is postponing surgery at this time.  This clearance is good for 2 months.  I will route this recommendation to the requesting party via Epic fax function and remove from pre-op pool.  Please call with questions.  Joseph Rutherford Franck Vinal, PA 11/27/2018, 8:01 AM

## 2019-02-01 NOTE — Telephone Encounter (Addendum)
Lipids checked at [facility] on 12/19/18: TG 164, TG 130, HDL 34, LDL 106, nonHDL 130. Spoke with pt who is willing to try Nexletol. Prior auth submitted - pt has [employer] insurance now: ID [id number], BIN [place], PC[place], [place]

## 2019-02-02 MED ORDER — NEXLETOL 180 MG PO TABS: 1.0000 | ORAL_TABLET | Freq: Every day | ORAL | 11 refills | Status: DC

## 2019-02-02 NOTE — Addendum Note (Signed)
Addended by: SUPPLE, MEGAN E on: 02/02/2019 03:36 PM   Modules accepted: Orders

## 2019-02-02 NOTE — Telephone Encounter (Addendum)
Prior authorization for Nexletol has been approved until further notice. Rx sent to pharmacy to determine copay. Copay cost prohibitive at $125/month. Applied for [employer] and pt was approved for $2500 of copay assistance through 01/02/20. Copay now $0, pt is aware. Scheduled f/u labs in 3 months.

## 2019-02-02 NOTE — Addendum Note (Signed)
Addended by: Juno Alers E on: 02/02/2019 08:54 AM   Modules accepted: Orders

## 2019-02-23 ENCOUNTER — Telehealth: Payer: Self-pay | Admitting: Pharmacist

## 2019-02-23 NOTE — Telephone Encounter (Signed)
Pt stated he noticed stomach burning, bad gas, growling stomach, and trouble sleeping since starting Nexletol. He had been taking his Nexletol in the morning and just moved dosing to the evening but didn't notice any improvement. States that his symptoms started shortly after beginning Nexletol. GI side effects are listed at 3% incidence. Advised pt to stop his Nexletol for a week to see if symptoms improve, then resume therapy and see if symptoms return so that we can tell more clearly if his sympoms are being caused by Nexletol. He will call with an update in a few weeks.

## 2019-03-20 DIAGNOSIS — E040 Nontoxic diffuse goiter: Secondary | ICD-10-CM | POA: Insufficient documentation

## 2019-03-21 NOTE — Telephone Encounter (Signed)
Pt called clinic stating he is still not tolerating Nexletol well. Reports aches and pains, dizziness when he gets up, and feeling weak. He stopped taking Nexletol and felt back to normal in 3 days.  Pt is previously intolerant to [name], atorvastatin 10mg  daily, lovastatin 20mg  daily, pravastatin 20mg  and 40mg  daily, rosuvastatin 5mg  3 days per week, and Zetia.  Discussed referral to research for [name] trial studying inclisiran injections. Pt may be interested but wishes to have his cholesterol rechecked before then. He states his PCP plans to draw lab work in 2 weeks (he will have been off of Nexletol therapy for 4 weeks by that time). If LDL remains elevated, will refer pt to research.  Will route to Dr Acie Fredrickson as an Juluis Rainier and [place] since note had been added to next appt to draw labs (I have canceled lipid/LFT lab that I had previously scheduled)

## 2019-03-21 NOTE — Addendum Note (Signed)
Addended by: SUPPLE, MEGAN E on: 03/21/2019 02:06 PM   Modules accepted: Orders

## 2019-04-22 NOTE — Progress Notes (Signed)
Cardiology Office Note:    Date:  04/23/2019   ID:  Joseph [name], Joseph [name] October 11, 1940, MRN [id number]  PCP:  Dema Severin, NP  Cardiologist:  Kristeen Miss, MD  Electrophysiologist:  None   Referring MD: Dema Severin, NP   Chief Complaint  Patient presents with  . Atrial Fibrillation     Feb. 17, 2020    Joseph [name] is a 78 y.o. male with a hx of coronary artery disease.  He is status post anterior wall myocardial infarction in 2004 treated with a stent to the LAD with a staged PCI to the right coronary artery.  Also had an non-ST segment elevation myocardial infarction in January 2016 treated treated with a DES to the mid LAD and a DES to the mid and distal circumflex artery. He also has a history of paroxysmal atrial fibrillation, hypertension, hyperlipidemia.  He is intolerant to statins.  He has a history of diabetes mellitus and a previous stroke.  He is a previous patient of Dr. Okey Dupre .  He was last seen in our office in July, 2019 by Tereso Newcomer, PA.  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  Denies any chest pain. Rides a stationary bike - does not have any issues with that  Follows a low chol diet .   Has had a rash across his body for the past 2 years .     Might be related to the onset of the Eliquis .  He thinks the rash is worse since he started Coreg.   Oct. 5, 2020 Doing well.    No CP,  Works around the yard,  Hip pain limits his exercise.  Just got through building a work shop .   He brought brought blood work from his primary medical doctor.  Vitamin D level is 40.  Sodium is 140.  Potassium is 4.8.  BUN is 34.  Creatinine is 1.24.  Liver enzymes are normal.  Thyroid profiles are normal.  Total cholesterol is 173.  LDL is 106.  Triglycerides are 160.  HDL is 33.  Hemoglobin A1c is 7.6.  He has hyperlipidemia.  He is tried numerous statins and also has tried the PCSK9 inhibitors.  He did not tolerate any of these.  He also has tried Zetia but did  not tolerate it.  We will continue with diet and exercise.   Past Medical History:  Diagnosis Date  . Abdominal discomfort    Abdominal discomfort: EGD 2011 showed no significant abnormalities.  Possibly due to metformin.  . Allergic rhinitis   . Anticoagulated 05/30/2016  . CAD (coronary artery disease)    a. s/p MI in 2004:  stents to the LAD and staged stent RCA, EF 55%;  b.  NSTEMI (1/16):  LHC - mid LAD stent with diff dist restenosis, prox to mid Dx mod dsz jailed by stent, small OM1 99, mCFX 75, dCFX 80, mRCA stent ok, mPDA mild to mod dsz, EF 55% >> PCI:  Synergy DES to LAD; Synergy DES (x2) mid and dist CFX  . 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  s/p back surgery  . DM (diabetes mellitus) (HCC)   . History of CVA (cerebrovascular accident)    Small CVA seen by head CT (incidental) in 1/13.  Carotid dopplers in 1/13 showed minimal disease  . History of Doppler ultrasound    Arterial dopplers (3/11): no evidence for  significant PAD. ABIs 10/12: Normal.    . HLD (hyperlipidemia)   . HTN (hypertension)    had side effects with chlorthalidone  . Hx of cardiovascular stress test    Myoview (1/13): Small fixed apical septal defect with no ischemia, EF 58%.  [name] Hx of echocardiogram    Echo (3/11): EF 60%, normal wall motion, mild MR, mild left atrial enlargement, mildly dilated ascending aorta.  [name] PAF (paroxysmal atrial fibrillation) ([place]) 05/29/2016  . Spondylolisthesis    L4-S1    Past Surgical History:  Procedure Laterality Date  . COLONOSCOPY N/A 12/26/2017   Procedure: COLONOSCOPY;  Surgeon: Yetta Flock, MD;  Location: [facility] ENDOSCOPY;  Service: Gastroenterology;  Laterality: N/A;  . COLONOSCOPY W/ POLYPECTOMY  12/2017  . CORONARY ANGIOGRAM  11/13/2014   Procedure: CORONARY ANGIOGRAM;  Surgeon: Larey Dresser, MD;  Location: [facility] CATH LAB;  Service: Cardiovascular;;  . CORONARY STENT PLACEMENT  2004   x2  LAD and RCA   . LEFT HEART CATHETERIZATION WITH CORONARY ANGIOGRAM N/A 08/02/2014   Procedure: LEFT HEART CATHETERIZATION WITH CORONARY ANGIOGRAM;  Surgeon: Jettie Booze, MD;  Location: [facility] CATH LAB;  Service: Cardiovascular;  Laterality: N/A;  . PROSTATE SURGERY    . SPINAL FUSION     L4-S1    Current Medications: Current Meds  Medication Sig  . acetaminophen (TYLENOL) 650 MG CR tablet Take 1,300 mg by mouth every 8 (eight) hours as needed for pain.  [name] amLODipine (NORVASC) 10 MG tablet Take 10 mg by mouth daily.  [name] apixaban (ELIQUIS) 5 MG TABS tablet Take 1 tablet (5 mg total) by mouth 2 (two) times daily. DO NOT TAKE FOR 3 MORE DAYS. MAY RESTART TAKING IT ON 01/01/2018. (Patient taking differently: Take 5 mg by mouth 2 (two) times daily. )  . carvedilol (COREG) 3.125 MG tablet Take 1 tablet by mouth 2 (two) times daily.  . Coenzyme Q10 100 MG capsule Take 1 capsule by mouth daily.  [name] diltiazem (CARDIZEM) 30 MG tablet Take 1 tablet (30 mg total) by mouth as needed (Rapid Palpitations).  . fluticasone (FLONASE) 50 MCG/ACT nasal spray Place 2 sprays into both nostrils daily as needed for allergies or rhinitis.   [name] gabapentin (NEURONTIN) 300 MG capsule Take 300 mg by mouth at bedtime as needed (back pain).  [name] GARLIC PO Take 1 capsule by mouth 2 (two) times daily.  [name] glipiZIDE (GLUCOTROL) 10 MG tablet Take 10 mg by mouth 2 (two) times daily before a meal.    . hydrochlorothiazide (HYDRODIURIL) 25 MG tablet Take 1 tablet (25 mg total) by mouth daily.  [name] ibuprofen (ADVIL) 200 MG tablet Take 400 mg by mouth at bedtime as needed (back pain).  . Insulin Degludec 100 UNIT/ML SOLN Inject 22 Units into the skin every morning.  . isosorbide mononitrate (IMDUR) 60 MG 24 hr tablet Take 120 mg by mouth daily.  [name] lisinopril (PRINIVIL,ZESTRIL) 20 MG tablet Take 1 tablet (20 mg total) by mouth daily.  . nitroGLYCERIN (NITROSTAT) 0.4 MG SL tablet Place 0.4 mg under the tongue every 5 (five) minutes as  needed for chest pain (x 3 doses).   . Omega-3 Fatty Acids (FISH OIL) 1000 MG CAPS Take 4 capsules (4,000 mg total) by mouth daily.  . potassium chloride (K-DUR) 10 MEQ tablet Take 1 tablet (10 mEq total) by mouth daily.  [name] triamcinolone cream (KENALOG) 0.5 % Apply 1 application topically as needed for irritation.     Allergies:   Bempedoic acid,  [name] [alirocumab], Rosuvastatin, and Zetia [ezetimibe]   Social History   Socioeconomic History  . Marital status: Divorced    Spouse name: Not on file  . Number of children: 2  . Years of education: Not on file  . Highest education level: Not on file  Occupational History  . Occupation: retired  Engineer, production  . Financial resource strain: Not on file  . Food insecurity    Worry: Not on file    Inability: Not on file  . Transportation needs    Medical: Not on file    Non-medical: Not on file  Tobacco Use  . Smoking status: Never Smoker  . Smokeless tobacco: Never Used  Substance and Sexual Activity  . Alcohol use: No  . Drug use: No  . Sexual activity: Not on file  Lifestyle  . Physical activity    Days per week: Not on file    Minutes per session: Not on file  . Stress: Not on file  Relationships  . Social Musician on phone: Not on file    Gets together: Not on file    Attends religious service: Not on file    Active member of club or organization: Not on file    Attends meetings of clubs or organizations: Not on file    Relationship status: Not on file  Other Topics Concern  . Not on file  Social History Narrative  . Not on file     Family History: The patient's family history includes Heart attack in his father and mother. There is no history of Stroke.  ROS:   Please see the history of present illness.     All other systems reviewed and are negative.  EKGs/Labs/Other Studies Reviewed:    The following studies were reviewed today:   . Recent Labs: 07/21/2018: ALT 17  Recent Lipid Panel     Component Value Date/Time   CHOL 114 07/21/2018 0747   TRIG 130 07/21/2018 0747   HDL 38 (L) 07/21/2018 0747   CHOLHDL 3.0 07/21/2018 0747   CHOLHDL 3.5 05/30/2016 0201   VLDL 24 05/30/2016 0201   LDLCALC 50 07/21/2018 0747   LDLDIRECT 83.0 10/28/2014 1622    Physical Exam: Blood pressure 132/68, pulse (!) 57, height  (1.88 m), weight 217 lb (98.4 kg), SpO2 95 %.  GEN:  Well nourished, elderly gentleman, no acute distress HEENT: Normal NECK: No JVD; No carotid bruits LYMPHATICS: No lymphadenopathy CARDIAC: RRR, no significant murmurs RESPIRATORY:  Clear to auscultation without rales, wheezing or rhonchi  ABDOMEN: Soft, non-tender, non-distended MUSCULOSKELETAL:  No edema; No deformity  SKIN: Warm and dry NEUROLOGIC:  Alert and oriented x 3    ECG:   ASSESSMENT:    No diagnosis found. PLAN:    In order of problems listed above:  1. 1.  Coronary artery disease:: Patient has a history of coronary artery disease.  He has a history of hyperlipidemia but does not tolerate.  2.  Paroxysmal atrial fibrillation: *  Cont eliquis   3.  Hyperlipidemia: This is being managed by his primary medical doctor.  He is intolerant to statins, Zetia, PCSK9 inhibitors.  Continue with diet.     will see him in 1 year   Medication Adjustments/Labs and Tests Ordered: Current medicines are reviewed at length with the patient today.  Concerns regarding medicines are outlined above.  No orders of the defined types were placed in this encounter.  No orders of the defined  types were placed in this encounter.   Patient Instructions  Medication Instructions:  Your physician recommends that you continue on your current medications as directed. Please refer to the Current Medication list given to you today.  If you need a refill on your cardiac medications before your next appointment, please call your pharmacy.    Lab work: None Ordered   Testing/Procedures: None Ordered    Follow-Up: At [facility], you and your health needs are our priority.  As part of our continuing mission to provide you with exceptional heart care, we have created designated Provider Care Teams.  These Care Teams include your primary Cardiologist (physician) and Advanced Practice Providers (APPs -  Physician Assistants and Nurse Practitioners) who all work together to provide you with the care you need, when you need it. You will need a follow up appointment in:  1 years.  Please call our office 2 months in advance to schedule this appointment.  You may see Kristeen MissPhilip Sammie Schermerhorn, MD or one of the following Advanced Practice Providers on your designated Care Team: Tereso NewcomerScott Weaver, PA-C Vin KeyesBhagat, [place] JerseyPA-C . Berton BonJanine Hammond, NP      Signed, Kristeen MissPhilip England Greb, MD  04/23/2019 5:55 PM    [facility]

## 2019-04-23 ENCOUNTER — Ambulatory Visit (INDEPENDENT_AMBULATORY_CARE_PROVIDER_SITE_OTHER): Payer: Medicare Other | Admitting: Cardiovascular Disease

## 2019-04-23 ENCOUNTER — Other Ambulatory Visit: Payer: Medicare Other

## 2019-04-23 ENCOUNTER — Encounter: Payer: Self-pay | Admitting: Cardiovascular Disease

## 2019-04-23 ENCOUNTER — Other Ambulatory Visit: Payer: Self-pay

## 2019-04-23 ENCOUNTER — Encounter (INDEPENDENT_AMBULATORY_CARE_PROVIDER_SITE_OTHER): Payer: Self-pay

## 2019-04-23 VITALS — BP 132/68 | HR 57 | Ht 74.0 in | Wt 217.0 lb

## 2019-04-23 DIAGNOSIS — Z9861 Coronary angioplasty status: Secondary | ICD-10-CM | POA: Diagnosis not present

## 2019-04-23 DIAGNOSIS — I200 Unstable angina: Secondary | ICD-10-CM | POA: Diagnosis not present

## 2019-04-23 DIAGNOSIS — I480 Paroxysmal atrial fibrillation: Secondary | ICD-10-CM | POA: Diagnosis not present

## 2019-04-23 DIAGNOSIS — I2510 Atherosclerotic heart disease of native coronary artery without angina pectoris: Secondary | ICD-10-CM | POA: Diagnosis not present

## 2019-04-23 NOTE — Patient Instructions (Signed)

## 2019-04-24 ENCOUNTER — Telehealth: Payer: Self-pay | Admitting: Pharmacist

## 2019-04-24 NOTE — Telephone Encounter (Signed)
Called pt to discuss referral to research for ORION trial. He is previously intolerant to Nexletol, [name], atorvastatin 10mg  daily, lovastatin 20mg  daily, pravastatin 20mg  and 40mg  daily, rosuvastatin 5mg  3 days per week, and Zetia.  Lipids checked at PCP on 12/19/18: TC 164, TG 130, HDL 34, LDL 106  He is interested in learning more about the trial, will forward to our research nurses to reach out to pt.

## 2019-04-27 ENCOUNTER — Telehealth: Payer: Self-pay | Admitting: Cardiovascular Disease

## 2019-04-27 NOTE — Telephone Encounter (Signed)
New Message:    Joseph [name] said pt said sometthing about getting his lab work with his Engineer, production. She said pt just had lab work there on 9-24- and 9-25. Would you like a copy of that or do you want to send an order so he can he get lab work there? If you want to send an order, please fax to [phone].

## 2019-04-27 NOTE — Telephone Encounter (Signed)
I spoke to the patient who said that he gave Dr Acie Fredrickson his recent lab work on 10/5 at his OV and Dr Acie Fredrickson had a copy made.  I do not see in his box, nor scanned in.  Please advise, thank you

## 2019-04-30 NOTE — Telephone Encounter (Signed)
Patient's lab results are scanned into his chart

## 2019-05-03 ENCOUNTER — Encounter: Payer: Medicare Other | Admitting: *Deleted

## 2019-05-07 ENCOUNTER — Encounter: Payer: Self-pay | Admitting: *Deleted

## 2019-05-07 ENCOUNTER — Other Ambulatory Visit: Payer: Medicare Other

## 2019-05-07 DIAGNOSIS — Z006 Encounter for examination for normal comparison and control in clinical research program: Secondary | ICD-10-CM

## 2019-05-07 NOTE — Research (Signed)
Late entry:  Patient to research clinic for screening in the [place] research study.  Met inclusion/exclusion.  [place] Informed Consent   Subject Name: Joseph [name]  Subject met inclusion and exclusion criteria.  The informed consent form, study requirements and expectations were reviewed with the subject and questions and concerns were addressed prior to the signing of the consent form.  The subject verbalized understanding of the trial requirements.  The subject agreed to participate in the [place]  trial and signed the informed consent at  on 05/03/2019.  The informed consent was obtained prior to performance of any protocol-specific procedures for the subject.  A copy of the signed informed consent was given to the subject and a copy was placed in the subject's medical record.   [name].   Subject given injection and tolerated well.  Next appointment scheduled.

## 2019-05-17 ENCOUNTER — Telehealth: Payer: Self-pay | Admitting: Pharmacist

## 2019-05-17 ENCOUNTER — Encounter (HOSPITAL_COMMUNITY): Payer: Self-pay

## 2019-05-17 ENCOUNTER — Other Ambulatory Visit: Payer: Self-pay

## 2019-05-17 ENCOUNTER — Inpatient Hospital Stay (HOSPITAL_COMMUNITY)
Admission: AD | Admit: 2019-05-17 | Discharge: 2019-05-19 | DRG: 247 | Disposition: A | Discharge status: Home / Self Care | Payer: Medicare Other | Source: Other Acute Inpatient Hospital | Attending: Cardiovascular Disease | Admitting: Cardiovascular Disease

## 2019-05-17 DIAGNOSIS — Z888 Allergy status to other drugs, medicaments and biological substances status: Secondary | ICD-10-CM

## 2019-05-17 DIAGNOSIS — E785 Hyperlipidemia, unspecified: Secondary | ICD-10-CM | POA: Diagnosis not present

## 2019-05-17 DIAGNOSIS — R079 Chest pain, unspecified: Secondary | ICD-10-CM | POA: Diagnosis not present

## 2019-05-17 DIAGNOSIS — Z79899 Other long term (current) drug therapy: Secondary | ICD-10-CM

## 2019-05-17 DIAGNOSIS — Z7901 Long term (current) use of anticoagulants: Secondary | ICD-10-CM | POA: Diagnosis not present

## 2019-05-17 DIAGNOSIS — I480 Paroxysmal atrial fibrillation: Secondary | ICD-10-CM | POA: Diagnosis present

## 2019-05-17 DIAGNOSIS — I25118 Atherosclerotic heart disease of native coronary artery with other forms of angina pectoris: Secondary | ICD-10-CM | POA: Diagnosis present

## 2019-05-17 DIAGNOSIS — Z8673 Personal history of transient ischemic attack (TIA), and cerebral infarction without residual deficits: Secondary | ICD-10-CM | POA: Diagnosis not present

## 2019-05-17 DIAGNOSIS — I252 Old myocardial infarction: Secondary | ICD-10-CM | POA: Diagnosis not present

## 2019-05-17 DIAGNOSIS — E1159 Type 2 diabetes mellitus with other circulatory complications: Secondary | ICD-10-CM | POA: Diagnosis present

## 2019-05-17 DIAGNOSIS — R0789 Other chest pain: Secondary | ICD-10-CM | POA: Diagnosis present

## 2019-05-17 DIAGNOSIS — I2510 Atherosclerotic heart disease of native coronary artery without angina pectoris: Secondary | ICD-10-CM

## 2019-05-17 DIAGNOSIS — I25110 Atherosclerotic heart disease of native coronary artery with unstable angina pectoris: Principal | ICD-10-CM | POA: Diagnosis present

## 2019-05-17 DIAGNOSIS — E7800 Pure hypercholesterolemia, unspecified: Secondary | ICD-10-CM | POA: Diagnosis present

## 2019-05-17 DIAGNOSIS — Z794 Long term (current) use of insulin: Secondary | ICD-10-CM | POA: Diagnosis not present

## 2019-05-17 DIAGNOSIS — T82855A Stenosis of coronary artery stent, initial encounter: Secondary | ICD-10-CM | POA: Diagnosis not present

## 2019-05-17 DIAGNOSIS — Z20828 Contact with and (suspected) exposure to other viral communicable diseases: Secondary | ICD-10-CM | POA: Diagnosis present

## 2019-05-17 DIAGNOSIS — Z8249 Family history of ischemic heart disease and other diseases of the circulatory system: Secondary | ICD-10-CM

## 2019-05-17 DIAGNOSIS — Y831 Surgical operation with implant of artificial internal device as the cause of abnormal reaction of the patient, or of later complication, without mention of misadventure at the time of the procedure: Secondary | ICD-10-CM | POA: Diagnosis not present

## 2019-05-17 DIAGNOSIS — E1151 Type 2 diabetes mellitus with diabetic peripheral angiopathy without gangrene: Secondary | ICD-10-CM | POA: Diagnosis not present

## 2019-05-17 DIAGNOSIS — I119 Hypertensive heart disease without heart failure: Secondary | ICD-10-CM | POA: Diagnosis not present

## 2019-05-17 DIAGNOSIS — Z9861 Coronary angioplasty status: Secondary | ICD-10-CM

## 2019-05-17 DIAGNOSIS — I739 Peripheral vascular disease, unspecified: Secondary | ICD-10-CM | POA: Diagnosis present

## 2019-05-17 DIAGNOSIS — I200 Unstable angina: Secondary | ICD-10-CM | POA: Diagnosis present

## 2019-05-17 LAB — GLUCOSE, CAPILLARY: Glucose-Capillary: 83 mg/dL (ref 70–99)

## 2019-05-17 MED ORDER — INSULIN ASPART 100 UNIT/ML ~~LOC~~ SOLN
0.0000 [IU] | Freq: Three times a day (TID) | SUBCUTANEOUS | Status: DC
  Administered 2019-05-19: 3 [IU] via SUBCUTANEOUS
  Administered 2019-05-19: 2 [IU] via SUBCUTANEOUS

## 2019-05-17 MED ORDER — COENZYME Q10 100 MG PO CAPS: 1.0000 | ORAL_CAPSULE | Freq: Every day | ORAL | Status: DC

## 2019-05-17 MED ORDER — INSULIN GLARGINE 100 UNIT/ML ~~LOC~~ SOLN
22.0000 [IU] | Freq: Every morning | SUBCUTANEOUS | Status: DC
  Administered 2019-05-18 – 2019-05-19 (×2): 22 [IU] via SUBCUTANEOUS
  Filled 2019-05-17 (×2): qty 0.22

## 2019-05-17 MED ORDER — ASPIRIN 81 MG PO CHEW: 324.0000 mg | CHEWABLE_TABLET | ORAL | Status: DC

## 2019-05-17 MED ORDER — GABAPENTIN 300 MG PO CAPS: 300.0000 mg | ORAL_CAPSULE | Freq: Every evening | ORAL | Status: DC | PRN

## 2019-05-17 MED ORDER — ASPIRIN 300 MG RE SUPP: 300.0000 mg | RECTAL | Status: DC

## 2019-05-17 MED ORDER — NITROGLYCERIN 0.4 MG SL SUBL: 0.4000 mg | SUBLINGUAL_TABLET | SUBLINGUAL | Status: DC | PRN

## 2019-05-17 MED ORDER — ASPIRIN EC 81 MG PO TBEC
81.0000 mg | DELAYED_RELEASE_TABLET | Freq: Every day | ORAL | Status: DC
  Administered 2019-05-18 – 2019-05-19 (×2): 81 mg via ORAL
  Filled 2019-05-17 (×2): qty 1

## 2019-05-17 MED ORDER — FLUTICASONE PROPIONATE 50 MCG/ACT NA SUSP
2.0000 | Freq: Every day | NASAL | Status: DC | PRN
  Filled 2019-05-17: qty 16

## 2019-05-17 MED ORDER — AMLODIPINE BESYLATE 10 MG PO TABS
10.0000 mg | ORAL_TABLET | Freq: Every day | ORAL | Status: DC
  Administered 2019-05-18 – 2019-05-19 (×2): 10 mg via ORAL
  Filled 2019-05-17 (×2): qty 1

## 2019-05-17 MED ORDER — DILTIAZEM HCL 60 MG PO TABS: 30.0000 mg | ORAL_TABLET | ORAL | Status: DC | PRN

## 2019-05-17 MED ORDER — POTASSIUM CHLORIDE CRYS ER 10 MEQ PO TBCR
10.0000 meq | EXTENDED_RELEASE_TABLET | Freq: Every day | ORAL | Status: DC
  Administered 2019-05-18 – 2019-05-19 (×2): 10 meq via ORAL
  Filled 2019-05-17 (×5): qty 1

## 2019-05-17 MED ORDER — LISINOPRIL 20 MG PO TABS
20.0000 mg | ORAL_TABLET | Freq: Every day | ORAL | Status: DC
  Administered 2019-05-18 – 2019-05-19 (×2): 20 mg via ORAL
  Filled 2019-05-17 (×2): qty 1

## 2019-05-17 MED ORDER — GLIPIZIDE 10 MG PO TABS
10.0000 mg | ORAL_TABLET | Freq: Two times a day (BID) | ORAL | Status: DC
  Administered 2019-05-19: 10 mg via ORAL
  Filled 2019-05-17: qty 1

## 2019-05-17 MED ORDER — ACETAMINOPHEN 325 MG PO TABS: 650.0000 mg | ORAL_TABLET | ORAL | Status: DC | PRN

## 2019-05-17 MED ORDER — ONDANSETRON HCL 4 MG/2ML IJ SOLN: 4.0000 mg | Freq: Four times a day (QID) | INTRAMUSCULAR | Status: DC | PRN

## 2019-05-17 MED ORDER — ISOSORBIDE MONONITRATE ER 60 MG PO TB24
120.0000 mg | ORAL_TABLET | Freq: Every day | ORAL | Status: DC
  Administered 2019-05-18 – 2019-05-19 (×2): 120 mg via ORAL
  Filled 2019-05-17 (×2): qty 2

## 2019-05-17 MED ORDER — CARVEDILOL 3.125 MG PO TABS
3.1250 mg | ORAL_TABLET | Freq: Two times a day (BID) | ORAL | Status: DC
  Administered 2019-05-18 – 2019-05-19 (×4): 3.125 mg via ORAL
  Filled 2019-05-17 (×4): qty 1

## 2019-05-17 MED ORDER — HYDROCHLOROTHIAZIDE 25 MG PO TABS
25.0000 mg | ORAL_TABLET | Freq: Every day | ORAL | Status: DC
  Administered 2019-05-18 – 2019-05-19 (×2): 25 mg via ORAL
  Filled 2019-05-17 (×3): qty 1

## 2019-05-17 MED ORDER — INSULIN ASPART 100 UNIT/ML ~~LOC~~ SOLN: 0.0000 [IU] | Freq: Every day | SUBCUTANEOUS | Status: DC

## 2019-05-17 NOTE — H&P (Signed)
History and Physical   Patient ID: Joseph NissenDonald W Traister, MRN: [id number], DOB: [birth year]   Date of Encounter: 05/17/2019, 10:00 PM  Primary Care Provider: Dema SeverinYork, Regina F, NP Cardiologist: Nahser Electrophysiologist:  NA  Chief Complaint:  CP  History of Present Illness: Joseph [name] is a 78 y.o. male w/ h/o CAD (s/p anterior MI in 2004 s/p PCI to LAD, with staged PCI to RCA; subsequent NSTEMI in Jan 2016 s/p DES to mLAD and DES to mid-distal LCx), paroxysmal afib, HTN, dyslipidemia, DM2, h/o CVA presents as transfer from [facility] after presenting there w/ escalating chest discomfort. This CP started  About 4-5 days ago. His sx are primarily occurring at rest; he describes a sharp pain with pressure in the center and L side of his chest, at times radiating up into the L side of his neck, that lasts several minutes before resolving. There is no associated SOB, nausea, diaphoresis, palpitations or any other significant associated sx. He has adjusted his imdur tablets to taking both in the AM instead of BID, and this has improved his sx to some extent. He is worried however that these sx are similar to his prior angina.   Past Medical History:  Diagnosis Date  . Abdominal discomfort    Abdominal discomfort: EGD 2011 showed no significant abnormalities.  Possibly due to metformin.  . Allergic rhinitis   . Anticoagulated 05/30/2016  . CAD (coronary artery disease)    a. s/p MI in 2004:  stents to the LAD and staged stent RCA, EF 55%;  b.  NSTEMI (1/16):  LHC - mid LAD stent with diff dist restenosis, prox to mid Dx mod dsz jailed by stent, small OM1 99, mCFX 75, dCFX 80, mRCA stent ok, mPDA mild to mod dsz, EF 55% >> PCI:  Synergy DES to LAD; Synergy DES (x2) mid and dist CFX  . 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  s/p back surgery  . DM (diabetes mellitus) (HCC)   . History of CVA (cerebrovascular accident)    Small CVA  seen by head CT (incidental) in 1/13.  Carotid dopplers in 1/13 showed minimal disease  . History of Doppler ultrasound    Arterial dopplers (3/11): no evidence for significant PAD. ABIs 10/12: Normal.    . HLD (hyperlipidemia)   . HTN (hypertension)    had side effects with chlorthalidone  . Hx of cardiovascular stress test    Myoview (1/13): Small fixed apical septal defect with no ischemia, EF 58%.  [name]. Hx of echocardiogram    Echo (3/11): EF 60%, normal wall motion, mild MR, mild left atrial enlargement, mildly dilated ascending aorta.  [name]. PAF (paroxysmal atrial fibrillation) (HCC) 05/29/2016  . Spondylolisthesis    L4-S1    Past Surgical History:  Procedure Laterality Date  . COLONOSCOPY N/A 12/26/2017   Procedure: COLONOSCOPY;  Surgeon: Benancio DeedsArmbruster, Steven P, MD;  [facility];  Service: Gastroenterology;  Laterality: N/A;  . COLONOSCOPY W/ POLYPECTOMY  12/2017  . CORONARY ANGIOGRAM  11/13/2014   Procedure: CORONARY ANGIOGRAM;  Surgeon: Laurey Moralealton S McLean, MD;  [facility];  Service: Cardiovascular;;  . CORONARY STENT PLACEMENT  2004   x2 LAD and RCA   . LEFT HEART CATHETERIZATION WITH CORONARY ANGIOGRAM N/A 08/02/2014   Procedure: LEFT HEART CATHETERIZATION WITH CORONARY ANGIOGRAM;  Surgeon: Corky CraftsJayadeep S Varanasi, MD;  [facility];  Service: Cardiovascular;  Laterality: N/A;  . PROSTATE SURGERY    .  SPINAL FUSION     L4-S1     Prior to Admission medications   Medication Sig Start Date End Date Taking? Authorizing Provider  acetaminophen (TYLENOL) 650 MG CR tablet Take 1,300 mg by mouth every 8 (eight) hours as needed for pain.    [provider]  amLODipine (NORVASC) 10 MG tablet Take 10 mg by mouth daily. 11/28/17   [provider]  apixaban (ELIQUIS) 5 MG TABS tablet Take 1 tablet (5 mg total) by mouth 2 (two) times daily. DO NOT TAKE FOR 3 MORE DAYS. MAY RESTART TAKING IT ON 01/01/2018. Patient taking differently: Take 5 mg by mouth 2  (two) times daily.  12/28/17   Hongalgi, Maximino Greenland, MD  carvedilol (COREG) 3.125 MG tablet Take 1 tablet by mouth 2 (two) times daily. 08/09/18   [provider]  Coenzyme Q10 100 MG capsule Take 1 capsule by mouth daily.    [provider]  diltiazem (CARDIZEM) 30 MG tablet Take 1 tablet (30 mg total) by mouth as needed (Rapid Palpitations). 01/31/18   Tereso Newcomer T, PA-C  fluticasone (FLONASE) 50 MCG/ACT nasal spray Place 2 sprays into both nostrils daily as needed for allergies or rhinitis.  03/31/15   [provider]  gabapentin (NEURONTIN) 300 MG capsule Take 300 mg by mouth at bedtime as needed (back pain).    [provider]  GARLIC PO Take 1 capsule by mouth 2 (two) times daily.    [provider]  glipiZIDE (GLUCOTROL) 10 MG tablet Take 10 mg by mouth 2 (two) times daily before a meal.      [provider]  hydrochlorothiazide (HYDRODIURIL) 25 MG tablet Take 1 tablet (25 mg total) by mouth daily. 11/07/18   Nahser, Deloris Ping, MD  ibuprofen (ADVIL) 200 MG tablet Take 400 mg by mouth at bedtime as needed (back pain).    [provider]  Insulin Degludec 100 UNIT/ML SOLN Inject 22 Units into the skin every morning.    [provider]  isosorbide mononitrate (IMDUR) 60 MG 24 hr tablet Take 120 mg by mouth daily.    [provider]  lisinopril (PRINIVIL,ZESTRIL) 20 MG tablet Take 1 tablet (20 mg total) by mouth daily. 10/31/17   Tereso Newcomer T, PA-C  nitroGLYCERIN (NITROSTAT) 0.4 MG SL tablet Place 0.4 mg under the tongue every 5 (five) minutes as needed for chest pain (x 3 doses).     [provider]  Omega-3 Fatty Acids (FISH OIL) 1000 MG CAPS Take 4 capsules (4,000 mg total) by mouth daily. 06/03/16   Bhagat, Sharrell Ku, PA  potassium chloride (K-DUR) 10 MEQ tablet Take 1 tablet (10 mEq total) by mouth daily. 11/07/18   Nahser, Deloris Ping, MD  triamcinolone cream (KENALOG) 0.5 % Apply 1 application topically as  needed for irritation. 08/24/17   [provider]     Allergies: Allergies  Allergen Reactions  . Bempedoic Acid     Myalgias, GI upset  . [name] [Alirocumab]     Muscle weakness and rash  . Rosuvastatin Other (See Comments)    Low energy/leg and hip pain  . Zetia [Ezetimibe]     myalgias    Social History:  The patient  reports that he has never smoked. He has never used smokeless tobacco. He reports that he does not drink alcohol or use drugs.   Family History:  The patient's family history includes Heart attack in his father and mother.   ROS:  Please see the  history of present illness.     All other systems reviewed and negative.   Vital Signs: Blood pressure (!) 160/81, pulse 62, temperature 97.9 F (36.6 C), temperature source Oral, resp. rate 20, height 6\' 2"  (1.88 m), weight 98.1 kg, SpO2 99 %.  PHYSICAL EXAM: General:  Well nourished, well developed, in no acute distress HEENT: normal Lymph: no adenopathy Neck: no JVD Endocrine:  No thryomegaly Vascular: No carotid bruits; DP pulses 2+ bilaterally   Cardiac:  normal S1, S2; RRR; no murmur  Lungs:  clear to auscultation bilaterally, no wheezing, rhonchi or rales  Abd: soft, nontender, no hepatomegaly  Ext: no edema Musculoskeletal:  No deformities, BUE and BLE strength normal and equal Skin: warm and dry  Neuro:  CNs 2-12 intact, no focal abnormalities noted Psych:  Normal affect   EKG:  OSH EKG tracing not sent w/ pt; report indicates EKG showed SB with HR 59, 1st deg AVB, old septal infarct  Labs:   Lab Results  Component Value Date   WBC 7.5 12/28/2017   HGB 13.0 12/28/2017   HCT 37.7 (L) 12/28/2017   MCV 92.0 12/28/2017   PLT 182 12/28/2017   No results for input(s): NA, K, CL, CO2, BUN, CREATININE, CALCIUM, PROT, BILITOT, ALKPHOS, ALT, AST, GLUCOSE in the last 168 hours.  Invalid input(s): LABALBU No results for input(s): CKTOTAL, CKMB, TROPONINI in the last 72 hours. Troponin (Point of  Care Test) No results for input(s): TROPIPOC in the last 72 hours.  Lab Results  Component Value Date   CHOL 114 07/21/2018   HDL 38 (L) 07/21/2018   LDLCALC 50 07/21/2018   TRIG 130 07/21/2018   No results found for: DDIMER   OSH labs K 4.7, Cr 1.1, trop I <0.01  Radiology/Studies:  No results found.   CV STUDIES nuc stress test 11-23-18  Nuclear stress EF: 60%.  There was no ST segment deviation noted during stress.  Findings consistent with prior myocardial infarction.  This is a low risk study.  The left ventricular ejection fraction is normal (55-65%).  Small apical septal infarct no ischemia EF 60%  TTE 09-30-17 Left ventricle: The cavity size was normal. There was mild   concentric hypertrophy. Systolic function was normal. The   estimated ejection fraction was in the range of 55% to 60%. Wall   motion was normal; there were no regional wall motion   abnormalities. Features are consistent with a pseudonormal left   ventricular filling pattern, with concomitant abnormal relaxation   and increased filling pressure (grade 2 diastolic dysfunction).   Doppler parameters are consistent with high ventricular filling   pressure. - Aortic valve: Transvalvular velocity was within the normal range.   There was no stenosis. There was no regurgitation. - Aorta: Ascending aortic diameter: 38 mm (S). - Ascending aorta: The ascending aorta was mildly dilated. - Mitral valve: Transvalvular velocity was within the normal range.   There was no evidence for stenosis. There was mild regurgitation. - Left atrium: The atrium was mildly dilated. - Right ventricle: The cavity size was normal. Wall thickness was   normal. Systolic function was normal. - Atrial septum: No defect or patent foramen ovale was identified. - Tricuspid valve: There was trivial regurgitation. - Pulmonary arteries: Systolic pressure was within the normal   range. PA peak pressure: 26 mm Hg (S).  ASSESSMENT AND  PLAN:   1. CP: similar to prior angina although features are somewhat atypical. CP w/o acute ischemic changes, trop has  been negative. Could represent [place] given similarity to prior cardiac sx. Will cycle trops, cont heparin IV gtt. Pt currently pain-free. Will get repeat TTE to reeval LVEF. Plan for LHC in the AM   2. HTN/dyslipidemia/DM2: restart home med regimen and adjust PRN  3. PAF: in sinus at [facility]. Pt on chronic eliquis for CVA prophylaxis; holding eliquis for now as pt currently on IV heparin.   Thank you for the opportunity to participate in the care of this patient.  For questions or updates, please contact [facility] Please consult [email or web address] for contact info under   Signed,  Precious Reel, MD, [facility] 05/17/2019 10:00 PM

## 2019-05-17 NOTE — Telephone Encounter (Signed)
Pt called clinic reporting chest pain over the past 4-5 days since Sunday. States his chest pain occurs during rest. He had been taking his Imdur tablets 1 in the AM and 1 in the PM, he moved them both to the AM which helped slightly, however he still has chest pain at rest, some pain in his neck on the left side as well. States his symptoms feel like they did when he had his 2 previous stents placed. Advised pt to go to ED for further workup, he verbalized understanding.

## 2019-05-18 ENCOUNTER — Encounter (HOSPITAL_COMMUNITY)
Admission: AD | Disposition: A | Discharge status: Home / Self Care | Payer: Self-pay | Source: Other Acute Inpatient Hospital | Attending: Cardiology

## 2019-05-18 ENCOUNTER — Observation Stay (HOSPITAL_BASED_OUTPATIENT_CLINIC_OR_DEPARTMENT_OTHER): Payer: Medicare Other

## 2019-05-18 DIAGNOSIS — Z20828 Contact with and (suspected) exposure to other viral communicable diseases: Secondary | ICD-10-CM | POA: Diagnosis present

## 2019-05-18 DIAGNOSIS — I252 Old myocardial infarction: Secondary | ICD-10-CM | POA: Diagnosis not present

## 2019-05-18 DIAGNOSIS — Z8673 Personal history of transient ischemic attack (TIA), and cerebral infarction without residual deficits: Secondary | ICD-10-CM | POA: Diagnosis not present

## 2019-05-18 DIAGNOSIS — Z9861 Coronary angioplasty status: Secondary | ICD-10-CM

## 2019-05-18 DIAGNOSIS — Z7901 Long term (current) use of anticoagulants: Secondary | ICD-10-CM | POA: Diagnosis not present

## 2019-05-18 DIAGNOSIS — E7800 Pure hypercholesterolemia, unspecified: Secondary | ICD-10-CM | POA: Diagnosis present

## 2019-05-18 DIAGNOSIS — Z8249 Family history of ischemic heart disease and other diseases of the circulatory system: Secondary | ICD-10-CM | POA: Diagnosis not present

## 2019-05-18 DIAGNOSIS — E1151 Type 2 diabetes mellitus with diabetic peripheral angiopathy without gangrene: Secondary | ICD-10-CM | POA: Diagnosis present

## 2019-05-18 DIAGNOSIS — E782 Mixed hyperlipidemia: Secondary | ICD-10-CM | POA: Diagnosis not present

## 2019-05-18 DIAGNOSIS — I119 Hypertensive heart disease without heart failure: Secondary | ICD-10-CM | POA: Diagnosis present

## 2019-05-18 DIAGNOSIS — Z794 Long term (current) use of insulin: Secondary | ICD-10-CM | POA: Diagnosis not present

## 2019-05-18 DIAGNOSIS — I249 Acute ischemic heart disease, unspecified: Secondary | ICD-10-CM

## 2019-05-18 DIAGNOSIS — I2510 Atherosclerotic heart disease of native coronary artery without angina pectoris: Secondary | ICD-10-CM | POA: Diagnosis not present

## 2019-05-18 DIAGNOSIS — E785 Hyperlipidemia, unspecified: Secondary | ICD-10-CM | POA: Diagnosis present

## 2019-05-18 DIAGNOSIS — I739 Peripheral vascular disease, unspecified: Secondary | ICD-10-CM

## 2019-05-18 DIAGNOSIS — T82855A Stenosis of coronary artery stent, initial encounter: Secondary | ICD-10-CM | POA: Diagnosis present

## 2019-05-18 DIAGNOSIS — Y831 Surgical operation with implant of artificial internal device as the cause of abnormal reaction of the patient, or of later complication, without mention of misadventure at the time of the procedure: Secondary | ICD-10-CM | POA: Diagnosis present

## 2019-05-18 DIAGNOSIS — I25110 Atherosclerotic heart disease of native coronary artery with unstable angina pectoris: Secondary | ICD-10-CM | POA: Diagnosis present

## 2019-05-18 DIAGNOSIS — I480 Paroxysmal atrial fibrillation: Secondary | ICD-10-CM | POA: Diagnosis present

## 2019-05-18 DIAGNOSIS — I25118 Atherosclerotic heart disease of native coronary artery with other forms of angina pectoris: Secondary | ICD-10-CM | POA: Diagnosis not present

## 2019-05-18 DIAGNOSIS — R0789 Other chest pain: Secondary | ICD-10-CM | POA: Diagnosis present

## 2019-05-18 DIAGNOSIS — Z79899 Other long term (current) drug therapy: Secondary | ICD-10-CM | POA: Diagnosis not present

## 2019-05-18 DIAGNOSIS — E1159 Type 2 diabetes mellitus with other circulatory complications: Secondary | ICD-10-CM

## 2019-05-18 DIAGNOSIS — Z888 Allergy status to other drugs, medicaments and biological substances status: Secondary | ICD-10-CM | POA: Diagnosis not present

## 2019-05-18 DIAGNOSIS — I200 Unstable angina: Secondary | ICD-10-CM | POA: Diagnosis not present

## 2019-05-18 HISTORY — PX: LEFT HEART CATH AND CORONARY ANGIOGRAPHY: CATH118249

## 2019-05-18 HISTORY — PX: CORONARY STENT INTERVENTION: CATH118234

## 2019-05-18 LAB — BASIC METABOLIC PANEL
Anion gap: 8 (ref 5–15)
Anion gap: 9 (ref 5–15)
BUN: 19 mg/dL (ref 8–23)
BUN: 20 mg/dL (ref 8–23)
CO2: 23 mmol/L (ref 22–32)
CO2: 20 mmol/L — ABNORMAL LOW (ref 22–32)
Calcium: 9.1 mg/dL (ref 8.9–10.3)
Calcium: 9.3 mg/dL (ref 8.9–10.3)
Chloride: 107 mmol/L (ref 98–111)
Chloride: 109 mmol/L (ref 98–111)
Creatinine, Ser: 1.15 mg/dL (ref 0.61–1.24)
Creatinine, Ser: 1.12 mg/dL (ref 0.61–1.24)
GFR calc Af Amer: >60 mL/min (ref >60)
GFR calc Af Amer: >60 mL/min (ref >60)
GFR calc non Af Amer: 60 mL/min — ABNORMAL LOW (ref >60)
GFR calc non Af Amer: >60 mL/min (ref >60)
Glucose, Bld: 182 mg/dL — ABNORMAL HIGH (ref 70–99)
Glucose, Bld: 153 mg/dL — ABNORMAL HIGH (ref 70–99)
Potassium: 4 mmol/L (ref 3.5–5.1)
Potassium: 3.9 mmol/L (ref 3.5–5.1)
Sodium: 138 mmol/L (ref 135–145)
Sodium: 138 mmol/L (ref 135–145)

## 2019-05-18 LAB — CBC WITH DIFFERENTIAL/PLATELET
Abs Immature Granulocytes: 0.04 10*3/uL (ref 0.00–0.07)
Basophils Absolute: 0.1 10*3/uL (ref 0.0–0.1)
Basophils Relative: 1 %
Eosinophils Absolute: 0.5 10*3/uL (ref 0.0–0.5)
Eosinophils Relative: 5 %
HCT: 38 % — ABNORMAL LOW (ref 39.0–52.0)
Hemoglobin: 13.4 g/dL (ref 13.0–17.0)
Immature Granulocytes: 0 %
Lymphocytes Relative: 25 %
Lymphs Abs: 2.5 10*3/uL (ref 0.7–4.0)
MCH: 31.8 pg (ref 26.0–34.0)
MCHC: 35.3 g/dL (ref 30.0–36.0)
MCV: 90.3 fL (ref 80.0–100.0)
Monocytes Absolute: 0.7 10*3/uL (ref 0.1–1.0)
Monocytes Relative: 7 %
Neutro Abs: 6.3 10*3/uL (ref 1.7–7.7)
Neutrophils Relative %: 62 %
Platelets: 238 10*3/uL (ref 150–400)
RBC: 4.21 MIL/uL — ABNORMAL LOW (ref 4.22–5.81)
RDW: 12 % (ref 11.5–15.5)
WBC: 10.2 10*3/uL (ref 4.0–10.5)
nRBC: 0 % (ref 0.0–0.2)

## 2019-05-18 LAB — SARS CORONAVIRUS 2 (TAT 6-24 HRS): SARS Coronavirus 2: NEGATIVE

## 2019-05-18 LAB — HEMOGLOBIN A1C
Hgb A1c MFr Bld: 7.7 % — ABNORMAL HIGH (ref 4.8–5.6)
Mean Plasma Glucose: 174.29 mg/dL

## 2019-05-18 LAB — GLUCOSE, CAPILLARY
Glucose-Capillary: 129 mg/dL — ABNORMAL HIGH (ref 70–99)
Glucose-Capillary: 99 mg/dL (ref 70–99)
Glucose-Capillary: 67 mg/dL — ABNORMAL LOW (ref 70–99)
Glucose-Capillary: 168 mg/dL — ABNORMAL HIGH (ref 70–99)

## 2019-05-18 LAB — APTT
aPTT: 99 seconds — ABNORMAL HIGH (ref 24–36)
aPTT: 105 seconds — ABNORMAL HIGH (ref 24–36)
aPTT: 67 seconds — ABNORMAL HIGH (ref 24–36)

## 2019-05-18 LAB — MAGNESIUM: Magnesium: 1.9 mg/dL (ref 1.7–2.4)

## 2019-05-18 LAB — PROTIME-INR
INR: 1.1 (ref 0.8–1.2)
Prothrombin Time: 14.5 seconds (ref 11.4–15.2)

## 2019-05-18 LAB — LIPID PANEL
Cholesterol: 174 mg/dL (ref 0–200)
HDL: 32 mg/dL — ABNORMAL LOW (ref >40)
LDL Cholesterol: 115 mg/dL — ABNORMAL HIGH (ref 0–99)
Total CHOL/HDL Ratio: 5.4 RATIO
Triglycerides: 136 mg/dL (ref <150)
VLDL: 27 mg/dL (ref 0–40)

## 2019-05-18 LAB — TROPONIN I (HIGH SENSITIVITY)
Troponin I (High Sensitivity): 20 ng/L — ABNORMAL HIGH (ref <18)
Troponin I (High Sensitivity): 23 ng/L — ABNORMAL HIGH (ref <18)

## 2019-05-18 LAB — POCT ACTIVATED CLOTTING TIME
Activated Clotting Time: 246 seconds
Activated Clotting Time: 301 seconds

## 2019-05-18 LAB — HEPARIN LEVEL (UNFRACTIONATED): Heparin Unfractionated: 1.64 IU/mL — ABNORMAL HIGH (ref 0.30–0.70)

## 2019-05-18 LAB — ECHOCARDIOGRAM COMPLETE
Height: 74 in
Weight: 3460.8 oz

## 2019-05-18 LAB — TSH: TSH: 2.172 u[IU]/mL (ref 0.350–4.500)

## 2019-05-18 SURGERY — LEFT HEART CATH AND CORONARY ANGIOGRAPHY
Anesthesia: LOCAL

## 2019-05-18 MED ORDER — HEPARIN (PORCINE) 25000 UT/250ML-% IV SOLN
1100.0000 [IU]/h | INTRAVENOUS | Status: DC
  Administered 2019-05-18: 1100 [IU]/h via INTRAVENOUS
  Filled 2019-05-18: qty 250

## 2019-05-18 MED ORDER — TICAGRELOR 90 MG PO TABS
ORAL_TABLET | ORAL | Status: DC | PRN
  Administered 2019-05-18: 180 mg via ORAL

## 2019-05-18 MED ORDER — ASPIRIN 81 MG PO CHEW: 81.0000 mg | CHEWABLE_TABLET | ORAL | Status: AC

## 2019-05-18 MED ORDER — ONDANSETRON HCL 4 MG/2ML IJ SOLN: 4.0000 mg | Freq: Four times a day (QID) | INTRAMUSCULAR | Status: DC | PRN

## 2019-05-18 MED ORDER — SODIUM CHLORIDE 0.9% FLUSH: 3.0000 mL | INTRAVENOUS | Status: DC | PRN

## 2019-05-18 MED ORDER — LIDOCAINE HCL (PF) 1 % IJ SOLN
INTRAMUSCULAR | Status: DC | PRN
  Administered 2019-05-18: 4 mL

## 2019-05-18 MED ORDER — HEPARIN SODIUM (PORCINE) 1000 UNIT/ML IJ SOLN
INTRAMUSCULAR | Status: AC
  Filled 2019-05-18: qty 1

## 2019-05-18 MED ORDER — NITROGLYCERIN 1 MG/10 ML FOR IR/CATH LAB
INTRA_ARTERIAL | Status: AC
  Filled 2019-05-18: qty 10

## 2019-05-18 MED ORDER — FENTANYL CITRATE (PF) 100 MCG/2ML IJ SOLN
INTRAMUSCULAR | Status: AC
  Filled 2019-05-18: qty 2

## 2019-05-18 MED ORDER — HEPARIN (PORCINE) IN NACL 1000-0.9 UT/500ML-% IV SOLN
INTRAVENOUS | Status: AC
  Filled 2019-05-18: qty 1000

## 2019-05-18 MED ORDER — CLOPIDOGREL BISULFATE 75 MG PO TABS
300.0000 mg | ORAL_TABLET | Freq: Once | ORAL | Status: AC
  Administered 2019-05-19: 300 mg via ORAL
  Filled 2019-05-18: qty 4

## 2019-05-18 MED ORDER — SODIUM CHLORIDE 0.9% FLUSH: 3.0000 mL | Freq: Two times a day (BID) | INTRAVENOUS | Status: DC

## 2019-05-18 MED ORDER — MIDAZOLAM HCL 2 MG/2ML IJ SOLN
INTRAMUSCULAR | Status: AC
  Filled 2019-05-18: qty 2

## 2019-05-18 MED ORDER — ACETAMINOPHEN 325 MG PO TABS: 650.0000 mg | ORAL_TABLET | ORAL | Status: DC | PRN

## 2019-05-18 MED ORDER — VERAPAMIL HCL 2.5 MG/ML IV SOLN
INTRAVENOUS | Status: DC | PRN
  Administered 2019-05-18: 17:00:00 10 mL via INTRA_ARTERIAL

## 2019-05-18 MED ORDER — SODIUM CHLORIDE 0.9% FLUSH
3.0000 mL | Freq: Two times a day (BID) | INTRAVENOUS | Status: DC
  Administered 2019-05-19: 3 mL via INTRAVENOUS

## 2019-05-18 MED ORDER — TICAGRELOR 90 MG PO TABS
90.0000 mg | ORAL_TABLET | Freq: Once | ORAL | Status: AC
  Administered 2019-05-18: 90 mg via ORAL
  Filled 2019-05-18: qty 1

## 2019-05-18 MED ORDER — VERAPAMIL HCL 2.5 MG/ML IV SOLN
INTRAVENOUS | Status: AC
  Filled 2019-05-18: qty 2

## 2019-05-18 MED ORDER — LIDOCAINE HCL (PF) 1 % IJ SOLN
INTRAMUSCULAR | Status: AC
  Filled 2019-05-18: qty 30

## 2019-05-18 MED ORDER — SODIUM CHLORIDE 0.9 % WEIGHT BASED INFUSION: 1.0000 mL/kg/h | INTRAVENOUS | Status: DC

## 2019-05-18 MED ORDER — MIDAZOLAM HCL 2 MG/2ML IJ SOLN
INTRAMUSCULAR | Status: DC | PRN
  Administered 2019-05-18 (×2): 1 mg via INTRAVENOUS

## 2019-05-18 MED ORDER — TICAGRELOR 90 MG PO TABS
ORAL_TABLET | ORAL | Status: AC
  Filled 2019-05-18: qty 2

## 2019-05-18 MED ORDER — SODIUM CHLORIDE 0.9 % IV SOLN
INTRAVENOUS | Status: AC
  Administered 2019-05-18: 18:00:00 via INTRAVENOUS

## 2019-05-18 MED ORDER — IOHEXOL 350 MG/ML SOLN
INTRAVENOUS | Status: DC | PRN
  Administered 2019-05-18: 18:00:00 150 mL

## 2019-05-18 MED ORDER — SODIUM CHLORIDE 0.9 % IV SOLN: 250.0000 mL | INTRAVENOUS | Status: DC | PRN

## 2019-05-18 MED ORDER — CLOPIDOGREL BISULFATE 75 MG PO TABS: 75.0000 mg | ORAL_TABLET | Freq: Every day | ORAL | Status: DC

## 2019-05-18 MED ORDER — LABETALOL HCL 5 MG/ML IV SOLN: 10.0000 mg | INTRAVENOUS | Status: AC | PRN

## 2019-05-18 MED ORDER — HEPARIN (PORCINE) IN NACL 1000-0.9 UT/500ML-% IV SOLN
INTRAVENOUS | Status: DC | PRN
  Administered 2019-05-18: 500 mL

## 2019-05-18 MED ORDER — HEPARIN SODIUM (PORCINE) 1000 UNIT/ML IJ SOLN
INTRAMUSCULAR | Status: DC | PRN
  Administered 2019-05-18 (×2): 5000 [IU] via INTRAVENOUS
  Administered 2019-05-18: 3000 [IU] via INTRAVENOUS

## 2019-05-18 MED ORDER — NITROGLYCERIN 1 MG/10 ML FOR IR/CATH LAB
INTRA_ARTERIAL | Status: DC | PRN
  Administered 2019-05-18: 200 ug via INTRACORONARY

## 2019-05-18 MED ORDER — PERFLUTREN LIPID MICROSPHERE
1.0000 mL | INTRAVENOUS | Status: AC | PRN
  Administered 2019-05-18: 2 mL via INTRAVENOUS
  Filled 2019-05-18: qty 10

## 2019-05-18 MED ORDER — APIXABAN 5 MG PO TABS: 5.0000 mg | ORAL_TABLET | Freq: Once | ORAL | Status: DC

## 2019-05-18 MED ORDER — HYDRALAZINE HCL 20 MG/ML IJ SOLN: 10.0000 mg | INTRAMUSCULAR | Status: AC | PRN

## 2019-05-18 MED ORDER — SODIUM CHLORIDE 0.9 % WEIGHT BASED INFUSION
3.0000 mL/kg/h | INTRAVENOUS | Status: DC
  Administered 2019-05-18: 3 mL/kg/h via INTRAVENOUS

## 2019-05-18 MED ORDER — APIXABAN 5 MG PO TABS
5.0000 mg | ORAL_TABLET | Freq: Two times a day (BID) | ORAL | Status: DC
  Administered 2019-05-19: 5 mg via ORAL
  Filled 2019-05-18: qty 1

## 2019-05-18 MED ORDER — FENTANYL CITRATE (PF) 100 MCG/2ML IJ SOLN
INTRAMUSCULAR | Status: DC | PRN
  Administered 2019-05-18 (×2): 25 ug via INTRAVENOUS

## 2019-05-18 SURGICAL SUPPLY — 21 items
BALLN SAPPHIRE 2.5X15 (BALLOONS) ×2
BALLN ~~LOC~~ EMERGE MR 2.75X12 (BALLOONS) ×2
BALLOON SAPPHIRE 2.5X15 (BALLOONS) IMPLANT
BALLOON ~~LOC~~ EMERGE MR 2.75X12 (BALLOONS) IMPLANT
CATH LAUNCHER 5F JR4 (CATHETERS) ×1 IMPLANT
CATH OPTITORQUE TIG 4.0 5F (CATHETERS) ×1 IMPLANT
DEVICE RAD COMP TR BAND LRG (VASCULAR PRODUCTS) ×1 IMPLANT
GLIDESHEATH SLEND SS 6F .021 (SHEATH) ×2 IMPLANT
GUIDEWIRE INQWIRE 1.5J.035X260 (WIRE) IMPLANT
INQWIRE 1.5J .035X260CM (WIRE) ×2
KIT ENCORE 26 ADVANTAGE (KITS) ×1 IMPLANT
KIT HEART LEFT (KITS) ×2 IMPLANT
PACK CARDIAC CATHETERIZATION (CUSTOM PROCEDURE TRAY) ×2 IMPLANT
SHEATH GLIDE SLENDER 4/5FR (SHEATH) ×1 IMPLANT
SHEATH PINNACLE 5F 10CM (SHEATH) ×1 IMPLANT
SHEATH PINNACLE 6F 10CM (SHEATH) IMPLANT
SHEATH PROBE COVER 6X72 (BAG) ×1 IMPLANT
STENT RESOLUTE ONYX 2.5X22 (Permanent Stent) ×2 IMPLANT
TRANSDUCER W/STOPCOCK (MISCELLANEOUS) ×2 IMPLANT
TUBING CIL FLEX 10 FLL-RA (TUBING) ×2 IMPLANT
WIRE ASAHI PROWATER 180CM (WIRE) ×1 IMPLANT

## 2019-05-18 NOTE — Progress Notes (Signed)
  Echocardiogram 2D Echocardiogram has been performed.  [name] 05/18/2019, 9:32 AM

## 2019-05-18 NOTE — Interval H&P Note (Signed)
History and Physical Interval Note:  05/18/2019 3:50 PM  Joseph [name]  has presented today for surgery, with the diagnosis of unstable angina.  The various methods of treatment have been discussed with the patient and family. After consideration of risks, benefits and other options for treatment, the patient has consented to  Procedure(s): LEFT HEART CATH AND CORONARY ANGIOGRAPHY (N/A)  PERCUTANEOUS CORONARY INVERVENTION   as a surgical intervention.  The patient's history has been reviewed, patient examined, no change in status, stable for surgery.  I have reviewed the patient's chart and labs.  Questions were answered to the patient's satisfaction.     Glenetta Hew

## 2019-05-18 NOTE — Care Management Obs Status (Signed)
[facility] NOTIFICATION   Patient Details  Name: Joseph [name] MRN: [id number] Date of Birth: [birth year]   Medicare Observation Status Notification Given:  Yes    Aquarius Tremper A Jeneva Schweizer, LCSW 05/18/2019, 11:08 AM

## 2019-05-18 NOTE — Progress Notes (Signed)
Patient returned from cath lab at 1820hrs.  Oriented to post cath instructions, verbalized understanding.

## 2019-05-18 NOTE — Progress Notes (Addendum)
ANTICOAGULATION CONSULT NOTE - Initial Consult  Pharmacy Consult for heparin Indication: chest pain/ACS and atrial fibrillation  Allergies  Allergen Reactions  . Bempedoic Acid     Myalgias, GI upset  . [name] [Alirocumab]     Muscle weakness and rash  . Rosuvastatin Other (See Comments)    Low energy/leg and hip pain  . Zetia [Ezetimibe]     myalgias    Patient Measurements: Height: 6\' 2"  (188 cm) Weight: 216 lb 4.8 oz (98.1 kg) IBW/kg (Calculated) : 82.2  Vital Signs: Temp: 97.9 F (36.6 C) (10/29 2117) Temp Source: Oral (10/29 2117) BP: 160/81 (10/29 2117) Pulse Rate: 62 (10/29 2117)  Labs: Recent Labs    05/17/19 2352  HGB 13.4  HCT 38.0*  PLT 238     Medical History: Past Medical History:  Diagnosis Date  . Abdominal discomfort    Abdominal discomfort: EGD 2011 showed no significant abnormalities.  Possibly due to metformin.  . Allergic rhinitis   . Anticoagulated 05/30/2016  . CAD (coronary artery disease)    a. s/p MI in 2004:  stents to the LAD and staged stent RCA, EF 55%;  b.  NSTEMI (1/16):  LHC - mid LAD stent with diff dist restenosis, prox to mid Dx mod dsz jailed by stent, small OM1 99, mCFX 75, dCFX 80, mRCA stent ok, mPDA mild to mod dsz, EF 55% >> PCI:  Synergy DES to LAD; Synergy DES (x2) mid and dist CFX  . 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  s/p back surgery  . DM (diabetes mellitus) ([place])   . History of CVA (cerebrovascular accident)    Small CVA seen by head CT (incidental) in 1/13.  Carotid dopplers in 1/13 showed minimal disease  . History of Doppler ultrasound    Arterial dopplers (3/11): no evidence for significant PAD. ABIs 10/12: Normal.    . HLD (hyperlipidemia)   . HTN (hypertension)    had side effects with chlorthalidone  . Hx of cardiovascular stress test    Myoview (1/13): Small fixed apical septal defect with no ischemia, EF 58%.  [name] Hx of echocardiogram     Echo (3/11): EF 60%, normal wall motion, mild MR, mild left atrial enlargement, mildly dilated ascending aorta.  [name] PAF (paroxysmal atrial fibrillation) ([place]) 05/29/2016  . Spondylolisthesis    L4-S1    Assessment: 78yo male c/o CP x4-5d, improved some when he doubled his Imdur dose but CP at rest cont'd, presented to outside ED and tx'd to [facility] for further cardiac w/u, to continue heparin started at OSH; pt on Eliquis PTA for PAF.  Labs from OSH: WBC 9.8, Hgb 14.3, Plt 261, INR 1.0, PTT 27, troponin <0.01, SCr 1.1.  Home meds: fish oil, Imdur, Zantac, Lopressor, Plavix, Lipitor, CoQ10, lisinopril, Levemir, glipizide, ASA, Norvasc, APAP, Eliquis.  Goal of Therapy:  Heparin level 0.3-0.7 units/ml aPTT 66-102 seconds Monitor platelets by anticoagulation protocol: Yes   Plan:  OSH gave heparin 4000 units IV bolus followed by gtt at 1200 units/hr on 10/29 at 1724; will continue for now and monitor heparin levels, aPTT (while Eliquis affects anti-Xa), and CBC.  Wynona Neat, PharmD, BCPS  05/18/2019,12:13 AM   Addendum: Initial heparin level 1.64 >> above goal though expected with recent DOAC administration; PTT 99 >> at goal. Will continue gtt at current rate and confirm stable with additional PTT.   VB 3:07 AM    Addendum: Now PTT 105,  supratherapeutic.  Will decrease heparin gtt by 1 unit/kg/hr to 1100 units/hr and check PTT in 6 hours.   VB 6:39 AM

## 2019-05-18 NOTE — Progress Notes (Signed)
Progress Note  [name] Date of Encounter: 05/18/2019  Primary Cardiologist: Mertie Moores, MD   Subjective   No more chest pain now.  Just has a strange sensation in his chest.  He said the pain went away just about the time he got into the EMS truck  Inpatient Medications    Scheduled Meds:  amLODipine  10 mg Oral Daily   aspirin  324 mg Oral NOW   Or   aspirin  300 mg Rectal NOW   aspirin EC  81 mg Oral Daily   carvedilol  3.125 mg Oral BID   glipiZIDE  10 mg Oral BID AC   hydrochlorothiazide  25 mg Oral Daily   insulin aspart  0-15 Units Subcutaneous TID WC   insulin aspart  0-5 Units Subcutaneous QHS   insulin glargine  22 Units Subcutaneous q morning - 10a   isosorbide mononitrate  120 mg Oral Daily   lisinopril  20 mg Oral Daily   potassium chloride  10 mEq Oral Daily   Continuous Infusions:  heparin 1,100 Units/hr (05/18/19 0651)   PRN Meds: acetaminophen, diltiazem, fluticasone, gabapentin, nitroGLYCERIN, ondansetron (ZOFRAN) IV, perflutren lipid microspheres (DEFINITY) IV suspension   Vital Signs    Vitals:   05/18/19 0019 05/18/19 0500 05/18/19 0806 05/18/19 0934  BP: (!) 145/74 138/69 138/71 133/73  Pulse: (!) 58 (!) 55 (!) 59 (!) 59  Resp:  20 19   Temp:  (!) 97.5 F (36.4 C)    TempSrc:  Oral    SpO2:  95%    Weight:      Height:       No intake or output data in the 24 hours ending 05/18/19 1100 Last 3 Weights 05/17/2019 04/23/2019 11/07/2018  Weight (lbs) 216 lb 4.8 oz 217 lb 218 lb  Weight (kg) 98.113 kg 98.431 kg 98.884 kg      Telemetry    SR 67 - Personally Reviewed  ECG    S Brady 55, 1Deg AVB, cannot exclude septal MI, age undetermined.  Inferior T wave inversions.-  - Personally Reviewed  Physical Exam   GEN:  Pleasant, healthy-appearing gentleman no acute distress.   Neck: No JVD or carotid bruit Cardiac: RRR, no murmurs, rubs, or gallops.  Respiratory: Clear to auscultation  bilaterally. GI: Soft, nontender, non-distended  MS: No edema; No deformity. Neuro:  Nonfocal, CN II-XII grossly intact Psych: Normal affect   Labs    High Sensitivity Troponin:   Recent Labs  Lab 05/17/19 2352 05/18/19 0210  TROPONINIHS 20* 23*      Chemistry Recent Labs  Lab 05/17/19 2352 05/18/19 0434  NA 138 138  K 4.0 3.9  CL 107 109  CO2 23 20*  GLUCOSE 182* 153*  BUN 19 20  CREATININE 1.15 1.12  CALCIUM 9.1 9.3  GFRNONAA 60* >60  GFRAA >60 >60  ANIONGAP 8 9     Hematology Recent Labs  Lab 05/17/19 2352  WBC 10.2  RBC 4.21*  HGB 13.4  HCT 38.0*  MCV 90.3  MCH 31.8  MCHC 35.3  RDW 12.0  PLT 238    BNPNo results for input(s): BNP, PROBNP in the last 168 hours.   DDimer No results for input(s): DDIMER in the last 168 hours.   Radiology    No results found.  Cardiac Studies    Lexiscan Myoview Nov 28, 2018: EF 60%.  No ischemia or infarction.   Transthoracic Echo March 2019: EF 55 to 60%.  No R WMA.  GRII DD/pseudonormalization.  Mild dilation of ascending aorta.  Mild LA dilation   Cardiac Catheterization April 2016 (in response to exercise Myoview with anterolateral ischemia--culprit lesion thought to be jailed diagonal branch-plan medical management): Mild to moderate diffuse distal LAD disease.  Moderate D1 disease with long area of 80 to 90% (jailed by previous stent) no change from prior.  Small OM1 CTO.  Very small OM 2 okay.  Small OM 390% proximal stenosis similar to prior study.  Large LPL OM patent.  AV groove circumflex stents patent.  Mild luminal irregularities in RCA.  Small PL branch with serial 50% stenosis.  PDA is 45% mid and 30% distal..  Patient Profile     78 y.o. male with known CAD (h/o Ant STEMI 2004 - PCI LAD & staged PCI RCA) followed by NSTEMI in January 2016 -overlapping DES stent in mLAD as well as m-d AVG Cx, PAF, HTN, HLD and DM2 with history of CVA. Patient was transferred from [facility] for symptoms  concerning for escalating chest discomfort.  Patient notes that after his heart catheterization in April 2016 shortly after his MI, he was placed on medications and has been relatively chest pain-free since.  Had a negative Myoview May of this year for preoperative assessment.  However roughly 4 to 5 days ago he started noticing anterior left-sided chest discomfort mostly occurring at rest, usually when lying down.  Not necessarily with exertion.  When prolonged it would radiate into the left neck and shoulder.  Would last several minutes and then resolve spontaneously. He noted some improvement after increasing to twice daily Imdur, but had continued symptoms and therefore called EMS on the evening of 05/17/2019.  He indicated that the symptoms are very similar to his prior anginal equivalent when he had his anterior MI and non-STEMI.  Assessment & Plan    Principal Problem:   Unstable angina (HCC) Active Problems:   Type 2 diabetes mellitus with vascular disease (HCC)   Pure hypercholesterolemia   CAD S/P percutaneous coronary angioplasty   Peripheral vascular disease (HCC)   Paroxysmal atrial fibrillation (HCC)   Coronary artery disease of native artery of native heart with stable angina pectoris (HCC)  Principal Problem:   Unstable angina (HCC)/angina at rest  Coronary artery disease of native artery of native heart with stable angina pectoris (HCC)/  CAD S/P percutaneous coronary angioplasty  Very atypical sounding symptoms, however very similar to his prior MI.  Recommendation from admitting physician was cardiac catheterization.  He is currently on IV heparin and pain-free.  He is having basely anginal symptoms at rest and therefore with him having had a recent nuclear stress test the neck step would be to proceed with cardiac catheterization.  We will plan left heart catheterization today with possible PCI.  We will need to consider to consideration the fact that he is on Eliquis, would  recommend aspirin and Plavix for perhaps 1 month and then stopping aspirin after that.    Type 2 diabetes mellitus with vascular disease (HCC) he does have staining insulin.  Currently using sliding scale  Consider SGLT2 inhibitor      Pure hypercholesterolemia -> not currently on statin; on omega-3 fatty acids.  Will readdress following cardiac catheterization today.    Paroxysmal atrial fibrillation (HCC) -currently in sinus rhythm with low-dose carvedilol.  On Eliquis at home, currently being held (last dose yesterday morning) -> need to take this into consideration of PCI indicated.  He has as  needed diltiazem, not currently taking)     Essential hypertension: On HCTZ and carvedilol. ->  Continue for now his pressures are stable.  We will titrate carvedilol further because of bradycardia.  Was on amlodipine and lisinopril -continued.   For questions or updates, please contact [facility] Please consult [email or web address] for contact info under    Performing MD:  Alcus DadHMG [facility], M.D., M.S.  Procedure:  LEFT HEART CATHETERIZATION WITH CORONARY ANGIOGRAPHY AND POSSIBLE PERCUTANEOUS CORONARY INTERVENTION  The procedure with Risks/Benefits/Alternatives and Indications was reviewed with the patient.  All questions were answered.    Risks / Complications include, but not limited to: Death, MI, CVA/TIA, VF/VT (with defibrillation), Bradycardia (need for temporary pacer placement), contrast induced nephropathy, bleeding / bruising / hematoma / pseudoaneurysm, vascular or coronary injury (with possible emergent CT or Vascular Surgery), adverse medication reactions, infection.  Additional risks involving the use of radiation with the possibility of radiation burns and cancer were explained in detail.  The patient voices understanding and agree to proceed.         Signed, Bryan Lemmaavid Jadd Gasior, MD  05/18/2019, 11:00 AM

## 2019-05-18 NOTE — H&P (View-Only) (Signed)
Progress Note  [name] Date of Encounter: 05/18/2019  Primary Cardiologist: Mertie Moores, MD   Subjective   No more chest pain now.  Just has a strange sensation in his chest.  He said the pain went away just about the time he got into the EMS truck  Inpatient Medications    Scheduled Meds:  amLODipine  10 mg Oral Daily   aspirin  324 mg Oral NOW   Or   aspirin  300 mg Rectal NOW   aspirin EC  81 mg Oral Daily   carvedilol  3.125 mg Oral BID   glipiZIDE  10 mg Oral BID AC   hydrochlorothiazide  25 mg Oral Daily   insulin aspart  0-15 Units Subcutaneous TID WC   insulin aspart  0-5 Units Subcutaneous QHS   insulin glargine  22 Units Subcutaneous q morning - 10a   isosorbide mononitrate  120 mg Oral Daily   lisinopril  20 mg Oral Daily   potassium chloride  10 mEq Oral Daily   Continuous Infusions:  heparin 1,100 Units/hr (05/18/19 0651)   PRN Meds: acetaminophen, diltiazem, fluticasone, gabapentin, nitroGLYCERIN, ondansetron (ZOFRAN) IV, perflutren lipid microspheres (DEFINITY) IV suspension   Vital Signs    Vitals:   05/18/19 0019 05/18/19 0500 05/18/19 0806 05/18/19 0934  BP: (!) 145/74 138/69 138/71 133/73  Pulse: (!) 58 (!) 55 (!) 59 (!) 59  Resp:  20 19   Temp:  (!) 97.5 F (36.4 C)    TempSrc:  Oral    SpO2:  95%    Weight:      Height:       No intake or output data in the 24 hours ending 05/18/19 1100 Last 3 Weights 05/17/2019 04/23/2019 11/07/2018  Weight (lbs) 216 lb 4.8 oz 217 lb 218 lb  Weight (kg) 98.113 kg 98.431 kg 98.884 kg      Telemetry    SR 67 - Personally Reviewed  ECG    S Brady 55, 1Deg AVB, cannot exclude septal MI, age undetermined.  Inferior T wave inversions.-  - Personally Reviewed  Physical Exam   GEN:  Pleasant, healthy-appearing gentleman no acute distress.   Neck: No JVD or carotid bruit Cardiac: RRR, no murmurs, rubs, or gallops.  Respiratory: Clear to auscultation  bilaterally. GI: Soft, nontender, non-distended  MS: No edema; No deformity. Neuro:  Nonfocal, CN II-XII grossly intact Psych: Normal affect   Labs    High Sensitivity Troponin:   Recent Labs  Lab 05/17/19 2352 05/18/19 0210  TROPONINIHS 20* 23*      Chemistry Recent Labs  Lab 05/17/19 2352 05/18/19 0434  NA 138 138  K 4.0 3.9  CL 107 109  CO2 23 20*  GLUCOSE 182* 153*  BUN 19 20  CREATININE 1.15 1.12  CALCIUM 9.1 9.3  GFRNONAA 60* >60  GFRAA >60 >60  ANIONGAP 8 9     Hematology Recent Labs  Lab 05/17/19 2352  WBC 10.2  RBC 4.21*  HGB 13.4  HCT 38.0*  MCV 90.3  MCH 31.8  MCHC 35.3  RDW 12.0  PLT 238    BNPNo results for input(s): BNP, PROBNP in the last 168 hours.   DDimer No results for input(s): DDIMER in the last 168 hours.   Radiology    No results found.  Cardiac Studies    Lexiscan Myoview Nov 28, 2018: EF 60%.  No ischemia or infarction.   Transthoracic Echo March 2019: EF 55 to 60%.  No R WMA.  GRII DD/pseudonormalization.  Mild dilation of ascending aorta.  Mild LA dilation   Cardiac Catheterization April 2016 (in response to exercise Myoview with anterolateral ischemia--culprit lesion thought to be jailed diagonal branch-plan medical management): Mild to moderate diffuse distal LAD disease.  Moderate D1 disease with long area of 80 to 90% (jailed by previous stent) no change from prior.  Small OM1 CTO.  Very small OM 2 okay.  Small OM 390% proximal stenosis similar to prior study.  Large LPL OM patent.  AV groove circumflex stents patent.  Mild luminal irregularities in RCA.  Small PL branch with serial 50% stenosis.  PDA is 45% mid and 30% distal..  Patient Profile     78 y.o. male with known CAD (h/o Ant STEMI 2004 - PCI LAD & staged PCI RCA) followed by NSTEMI in January 2016 -overlapping DES stent in mLAD as well as m-d AVG Cx, PAF, HTN, HLD and DM2 with history of CVA. Patient was transferred from [facility] for symptoms  concerning for escalating chest discomfort.  Patient notes that after his heart catheterization in April 2016 shortly after his MI, he was placed on medications and has been relatively chest pain-free since.  Had a negative Myoview May of this year for preoperative assessment.  However roughly 4 to 5 days ago he started noticing anterior left-sided chest discomfort mostly occurring at rest, usually when lying down.  Not necessarily with exertion.  When prolonged it would radiate into the left neck and shoulder.  Would last several minutes and then resolve spontaneously. He noted some improvement after increasing to twice daily Imdur, but had continued symptoms and therefore called EMS on the evening of 05/17/2019.  He indicated that the symptoms are very similar to his prior anginal equivalent when he had his anterior MI and non-STEMI.  Assessment & Plan    Principal Problem:   Unstable angina (HCC) Active Problems:   Type 2 diabetes mellitus with vascular disease (HCC)   Pure hypercholesterolemia   CAD S/P percutaneous coronary angioplasty   Peripheral vascular disease (HCC)   Paroxysmal atrial fibrillation (HCC)   Coronary artery disease of native artery of native heart with stable angina pectoris (HCC)  Principal Problem:   Unstable angina (HCC)/angina at rest  Coronary artery disease of native artery of native heart with stable angina pectoris (HCC)/  CAD S/P percutaneous coronary angioplasty  Very atypical sounding symptoms, however very similar to his prior MI.  Recommendation from admitting physician was cardiac catheterization.  He is currently on IV heparin and pain-free.  He is having basely anginal symptoms at rest and therefore with him having had a recent nuclear stress test the neck step would be to proceed with cardiac catheterization.  We will plan left heart catheterization today with possible PCI.  We will need to consider to consideration the fact that he is on Eliquis, would  recommend aspirin and Plavix for perhaps 1 month and then stopping aspirin after that.    Type 2 diabetes mellitus with vascular disease (HCC) he does have staining insulin.  Currently using sliding scale  Consider SGLT2 inhibitor      Pure hypercholesterolemia -> not currently on statin; on omega-3 fatty acids.  Will readdress following cardiac catheterization today.    Paroxysmal atrial fibrillation (HCC) -currently in sinus rhythm with low-dose carvedilol.  On Eliquis at home, currently being held (last dose yesterday morning) -> need to take this into consideration of PCI indicated.  He has as  needed diltiazem, not currently taking) °    °Essential hypertension: On HCTZ and carvedilol. ->  Continue for now his pressures are stable.  We will titrate carvedilol further because of bradycardia. °· Was on amlodipine and lisinopril -continued. ° ° °For questions or updates, please contact [facility] °Please consult [email or web address] for contact info under  ° ° °Performing MD:  [facility], M.D., M.S. ° °Procedure:  LEFT HEART CATHETERIZATION WITH CORONARY ANGIOGRAPHY AND POSSIBLE PERCUTANEOUS CORONARY INTERVENTION ° °The procedure with Risks/Benefits/Alternatives and Indications was reviewed with the patient.  All questions were answered.   ° °Risks / Complications include, but not limited to: Death, MI, CVA/TIA, VF/VT (with defibrillation), Bradycardia (need for temporary pacer placement), contrast induced nephropathy, bleeding / bruising / hematoma / pseudoaneurysm, vascular or coronary injury (with possible emergent CT or Vascular Surgery), adverse medication reactions, infection.  Additional risks involving the use of radiation with the possibility of radiation burns and cancer were explained in detail. ° °The patient voices understanding and agree to proceed.   ° °  °   °Signed, °Nashley Cordoba, MD  °05/18/2019, 11:00 AM   ° °

## 2019-05-18 NOTE — Progress Notes (Signed)
Patient taken to cath lab, heparin off.

## 2019-05-19 DIAGNOSIS — E782 Mixed hyperlipidemia: Secondary | ICD-10-CM

## 2019-05-19 DIAGNOSIS — I25118 Atherosclerotic heart disease of native coronary artery with other forms of angina pectoris: Secondary | ICD-10-CM

## 2019-05-19 DIAGNOSIS — I200 Unstable angina: Secondary | ICD-10-CM

## 2019-05-19 DIAGNOSIS — I480 Paroxysmal atrial fibrillation: Secondary | ICD-10-CM | POA: Diagnosis not present

## 2019-05-19 LAB — CBC
HCT: 36.6 % — ABNORMAL LOW (ref 39.0–52.0)
Hemoglobin: 12.9 g/dL — ABNORMAL LOW (ref 13.0–17.0)
MCH: 31.6 pg (ref 26.0–34.0)
MCHC: 35.2 g/dL (ref 30.0–36.0)
MCV: 89.7 fL (ref 80.0–100.0)
Platelets: 237 10*3/uL (ref 150–400)
RBC: 4.08 MIL/uL — ABNORMAL LOW (ref 4.22–5.81)
RDW: 11.9 % (ref 11.5–15.5)
WBC: 10.1 10*3/uL (ref 4.0–10.5)
nRBC: 0 % (ref 0.0–0.2)

## 2019-05-19 LAB — BASIC METABOLIC PANEL WITH GFR
Anion gap: 9 (ref 5–15)
BUN: 19 mg/dL (ref 8–23)
CO2: 20 mmol/L — ABNORMAL LOW (ref 22–32)
Calcium: 9.1 mg/dL (ref 8.9–10.3)
Chloride: 107 mmol/L (ref 98–111)
Creatinine, Ser: 1.22 mg/dL (ref 0.61–1.24)
GFR calc Af Amer: >60 mL/min
GFR calc non Af Amer: 56 mL/min — ABNORMAL LOW
Glucose, Bld: 161 mg/dL — ABNORMAL HIGH (ref 70–99)
Potassium: 3.9 mmol/L (ref 3.5–5.1)
Sodium: 136 mmol/L (ref 135–145)

## 2019-05-19 LAB — GLUCOSE, CAPILLARY
Glucose-Capillary: 131 mg/dL — ABNORMAL HIGH (ref 70–99)
Glucose-Capillary: 176 mg/dL — ABNORMAL HIGH (ref 70–99)

## 2019-05-19 MED ORDER — ASPIRIN 81 MG PO TBEC: 81.0000 mg | DELAYED_RELEASE_TABLET | Freq: Every day | ORAL | Status: DC

## 2019-05-19 MED ORDER — CLOPIDOGREL BISULFATE 75 MG PO TABS: 75.0000 mg | ORAL_TABLET | Freq: Every day | ORAL | 11 refills | Status: DC

## 2019-05-19 MED ORDER — ISOSORBIDE MONONITRATE ER 120 MG PO TB24: 120.0000 mg | ORAL_TABLET | Freq: Every day | ORAL | 6 refills | Status: DC

## 2019-05-19 NOTE — Plan of Care (Signed)
  Problem: Clinical Measurements: Goal: Will remain free from infection Outcome: Progressing Goal: Diagnostic test results will improve Outcome: Progressing   Problem: Pain Managment: Goal: General experience of comfort will improve Outcome: Progressing   Problem: Safety: Goal: Ability to remain free from injury will improve Outcome: Progressing   Problem: Skin Integrity: Goal: Risk for impaired skin integrity will decrease Outcome: Progressing   

## 2019-05-19 NOTE — Progress Notes (Signed)
CARDIAC REHAB PHASE I   PRE:  Rate/Rhythm: 64 SR  BP:  Supine: 127/61  Sitting:   Standing:    SaO2: 97% RA  MODE:  Ambulation: 470 ft   POST:  Rate/Rhythm: 92  BP:  Supine: 145/65  Sitting:   Standing:    SaO2: 98% RA  [id number] Patient tolerated ambulation well without symptoms. Reviewed PCI/stent education including restrictions, CP, NTG use, and calling 911, diabetic and heart healthy diet and exercise guidelines. Pt verbalizes understanding of information given. Discussed phase 2 cardiac rehab with patient, and pt is interested in the program at [facility] in [place], will send referral.  Sol Passer, MS, ACSM CEP

## 2019-05-19 NOTE — Progress Notes (Addendum)
Progress Note  [name] Date of Encounter: 05/19/2019  Primary Cardiologist: Kristeen Miss, MD   Subjective   78 year old gentleman with a history of coronary artery disease who I see as an outpatient.  He has a history of PCI in the past.  He has a history of hyperlipidemia but is not intolerant to statins and [name]. He was admitted to the hospital several days ago with chest discomfort.  Heart catheterization yesterday revealed distal stenosis in the right coronary artery and the posterior descending artery.  He had a drug-eluting stent placed over the distal PDA and a second stent placed into the posterior descending artery. The LAD had a 30% stenosis.  The second marginal had a 75% stenosis but was too small for PCI.  It was unchanged from previous cath in 2016.  Left ventriculogram reveals mildly depressed left ventricular systolic function with an ejection fraction of 45 to 50%.  He has mid anterior apical anterior hypokinesis.  Has a history of paroxysmal atrial fibrillation.  Has been on Eliquis. The plan is to go with Plavix, aspirin, Eliquis for 1 month.  Then DC aspirin.   Inpatient Medications    Scheduled Meds: . amLODipine  10 mg Oral Daily  . apixaban  5 mg Oral BID  . aspirin EC  81 mg Oral Daily  . carvedilol  3.125 mg Oral BID  . [START ON 05/20/2019] clopidogrel  75 mg Oral Daily  . glipiZIDE  10 mg Oral BID AC  . hydrochlorothiazide  25 mg Oral Daily  . insulin aspart  0-15 Units Subcutaneous TID WC  . insulin aspart  0-5 Units Subcutaneous QHS  . insulin glargine  22 Units Subcutaneous q morning - 10a  . isosorbide mononitrate  120 mg Oral Daily  . lisinopril  20 mg Oral Daily  . potassium chloride  10 mEq Oral Daily  . sodium chloride flush  3 mL Intravenous Q12H   Continuous Infusions: . sodium chloride     PRN Meds: sodium chloride, acetaminophen, diltiazem, fluticasone, gabapentin, nitroGLYCERIN, ondansetron (ZOFRAN) IV,  sodium chloride flush   Vital Signs    Vitals:   05/19/19 0511 05/19/19 0520 05/19/19 0830 05/19/19 0953  BP: 131/65  130/80 127/61  Pulse: (!) 58  64 64  Resp: 19  (!) 24   Temp: 98.5 F (36.9 C)     TempSrc: Oral     SpO2: 98%  97%   Weight:  95.9 kg    Height:        Intake/Output Summary (Last 24 hours) at 05/19/2019 1305 Last data filed at 05/19/2019 0800 Gross per 24 hour  Intake 1176.55 ml  Output 1700 ml  Net -523.45 ml   Last 3 Weights 05/19/2019 05/17/2019 04/23/2019  Weight (lbs) 211 lb 6.7 oz 216 lb 4.8 oz 217 lb  Weight (kg) 95.9 kg 98.113 kg 98.431 kg      Telemetry    NSR  - Personally Reviewed  ECG      - Personally Reviewed  Physical Exam   GEN: No acute distress.   Neck: No JVD Cardiac: RRR, no murmurs, rubs, or gallops.  Respiratory: Clear to auscultation bilaterally. GI: Soft, nontender, non-distended  MS: No edema;  R radial cath site looks ok.  Diminished R radial pulse,  R ulnar pulse feels ok   Neuro:  Nonfocal  Psych: Normal affect   Labs    High Sensitivity Troponin:   Recent Labs  Lab 05/17/19 2352 05/18/19  0210  TROPONINIHS 20* 23*      Chemistry Recent Labs  Lab 05/17/19 2352 05/18/19 0434 05/19/19 0401  NA 138 138 136  K 4.0 3.9 3.9  CL 107 109 107  CO2 23 20* 20*  GLUCOSE 182* 153* 161*  BUN 19 20 19   CREATININE 1.15 1.12 1.22  CALCIUM 9.1 9.3 9.1  GFRNONAA 60* >60 56*  GFRAA >60 >60 >60  ANIONGAP 8 9 9      Hematology Recent Labs  Lab 05/17/19 2352 05/19/19 0401  WBC 10.2 10.1  RBC 4.21* 4.08*  HGB 13.4 12.9*  HCT 38.0* 36.6*  MCV 90.3 89.7  MCH 31.8 31.6  MCHC 35.3 35.2  RDW 12.0 11.9  PLT 238 237    BNPNo results for input(s): BNP, PROBNP in the last 168 hours.   DDimer No results for input(s): DDIMER in the last 168 hours.   Radiology    No results found.  Cardiac Studies     Patient Profile     78 y.o. male with CAd   Assessment & Plan    Coronary artery disease: Patient  was admitted with symptoms of unstable angina.  He is found to have a tight RCA stenosis.  He status post stenting of his distal right coronary artery and posterior descending artery. He will be on Plavix 75 mg a day, aspirin 81 mg a day, Eliquis 5 mg twice a day for 1 month.  At that point we will discontinue the aspirin and continue the Plavix and Eliquis for at least 1 year and possibly indefinitely.  He will call us if he has any episodes of chest pain.  He will follow-up with our APP in several weeks.  2.  Hyperlipidemia: He did not tolerate statins or the PCSK9 inhibitors.  He is on an experimental drug that he receives every 3 months.  He will continue to follow-up with our lipid clinic.  3.  Hypertension: Continue current medications.  4.  PAF:   Cont. eliquis   For questions or updates, please contact [place] Please consult [email or web address] for contact info under        Signed, Mertie Moores, MD  05/19/2019, 1:05 PM

## 2019-05-19 NOTE — Discharge Summary (Addendum)
Discharge Summary    Patient ID: Joseph [name] MRN: [id number]; DOB: 06/25/1941  Admit date: 05/17/2019 Discharge date: 05/19/2019  Primary Care Provider: Imagene Riches, NP  Primary Cardiologist: Joseph Moores, MD  Primary Electrophysiologist:  None   Discharge Diagnoses    Principal Problem:   Unstable angina [name]) Active Problems:   Paroxysmal atrial fibrillation ([place])   Type 2 diabetes mellitus with vascular disease ([place])   Pure hypercholesterolemia   CAD S/P percutaneous coronary angioplasty   Peripheral vascular disease ([place])   Coronary artery disease of native artery of native heart with stable angina pectoris ([place])    Diagnostic Studies/Procedures    ECHO 05/18/19  IMPRESSIONS    1. Left ventricular ejection fraction, by visual estimation, is 55 to 60%. The left ventricle has normal function. There is moderately increased left ventricular hypertrophy.  2. Entire anterior septum, mid anterior segment, and apex are abnormal.  3. Definity contrast agent was given IV to delineate the left ventricular endocardial borders.  4. Elevated left ventricular end-diastolic pressure.  5. Left ventricular diastolic parameters are consistent with Grade I diastolic dysfunction (impaired relaxation).  6. The left ventricle demonstrates regional wall motion abnormalities.  7. Global right ventricle has normal systolic function.The right ventricular size is normal. No increase in right ventricular wall thickness.  8. Left atrial size was normal.  9. Right atrial size was normal. 10. The mitral valve is normal in structure. No evidence of mitral valve regurgitation. No evidence of mitral stenosis. 11. The tricuspid valve is normal in structure. Tricuspid valve regurgitation is not demonstrated. 12. The aortic valve is normal in structure. Aortic valve regurgitation is not visualized. No evidence of aortic valve sclerosis or stenosis. 13. The pulmonic valve was normal in structure.  Pulmonic valve regurgitation is not visualized. 14. The inferior vena cava is normal in size with greater than 50% respiratory variability, suggesting right atrial pressure of 3 mmHg. 15. Hypokinesis consistent with ischemia in the LAD territory, overall LVEF is preserved because other walls are hyperdynamic.  FINDINGS  Left Ventricle: Left ventricular ejection fraction, by visual estimation, is 55 to 60%. The left ventricle has normal function. Definity contrast agent was given IV to delineate the left ventricular endocardial borders. The left ventricle demonstrates  regional wall motion abnormalities. There is moderately increased left ventricular hypertrophy. Concentric left ventricular hypertrophy. Left ventricular diastolic parameters are consistent with Grade I diastolic dysfunction (impaired relaxation).  Elevated left ventricular end-diastolic pressure.    LV Wall Scoring: The entire anterior septum, mid anterior segment, and apex are hypokinetic.  Right Ventricle: The right ventricular size is normal. No increase in right ventricular wall thickness. Global RV systolic function is has normal systolic function.  Left Atrium: Left atrial size was normal in size.  Right Atrium: Right atrial size was normal in size  Pericardium: There is no evidence of pericardial effusion.  Mitral Valve: The mitral valve is normal in structure. No evidence of mitral valve stenosis by observation. No evidence of mitral valve regurgitation.  Tricuspid Valve: The tricuspid valve is normal in structure. Tricuspid valve regurgitation is not demonstrated.  Aortic Valve: The aortic valve is normal in structure. Aortic valve regurgitation is not visualized. The aortic valve is structurally normal, with no evidence of sclerosis or stenosis.  Pulmonic Valve: The pulmonic valve was normal in structure. Pulmonic valve regurgitation is not visualized.  Aorta: The aortic root, ascending aorta and  aortic arch are all structurally normal, with no evidence of dilitation or  obstruction.  Venous: The inferior vena cava is normal in size with greater than 50% respiratory variability, suggesting right atrial pressure of 3 mmHg.  IAS/Shunts: No atrial level shunt detected by color flow Doppler. No ventricular septal defect is seen or detected. There is no evidence of an atrial septal defect.    Cardiac cath and PCI 05/07/29   CULPRIT LESIONS: Dist RCA lesion is 90% stenosed with 90% stenosed side branch in RPDA. followed by RPDA lesion is 70% stenosed.  A drug-eluting stent was successfully placed covering the more distal segment in the PDA using a STENT RESOLUTE ONYX 2.5X22. Postdilated 2.6 mm  A second drug-eluting stent was successfully placed covering the proximal lesion into the distal RCA in overlapping the PDA stent, using a STENT RESOLUTE ONYX 2.5X22. Postdilated to 2.9 mm  2 Overlapping stents used b/c unable to advance a 38 mm stent due to tortuous RCA & lack of Guide support.  Post intervention, there is a 0% residual stenosis.  Post intervention, the side branch was reduced to 0% residual stenosis.  -------------------------------------------------------------  Prox RCA to Mid RCA previously placed stents are ~5% stenosed.  Prox LAD to Mid LAD lesion is 30% stenosed. This is followed by mid LAD to [facility] LAD overlapping stents with 30% in-stent restenosis and with stable (from 2016) 70% ostial-proximal & 75% mid stenosis in side branch in 2nd Diag.  2nd Mrg lesion is 75% stenosed -- Not PCI target (stable from 2016)  -------------------------------------------------------------  There is mild left ventricular systolic dysfunction. The left ventricular ejection fraction is 45-50% by visual estimate. Mid anterior-apical anterior hypokinesis. LV end diastolic pressure is normal.   SUMMARY  Multivessel CAD involving RCA-PDA, LAD & 1st Diag as well as 1st Mrg. -->  Stable  disease in the small side branches with mild LAD ISR and widely patent RCA stents.  CULPRIT LESION: Distal RCA-PDA 90% followed a 70% stenosis ? successfully treated with 2 overlapping Resolute Onyx DES stents (2.5 mm x 22 mm x 2 -postdilated from 2.9-2.6 mm)  Codominant system with relatively small caliber LAD and very large circumflex with LPLOM branch and RCA basically giving the RPDA that is a wraparound PDA covering the distal anterior apex  Mildly reduced LVEF with anterior hypokinesis -EF roughly 45 to 50%.  Normal LVEDP   RECOMMENDATIONS  Patient will return to nursing unit after procedure for TR band removal.  Anticipate discharge in the morning.  Was bolused with Brilinta here in the Cath Lab, will give 1 more dose of Brilinta tonight and convert to catheter goal in the morning 10/31 -> 300 mg clopidogrel written for (ensure that this is given, would then take 75 mg daily going forward   Continue aggressive risk factor modification  Follow-up with Dr. Elease Hashimoto  _____________   History of Present Illness     KRATOS RUSCITTI is a 78 y.o. male with  h/o CAD (s/p anterior MI in 2004 s/p PCI to LAD, with staged PCI to RCA; subsequent NSTEMI in Jan 2016 s/p DES to mLAD and DES to mid-distal LCx), paroxysmal afib, HTN, dyslipidemia, DM2, h/o CVA presents as transfer from [place] hospital after presenting there w/ escalating chest discomfort.    Chest pain began about 4-5 days prior to admit sharp pain in center and L side of chest and would radiate into L neck.  Lasts several mins. Then resolves.  No associated symptoms.  This is how his prior angina presented.  Placed on IV heparin, pt was pain  free on exam.  Plan to admit and cardiac cath.  Labs were normal. CVID neg.  troponins neg.   Hospital Course     Consultants: none   Pt remained stable and underwent cardiac cath 05/18/19 with results as above.  Successfully placed overlapping Stents to distal RCA-PDA  EF 45-50%  And by  echo normal. The second marginal had a 75% stenosis but was too small for PCI.  It was unchanged from previous cath in 2016.  Left ventriculogram reveals mildly depressed left ventricular systolic function with an ejection fraction of 45 to 50%.  He has mid anterior apical anterior hypokinesis.  Today pt is doing well, ambulated with cardiac rehab. Was seen and found stable for discharge by Dr. Elease Hashimoto.  Will follow up as outpt.   Plan for plavix ASA and eliquis for 1 month then stop asprin.  Continue the plavix and Eliquis for 1 year and possibly indefinitely.   For his HLD does not tolerate statin or PCSK9 but is on experimental drug he receives every 3 months.  He will continue.          Did the patient have an acute coronary syndrome (MI, NSTEMI, STEMI, etc) this admission?:  No                               Did the patient have a percutaneous coronary intervention (stent / angioplasty)?:  Yes.     Cath/PCI Registry Performance & Quality Measures: 1. Aspirin prescribed? - Yes 2. ADP Receptor Inhibitor (Plavix/Clopidogrel, Brilinta/Ticagrelor or Effient/Prasugrel) prescribed (includes medically managed patients)? - Yes 3. High Intensity Statin (Lipitor 40-80mg  or Crestor 20-40mg ) prescribed? - No - intolerant on experimental med 4. For EF <40%, was ACEI/ARB prescribed? - Not Applicable (EF >/= 40%) 5. For EF <40%, Aldosterone Antagonist (Spironolactone or Eplerenone) prescribed? - Not Applicable (EF >/= 40%) 6. Cardiac Rehab Phase II ordered (Included Medically managed Patients)? - Yes   _____________  Discharge Vitals Blood pressure 126/71, pulse 66, temperature 97.6 F (36.4 C), temperature source Oral, resp. rate 18, height 6\' 2"  (1.88 m), weight 95.9 kg, SpO2 96 %.  Filed Weights   05/17/19 2117 05/19/19 0520  Weight: 98.1 kg 95.9 kg    Labs & Radiologic Studies    CBC Recent Labs    05/17/19 2352 05/19/19 0401  WBC 10.2 10.1  NEUTROABS 6.3  --   HGB 13.4 12.9*  HCT  38.0* 36.6*  MCV 90.3 89.7  PLT 238 237   Basic Metabolic Panel Recent Labs    05/21/19 2352 05/18/19 0434 05/19/19 0401  NA 138 138 136  K 4.0 3.9 3.9  CL 107 109 107  CO2 23 20* 20*  GLUCOSE 182* 153* 161*  BUN 19 20 19   CREATININE 1.15 1.12 1.22  CALCIUM 9.1 9.3 9.1  MG 1.9  --   --    Liver Function Tests No results for input(s): AST, ALT, ALKPHOS, BILITOT, PROT, ALBUMIN in the last 72 hours. No results for input(s): LIPASE, AMYLASE in the last 72 hours. High Sensitivity Troponin:   Recent Labs  Lab 05/17/19 2352 05/18/19 0210  TROPONINIHS 20* 23*    BNP Invalid input(s): POCBNP D-Dimer No results for input(s): DDIMER in the last 72 hours. Hemoglobin A1C Recent Labs    05/17/19 2352  HGBA1C 7.7*   Fasting Lipid Panel Recent Labs    05/18/19 0210  CHOL 174  HDL 32*  LDLCALC 115*  TRIG 136  CHOLHDL 5.4   Thyroid Function Tests Recent Labs    05/17/19 2352  TSH 2.172   _____________  No results found. Disposition   Pt is being discharged home today in good condition.  Follow-up Plans & Appointments   Call [facility] at [phone] if any bleeding, swelling or drainage at cath site.  May shower, no tub baths for 48 hours for groin sticks. No lifting over 5 pounds for 3 days.  No Driving for 3 days  Heart Healthy diabetic diet.  You will be on asprin, plavix and eliquis for I month then may stop Asprin.   Call if any bleeding occurs.    The asprin and plavix for the stents.    Continue your medication for cholesterol that you take every 3 months.  Follow-up Information    Divante Kotch, Deloris Ping, MD Follow up.   Specialty: Cardiology Why: the office will call Monday or Tuesday to schedule appt if you have not heard by Wed. call the office.  Contact information: [address] [place] [address] [phone]          Discharge Instructions    Amb Referral to Cardiac Rehabilitation   Complete by:  As directed    Referral to cardiac rehab program at [facility] in [place], [place].   Diagnosis: Coronary Stents   After initial evaluation and assessments completed: Virtual Based Care may be provided alone or in conjunction with Phase 2 Cardiac Rehab based on patient barriers.: Yes      Discharge Medications   Allergies as of 05/19/2019      Reactions   Bempedoic Acid Other (See Comments)   NEXLETOL Myalgias, GI upset   [name] [alirocumab] Other (See Comments)   Muscle weakness and rash   Rosuvastatin Other (See Comments)   Low energy/leg and hip pain   Zetia [ezetimibe] Other (See Comments)   myalgias      Medication List    STOP taking these medications   ibuprofen 200 MG tablet Commonly known as: ADVIL     TAKE these medications   acetaminophen 650 MG CR tablet Commonly known as: TYLENOL Take 1,300 mg by mouth every 8 (eight) hours as needed for pain.   amLODipine 10 MG tablet Commonly known as: NORVASC Take 10 mg by mouth daily.   apixaban 5 MG Tabs tablet Commonly known as: ELIQUIS Take 1 tablet (5 mg total) by mouth 2 (two) times daily. DO NOT TAKE FOR 3 MORE DAYS. MAY RESTART TAKING IT ON 01/01/2018. What changed: additional instructions   aspirin 81 MG EC tablet Take 1 tablet (81 mg total) by mouth daily. Start taking on: May 20, 2019   carvedilol 3.125 MG tablet Commonly known as: COREG Take 1 tablet by mouth 2 (two) times daily.   clopidogrel 75 MG tablet Commonly known as: PLAVIX Take 1 tablet (75 mg total) by mouth daily. Start taking on: May 20, 2019   Coenzyme Q10 100 MG capsule Take 1 capsule by mouth daily.   diltiazem 30 MG tablet Commonly known as: Cardizem Take 1 tablet (30 mg total) by mouth as needed (Rapid Palpitations).   Fish Oil 1000 MG Caps Take 4 capsules (4,000 mg total) by mouth daily.   fluticasone 50 MCG/ACT nasal spray Commonly known as: FLONASE Place 2 sprays into both nostrils daily as needed for  allergies or rhinitis.   gabapentin 300 MG capsule Commonly known as: NEURONTIN Take 300 mg by mouth  at bedtime as needed (back pain).   GARLIC PO Take 1 capsule by mouth 2 (two) times daily.   glipiZIDE 10 MG tablet Commonly known as: GLUCOTROL Take 10 mg by mouth 2 (two) times daily before a meal.   hydrochlorothiazide 25 MG tablet Commonly known as: HYDRODIURIL Take 1 tablet (25 mg total) by mouth daily.   hydroxypropyl methylcellulose / hypromellose 2.5 % ophthalmic solution Commonly known as: ISOPTO TEARS / GONIOVISC Place 1 drop into both eyes 3 (three) times daily as needed for dry eyes.   Insulin Degludec 100 UNIT/ML Soln Inject 22 Units into the skin every morning.   isosorbide mononitrate 120 MG 24 hr tablet Commonly known as: IMDUR Take 1 tablet (120 mg total) by mouth daily. Start taking on: May 20, 2019 What changed: medication strength   lisinopril 20 MG tablet Commonly known as: ZESTRIL Take 1 tablet (20 mg total) by mouth daily.   nitroGLYCERIN 0.4 MG SL tablet Commonly known as: NITROSTAT Place 0.4 mg under the tongue every 5 (five) minutes as needed for chest pain (x 3 doses).   potassium chloride 10 MEQ tablet Commonly known as: KLOR-CON Take 1 tablet (10 mEq total) by mouth daily.   triamcinolone cream 0.5 % Commonly known as: KENALOG Apply 1 application topically as needed for irritation.          Outstanding Labs/Studies   BMP   Duration of Discharge Encounter   Greater than 30 minutes including physician time.  Signed, Nada BoozerLaura Ingold, NP 05/19/2019, 2:15 PM   Attending Note:   The patient was seen and examined.  Agree with assessment and plan as noted above.  Changes made to the above note as needed.  Patient seen and independently examined with  Nada BoozerLaura Ingold, NP.   We discussed all aspects of the encounter. I agree with the assessment and plan as stated above.  1.  CAD :  S/p PCI of RCA.   See progress note from same day   Doing well.  2.   HTN:  Cont. Current meds  3.   PAF :  Cont eliquis     I have spent a total of 40 minutes with patient reviewing hospital  notes , telemetry, EKGs, labs and examining patient as well as establishing an assessment and plan that was discussed with the patient. > 50% of time was spent in direct patient care.    Vesta MixerPhilip J. Anglia Blakley, Montez HagemanJr., MD, Northeast Methodist HospitalFACC 05/20/2019, 8:12 PM [address],  [address] Office [phone] Pager [phone]

## 2019-05-19 NOTE — Plan of Care (Signed)
  Problem: Clinical Measurements: Goal: Will remain free from infection Outcome: Progressing   Problem: Pain Managment: Goal: General experience of comfort will improve Outcome: Progressing   Problem: Safety: Goal: Ability to remain free from injury will improve Outcome: Progressing   Problem: Skin Integrity: Goal: Risk for impaired skin integrity will decrease Outcome: Progressing   Problem: Safety: Goal: Ability to remain free from injury will improve Outcome: Progressing   Problem: Education: Goal: Knowledge of General Education information will improve Description: Including pain rating scale, medication(s)/side effects and non-pharmacologic comfort measures Outcome: Progressing   Problem: Clinical Measurements: Goal: Will remain free from infection Outcome: Progressing   Gerarda Fraction, RN

## 2019-05-19 NOTE — Discharge Instructions (Signed)
Call [facility] at [phone] if any bleeding, swelling or drainage at cath site.  May shower, no tub baths for 48 hours for groin sticks. No lifting over 5 pounds for 3 days.  No Driving for 3 days  Heart Healthy diabetic diet.  You will be on asprin, plavix and eliquis for I month then may stop Asprin.   Call if any bleeding occurs.    The asprin and plavix for the stents.   Continue your medication for cholesterol that you take every 3 months.

## 2019-05-19 NOTE — Progress Notes (Signed)
Patient had uneventful day, patient ambulated hall and sat in chair for 30 minutes.

## 2019-05-21 ENCOUNTER — Encounter (HOSPITAL_COMMUNITY): Payer: Self-pay | Admitting: Cardiology

## 2019-06-02 ENCOUNTER — Telehealth: Payer: Self-pay | Admitting: Cardiovascular Disease

## 2019-06-02 NOTE — Telephone Encounter (Signed)
Cardiology Telephone Note  Mr. Wild called today regarding some new symptoms he is having.  He reports the sudden on set of weakness, chest tightness, and nausea at 12 pm today that occurred when he was mowing with a riding mower.  He denies chest pain, and says that these symptoms are very different than what he experienced with his recent episode of unstable angina.  His blood glucose is 160, and blood pressure in the 160s/80s.   His symptoms have improved somewhat over the past 4-5 hours, but are still present, and he would like advice about what to do.  He is concerned that his plavix or apixaban may be contributing to his symptoms.  I advised him that it is impossible to provide a diagnosis for his symptoms over the phone, but that it did not sound like an acute MI due to the fact that his symptoms were very different than his prior presentation.  I do not think it is a side effect from plavix or apixaban.  However, I also advised him that it would be reasonable to be seen in the ED tonight to get a better understanding of what is going on.  He decided to try eating something first and see if he feels better.

## 2019-06-05 ENCOUNTER — Telehealth: Payer: Self-pay | Admitting: Cardiovascular Disease

## 2019-06-05 NOTE — Telephone Encounter (Addendum)
Pts daughter, Lovell Sheehan, called to report the pt has been experiencing on and off chest pain for about 4 days.. he has been very weak since Saturday 06/02/19... he has been eating and drinking but still not feeling any better.. he was very nauseated over the weekend but did not vomit.   His BP today is 160/80.[name] he is not having SOB, dizziness, fever, cough.   She says she called the MD on call on 06/02/19 and was advised to consider the ED..but they did not go.   He has been having chest pain more frequently the last several hours and it woke him up 3-4 times last night.   Advised her I will talk with the DOD and call her back shortly.   After talking with the DOD.[name] she was advised that he should go to the ED for assessment and she agreed.

## 2019-06-05 NOTE — Telephone Encounter (Signed)
Pt c/o of Chest Pain: STAT if CP now or developed within 24 hours  1. Are you having CP right now?  Chest Tightness- Not at this time  2. Are you experiencing any other symptoms (ex. SOB, nausea, vomiting, sweating)? Nausea, comes and go, but is weak  3. How long have you been experiencing CP? 4 days  4. Is your CP continuous or coming and going?  Tt comes and goes  5. Have you taken Nitroglycerin? no ?

## 2019-06-07 ENCOUNTER — Inpatient Hospital Stay (HOSPITAL_COMMUNITY)
Admission: EM | Admit: 2019-06-07 | Discharge: 2019-06-12 | DRG: 287 | Disposition: A | Discharge status: Home / Self Care | Payer: Medicare Other | Attending: Cardiovascular Disease | Admitting: Cardiovascular Disease

## 2019-06-07 ENCOUNTER — Emergency Department (HOSPITAL_COMMUNITY): Payer: Medicare Other

## 2019-06-07 ENCOUNTER — Other Ambulatory Visit: Payer: Self-pay

## 2019-06-07 ENCOUNTER — Encounter (HOSPITAL_COMMUNITY): Payer: Self-pay | Admitting: Emergency Medicine

## 2019-06-07 DIAGNOSIS — E1159 Type 2 diabetes mellitus with other circulatory complications: Secondary | ICD-10-CM | POA: Diagnosis present

## 2019-06-07 DIAGNOSIS — E669 Obesity, unspecified: Secondary | ICD-10-CM | POA: Diagnosis present

## 2019-06-07 DIAGNOSIS — I25118 Atherosclerotic heart disease of native coronary artery with other forms of angina pectoris: Secondary | ICD-10-CM | POA: Diagnosis not present

## 2019-06-07 DIAGNOSIS — Z888 Allergy status to other drugs, medicaments and biological substances status: Secondary | ICD-10-CM | POA: Diagnosis not present

## 2019-06-07 DIAGNOSIS — Z7982 Long term (current) use of aspirin: Secondary | ICD-10-CM | POA: Diagnosis not present

## 2019-06-07 DIAGNOSIS — T82855A Stenosis of coronary artery stent, initial encounter: Secondary | ICD-10-CM | POA: Diagnosis present

## 2019-06-07 DIAGNOSIS — Z6827 Body mass index (BMI) 27.0-27.9, adult: Secondary | ICD-10-CM | POA: Diagnosis not present

## 2019-06-07 DIAGNOSIS — E785 Hyperlipidemia, unspecified: Secondary | ICD-10-CM | POA: Diagnosis present

## 2019-06-07 DIAGNOSIS — Z7901 Long term (current) use of anticoagulants: Secondary | ICD-10-CM | POA: Diagnosis not present

## 2019-06-07 DIAGNOSIS — Z79899 Other long term (current) drug therapy: Secondary | ICD-10-CM | POA: Diagnosis not present

## 2019-06-07 DIAGNOSIS — I10 Essential (primary) hypertension: Secondary | ICD-10-CM | POA: Diagnosis present

## 2019-06-07 DIAGNOSIS — I480 Paroxysmal atrial fibrillation: Secondary | ICD-10-CM | POA: Diagnosis present

## 2019-06-07 DIAGNOSIS — Z8673 Personal history of transient ischemic attack (TIA), and cerebral infarction without residual deficits: Secondary | ICD-10-CM | POA: Diagnosis not present

## 2019-06-07 DIAGNOSIS — I200 Unstable angina: Secondary | ICD-10-CM | POA: Diagnosis present

## 2019-06-07 DIAGNOSIS — Z794 Long term (current) use of insulin: Secondary | ICD-10-CM

## 2019-06-07 DIAGNOSIS — I25110 Atherosclerotic heart disease of native coronary artery with unstable angina pectoris: Principal | ICD-10-CM | POA: Diagnosis present

## 2019-06-07 DIAGNOSIS — R079 Chest pain, unspecified: Secondary | ICD-10-CM | POA: Diagnosis not present

## 2019-06-07 DIAGNOSIS — Z955 Presence of coronary angioplasty implant and graft: Secondary | ICD-10-CM

## 2019-06-07 DIAGNOSIS — Z8249 Family history of ischemic heart disease and other diseases of the circulatory system: Secondary | ICD-10-CM

## 2019-06-07 DIAGNOSIS — Z91048 Other nonmedicinal substance allergy status: Secondary | ICD-10-CM | POA: Diagnosis not present

## 2019-06-07 DIAGNOSIS — I2510 Atherosclerotic heart disease of native coronary artery without angina pectoris: Secondary | ICD-10-CM | POA: Diagnosis present

## 2019-06-07 DIAGNOSIS — Z20828 Contact with and (suspected) exposure to other viral communicable diseases: Secondary | ICD-10-CM | POA: Diagnosis present

## 2019-06-07 DIAGNOSIS — Z7902 Long term (current) use of antithrombotics/antiplatelets: Secondary | ICD-10-CM | POA: Diagnosis not present

## 2019-06-07 DIAGNOSIS — Z981 Arthrodesis status: Secondary | ICD-10-CM

## 2019-06-07 DIAGNOSIS — R072 Precordial pain: Secondary | ICD-10-CM | POA: Diagnosis not present

## 2019-06-07 DIAGNOSIS — E1151 Type 2 diabetes mellitus with diabetic peripheral angiopathy without gangrene: Secondary | ICD-10-CM | POA: Diagnosis present

## 2019-06-07 DIAGNOSIS — I259 Chronic ischemic heart disease, unspecified: Secondary | ICD-10-CM | POA: Diagnosis not present

## 2019-06-07 DIAGNOSIS — I252 Old myocardial infarction: Secondary | ICD-10-CM

## 2019-06-07 DIAGNOSIS — E119 Type 2 diabetes mellitus without complications: Secondary | ICD-10-CM | POA: Diagnosis not present

## 2019-06-07 LAB — CBC
HCT: 39.6 % (ref 39.0–52.0)
Hemoglobin: 14.1 g/dL (ref 13.0–17.0)
MCH: 32.2 pg (ref 26.0–34.0)
MCHC: 35.6 g/dL (ref 30.0–36.0)
MCV: 90.4 fL (ref 80.0–100.0)
Platelets: 279 10*3/uL (ref 150–400)
RBC: 4.38 MIL/uL (ref 4.22–5.81)
RDW: 11.7 % (ref 11.5–15.5)
WBC: 9 10*3/uL (ref 4.0–10.5)
nRBC: 0 % (ref 0.0–0.2)

## 2019-06-07 LAB — BASIC METABOLIC PANEL
Anion gap: 12 (ref 5–15)
BUN: 24 mg/dL — ABNORMAL HIGH (ref 8–23)
CO2: 19 mmol/L — ABNORMAL LOW (ref 22–32)
Calcium: 9.2 mg/dL (ref 8.9–10.3)
Chloride: 104 mmol/L (ref 98–111)
Creatinine, Ser: 1.2 mg/dL (ref 0.61–1.24)
GFR calc Af Amer: >60 mL/min (ref >60)
GFR calc non Af Amer: 57 mL/min — ABNORMAL LOW (ref >60)
Glucose, Bld: 219 mg/dL — ABNORMAL HIGH (ref 70–99)
Potassium: 4.1 mmol/L (ref 3.5–5.1)
Sodium: 135 mmol/L (ref 135–145)

## 2019-06-07 LAB — TROPONIN I (HIGH SENSITIVITY)
Troponin I (High Sensitivity): 34 ng/L — ABNORMAL HIGH (ref <18)
Troponin I (High Sensitivity): 33 ng/L — ABNORMAL HIGH (ref <18)

## 2019-06-07 LAB — CBG MONITORING, ED: Glucose-Capillary: 209 mg/dL — ABNORMAL HIGH (ref 70–99)

## 2019-06-07 MED ORDER — FLUTICASONE PROPIONATE 50 MCG/ACT NA SUSP
2.0000 | Freq: Every day | NASAL | Status: DC | PRN
  Filled 2019-06-07: qty 16

## 2019-06-07 MED ORDER — ACETAMINOPHEN 325 MG PO TABS
650.0000 mg | ORAL_TABLET | ORAL | Status: DC | PRN
  Administered 2019-06-12: 650 mg via ORAL
  Filled 2019-06-07: qty 2

## 2019-06-07 MED ORDER — CARVEDILOL 3.125 MG PO TABS
3.1250 mg | ORAL_TABLET | Freq: Two times a day (BID) | ORAL | Status: DC
  Administered 2019-06-08 – 2019-06-12 (×8): 3.125 mg via ORAL
  Filled 2019-06-07 (×10): qty 1

## 2019-06-07 MED ORDER — FAMOTIDINE IN NACL 20-0.9 MG/50ML-% IV SOLN
20.0000 mg | Freq: Once | INTRAVENOUS | Status: AC
  Administered 2019-06-07: 20 mg via INTRAVENOUS
  Filled 2019-06-07: qty 50

## 2019-06-07 MED ORDER — CLOPIDOGREL BISULFATE 75 MG PO TABS
75.0000 mg | ORAL_TABLET | Freq: Every day | ORAL | Status: DC
  Administered 2019-06-08 – 2019-06-12 (×5): 75 mg via ORAL
  Filled 2019-06-07 (×5): qty 1

## 2019-06-07 MED ORDER — INSULIN GLARGINE 100 UNIT/ML ~~LOC~~ SOLN
10.0000 [IU] | Freq: Every day | SUBCUTANEOUS | Status: DC
  Administered 2019-06-08 – 2019-06-12 (×5): 10 [IU] via SUBCUTANEOUS
  Filled 2019-06-07 (×7): qty 0.1

## 2019-06-07 MED ORDER — INSULIN ASPART 100 UNIT/ML ~~LOC~~ SOLN
0.0000 [IU] | Freq: Three times a day (TID) | SUBCUTANEOUS | Status: DC
  Administered 2019-06-08: 3 [IU] via SUBCUTANEOUS
  Administered 2019-06-09 – 2019-06-10 (×4): 2 [IU] via SUBCUTANEOUS
  Administered 2019-06-11: 3 [IU] via SUBCUTANEOUS
  Administered 2019-06-11 – 2019-06-12 (×3): 1 [IU] via SUBCUTANEOUS

## 2019-06-07 MED ORDER — INSULIN ASPART 100 UNIT/ML ~~LOC~~ SOLN
0.0000 [IU] | Freq: Every day | SUBCUTANEOUS | Status: DC
  Administered 2019-06-07: 2 [IU] via SUBCUTANEOUS

## 2019-06-07 MED ORDER — ASPIRIN 81 MG PO CHEW
324.0000 mg | CHEWABLE_TABLET | Freq: Once | ORAL | Status: DC
  Filled 2019-06-07: qty 4

## 2019-06-07 MED ORDER — ALUM & MAG HYDROXIDE-SIMETH 200-200-20 MG/5ML PO SUSP
15.0000 mL | Freq: Once | ORAL | Status: AC
  Administered 2019-06-07: 15 mL via ORAL
  Filled 2019-06-07: qty 30

## 2019-06-07 MED ORDER — ISOSORBIDE MONONITRATE ER 60 MG PO TB24
120.0000 mg | ORAL_TABLET | Freq: Every day | ORAL | Status: DC
  Administered 2019-06-08 – 2019-06-12 (×5): 120 mg via ORAL
  Filled 2019-06-07 (×5): qty 2

## 2019-06-07 MED ORDER — AMLODIPINE BESYLATE 10 MG PO TABS
10.0000 mg | ORAL_TABLET | Freq: Every day | ORAL | Status: DC
  Administered 2019-06-08 – 2019-06-12 (×5): 10 mg via ORAL
  Filled 2019-06-07 (×5): qty 1

## 2019-06-07 MED ORDER — NITROGLYCERIN 0.4 MG SL SUBL: 0.4000 mg | SUBLINGUAL_TABLET | SUBLINGUAL | Status: DC | PRN

## 2019-06-07 MED ORDER — INSULIN DEGLUDEC 100 UNIT/ML ~~LOC~~ SOLN: 10.0000 [IU] | Freq: Every morning | SUBCUTANEOUS | Status: DC

## 2019-06-07 MED ORDER — LISINOPRIL 20 MG PO TABS
20.0000 mg | ORAL_TABLET | Freq: Every day | ORAL | Status: DC
  Administered 2019-06-08 – 2019-06-12 (×5): 20 mg via ORAL
  Filled 2019-06-07 (×5): qty 1

## 2019-06-07 NOTE — H&P (Signed)
Cardiology Admission History and Physical:   Patient ID: Joseph [name] MRN: [id number]; DOB: [birth year]   Admission date: 06/07/2019  Primary Care Provider: Dema Severin, NP Primary Cardiologist: Kristeen Miss, MD  Primary Electrophysiologist:  None   Chief Complaint:  Chest pain  Patient Profile:   Joseph [name] is a 78 y.o. 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   History of Present Illness:   Mr. Joseph [name] underwent PCI on 05/18/19 after presenting with exertional left sided chest pressure, shortness of breath, and weakness. Culprit lesion felt to be RCA-PDA for which 2 overlapping DES were deployed. He was discharged home on DAPT the following day and continued to feel well - angina free - for 10 days. Thereafter, he began experiencing similar symptoms to prior to his cath that have significantly escalated in frequency and severity in the last two days. He describes these as episodes of profound weakness and SOB associated with sharp left sided CP and shortness of breath. There occurrence is atypical - as the patient was able to ride the stationary bike for 10 minutes this morning without symptoms but then experienced an episode while resting later in the day. He has not had fevers, chills, or cough. Does have chronic sinus congestion. States he felt somewhat lightheaded with his earlier episode but did not syncopize. Has felt nauseous but no vomiting. Has not been diaphoretic.   In the ER, initial EKG SB without ST changes. Initial hsTn 34. CXR unremarkable. Patient continuing to have episodes of discomfort while in ED.   Past Medical History:  Diagnosis Date  . Abdominal discomfort    Abdominal discomfort: EGD 2011 showed no significant abnormalities.  Possibly due to metformin.  . Allergic rhinitis   . Anticoagulated 05/30/2016  . CAD  (coronary artery disease)    a. s/p MI in 2004:  stents to the LAD and staged stent RCA, EF 55%;  b.  NSTEMI (1/16):  LHC - mid LAD stent with diff dist restenosis, prox to mid Dx mod dsz jailed by stent, small OM1 99, mCFX 75, dCFX 80, mRCA stent ok, mPDA mild to mod dsz, EF 55% >> PCI:  Synergy DES to LAD; Synergy DES (x2) mid and dist CFX  . 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  s/p back surgery  . DM (diabetes mellitus) (HCC)   . History of CVA (cerebrovascular accident)    Small CVA seen by head CT (incidental) in 1/13.  Carotid dopplers in 1/13 showed minimal disease  . History of Doppler ultrasound    Arterial dopplers (3/11): no evidence for significant PAD. ABIs 10/12: Normal.    . HLD (hyperlipidemia)   . HTN (hypertension)    had side effects with chlorthalidone  . Hx of cardiovascular stress test    Myoview (1/13): Small fixed apical septal defect with no ischemia, EF 58%.  [name] Hx of echocardiogram    Echo (3/11): EF 60%, normal wall motion, mild MR, mild left atrial enlargement, mildly dilated ascending aorta.  [name] PAF (paroxysmal atrial fibrillation) (HCC) 05/29/2016  . Spondylolisthesis    L4-S1    Past Surgical History:  Procedure Laterality Date  . COLONOSCOPY N/A 12/26/2017   Procedure: COLONOSCOPY;  Surgeon: Benancio Deeds, MD;  Location: [facility] ENDOSCOPY;  Service: Gastroenterology;  Laterality: N/A;  .  COLONOSCOPY W/ POLYPECTOMY  12/2017  . CORONARY ANGIOGRAM  11/13/2014   Procedure: CORONARY ANGIOGRAM;  Surgeon: Larey Dresser, MD;  Location: [facility] CATH LAB;  Service: Cardiovascular;;  . CORONARY STENT INTERVENTION N/A 05/18/2019   Procedure: CORONARY STENT INTERVENTION;  Surgeon: Leonie Man, MD;  Location: [place] CV LAB;  Service: Cardiovascular;  Laterality: N/A;  . CORONARY STENT PLACEMENT  2004   x2 LAD and RCA   . LEFT HEART CATH AND CORONARY ANGIOGRAPHY N/A 05/18/2019   Procedure: LEFT  HEART CATH AND CORONARY ANGIOGRAPHY;  Surgeon: Leonie Man, MD;  Location: [place] CV LAB;  Service: Cardiovascular;  Laterality: N/A;  . LEFT HEART CATHETERIZATION WITH CORONARY ANGIOGRAM N/A 08/02/2014   Procedure: LEFT HEART CATHETERIZATION WITH CORONARY ANGIOGRAM;  Surgeon: Jettie Booze, MD;  Location: [facility] CATH LAB;  Service: Cardiovascular;  Laterality: N/A;  . PROSTATE SURGERY    . SPINAL FUSION     L4-S1     Medications Prior to Admission: Prior to Admission medications   Medication Sig Start Date End Date Taking? Authorizing Provider  acetaminophen (TYLENOL) 650 MG CR tablet Take 1,300 mg by mouth every 8 (eight) hours as needed for pain.    [provider]  amLODipine (NORVASC) 10 MG tablet Take 10 mg by mouth daily. 11/28/17   [provider]  apixaban (ELIQUIS) 5 MG TABS tablet Take 1 tablet (5 mg total) by mouth 2 (two) times daily. DO NOT TAKE FOR 3 MORE DAYS. MAY RESTART TAKING IT ON 01/01/2018. Patient taking differently: Take 5 mg by mouth 2 (two) times daily.  12/28/17   Hongalgi, Lenis Dickinson, MD  aspirin EC 81 MG EC tablet Take 1 tablet (81 mg total) by mouth daily. 05/20/19   Isaiah Serge, NP  carvedilol (COREG) 3.125 MG tablet Take 1 tablet by mouth 2 (two) times daily. 08/09/18   [provider]  clopidogrel (PLAVIX) 75 MG tablet Take 1 tablet (75 mg total) by mouth daily. 05/20/19   Isaiah Serge, NP  Coenzyme Q10 100 MG capsule Take 1 capsule by mouth daily.    [provider]  diltiazem (CARDIZEM) 30 MG tablet Take 1 tablet (30 mg total) by mouth as needed (Rapid Palpitations). 01/31/18   Richardson Dopp T, PA-C  fluticasone (FLONASE) 50 MCG/ACT nasal spray Place 2 sprays into both nostrils daily as needed for allergies or rhinitis.  03/31/15   [provider]  gabapentin (NEURONTIN) 300 MG capsule Take 300 mg by mouth at bedtime as needed (back pain).    [provider]  GARLIC PO Take 1 capsule by mouth 2 (two)  times daily.    [provider]  glipiZIDE (GLUCOTROL) 10 MG tablet Take 10 mg by mouth 2 (two) times daily before a meal.      [provider]  hydroxypropyl methylcellulose / hypromellose (ISOPTO TEARS / GONIOVISC) 2.5 % ophthalmic solution Place 1 drop into both eyes 3 (three) times daily as needed for dry eyes.    [provider]  Insulin Degludec 100 UNIT/ML SOLN Inject 22 Units into the skin every morning.    [provider]  isosorbide mononitrate (IMDUR) 120 MG 24 hr tablet Take 1 tablet (120 mg total) by mouth daily. 05/20/19   Isaiah Serge, NP  lisinopril (PRINIVIL,ZESTRIL) 20 MG tablet Take 1 tablet (20 mg total) by mouth daily. 10/31/17   Richardson Dopp T, PA-C  nitroGLYCERIN (NITROSTAT) 0.4 MG SL tablet Place 0.4 mg under the  tongue every 5 (five) minutes as needed for chest pain (x 3 doses).     [provider]  Omega-3 Fatty Acids (FISH OIL) 1000 MG CAPS Take 4 capsules (4,000 mg total) by mouth daily. 06/03/16   Bhagat, Sharrell Ku, PA  potassium chloride (K-DUR) 10 MEQ tablet Take 1 tablet (10 mEq total) by mouth daily. Patient not taking: Reported on 05/18/2019 11/07/18   Nahser, Deloris Ping, MD  triamcinolone cream (KENALOG) 0.5 % Apply 1 application topically as needed for irritation. 08/24/17   [provider]     Allergies:    Allergies  Allergen Reactions  . Bempedoic Acid Other (See Comments)    NEXLETOL Myalgias, GI upset  . [name] [Alirocumab] Other (See Comments)    Muscle weakness and rash  . Rosuvastatin Other (See Comments)    Low energy/leg and hip pain  . Zetia [Ezetimibe] Other (See Comments)    myalgias    Social History:   Social History   Socioeconomic History  . Marital status: Divorced    Spouse name: Not on file  . Number of children: 2  . Years of education: Not on file  . Highest education level: Not on file  Occupational History  . Occupation: retired  Engineer, production  . Financial resource  strain: Not on file  . Food insecurity    Worry: Not on file    Inability: Not on file  . Transportation needs    Medical: Not on file    Non-medical: Not on file  Tobacco Use  . Smoking status: Never Smoker  . Smokeless tobacco: Never Used  Substance and Sexual Activity  . Alcohol use: No  . Drug use: No  . Sexual activity: Not on file  Lifestyle  . Physical activity    Days per week: Not on file    Minutes per session: Not on file  . Stress: Not on file  Relationships  . Social Musician on phone: Not on file    Gets together: Not on file    Attends religious service: Not on file    Active member of club or organization: Not on file    Attends meetings of clubs or organizations: Not on file    Relationship status: Not on file  . Intimate partner violence    Fear of current or ex partner: Not on file    Emotionally abused: Not on file    Physically abused: Not on file    Forced sexual activity: Not on file  Other Topics Concern  . Not on file  Social History Narrative  . Not on file    Family History:   The patient's family history includes Heart attack in his father and mother. There is no history of Stroke.    Review of Systems: [y] = yes,  = no     General: Weight gain ; Weight loss ; Anorexia ; Fatigue ; Fever ; Chills ; Weakness    Cardiac: Chest pain/pressure Cove.Etienne ]; Resting SOB Cove.Etienne ]; Exertional SOB ; Orthopnea ; Pedal Edema ; Palpitations ; Syncope ; Presyncope ; Paroxysmal nocturnal dyspnea[ ]    Pulmonary: Cough ; Wheezing[ ] ; Hemoptysis[ ] ; Sputum ; Snoring    GI: Vomiting[ ] ; Dysphagia[ ] ; Melena[ ] ; Hematochezia ; Heartburn[ ] ; Abdominal pain ; Constipation ; Diarrhea ; BRBPR    GU: Hematuria[ ] ;  Dysuria ; Nocturia[ ]    Vascular: Pain in legs with walking ; Pain in feet with lying flat ; Non-healing sores ; Stroke ; TIA ; Slurred speech ;   Neuro:  Headaches[ ] ; Vertigo[ ] ; Seizures[ ] ; Paresthesias[ ] ;Blurred vision ; Diplopia ; Vision changes    Ortho/Skin: Arthritis ; Joint pain ; Muscle pain ; Joint swelling ; Back Pain ; Rash    Psych: Depression[ ] ; Anxiety[ ]    Heme: Bleeding problems ; Clotting disorders ; Anemia    Endocrine: Diabetes ; Thyroid dysfunction[ ]   Physical Exam/Data:   Vitals:   06/07/19 2045 06/07/19 2115 06/07/19 2200 06/07/19 2230  BP: (!) 150/73 136/72 (!) 148/84 (!) 146/79  Pulse: (!) 59 (!) 59 (!) 57 (!) 57  Resp: Temp:      TempSrc:      SpO2: 99% 97% 98% 98%  Weight:      Height:       No intake or output data in the 24 hours ending 06/07/19 2240 Filed Weights   06/07/19 1946  Weight: 95.9 kg   Body mass index is 27.14 kg/m.  General:  Well nourished, well developed, in mild distress HEENT: normal Lymph: no adenopathy Neck: no JVD Endocrine:  No thryomegaly Vascular: No carotid bruits; FA pulses 2+ bilaterally without bruits  Cardiac:  Bradycardic S1, S2; RRR; no murmurs Lungs:  clear to auscultation bilaterally, no wheezing, rhonchi or rales  Abd: soft, nontender, no hepatomegaly  Ext: no edema Musculoskeletal:  No deformities, BUE and BLE strength normal and equal. Skin: warm and dry  Neuro:  CNs 2-12 intact, no focal abnormalities noted Psych:  Normal affect   EKG:  The ECG that was done demonstrates sinus bradycardia without ST changes.   Relevant CV Studies: ECHO 05/18/19  IMPRESSIONS   1. Left ventricular ejection fraction, by visual estimation, is 55 to 60%. The left ventricle has normal function. There is moderately increased left ventricular hypertrophy. 2. Entire anterior septum, mid anterior segment, and apex are abnormal. 3. Definity contrast agent was given IV to delineate the left ventricular endocardial borders. 4. Elevated left ventricular end-diastolic pressure. 5. Left ventricular diastolic  parameters are consistent with Grade I diastolic dysfunction (impaired relaxation). 6. The left ventricle demonstrates regional wall motion abnormalities. 7. Global right ventricle has normal systolic function.The right ventricular size is normal. No increase in right ventricular wall thickness. 8. Left atrial size was normal. 9. Right atrial size was normal. 10. The mitral valve is normal in structure. No evidence of mitral valve regurgitation. No evidence of mitral stenosis. 11. The tricuspid valve is normal in structure. Tricuspid valve regurgitation is not demonstrated. 12. The aortic valve is normal in structure. Aortic valve regurgitation is not visualized. No evidence of aortic valve sclerosis or stenosis. 13. The pulmonic valve was normal in structure. Pulmonic valve regurgitation is not visualized. 14. The inferior vena cava is normal in size with greater than 50% respiratory variability, suggesting right atrial pressure of 3 mmHg. 15. Hypokinesis consistent with ischemia in the LAD territory, overall LVEF is preserved because other walls are hyperdynamic.  FINDINGS Left Ventricle: Left ventricular ejection fraction, by visual estimation, is 55 to 60%. The left ventricle has normal function. Definity contrast agent was given IV to delineate the left ventricular endocardial borders. The left ventricle demonstrates  regional wall motion abnormalities. There is  moderately increased left ventricular hypertrophy. Concentric left ventricular hypertrophy. Left ventricular diastolic parameters are consistent with Grade I diastolic dysfunction (impaired relaxation).  Elevated left ventricular end-diastolic pressure.   LV Wall Scoring: The entire anterior septum, mid anterior segment, and apex are hypokinetic.  Right Ventricle: The right ventricular size is normal. No increase in right ventricular wall thickness. Global RV systolic function is has normal systolic function.  Left  Atrium: Left atrial size was normal in size.  Right Atrium: Right atrial size was normal in size  Pericardium: There is no evidence of pericardial effusion.  Mitral Valve: The mitral valve is normal in structure. No evidence of mitral valve stenosis by observation. No evidence of mitral valve regurgitation.  Tricuspid Valve: The tricuspid valve is normal in structure. Tricuspid valve regurgitation is not demonstrated.  Aortic Valve: The aortic valve is normal in structure. Aortic valve regurgitation is not visualized. The aortic valve is structurally normal, with no evidence of sclerosis or stenosis.  Pulmonic Valve: The pulmonic valve was normal in structure. Pulmonic valve regurgitation is not visualized.  Aorta: The aortic root, ascending aorta and aortic arch are all structurally normal, with no evidence of dilitation or obstruction.  Venous: The inferior vena cava is normal in size with greater than 50% respiratory variability, suggesting right atrial pressure of 3 mmHg.  IAS/Shunts: No atrial level shunt detected by color flow Doppler. No ventricular septal defect is seen or detected. There is no evidence of an atrial septal defect.   Cardiac cath and PCI 05/07/29   CULPRIT LESIONS: Dist RCA lesion is 90% stenosed with 90% stenosed side branch in RPDA. followed by RPDA lesion is 70% stenosed.  A drug-eluting stent was successfully placed covering the more distal segment in the PDA using a STENT RESOLUTE ONYX 2.5X22. Postdilated 2.6 mm  A second drug-eluting stent was successfully placed covering the proximal lesion into the distal RCA in overlapping the PDA stent, using a STENT RESOLUTE ONYX 2.5X22. Postdilated to 2.9 mm  2 Overlapping stents used b/c unable to advance a 38 mm stent due to tortuous RCA & lack of Guide support.  Post intervention, there is a 0% residual stenosis.  Post intervention, the side branch was reduced to 0% residual stenosis.   -------------------------------------------------------------  Prox RCA to Mid RCA previously placed stents are ~5% stenosed.  Prox LAD to Mid LAD lesion is 30% stenosed. This is followed by mid LAD to [facility] LAD overlapping stents with 30% in-stent restenosis and with stable (from 2016) 70% ostial-proximal & 75% mid stenosis in side branch in 2nd Diag.  2nd Mrg lesion is 75% stenosed -- Not PCI target (stable from 2016)  -------------------------------------------------------------  There is mild left ventricular systolic dysfunction. The left ventricular ejection fraction is 45-50% by visual estimate. Mid anterior-apical anterior hypokinesis. LV end diastolic pressure is normal.  SUMMARY  Multivessel CAD involving RCA-PDA, LAD &1st Diag as well as 1st Mrg. -->Stable disease in the small side branches with mild LAD ISR and widely patent RCA stents.  CULPRIT LESION: Distal RCA-PDA 90% followed a 70% stenosis ? successfully treated with 2 overlapping Resolute Onyx DES stents (2.5 mm x 22 mm x 2 -postdilated from 2.9-2.6 mm)  Codominant system with relatively small caliber LAD and very large circumflex with LPLOM branch and RCA basically giving the RPDA that is a wraparound PDA covering the distal anterior apex  Mildly reduced LVEF with anterior hypokinesis -EF roughly 45 to 50%.  Normal LVEDP   RECOMMENDATIONS  Patient will  return to nursing unit after procedure for TR band removal. Anticipate discharge in the morning.  Was bolused with Brilinta here in the Cath Lab, will give 1 more dose of Brilinta tonight and convert to catheter goal in the morning 10/31 ->300 mg clopidogrel written for (ensure that this is given, would then take 75 mg daily going forward   Continue aggressive risk factor modification  Follow-up with Dr. Elease HashimotoNahser  Laboratory Data:  Chemistry Recent Labs  Lab 06/07/19 1947  NA 135  K 4.1  CL 104  CO2 19*  GLUCOSE 219*  BUN 24*  CREATININE 1.20   CALCIUM 9.2  GFRNONAA 57*  GFRAA >60  ANIONGAP 12    No results for input(s): PROT, ALBUMIN, AST, ALT, ALKPHOS, BILITOT in the last 168 hours. Hematology Recent Labs  Lab 06/07/19 1947  WBC 9.0  RBC 4.38  HGB 14.1  HCT 39.6  MCV 90.4  MCH 32.2  MCHC 35.6  RDW 11.7  PLT 279   hsTn 34 ->   Radiology/Studies:  Dg Chest Portable 1 View  Result Date: 06/07/2019 CLINICAL DATA:  Chest pain EXAM: PORTABLE CHEST 1 VIEW COMPARISON:  05/17/2019 FINDINGS: Heart and mediastinal contours are within normal limits. No focal opacities or effusions. No acute bony abnormality. IMPRESSION: No active disease. Electronically Signed   By: Charlett NoseKevin  Dover M.D.   On: 06/07/2019 20:03    Assessment and Plan:   Joseph [name] is an 78 year old man with known multivessel CAD s/p recent PCI who presents with recrudescence of weakness, chest discomfort, and shortness of breath similar to prior to his recent PCI. hsTn with mild elevation but flat. Episodes, generally, seem atypical but certainly warrant further investigation in this gentleman with multivessel disease and recent PCI.   #Unstable Angina #Multivessel CAD s/p recent PCI -- Will plan to continue DAPT -- NPO @ [facility] for possible LHC tomorrow -- Chest wall echo; LVEF 55% in 04/2019 -- Trial SLN v. IV nitroglycerin if pain returns.  -- Trial GI cocktail.  -- Continue low dose coreg, lisinopril 20mg   #Angina -- On amlodipine 10mg  and Imdur 120 q24 -- May need to consider uptitration if no clear culprit angiographically.   #pAF -- On eliquis; last dose this evening.  -- Hold and will plan for heparin bridge in the AM (CHADS-2-Vasc 7)  #DM2 -- Hold basal coverage as will be NPO @ [facility]. -- ISS qAC, qHS  Severity of Illness: The appropriate patient status for this patient is INPATIENT. Inpatient status is judged to be reasonable and necessary in order to provide the required intensity of service to ensure the patient's safety. The patient's  presenting symptoms, physical exam findings, and initial radiographic and laboratory data in the context of their chronic comorbidities is felt to place them at high risk for further clinical deterioration. Furthermore, it is not anticipated that the patient will be medically stable for discharge from the hospital within 2 midnights of admission. The following factors support the patient status of inpatient.   " The patient's presenting symptoms include chest pain " The worrisome physical exam findings include n/a " The initial radiographic and laboratory data are worrisome because of elevated hsTn. " The chronic co-morbidities include HTN, DM2, Cad.    * I certify that at the point of admission it is my clinical judgment that the patient will require inpatient hospital care spanning beyond 2 midnights from the point of admission due to high intensity of service, high risk for further deterioration  and high frequency of surveillance required.*    For questions or updates, please contact [facility] Please consult [email or web address] for contact info under    Signed, Laurell Josephs, MD  06/07/2019 10:40 PM

## 2019-06-07 NOTE — ED Provider Notes (Signed)
[facility]   CSN: [id number] Arrival date & time: 06/07/19  1938     History   Chief Complaint Chief Complaint  Patient presents with  . Chest Pain    HPI Joseph [name] is a 78 y.o. male.     Patient with history of CAD s/p multiple stents with 2 stents placed as recently as 2 weeks ago presenting with intermittent chest pain for the past 5 days.  States he developed pain and tightness in his chest about 5 days ago that comes and goes lasting for several minutes to hours at a time.  He has been using nitroglycerin at home with partial relief.  The pain does not radiate.  Is associated with severe nausea with no vomiting.  No shortness of breath, cough, fever, abdominal pain or back pain.  Patient had similar pain prior to his stent placement at the end of October and states he did well initially but now the pain is returned.  He describes the pain as a tightness and pain in the center of his chest that feels like a pressure.  There is no radiation of the pain.  Is worse with exertion and better with rest.  He was given nitroglycerin before EMS arrival which helped initially but now his pain is starting to return.  The history is provided by the patient and the EMS personnel.  Chest Pain Associated symptoms: nausea and shortness of breath   Associated symptoms: no abdominal pain, no fever, no headache, no vomiting and no weakness     Past Medical History:  Diagnosis Date  . Abdominal discomfort    Abdominal discomfort: EGD 2011 showed no significant abnormalities.  Possibly due to metformin.  . Allergic rhinitis   . Anticoagulated 05/30/2016  . CAD (coronary artery disease)    a. s/p MI in 2004:  stents to the LAD and staged stent RCA, EF 55%;  b.  NSTEMI (1/16):  LHC - mid LAD stent with diff dist restenosis, prox to mid Dx mod dsz jailed by stent, small OM1 99, mCFX 75, dCFX 80, mRCA stent ok, mPDA mild to mod dsz, EF 55%  >> PCI:  Synergy DES to LAD; Synergy DES (x2) mid and dist CFX  . 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  s/p back surgery  . DM (diabetes mellitus) (HCC)   . History of CVA (cerebrovascular accident)    Small CVA seen by head CT (incidental) in 1/13.  Carotid dopplers in 1/13 showed minimal disease  . History of Doppler ultrasound    Arterial dopplers (3/11): no evidence for significant PAD. ABIs 10/12: Normal.    . HLD (hyperlipidemia)   . HTN (hypertension)    had side effects with chlorthalidone  . Hx of cardiovascular stress test    Myoview (1/13): Small fixed apical septal defect with no ischemia, EF 58%.  [name]. Hx of echocardiogram    Echo (3/11): EF 60%, normal wall motion, mild MR, mild left atrial enlargement, mildly dilated ascending aorta.  [name]. PAF (paroxysmal atrial fibrillation) (HCC) 05/29/2016  . Spondylolisthesis    L4-S1    Patient Active Problem List   Diagnosis Date Noted  . Unstable angina (HCC) 05/17/2019  . Lower GI bleed 12/26/2017  . Hypotension 12/26/2017  . Postural dizziness with presyncope 12/26/2017  . Fatigue 09/22/2017  . Coronary artery disease of native artery of native heart with stable angina  pectoris (HCC) 04/11/2017  . Hyperlipidemia 04/11/2017  . Anticoagulated 05/30/2016  . Paroxysmal atrial fibrillation (HCC) 05/29/2016  . Exertional angina (HCC) 08/02/2014  . Bradycardia 02/02/2012  . CVA (cerebral infarction) 08/01/2011  . Leg pain 03/31/2011  . Abdominal pain 10/13/2010  . Hip pain 10/13/2010  . Pure hypercholesterolemia 09/09/2009  . Essential hypertension 09/09/2009  . Peripheral vascular disease (HCC) 09/09/2009  . 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  . CAD S/P percutaneous coronary angioplasty 09/08/2009    Past Surgical History:  Procedure Laterality Date  . COLONOSCOPY N/A 12/26/2017   Procedure: COLONOSCOPY;  Surgeon: Benancio Deeds, MD;  Location: [facility]OSCOPY;  Service: Gastroenterology;  Laterality: N/A;  . COLONOSCOPY W/ POLYPECTOMY  12/2017  . CORONARY ANGIOGRAM  11/13/2014   Procedure: CORONARY ANGIOGRAM;  Surgeon: Laurey Morale, MD;  Location: [facility] CATH LAB;  Service: Cardiovascular;;  . CORONARY STENT INTERVENTION N/A 05/18/2019   Procedure: CORONARY STENT INTERVENTION;  Surgeon: Marykay Lex, MD;  Location: [facility] INVASIVE CV LAB;  Service: Cardiovascular;  Laterality: N/A;  . CORONARY STENT PLACEMENT  2004   x2 LAD and RCA   . LEFT HEART CATH AND CORONARY ANGIOGRAPHY N/A 05/18/2019   Procedure: LEFT HEART CATH AND CORONARY ANGIOGRAPHY;  Surgeon: Marykay Lex, MD;  Location: [facility] INVASIVE CV LAB;  Service: Cardiovascular;  Laterality: N/A;  . LEFT HEART CATHETERIZATION WITH CORONARY ANGIOGRAM N/A 08/02/2014   Procedure: LEFT HEART CATHETERIZATION WITH CORONARY ANGIOGRAM;  Surgeon: Corky Crafts, MD;  Location: [facility] CATH LAB;  Service: Cardiovascular;  Laterality: N/A;  . PROSTATE SURGERY    . SPINAL FUSION     L4-S1        Home Medications    Prior to Admission medications   Medication Sig Start Date End Date Taking? Authorizing Provider  acetaminophen (TYLENOL) 650 MG CR tablet Take 1,300 mg by mouth every 8 (eight) hours as needed for pain.    [provider]  amLODipine (NORVASC) 10 MG tablet Take 10 mg by mouth daily. 11/28/17   [provider]  apixaban (ELIQUIS) 5 MG TABS tablet Take 1 tablet (5 mg total) by mouth 2 (two) times daily. DO NOT TAKE FOR 3 MORE DAYS. MAY RESTART TAKING IT ON 01/01/2018. Patient taking differently: Take 5 mg by mouth 2 (two) times daily.  12/28/17   Hongalgi, Maximino Greenland, MD  aspirin EC 81 MG EC tablet Take 1 tablet (81 mg total) by mouth daily. 05/20/19   Leone Brand, NP  carvedilol (COREG) 3.125 MG tablet Take 1 tablet by mouth 2 (two) times daily. 08/09/18   [provider]  clopidogrel (PLAVIX) 75 MG tablet Take 1 tablet (75 mg total) by  mouth daily. 05/20/19   Leone Brand, NP  Coenzyme Q10 100 MG capsule Take 1 capsule by mouth daily.    [provider]  diltiazem (CARDIZEM) 30 MG tablet Take 1 tablet (30 mg total) by mouth as needed (Rapid Palpitations). 01/31/18   Tereso Newcomer T, PA-C  fluticasone (FLONASE) 50 MCG/ACT nasal spray Place 2 sprays into both nostrils daily as needed for allergies or rhinitis.  03/31/15   [provider]  gabapentin (NEURONTIN) 300 MG capsule Take 300 mg by mouth at bedtime as needed (back pain).    [provider]  GARLIC PO Take 1 capsule by mouth 2 (two) times daily.    [provider]  glipiZIDE (GLUCOTROL) 10 MG tablet Take 10 mg by mouth 2 (two)  times daily before a meal.      [provider]  hydrochlorothiazide (HYDRODIURIL) 25 MG tablet Take 1 tablet (25 mg total) by mouth daily. 11/07/18   Nahser, Wonda Cheng, MD  hydroxypropyl methylcellulose / hypromellose (ISOPTO TEARS / GONIOVISC) 2.5 % ophthalmic solution Place 1 drop into both eyes 3 (three) times daily as needed for dry eyes.    [provider]  Insulin Degludec 100 UNIT/ML SOLN Inject 22 Units into the skin every morning.    [provider]  isosorbide mononitrate (IMDUR) 120 MG 24 hr tablet Take 1 tablet (120 mg total) by mouth daily. 05/20/19   Isaiah Serge, NP  lisinopril (PRINIVIL,ZESTRIL) 20 MG tablet Take 1 tablet (20 mg total) by mouth daily. 10/31/17   Richardson Dopp T, PA-C  nitroGLYCERIN (NITROSTAT) 0.4 MG SL tablet Place 0.4 mg under the tongue every 5 (five) minutes as needed for chest pain (x 3 doses).     [provider]  Omega-3 Fatty Acids (FISH OIL) 1000 MG CAPS Take 4 capsules (4,000 mg total) by mouth daily. 06/03/16   Bhagat, Crista Luria, PA  potassium chloride (K-DUR) 10 MEQ tablet Take 1 tablet (10 mEq total) by mouth daily. Patient not taking: Reported on 05/18/2019 11/07/18   Nahser, Wonda Cheng, MD  triamcinolone cream (KENALOG) 0.5 % Apply 1  application topically as needed for irritation. 08/24/17   [provider]    Family History Family History  Problem Relation Age of Onset  . Heart attack Mother   . Heart attack Father   . Stroke Neg Hx     Social History Social History   Tobacco Use  . Smoking status: Never Smoker  . Smokeless tobacco: Never Used  Substance Use Topics  . Alcohol use: No  . Drug use: No     Allergies   Bempedoic acid, [name] [alirocumab], Rosuvastatin, and Zetia [ezetimibe]   Review of Systems Review of Systems  Constitutional: Negative for activity change, appetite change and fever.  HENT: Negative for congestion and rhinorrhea.   Eyes: Negative for visual disturbance.  Respiratory: Positive for chest tightness and shortness of breath.   Cardiovascular: Positive for chest pain. Negative for leg swelling.  Gastrointestinal: Positive for nausea. Negative for abdominal pain and vomiting.  Genitourinary: Negative for dysuria and hematuria.  Musculoskeletal: Negative for arthralgias and myalgias.  Skin: Negative for rash.  Neurological: Negative for weakness and headaches.   all other systems are negative except as noted in the HPI and PMH.     Physical Exam Updated Vital Signs BP (!) 160/77   Pulse 69   Temp 98.5 F (36.9 C) (Oral)   Resp 18   Ht 6\' 2"  (1.88 m)   Wt 95.9 kg   SpO2 98%   BMI 27.14 kg/m   Physical Exam Vitals signs and nursing note reviewed.  Constitutional:      General: He is not in acute distress.    Appearance: He is well-developed. He is obese.  HENT:     Head: Normocephalic and atraumatic.     Mouth/Throat:     Pharynx: No oropharyngeal exudate.  Eyes:     Conjunctiva/sclera: Conjunctivae normal.     Pupils: Pupils are equal, round, and reactive to light.  Neck:     Musculoskeletal: Normal range of motion and neck supple.     Comments: No meningismus. Cardiovascular:     Rate and Rhythm: Normal rate and regular rhythm.     Heart  sounds: Normal  heart sounds. No murmur.  Pulmonary:     Effort: Pulmonary effort is normal. No respiratory distress.     Breath sounds: Normal breath sounds.  Chest:     Chest wall: No tenderness.  Abdominal:     Palpations: Abdomen is soft.     Tenderness: There is no abdominal tenderness. There is no guarding or rebound.  Musculoskeletal: Normal range of motion.        General: No tenderness.  Skin:    General: Skin is warm.     Capillary Refill: Capillary refill takes less than 2 seconds.  Neurological:     General: No focal deficit present.     Mental Status: He is alert and oriented to person, place, and time. Mental status is at baseline.     Cranial Nerves: No cranial nerve deficit.     Motor: No abnormal muscle tone.     Coordination: Coordination normal.     Comments: No ataxia on finger to nose bilaterally. No pronator drift. 5/5 strength throughout. CN 2-12 intact.Equal grip strength. Sensation intact.   Psychiatric:        Behavior: Behavior normal.      ED Treatments / Results  Labs (all labs ordered are listed, but only abnormal results are displayed) Labs Reviewed  BASIC METABOLIC PANEL - Abnormal; Notable for the following components:      Result Value   CO2 19 (*)    Glucose, Bld 219 (*)    BUN 24 (*)    GFR calc non Af Amer 57 (*)    All other components within normal limits  CBG MONITORING, ED - Abnormal; Notable for the following components:   Glucose-Capillary 209 (*)    All other components within normal limits  TROPONIN I (HIGH SENSITIVITY) - Abnormal; Notable for the following components:   Troponin I (High Sensitivity) 34 (*)    All other components within normal limits  TROPONIN I (HIGH SENSITIVITY) - Abnormal; Notable for the following components:   Troponin I (High Sensitivity) 33 (*)    All other components within normal limits  SARS CORONAVIRUS 2 (TAT 6-24 HRS)  CBC    EKG EKG Interpretation  Date/Time:  Thursday June 07 2019  19:44:32 EST Ventricular Rate:  68 PR Interval:    QRS Duration: 103 QT Interval:  408 QTC Calculation: 434 R Axis:   -31 Text Interpretation: Sinus rhythm Left axis deviation Probable anteroseptal infarct, old No significant change was found Confirmed by Glynn Octave [phone]) on 06/07/2019 7:48:31 PM   Radiology Dg Chest Portable 1 View  Result Date: 06/07/2019 CLINICAL DATA:  Chest pain EXAM: PORTABLE CHEST 1 VIEW COMPARISON:  05/17/2019 FINDINGS: Heart and mediastinal contours are within normal limits. No focal opacities or effusions. No acute bony abnormality. IMPRESSION: No active disease. Electronically Signed   By: Charlett Nose M.D.   On: 06/07/2019 20:03    Procedures .Critical Care Performed by: Glynn Octave, MD Authorized by: Glynn Octave, MD   Critical care provider statement:    Critical care time (minutes):  35   Critical care was time spent personally by me on the following activities:  Discussions with consultants, evaluation of patient's response to treatment, examination of patient, ordering and performing treatments and interventions, ordering and review of laboratory studies, ordering and review of radiographic studies, pulse oximetry, re-evaluation of patient's condition, obtaining history from patient or surrogate and review of old charts   (including critical care time)  Medications Ordered in ED Medications  aspirin chewable tablet 324 mg (has no administration in time range)     Initial Impression / Assessment and Plan / ED Course  I have reviewed the triage vital signs and the nursing notes.  Pertinent labs & imaging results that were available during my care of the patient were reviewed by me and considered in my medical decision making (see chart for details).       Intermittent chest pain for the past several days concerning for angina.  EKG shows sinus rhythm with unchanged septal infarct.  Patient given aspirin and nitroglycerin by  EMS. Recent catheterization report reviewed  05/18/19 SUMMARY  Multivessel CAD involving RCA-PDA, LAD & 1st Diag as well as 1st Mrg. -->  Stable disease in the small side branches with mild LAD ISR and widely patent RCA stents.  CULPRIT LESION: Distal RCA-PDA 90% followed a 70% stenosis ? successfully treated with 2 overlapping Resolute Onyx DES stents (2.5 mm x 22 mm x 2 -postdilated from 2.9-2.6 mm)  Codominant system with relatively small caliber LAD and very large circumflex with LPLOM branch and RCA basically giving the RPDA that is a wraparound PDA covering the distal anterior apex  Mildly reduced LVEF with anterior hypokinesis -EF roughly 45 to 50%.  Normal LVEDP  Patient with slight response to nitroglycerin.  Troponin minimally elevated without much change from previous.  His story is very concerning for unstable angina.  He received aspirin as well as nitrates. Low suspicion for PE or aortic dissection.  Case discussed with on-call cardiology Dr. Charna Busman who evaluated patient and will admit for observation and possible catheterization in the morning.  Heparin not ordered due to Eliquis use. Final Clinical Impressions(s) / ED Diagnoses   Final diagnoses:  Unstable angina [facility])    ED Discharge Orders    None       Glynn Octave, MD 06/08/19 [phone]

## 2019-06-07 NOTE — ED Triage Notes (Signed)
Pt. Via [facility] from home c/o CP.   CP Intermittent since Saturday, worsen today around 5:30 pm 5 out of 10 pain with dizziness, lightheadedness, weakness, and sweating. Pt. States resting helps relieve pain. HX 2 stents two weeks ago.    2 SL NTG with complete relief of pain, 325 aspirin   Currently present with dull pain left chest rating 2 out of 10. No radiation. No other symptoms.   EMS VS HR 74 NSR SpO2 98  BP 171/89 RR 18  HX DM. HTN, and CAD

## 2019-06-08 ENCOUNTER — Inpatient Hospital Stay (HOSPITAL_COMMUNITY): Payer: Medicare Other

## 2019-06-08 ENCOUNTER — Encounter (HOSPITAL_COMMUNITY): Payer: Self-pay | Admitting: General Practice

## 2019-06-08 DIAGNOSIS — I10 Essential (primary) hypertension: Secondary | ICD-10-CM

## 2019-06-08 DIAGNOSIS — R079 Chest pain, unspecified: Secondary | ICD-10-CM

## 2019-06-08 DIAGNOSIS — I200 Unstable angina: Secondary | ICD-10-CM

## 2019-06-08 LAB — BASIC METABOLIC PANEL
Anion gap: 10 (ref 5–15)
BUN: 21 mg/dL (ref 8–23)
CO2: 24 mmol/L (ref 22–32)
Calcium: 9.4 mg/dL (ref 8.9–10.3)
Chloride: 106 mmol/L (ref 98–111)
Creatinine, Ser: 1.13 mg/dL (ref 0.61–1.24)
GFR calc Af Amer: >60 mL/min (ref >60)
GFR calc non Af Amer: >60 mL/min (ref >60)
Glucose, Bld: 161 mg/dL — ABNORMAL HIGH (ref 70–99)
Potassium: 3.7 mmol/L (ref 3.5–5.1)
Sodium: 140 mmol/L (ref 135–145)

## 2019-06-08 LAB — ECHOCARDIOGRAM COMPLETE
Height: 74 in
Weight: 3324.8 oz

## 2019-06-08 LAB — GLUCOSE, CAPILLARY
Glucose-Capillary: 114 mg/dL — ABNORMAL HIGH (ref 70–99)
Glucose-Capillary: 115 mg/dL — ABNORMAL HIGH (ref 70–99)
Glucose-Capillary: 214 mg/dL — ABNORMAL HIGH (ref 70–99)

## 2019-06-08 LAB — SARS CORONAVIRUS 2 (TAT 6-24 HRS): SARS Coronavirus 2: NEGATIVE

## 2019-06-08 LAB — APTT: aPTT: 56 seconds — ABNORMAL HIGH (ref 24–36)

## 2019-06-08 LAB — HEPARIN LEVEL (UNFRACTIONATED): Heparin Unfractionated: 1.3 IU/mL — ABNORMAL HIGH (ref 0.30–0.70)

## 2019-06-08 MED ORDER — ASPIRIN EC 81 MG PO TBEC
81.0000 mg | DELAYED_RELEASE_TABLET | Freq: Every day | ORAL | Status: DC
  Administered 2019-06-08 – 2019-06-12 (×4): 81 mg via ORAL
  Filled 2019-06-08 (×4): qty 1

## 2019-06-08 MED ORDER — SODIUM CHLORIDE 0.9% FLUSH
3.0000 mL | Freq: Two times a day (BID) | INTRAVENOUS | Status: DC
  Administered 2019-06-09 – 2019-06-12 (×6): 3 mL via INTRAVENOUS

## 2019-06-08 MED ORDER — HEPARIN (PORCINE) 25000 UT/250ML-% IV SOLN
1250.0000 [IU]/h | INTRAVENOUS | Status: DC
  Administered 2019-06-08: 1300 [IU]/h via INTRAVENOUS
  Administered 2019-06-09: 1500 [IU]/h via INTRAVENOUS
  Administered 2019-06-10: 1250 [IU]/h via INTRAVENOUS
  Filled 2019-06-08 (×4): qty 250

## 2019-06-08 MED ORDER — NITROGLYCERIN 0.4 MG SL SUBL: 0.4000 mg | SUBLINGUAL_TABLET | SUBLINGUAL | Status: DC | PRN

## 2019-06-08 NOTE — Progress Notes (Signed)
ANTICOAGULATION CONSULT NOTE  Pharmacy Consult:  Heparin Indication:  [place] and history of Afib  Allergies  Allergen Reactions  . Bempedoic Acid Other (See Comments)    NEXLETOL Myalgias, GI upset  . [name] [Alirocumab] Other (See Comments)    Muscle weakness and rash  . Rosuvastatin Other (See Comments)    Low energy/leg and hip pain  . Zetia [Ezetimibe] Other (See Comments)    myalgias    Patient Measurements: Height: 6\' 2"  (188 cm) Weight: 207 lb 12.8 oz (94.3 kg) IBW/kg (Calculated) : 82.2 Heparin Dosing Weight: 94 kg  Vital Signs: Temp: 97.8 F (36.6 C) (11/20 2021) Temp Source: Oral (11/20 2021) BP: 134/74 (11/20 2021) Pulse Rate: 65 (11/20 2229)  Labs: Recent Labs    06/07/19 1947 06/07/19 2147 06/08/19 0300 06/08/19 2049  HGB 14.1  --   --   --   HCT 39.6  --   --   --   PLT 279  --   --   --   APTT  --   --   --  56*  HEPARINUNFRC  --   --   --  1.30*  CREATININE 1.20  --  1.13  --   TROPONINIHS 34* 33*  --   --     Estimated Creatinine Clearance: 60.6 mL/min (by C-G formula based on SCr of 1.13 mg/dL).   Medical History: Past Medical History:  Diagnosis Date  . Abdominal discomfort    Abdominal discomfort: EGD 2011 showed no significant abnormalities.  Possibly due to metformin.  . Allergic rhinitis   . Anticoagulated 05/30/2016  . CAD (coronary artery disease)    a. s/p MI in 2004:  stents to the LAD and staged stent RCA, EF 55%;  b.  NSTEMI (1/16):  LHC - mid LAD stent with diff dist restenosis, prox to mid Dx mod dsz jailed by stent, small OM1 99, mCFX 75, dCFX 80, mRCA stent ok, mPDA mild to mod dsz, EF 55% >> PCI:  Synergy DES to LAD; Synergy DES (x2) mid and dist CFX  . 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  s/p back surgery  . DM (diabetes mellitus) ([place])   . History of CVA (cerebrovascular accident)    Small CVA seen by head CT (incidental) in 1/13.  Carotid dopplers in  1/13 showed minimal disease  . History of Doppler ultrasound    Arterial dopplers (3/11): no evidence for significant PAD. ABIs 10/12: Normal.    . HLD (hyperlipidemia)   . HTN (hypertension)    had side effects with chlorthalidone  . Hx of cardiovascular stress test    Myoview (1/13): Small fixed apical septal defect with no ischemia, EF 58%.  [name] Hx of echocardiogram    Echo (3/11): EF 60%, normal wall motion, mild MR, mild left atrial enlargement, mildly dilated ascending aorta.  [name] PAF (paroxysmal atrial fibrillation) ([place]) 05/29/2016  . Spondylolisthesis    L4-S1     Assessment: 78 YOM presented with chest pain.  Patient has a history of Afib on Eliquis PTA, last dose 06/07/19 PM per documentation.  Pharmacy consulted to dose heparin.   -heparin level= 1.3 (influence of apixaban) -aPTT= 56  Goal of Therapy:  Heparin level 0.3-0.7 units/ml aPTT 66 - 102 seconds Monitor platelets by anticoagulation protocol: Yes   Plan:  Increase heparin to 1500 units/hr Check 8 hr aPTT Daily heparin level, aPTT and CBC  Hildred Laser, PharmD Clinical  Pharmacist **Pharmacist phone directory can now be found on [email or web address] ([email or web address]).  Listed under [facility].

## 2019-06-08 NOTE — Procedures (Signed)
Echo attempted. Patient wanted to eat lunch that had just been delivered. Will attempt again.

## 2019-06-08 NOTE — ED Notes (Signed)
ED TO INPATIENT HANDOFF REPORT  ED Nurse Name and Phone #: [phone]  S Name/Age/Gender Joseph [name] 78 y.o. male Room/Bed: 019C/019C  Code Status   Code Status: Full Code  Home/SNF/Other Home Patient oriented to: self, place, time and situation Is this baseline? Yes   Triage Complete: Triage complete  Chief Complaint CP  Triage Note Pt. Via [facility] from home c/o CP.   CP Intermittent since Saturday, worsen today around 5:30 pm 5 out of 10 pain with dizziness, lightheadedness, weakness, and sweating. Pt. States resting helps relieve pain. HX 2 stents two weeks ago.    2 SL NTG with complete relief of pain, 325 aspirin   Currently present with dull pain left chest rating 2 out of 10. No radiation. No other symptoms.   EMS VS HR 74 NSR SpO2 98  BP 171/89 RR 18  HX DM. HTN, and CAD    Allergies Allergies  Allergen Reactions  . Bempedoic Acid Other (See Comments)    NEXLETOL Myalgias, GI upset  . [name] [Alirocumab] Other (See Comments)    Muscle weakness and rash  . Rosuvastatin Other (See Comments)    Low energy/leg and hip pain  . Zetia [Ezetimibe] Other (See Comments)    myalgias    Level of Care/Admitting Diagnosis ED Disposition    ED Disposition Condition Comment   Admit  Hospital Area: [facility] [[id number]]  Level of Care: Progressive [102]  Admit to Progressive based on following criteria: CARDIOVASCULAR & THORACIC of moderate stability with acute coronary syndrome symptoms/low risk myocardial infarction/hypertensive urgency/arrhythmias/heart failure potentially compromising stability and stable post cardiovascular intervention patients.  Covid Evaluation: Asymptomatic Screening Protocol (No Symptoms)  Diagnosis: Chest pain [626948]  Admitting Physician: Milus Banister [[id number]]  Attending Physician: Thayer Headings [phone]  Estimated length of stay: past midnight tomorrow  Certification:: I certify this patient will  need inpatient services for at least 2 midnights  PT Class (Do Not Modify): Inpatient [101]  PT Acc Code (Do Not Modify): Private [1]       B Medical/Surgery History Past Medical History:  Diagnosis Date  . Abdominal discomfort    Abdominal discomfort: EGD 2011 showed no significant abnormalities.  Possibly due to metformin.  . Allergic rhinitis   . Anticoagulated 05/30/2016  . CAD (coronary artery disease)    a. s/p MI in 2004:  stents to the LAD and staged stent RCA, EF 55%;  b.  NSTEMI (1/16):  LHC - mid LAD stent with diff dist restenosis, prox to mid Dx mod dsz jailed by stent, small OM1 99, mCFX 75, dCFX 80, mRCA stent ok, mPDA mild to mod dsz, EF 55% >> PCI:  Synergy DES to LAD; Synergy DES (x2) mid and dist CFX  . 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  s/p back surgery  . DM (diabetes mellitus) ([place])   . History of CVA (cerebrovascular accident)    Small CVA seen by head CT (incidental) in 1/13.  Carotid dopplers in 1/13 showed minimal disease  . History of Doppler ultrasound    Arterial dopplers (3/11): no evidence for significant PAD. ABIs 10/12: Normal.    . HLD (hyperlipidemia)   . HTN (hypertension)    had side effects with chlorthalidone  . Hx of cardiovascular stress test    Myoview (1/13): Small fixed apical septal defect with no ischemia, EF 58%.  [name] Hx of echocardiogram  Echo (3/11): EF 60%, normal wall motion, mild MR, mild left atrial enlargement, mildly dilated ascending aorta.  [name] PAF (paroxysmal atrial fibrillation) (HCC) 05/29/2016  . Spondylolisthesis    L4-S1   Past Surgical History:  Procedure Laterality Date  . COLONOSCOPY N/A 12/26/2017   Procedure: COLONOSCOPY;  Surgeon: Benancio Deeds, MD;  Location: [facility] ENDOSCOPY;  Service: Gastroenterology;  Laterality: N/A;  . COLONOSCOPY W/ POLYPECTOMY  12/2017  . CORONARY ANGIOGRAM  11/13/2014   Procedure: CORONARY ANGIOGRAM;  Surgeon:  Laurey Morale, MD;  Location: [facility] CATH LAB;  Service: Cardiovascular;;  . CORONARY STENT INTERVENTION N/A 05/18/2019   Procedure: CORONARY STENT INTERVENTION;  Surgeon: Marykay Lex, MD;  Location: [facility] INVASIVE CV LAB;  Service: Cardiovascular;  Laterality: N/A;  . CORONARY STENT PLACEMENT  2004   x2 LAD and RCA   . LEFT HEART CATH AND CORONARY ANGIOGRAPHY N/A 05/18/2019   Procedure: LEFT HEART CATH AND CORONARY ANGIOGRAPHY;  Surgeon: Marykay Lex, MD;  Location: [facility] INVASIVE CV LAB;  Service: Cardiovascular;  Laterality: N/A;  . LEFT HEART CATHETERIZATION WITH CORONARY ANGIOGRAM N/A 08/02/2014   Procedure: LEFT HEART CATHETERIZATION WITH CORONARY ANGIOGRAM;  Surgeon: Corky Crafts, MD;  Location: [facility] CATH LAB;  Service: Cardiovascular;  Laterality: N/A;  . PROSTATE SURGERY    . SPINAL FUSION     L4-S1     A IV Location/Drains/Wounds Patient Lines/Drains/Airways Status   Active Line/Drains/Airways    Name:   Placement date:   Placement time:   Site:   Days:   Peripheral IV 06/07/19 Right Antecubital   06/07/19    2220    Antecubital   1          Intake/Output Last 24 hours No intake or output data in the 24 hours ending 06/08/19 0044  Labs/Imaging Results for orders placed or performed during the hospital encounter of 06/07/19 (from the past 48 hour(s))  Basic metabolic panel     Status: Abnormal   Collection Time: 06/07/19  7:47 PM  Result Value Ref Range   Sodium 135 135 - 145 mmol/L   Potassium 4.1 3.5 - 5.1 mmol/L   Chloride 104 98 - 111 mmol/L   CO2 19 (L) 22 - 32 mmol/L   Glucose, Bld 219 (H) 70 - 99 mg/dL   BUN 24 (H) 8 - 23 mg/dL   Creatinine, Ser 6.94 0.61 - 1.24 mg/dL   Calcium 9.2 8.9 - 85.4 mg/dL   GFR calc non Af Amer 57 (L) >60 mL/min   GFR calc Af Amer >60 >60 mL/min   Anion gap 12 5 - 15    Comment: Performed at [facility], [address], [place], [place] [address]  Troponin I (High Sensitivity)     Status: Abnormal   Collection Time:  06/07/19  7:47 PM  Result Value Ref Range   Troponin I (High Sensitivity) 34 (H) <18 ng/L    Comment: Performed at [facility], [address], [place], [place] [address]  CBC     Status: None   Collection Time: 06/07/19  7:47 PM  Result Value Ref Range   WBC 9.0 4.0 - 10.5 K/uL   RBC 4.38 4.22 - 5.81 MIL/uL   Hemoglobin 14.1 13.0 - 17.0 g/dL   HCT 81.8 29.9 - 37.1 %   MCV 90.4 80.0 - 100.0 fL   MCH 32.2 26.0 - 34.0 pg   MCHC 35.6 30.0 - 36.0 g/dL   RDW 69.6 78.9 -  15.5 %   Platelets 279 150 - 400 K/uL   nRBC 0.0 0.0 - 0.2 %    Comment: Performed at [facility], [address] [place], [place] [address]  Troponin I (High Sensitivity)     Status: Abnormal   Collection Time: 06/07/19  9:47 PM  Result Value Ref Range   Troponin I (High Sensitivity) 33 (H) <18 ng/L    Comment: (NOTE) Elevated high sensitivity troponin I (hsTnI) values and significant  changes across serial measurements may suggest ACS but many other  chronic and acute conditions are known to elevate hsTnI results.  Refer to the "Links" section for chest pain algorithms and additional  guidance. Performed at [facility], [address] [place], [place] [address]   CBG monitoring, ED     Status: Abnormal   Collection Time: 06/07/19 11:36 PM  Result Value Ref Range   Glucose-Capillary 209 (H) 70 - 99 mg/dL   Dg Chest Portable 1 View  Result Date: 06/07/2019 CLINICAL DATA:  Chest pain EXAM: PORTABLE CHEST 1 VIEW COMPARISON:  05/17/2019 FINDINGS: Heart and mediastinal contours are within normal limits. No focal opacities or effusions. No acute bony abnormality. IMPRESSION: No active disease. Electronically Signed   By: Charlett Nose M.D.   On: 06/07/2019 20:03    Pending Labs Unresulted Labs (From admission, onward)    Start     Ordered   06/07/19 2109  SARS CORONAVIRUS 2 (TAT 6-24 HRS) Nasopharyngeal Nasopharyngeal Swab  (Asymptomatic/Tier 3)  Once,   STAT    Question Answer Comment  Is  this test for diagnosis or screening Screening   Symptomatic for COVID-19 as defined by CDC No   Hospitalized for COVID-19 No   Admitted to ICU for COVID-19 No   Previously tested for COVID-19 Yes   Resident in a congregate (group) care setting No   Employed in healthcare setting No      06/07/19 2108          Vitals/Pain Today's Vitals   06/07/19 2230 06/07/19 2232 06/07/19 2300 06/07/19 2336  BP: (!) 146/79  (!) 146/76 (!) 146/76  Pulse: (!) 57  (!) 56 (!) 56  Resp: 14  15   Temp:      TempSrc:      SpO2: 98%  97%   Weight:      Height:      PainSc:  0-No pain      Isolation Precautions No active isolations  Medications Medications  aspirin chewable tablet 324 mg (0 mg Oral Hold 06/07/19 1957)  amLODipine (NORVASC) tablet 10 mg (has no administration in time range)  carvedilol (COREG) tablet 3.125 mg (3.125 mg Oral Not Given 06/07/19 2336)  isosorbide mononitrate (IMDUR) 24 hr tablet 120 mg (has no administration in time range)  lisinopril (ZESTRIL) tablet 20 mg (has no administration in time range)  clopidogrel (PLAVIX) tablet 75 mg (has no administration in time range)  fluticasone (FLONASE) 50 MCG/ACT nasal spray 2 spray (has no administration in time range)  nitroGLYCERIN (NITROSTAT) SL tablet 0.4 mg (has no administration in time range)  acetaminophen (TYLENOL) tablet 650 mg (has no administration in time range)  insulin aspart (novoLOG) injection 0-9 Units (has no administration in time range)  insulin aspart (novoLOG) injection 0-5 Units (2 Units Subcutaneous Given 06/07/19 2344)  insulin glargine (LANTUS) injection 10 Units (has no administration in time range)  famotidine (PEPCID) IVPB 20 mg premix (0 mg Intravenous Stopped 06/07/19 2300)  alum &  mag hydroxide-simeth (MAALOX/MYLANTA) 200-200-20 MG/5ML suspension 15 mL (15 mLs Oral Given 06/07/19 2229)    Mobility walks with person assist Low fall risk   Focused Assessments Cardiac Assessment  Handoff:  Cardiac Rhythm: Sinus bradycardia Lab Results  Component Value Date   CKTOTAL 46 07/28/2011   CKMB 2.8 07/28/2011   TROPONINI 0.04 (HH) 05/30/2016   No results found for: DDIMER Does the Patient currently have chest pain? No     R Recommendations: See Admitting Provider Note  Report given to:   Additional Notes: -

## 2019-06-08 NOTE — ED Notes (Signed)
Attempted to call report. Will call back 10-15 minutes.

## 2019-06-08 NOTE — Progress Notes (Signed)
  Echocardiogram 2D Echocardiogram has been performed.  Joseph [name] 06/08/2019, 3:57 PM

## 2019-06-08 NOTE — ED Notes (Signed)
RN updated cardiologist r/t pt's pain. Pt denies pain at this time. No PRN SL NTG given.

## 2019-06-08 NOTE — Progress Notes (Signed)
ANTICOAGULATION CONSULT NOTE - Initial Consult  Pharmacy Consult:  Heparin Indication:  [place] and history of Afib  Allergies  Allergen Reactions  . Bempedoic Acid Other (See Comments)    NEXLETOL Myalgias, GI upset  . [name] [Alirocumab] Other (See Comments)    Muscle weakness and rash  . Rosuvastatin Other (See Comments)    Low energy/leg and hip pain  . Zetia [Ezetimibe] Other (See Comments)    myalgias    Patient Measurements: Height: 6\' 2"  (188 cm) Weight: 207 lb 12.8 oz (94.3 kg) IBW/kg (Calculated) : 82.2 Heparin Dosing Weight: 94 kg  Vital Signs: Temp: 98 F (36.7 C) (11/20 0728) Temp Source: Oral (11/20 0728) BP: 138/84 (11/20 0728) Pulse Rate: 60 (11/20 0728)  Labs: Recent Labs    06/07/19 1947 06/07/19 2147 06/08/19 0300  HGB 14.1  --   --   HCT 39.6  --   --   PLT 279  --   --   CREATININE 1.20  --  1.13  TROPONINIHS 34* 33*  --     Estimated Creatinine Clearance: 60.6 mL/min (by C-G formula based on SCr of 1.13 mg/dL).   Medical History: Past Medical History:  Diagnosis Date  . Abdominal discomfort    Abdominal discomfort: EGD 2011 showed no significant abnormalities.  Possibly due to metformin.  . Allergic rhinitis   . Anticoagulated 05/30/2016  . CAD (coronary artery disease)    a. s/p MI in 2004:  stents to the LAD and staged stent RCA, EF 55%;  b.  NSTEMI (1/16):  LHC - mid LAD stent with diff dist restenosis, prox to mid Dx mod dsz jailed by stent, small OM1 99, mCFX 75, dCFX 80, mRCA stent ok, mPDA mild to mod dsz, EF 55% >> PCI:  Synergy DES to LAD; Synergy DES (x2) mid and dist CFX  . 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  s/p back surgery  . DM (diabetes mellitus) ([place])   . History of CVA (cerebrovascular accident)    Small CVA seen by head CT (incidental) in 1/13.  Carotid dopplers in 1/13 showed minimal disease  . History of Doppler ultrasound    Arterial dopplers  (3/11): no evidence for significant PAD. ABIs 10/12: Normal.    . HLD (hyperlipidemia)   . HTN (hypertension)    had side effects with chlorthalidone  . Hx of cardiovascular stress test    Myoview (1/13): Small fixed apical septal defect with no ischemia, EF 58%.  [name] Hx of echocardiogram    Echo (3/11): EF 60%, normal wall motion, mild MR, mild left atrial enlargement, mildly dilated ascending aorta.  [name] PAF (paroxysmal atrial fibrillation) ([place]) 05/29/2016  . Spondylolisthesis    L4-S1     Assessment: 78 YOM presented with chest pain.  Patient has a history of Afib on Eliquis PTA, last dose 06/07/19 PM per documentation.  Pharmacy consulted to initiate IV heparin.  Baseline labs reviewed.  Goal of Therapy:  Heparin level 0.3-0.7 units/ml aPTT 66 - 102 seconds Monitor platelets by anticoagulation protocol: Yes   Plan:  Heparin gtt at 1300 units/hr, no bolus Check 8 hr aPTT Daily heparin level, aPTT and CBC Cath Monday  Hazleigh Mccleave D. Mina Marble, PharmD, BCPS, [place] 06/08/2019, 11:22 AM

## 2019-06-08 NOTE — Plan of Care (Signed)

## 2019-06-08 NOTE — Progress Notes (Addendum)
Progress Note  [name] Date of Encounter: 06/08/2019  Primary Cardiologist: Mertie Moores, MD   Subjective   Patient denies any current chest pain or shortness of breath.  He says that he had some mild chest pressure last night in the ED without shortness of breath.  No real symptoms since then.  Inpatient Medications    Scheduled Meds: . amLODipine  10 mg Oral Daily  . aspirin  324 mg Oral Once  . aspirin EC  81 mg Oral Daily  . carvedilol  3.125 mg Oral BID  . clopidogrel  75 mg Oral Daily  . insulin aspart  0-5 Units Subcutaneous QHS  . insulin aspart  0-9 Units Subcutaneous TID WC  . insulin glargine  10 Units Subcutaneous Daily  . isosorbide mononitrate  120 mg Oral Daily  . lisinopril  20 mg Oral Daily   Continuous Infusions:  PRN Meds: acetaminophen, fluticasone, nitroGLYCERIN, nitroGLYCERIN   Vital Signs    Vitals:   06/08/19 0000 06/08/19 0115 06/08/19 0344 06/08/19 0728  BP: 140/71 (!) 153/77 (!) 143/73 138/84  Pulse: (!) 57 (!) 55 (!) 53 60  Resp: 17 17 18    Temp:  (!) 97.5 F (36.4 C) 97.9 F (36.6 C) 98 F (36.7 C)  TempSrc:  Oral Oral Oral  SpO2: 98% 98% 100% 98%  Weight:  94.3 kg    Height:        Intake/Output Summary (Last 24 hours) at 06/08/2019 1024 Last data filed at 06/08/2019 0826 Gross per 24 hour  Intake 0 ml  Output -  Net 0 ml   Last 3 Weights 06/08/2019 06/07/2019 05/19/2019  Weight (lbs) 207 lb 12.8 oz 211 lb 6.7 oz 211 lb 6.7 oz  Weight (kg) 94.257 kg 95.9 kg 95.9 kg      Telemetry    SB/SR with rates 50s-60s.- Personally Reviewed  ECG    Sinus bradycardia with 1st degree A-V block, 53 bpm, LAD, abnormal R wave progression-no significant changes from previous - Personally Reviewed  Physical Exam   GEN: No acute distress.   Neck: No JVD Cardiac: RRR, no murmurs, rubs, or gallops.  Respiratory: Clear to auscultation bilaterally. GI: Soft, nontender, non-distended  MS: No edema; No deformity.  Neuro:  Nonfocal  Psych: Normal affect   Labs    High Sensitivity Troponin:   Recent Labs  Lab 05/17/19 2352 05/18/19 0210 06/07/19 1947 06/07/19 2147  TROPONINIHS 20* 23* 34* 33*      Chemistry Recent Labs  Lab 06/07/19 1947 06/08/19 0300  NA 135 140  K 4.1 3.7  CL 104 106  CO2 19* 24  GLUCOSE 219* 161*  BUN 24* 21  CREATININE 1.20 1.13  CALCIUM 9.2 9.4  GFRNONAA 57* >60  GFRAA >60 >60  ANIONGAP 12 10     Hematology Recent Labs  Lab 06/07/19 1947  WBC 9.0  RBC 4.38  HGB 14.1  HCT 39.6  MCV 90.4  MCH 32.2  MCHC 35.6  RDW 11.7  PLT 279    BNPNo results for input(s): BNP, PROBNP in the last 168 hours.   DDimer No results for input(s): DDIMER in the last 168 hours.   Radiology    Dg Chest Portable 1 View  Result Date: 06/07/2019 CLINICAL DATA:  Chest pain EXAM: PORTABLE CHEST 1 VIEW COMPARISON:  05/17/2019 FINDINGS: Heart and mediastinal contours are within normal limits. No focal opacities or effusions. No acute bony abnormality. IMPRESSION: No active disease. Electronically Signed  By: Charlett Nose M.D.   On: 06/07/2019 20:03    Cardiac Studies   CORONARY STENT INTERVENTION  LEFT HEART CATH AND CORONARY ANGIOGRAPHY 05/18/2019  Conclusion   CULPRIT LESIONS: Dist RCA lesion is 90% stenosed with 90% stenosed side branch in RPDA. followed by RPDA lesion is 70% stenosed.  A drug-eluting stent was successfully placed covering the more distal segment in the PDA using a STENT RESOLUTE ONYX 2.5X22. Postdilated 2.6 mm  A second drug-eluting stent was successfully placed covering the proximal lesion into the distal RCA in overlapping the PDA stent, using a STENT RESOLUTE ONYX 2.5X22. Postdilated to 2.9 mm  2 Overlapping stents used b/c unable to advance a 38 mm stent due to tortuous RCA & lack of Guide support.  Post intervention, there is a 0% residual stenosis.  Post intervention, the side branch was reduced to 0% residual stenosis.   -------------------------------------------------------------  Prox RCA to Mid RCA previously placed stents are ~5% stenosed.  Prox LAD to Mid LAD lesion is 30% stenosed. This is followed by mid LAD to [facility] LAD overlapping stents with 30% in-stent restenosis and with stable (from 2016) 70% ostial-proximal & 75% mid stenosis in side branch in 2nd Diag.  2nd Mrg lesion is 75% stenosed -- Not PCI target (stable from 2016)  -------------------------------------------------------------  There is mild left ventricular systolic dysfunction. The left ventricular ejection fraction is 45-50% by visual estimate. Mid anterior-apical anterior hypokinesis. LV end diastolic pressure is normal.   SUMMARY  Multivessel CAD involving RCA-PDA, LAD & 1st Diag as well as 1st Mrg. -->  Stable disease in the small side branches with mild LAD ISR and widely patent RCA stents.  CULPRIT LESION: Distal RCA-PDA 90% followed a 70% stenosis ? successfully treated with 2 overlapping Resolute Onyx DES stents (2.5 mm x 22 mm x 2 -postdilated from 2.9-2.6 mm)  Codominant system with relatively small caliber LAD and very large circumflex with LPLOM branch and RCA basically giving the RPDA that is a wraparound PDA covering the distal anterior apex  Mildly reduced LVEF with anterior hypokinesis -EF roughly 45 to 50%.  Normal LVEDP  RECOMMENDATIONS  Patient will return to nursing unit after procedure for TR band removal.  Anticipate discharge in the morning.  Was bolused with Brilinta here in the Cath Lab, will give 1 more dose of Brilinta tonight and convert to catheter goal in the morning 10/31 -> 300 mg clopidogrel written for (ensure that this is given, would then take 75 mg daily going forward   Continue aggressive risk factor modification  Follow-up with Dr. Delbert Harness, MD   Echocardiogram 05/18/2019 IMPRESSIONS   1. Left ventricular ejection fraction, by visual estimation, is 55 to 60%. The  left ventricle has normal function. There is moderately increased left ventricular hypertrophy.  2. Entire anterior septum, mid anterior segment, and apex are abnormal.  3. Definity contrast agent was given IV to delineate the left ventricular endocardial borders.  4. Elevated left ventricular end-diastolic pressure.  5. Left ventricular diastolic parameters are consistent with Grade I diastolic dysfunction (impaired relaxation).  6. The left ventricle demonstrates regional wall motion abnormalities.  7. Global right ventricle has normal systolic function.The right ventricular size is normal. No increase in right ventricular wall thickness.  8. Left atrial size was normal.  9. Right atrial size was normal. 10. The mitral valve is normal in structure. No evidence of mitral valve regurgitation. No evidence of mitral stenosis. 11. The tricuspid valve is normal  in structure. Tricuspid valve regurgitation is not demonstrated. 12. The aortic valve is normal in structure. Aortic valve regurgitation is not visualized. No evidence of aortic valve sclerosis or stenosis. 13. The pulmonic valve was normal in structure. Pulmonic valve regurgitation is not visualized. 14. The inferior vena cava is normal in size with greater than 50% respiratory variability, suggesting right atrial pressure of 3 mmHg. 15. Hypokinesis consistent with ischemia in the LAD territory, overall LVEF is preserved because other walls are hyperdynamic.  Patient Profile     78 y.o. 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   Assessment & Plan    Chest pain -Patient with recent PCI with DES to mid-distal RCA 05/18/2019.  Echo at that time with normal LV systolic function. -Presented with weakness, mild chest discomfort and shortness of breath similar to prior to his recent PCI. -High-sensitivity  troponins low and flat, 34 and 33. -Patient was continued on amlodipine 10 mg and Imdur 120 mg for anginal therapy.  Continues on aspirin and Plavix. -Eliquis is being held with last dose last evening. -We would like for patient to have cardiac cath to re-evaluate coronary arteries and stents, however, with last dose of Eliquis being last night at 5 pm, this will not be able to happen today. -We will monitor the patient over the weekend with plan for cath on cath on Monday. He is on the schedule for 1:00 with Dr. Okey DupreEnd.  -Continue to monitor symptoms and telemetry for any arrhythmia causes of his symptoms. EKG if symptoms.   Paroxysmal atrial fibrillation -Maintaining sinus rhythm -Patient is anticoagulated with Eliquis for stroke risk reduction at home with CHA2DS2-VASc score of 7.  Will initiate heparin drip while off Eliquis.  Hypertension -Blood pressure very mildly elevated.  Continuing on his home medications.  Diabetes type 2 -On sliding scale insulin while hospitalized   For questions or updates, please contact [facility] Please consult [email or web address] for contact info under        Signed, Berton BonJanine Hammond, NP  06/08/2019, 10:24 AM    Attending Note:   The patient was seen and examined.  Agree with assessment and plan as noted above.  Changes made to the above note as needed.  Patient seen and independently examined with Lizabeth LeydenNina Hammond, NP .   We discussed all aspects of the encounter. I agree with the assessment and plan as stated above.  1.   Unstable angina.  Symptoms are concerning for UAP.   Similar to his previous episodes of CP prior to stenting .  Took eliquis last night.   Unable to cath today. Will schedule for Monday . On iv heparin ( per pharmacy )   2.  Hyperlipidemia :  3.  Hypertension:  Cont meds.   4.  Dm:  In SS insulin      I have spent a total of 40 minutes with patient reviewing hospital  notes , telemetry, EKGs, labs and examining patient as  well as establishing an assessment and plan that was discussed with the patient. > 50% of time was spent in direct patient care.    Vesta MixerPhilip J. Nahser, Montez HagemanJr., MD, [facility] CenterFACC 06/08/2019, 11:15 AM [address],  [address] Office [phone] Pager [phone]

## 2019-06-09 DIAGNOSIS — I259 Chronic ischemic heart disease, unspecified: Secondary | ICD-10-CM

## 2019-06-09 DIAGNOSIS — I25118 Atherosclerotic heart disease of native coronary artery with other forms of angina pectoris: Secondary | ICD-10-CM

## 2019-06-09 LAB — GLUCOSE, CAPILLARY
Glucose-Capillary: 178 mg/dL — ABNORMAL HIGH (ref 70–99)
Glucose-Capillary: 140 mg/dL — ABNORMAL HIGH (ref 70–99)
Glucose-Capillary: 161 mg/dL — ABNORMAL HIGH (ref 70–99)
Glucose-Capillary: 173 mg/dL — ABNORMAL HIGH (ref 70–99)
Glucose-Capillary: 168 mg/dL — ABNORMAL HIGH (ref 70–99)

## 2019-06-09 LAB — HEPARIN LEVEL (UNFRACTIONATED)
Heparin Unfractionated: 1.36 IU/mL — ABNORMAL HIGH (ref 0.30–0.70)
Heparin Unfractionated: 0.88 IU/mL — ABNORMAL HIGH (ref 0.30–0.70)

## 2019-06-09 LAB — CBC
HCT: 37.7 % — ABNORMAL LOW (ref 39.0–52.0)
Hemoglobin: 13.3 g/dL (ref 13.0–17.0)
MCH: 31.8 pg (ref 26.0–34.0)
MCHC: 35.3 g/dL (ref 30.0–36.0)
MCV: 90.2 fL (ref 80.0–100.0)
Platelets: 249 10*3/uL (ref 150–400)
RBC: 4.18 MIL/uL — ABNORMAL LOW (ref 4.22–5.81)
RDW: 11.8 % (ref 11.5–15.5)
WBC: 9.4 10*3/uL (ref 4.0–10.5)
nRBC: 0 % (ref 0.0–0.2)

## 2019-06-09 LAB — APTT
aPTT: 99 seconds — ABNORMAL HIGH (ref 24–36)
aPTT: 121 seconds — ABNORMAL HIGH (ref 24–36)

## 2019-06-09 NOTE — Plan of Care (Signed)

## 2019-06-09 NOTE — Progress Notes (Signed)
ANTICOAGULATION CONSULT NOTE  Pharmacy Consult:  Heparin Indication:  [place] and history of Afib  Allergies  Allergen Reactions  . Bempedoic Acid Other (See Comments)    NEXLETOL Myalgias, GI upset  . [name] [Alirocumab] Other (See Comments)    Muscle weakness and rash  . Rosuvastatin Other (See Comments)    Low energy/leg and hip pain  . Zetia [Ezetimibe] Other (See Comments)    myalgias    Patient Measurements: Height: 6\' 2"  (188 cm) Weight: 207 lb 6.4 oz (94.1 kg) IBW/kg (Calculated) : 82.2 Heparin Dosing Weight: 94 kg  Vital Signs: Temp: 97.6 F (36.4 C) (11/21 0321) Temp Source: Oral (11/21 0321) BP: 127/59 (11/21 0321) Pulse Rate: 56 (11/21 0321)  Labs: Recent Labs    06/07/19 1947 06/07/19 2147 06/08/19 0300 06/08/19 2049 06/09/19 0508  HGB 14.1  --   --   --  13.3  HCT 39.6  --   --   --  37.7*  PLT 279  --   --   --  249  APTT  --   --   --  56* 99*  HEPARINUNFRC  --   --   --  1.30*  --   CREATININE 1.20  --  1.13  --   --   TROPONINIHS 34* 33*  --   --   --     Estimated Creatinine Clearance: 60.6 mL/min (by C-G formula based on SCr of 1.13 mg/dL).   Medical History: Past Medical History:  Diagnosis Date  . Abdominal discomfort    Abdominal discomfort: EGD 2011 showed no significant abnormalities.  Possibly due to metformin.  . Allergic rhinitis   . Anticoagulated 05/30/2016  . CAD (coronary artery disease)    a. s/p MI in 2004:  stents to the LAD and staged stent RCA, EF 55%;  b.  NSTEMI (1/16):  LHC - mid LAD stent with diff dist restenosis, prox to mid Dx mod dsz jailed by stent, small OM1 99, mCFX 75, dCFX 80, mRCA stent ok, mPDA mild to mod dsz, EF 55% >> PCI:  Synergy DES to LAD; Synergy DES (x2) mid and dist CFX  . 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  s/p back surgery  . DM (diabetes mellitus) (HCC)   . History of CVA (cerebrovascular accident)    Small CVA seen by head  CT (incidental) in 1/13.  Carotid dopplers in 1/13 showed minimal disease  . History of Doppler ultrasound    Arterial dopplers (3/11): no evidence for significant PAD. ABIs 10/12: Normal.    . HLD (hyperlipidemia)   . HTN (hypertension)    had side effects with chlorthalidone  . Hx of cardiovascular stress test    Myoview (1/13): Small fixed apical septal defect with no ischemia, EF 58%.  03-18-1995 Hx of echocardiogram    Echo (3/11): EF 60%, normal wall motion, mild MR, mild left atrial enlargement, mildly dilated ascending aorta.  11-24-1976 PAF (paroxysmal atrial fibrillation) (HCC) 05/29/2016  . Spondylolisthesis    L4-S1     Assessment: 78 YOM presented with chest pain.  Patient has a history of Afib on Eliquis PTA, last dose 06/07/19 PM per documentation.  Pharmacy consulted to dose heparin.   -heparin level= 1.3 (influence of apixaban) -aPTT= 56   11/21 AM update:  APTT therapeutic after rate increase  Goal of Therapy:  Heparin level 0.3-0.7 units/ml aPTT 66 - 102 seconds Monitor platelets by anticoagulation  protocol: Yes   Plan:  Cont heparin at 1500 units/hr 1200 confirmatory aPTT  Narda Bonds, PharmD, BCPS Clinical Pharmacist Phone: [phone]

## 2019-06-09 NOTE — Progress Notes (Signed)
Progress Note  [name] Date of Encounter: 06/09/2019  Primary Cardiologist: Kristeen Miss, MD   Subjective   Patient denies any current chest pain or shortness of breath.   The patient was not able to have a cath yesterday because he had taken Eliquis the night before.  Troponin levels remain minimally elevated with a flat trend.   Inpatient Medications    Scheduled Meds: . amLODipine  10 mg Oral Daily  . aspirin  324 mg Oral Once  . aspirin EC  81 mg Oral Daily  . carvedilol  3.125 mg Oral BID  . clopidogrel  75 mg Oral Daily  . insulin aspart  0-5 Units Subcutaneous QHS  . insulin aspart  0-9 Units Subcutaneous TID WC  . insulin glargine  10 Units Subcutaneous Daily  . isosorbide mononitrate  120 mg Oral Daily  . lisinopril  20 mg Oral Daily  . sodium chloride flush  3 mL Intravenous Q12H   Continuous Infusions: . heparin 1,500 Units/hr (06/09/19 0713)   PRN Meds: acetaminophen, fluticasone, nitroGLYCERIN, nitroGLYCERIN   Vital Signs    Vitals:   06/08/19 2021 06/08/19 2229 06/09/19 0321 06/09/19 0814  BP: 134/74  (!) 127/59 (!) 142/65  Pulse: 60 65 (!) 56 (!) 55  Resp: 18  17 18   Temp: 97.8 F (36.6 C)  97.6 F (36.4 C)   TempSrc: Oral  Oral   SpO2: 96%  97%   Weight:   94.1 kg   Height:        Intake/Output Summary (Last 24 hours) at 06/09/2019 1052 Last data filed at 06/09/2019 0900 Gross per 24 hour  Intake 1605.89 ml  Output 1375 ml  Net 230.89 ml   Last 3 Weights 06/09/2019 06/08/2019 06/07/2019  Weight (lbs) 207 lb 6.4 oz 207 lb 12.8 oz 211 lb 6.7 oz  Weight (kg) 94.076 kg 94.257 kg 95.9 kg      Telemetry    SB/SR with rates 50s-60s.- Personally Reviewed  ECG    Sinus bradycardia with 1st degree A-V block, 53 bpm, LAD, abnormal R wave progression-no significant changes from previous - Personally Reviewed  Physical Exam   Physical Exam: Blood pressure (!) 142/65, pulse (!) 55, temperature 97.6 F (36.4 C),  temperature source Oral, resp. rate 18, height 6\' 2"  (1.88 m), weight 94.1 kg, SpO2 97 %.  GEN:  Well nourished, well developed in no acute distress HEENT: Normal NECK: No JVD; No carotid bruits LYMPHATICS: No lymphadenopathy CARDIAC: RRR   RESPIRATORY:  Clear to auscultation without rales, wheezing or rhonchi  ABDOMEN: Soft, non-tender, non-distended MUSCULOSKELETAL:  No edema; No deformity  SKIN: Warm and dry NEUROLOGIC:  Alert and oriented x 3   Labs    High Sensitivity Troponin:   Recent Labs  Lab 05/17/19 2352 05/18/19 0210 06/07/19 1947 06/07/19 2147  TROPONINIHS 20* 23* 34* 33*      Chemistry Recent Labs  Lab 06/07/19 1947 06/08/19 0300  NA 135 140  K 4.1 3.7  CL 104 106  CO2 19* 24  GLUCOSE 219* 161*  BUN 24* 21  CREATININE 1.20 1.13  CALCIUM 9.2 9.4  GFRNONAA 57* >60  GFRAA >60 >60  ANIONGAP 12 10     Hematology Recent Labs  Lab 06/07/19 1947 06/09/19 0508  WBC 9.0 9.4  RBC 4.38 4.18*  HGB 14.1 13.3  HCT 39.6 37.7*  MCV 90.4 90.2  MCH 32.2 31.8  MCHC 35.6 35.3  RDW 11.7 11.8  PLT 279  249    BNPNo results for input(s): BNP, PROBNP in the last 168 hours.   DDimer No results for input(s): DDIMER in the last 168 hours.   Radiology    Dg Chest Portable 1 View  Result Date: 06/07/2019 CLINICAL DATA:  Chest pain EXAM: PORTABLE CHEST 1 VIEW COMPARISON:  05/17/2019 FINDINGS: Heart and mediastinal contours are within normal limits. No focal opacities or effusions. No acute bony abnormality. IMPRESSION: No active disease. Electronically Signed   By: Rolm Baptise M.D.   On: 06/07/2019 20:03    Cardiac Studies   CORONARY STENT INTERVENTION  LEFT HEART CATH AND CORONARY ANGIOGRAPHY 05/18/2019  Conclusion   CULPRIT LESIONS: Dist RCA lesion is 90% stenosed with 90% stenosed side branch in RPDA. followed by RPDA lesion is 70% stenosed.  A drug-eluting stent was successfully placed covering the more distal segment in the PDA using a STENT  RESOLUTE ONYX 2.5X22. Postdilated 2.6 mm  A second drug-eluting stent was successfully placed covering the proximal lesion into the distal RCA in overlapping the PDA stent, using a STENT RESOLUTE ONYX 2.5X22. Postdilated to 2.9 mm  2 Overlapping stents used b/c unable to advance a 38 mm stent due to tortuous RCA & lack of Guide support.  Post intervention, there is a 0% residual stenosis.  Post intervention, the side branch was reduced to 0% residual stenosis.  -------------------------------------------------------------  Prox RCA to Mid RCA previously placed stents are ~5% stenosed.  Prox LAD to Mid LAD lesion is 30% stenosed. This is followed by mid LAD to [facility] LAD overlapping stents with 30% in-stent restenosis and with stable (from 2016) 70% ostial-proximal & 75% mid stenosis in side branch in 2nd Diag.  2nd Mrg lesion is 75% stenosed -- Not PCI target (stable from 2016)  -------------------------------------------------------------  There is mild left ventricular systolic dysfunction. The left ventricular ejection fraction is 45-50% by visual estimate. Mid anterior-apical anterior hypokinesis. LV end diastolic pressure is normal.   SUMMARY  Multivessel CAD involving RCA-PDA, LAD & 1st Diag as well as 1st Mrg. -->  Stable disease in the small side branches with mild LAD ISR and widely patent RCA stents.  CULPRIT LESION: Distal RCA-PDA 90% followed a 70% stenosis ? successfully treated with 2 overlapping Resolute Onyx DES stents (2.5 mm x 22 mm x 2 -postdilated from 2.9-2.6 mm)  Codominant system with relatively small caliber LAD and very large circumflex with LPLOM branch and RCA basically giving the RPDA that is a wraparound PDA covering the distal anterior apex  Mildly reduced LVEF with anterior hypokinesis -EF roughly 45 to 50%.  Normal LVEDP  RECOMMENDATIONS  Patient will return to nursing unit after procedure for TR band removal.  Anticipate discharge in the morning.   Was bolused with Brilinta here in the Cath Lab, will give 1 more dose of Brilinta tonight and convert to catheter goal in the morning 10/31 -> 300 mg clopidogrel written for (ensure that this is given, would then take 75 mg daily going forward   Continue aggressive risk factor modification  Follow-up with Dr. Fara Olden, MD   Echocardiogram 05/18/2019 IMPRESSIONS   1. Left ventricular ejection fraction, by visual estimation, is 55 to 60%. The left ventricle has normal function. There is moderately increased left ventricular hypertrophy.  2. Entire anterior septum, mid anterior segment, and apex are abnormal.  3. Definity contrast agent was given IV to delineate the left ventricular endocardial borders.  4. Elevated left ventricular end-diastolic pressure.  5. Left  ventricular diastolic parameters are consistent with Grade I diastolic dysfunction (impaired relaxation).  6. The left ventricle demonstrates regional wall motion abnormalities.  7. Global right ventricle has normal systolic function.The right ventricular size is normal. No increase in right ventricular wall thickness.  8. Left atrial size was normal.  9. Right atrial size was normal. 10. The mitral valve is normal in structure. No evidence of mitral valve regurgitation. No evidence of mitral stenosis. 11. The tricuspid valve is normal in structure. Tricuspid valve regurgitation is not demonstrated. 12. The aortic valve is normal in structure. Aortic valve regurgitation is not visualized. No evidence of aortic valve sclerosis or stenosis. 13. The pulmonic valve was normal in structure. Pulmonic valve regurgitation is not visualized. 14. The inferior vena cava is normal in size with greater than 50% respiratory variability, suggesting right atrial pressure of 3 mmHg. 15. Hypokinesis consistent with ischemia in the LAD territory, overall LVEF is preserved because other walls are hyperdynamic.  Patient Profile      78 y.o. 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   Assessment & Plan    Coronary artery disease: The patient has a history of stenting of his right coronary artery.  He had stenting to his RCA, LAD and left circumflex in the past.  He has a history of PAF and hypertension.  He now presents with escalating episodes of chest discomfort that were very similar to his presenting episodes of angina prior to stenting.  Troponin  levels are minimally elevated and the trend is flat.  We were not able to do a cath yesterday because he took Eliquis the night before.  Eliquis has now been on hold and he is on heparin drip.  The plan is for heart catheterization with possible PCI on Monday.  2.  Hypertension: Blood pressure is minimally elevated.  Continue current medications.

## 2019-06-09 NOTE — Progress Notes (Signed)
ANTICOAGULATION CONSULT NOTE  Pharmacy Consult:  Heparin Indication:  [place] and history of Afib  Patient Measurements: Height: 6\' 2"  (188 cm) Weight: 207 lb 6.4 oz (94.1 kg) IBW/kg (Calculated) : 82.2 Heparin Dosing Weight: 94 kg  Vital Signs: Temp: 98.7 F (37.1 C) (11/21 1111) Temp Source: Oral (11/21 1111) BP: 117/59 (11/21 2146) Pulse Rate: 60 (11/21 2146)  Labs: Recent Labs    06/07/19 1947 06/07/19 2147 06/08/19 0300 06/08/19 2049 06/09/19 0508 06/09/19 1115 06/09/19 2136  HGB 14.1  --   --   --  13.3  --   --   HCT 39.6  --   --   --  37.7*  --   --   PLT 279  --   --   --  249  --   --   APTT  --   --   --  56* 99* 121*  --   HEPARINUNFRC  --   --   --  1.30* 1.36*  --  0.88*  CREATININE 1.20  --  1.13  --   --   --   --   TROPONINIHS 34* 33*  --   --   --   --   --     Estimated Creatinine Clearance: 60.6 mL/min (by C-G formula based on SCr of 1.13 mg/dL).  Assessment: 66 YOM presented with chest pain/unstabel angina.  Patient has a history of Afib on Eliquis PTA, last dose 06/07/19 PM per documentation.  Pharmacy consulted to dose heparin. Will monitor aPTT levels until HL is not affected by apixaban.  Heparin level elevated at 0.88   Goal of Therapy:  Heparin level 0.3-0.7 units/ml aPTT 66 - 102 seconds Monitor platelets by anticoagulation protocol: Yes   Plan:  -Decrease heparin to 1250 units/hr  -Monitor daily aPTT, HL, CBC, plt -Monitor for signs/symptoms of bleeding   Thank you Anette Guarneri, PharmD  Please check AMION for all [place] phone numbers After 10:00 PM, call [place]

## 2019-06-09 NOTE — Progress Notes (Signed)
ANTICOAGULATION CONSULT NOTE  Pharmacy Consult:  Heparin Indication:  [place] and history of Afib  Patient Measurements: Height: 6\' 2"  (188 cm) Weight: 207 lb 6.4 oz (94.1 kg) IBW/kg (Calculated) : 82.2 Heparin Dosing Weight: 94 kg  Vital Signs: Temp: 98.7 F (37.1 C) (11/21 1111) Temp Source: Oral (11/21 1111) BP: 98/66 (11/21 1111) Pulse Rate: 67 (11/21 1111)  Labs: Recent Labs    06/07/19 1947 06/07/19 2147 06/08/19 0300 06/08/19 2049 06/09/19 0508 06/09/19 1115  HGB 14.1  --   --   --  13.3  --   HCT 39.6  --   --   --  37.7*  --   PLT 279  --   --   --  249  --   APTT  --   --   --  56* 99* 121*  HEPARINUNFRC  --   --   --  1.30* 1.36*  --   CREATININE 1.20  --  1.13  --   --   --   TROPONINIHS 34* 33*  --   --   --   --     Estimated Creatinine Clearance: 60.6 mL/min (by C-G formula based on SCr of 1.13 mg/dL).  Assessment: 59 YOM presented with chest pain/unstabel angina.  Patient has a history of Afib on Eliquis PTA, last dose 06/07/19 PM per documentation.  Pharmacy consulted to dose heparin. Will monitor aPTT levels until HL is not affected by apixaban.  APTT supratherapeutic. H/H stable, no report of bleeding.    Goal of Therapy:  Heparin level 0.3-0.7 units/ml aPTT 66 - 102 seconds Monitor platelets by anticoagulation protocol: Yes   Plan:  -Decrease heparin 1400 units/hr -F/u HL in 8 hr  -Monitor daily aPTT, HL, CBC, plt -Monitor for signs/symptoms of bleeding   Benetta Spar, PharmD, BCPS, BCCP Clinical Pharmacist  Please check AMION for all [place] phone numbers After 10:00 PM, call [place]

## 2019-06-10 DIAGNOSIS — I2510 Atherosclerotic heart disease of native coronary artery without angina pectoris: Secondary | ICD-10-CM

## 2019-06-10 LAB — HEPARIN LEVEL (UNFRACTIONATED): Heparin Unfractionated: 0.83 IU/mL — ABNORMAL HIGH (ref 0.30–0.70)

## 2019-06-10 LAB — CBC
HCT: 37.2 % — ABNORMAL LOW (ref 39.0–52.0)
Hemoglobin: 13 g/dL (ref 13.0–17.0)
MCH: 32 pg (ref 26.0–34.0)
MCHC: 34.9 g/dL (ref 30.0–36.0)
MCV: 91.6 fL (ref 80.0–100.0)
Platelets: 254 10*3/uL (ref 150–400)
RBC: 4.06 MIL/uL — ABNORMAL LOW (ref 4.22–5.81)
RDW: 11.9 % (ref 11.5–15.5)
WBC: 8.7 10*3/uL (ref 4.0–10.5)
nRBC: 0 % (ref 0.0–0.2)

## 2019-06-10 LAB — GLUCOSE, CAPILLARY
Glucose-Capillary: 134 mg/dL — ABNORMAL HIGH (ref 70–99)
Glucose-Capillary: 183 mg/dL — ABNORMAL HIGH (ref 70–99)
Glucose-Capillary: 205 mg/dL — ABNORMAL HIGH (ref 70–99)
Glucose-Capillary: 153 mg/dL — ABNORMAL HIGH (ref 70–99)
Glucose-Capillary: 192 mg/dL — ABNORMAL HIGH (ref 70–99)

## 2019-06-10 LAB — APTT: aPTT: 99 seconds — ABNORMAL HIGH (ref 24–36)

## 2019-06-10 MED ORDER — SODIUM CHLORIDE 0.9% FLUSH: 3.0000 mL | INTRAVENOUS | Status: DC | PRN

## 2019-06-10 MED ORDER — SODIUM CHLORIDE 0.9 % IV SOLN: 250.0000 mL | INTRAVENOUS | Status: DC | PRN

## 2019-06-10 MED ORDER — ASPIRIN 81 MG PO CHEW
81.0000 mg | CHEWABLE_TABLET | ORAL | Status: AC
  Administered 2019-06-11: 81 mg via ORAL
  Filled 2019-06-10: qty 1

## 2019-06-10 MED ORDER — SODIUM CHLORIDE 0.9 % WEIGHT BASED INFUSION
3.0000 mL/kg/h | INTRAVENOUS | Status: DC
  Administered 2019-06-11: 3 mL/kg/h via INTRAVENOUS

## 2019-06-10 MED ORDER — SODIUM CHLORIDE 0.9 % WEIGHT BASED INFUSION
1.0000 mL/kg/h | INTRAVENOUS | Status: DC
  Administered 2019-06-11: 1 mL/kg/h via INTRAVENOUS

## 2019-06-10 NOTE — Progress Notes (Signed)
ANTICOAGULATION CONSULT NOTE  Pharmacy Consult:  Heparin Indication:  [place] and history of Afib  Patient Measurements: Height: 6\' 2"  (188 cm) Weight: 208 lb 4.8 oz (94.5 kg) IBW/kg (Calculated) : 82.2 Heparin Dosing Weight: 94 kg  Vital Signs: Temp: 97.9 F (36.6 C) (11/22 0804) Temp Source: Oral (11/22 0804) BP: 149/76 (11/22 0925) Pulse Rate: 64 (11/22 0925)  Labs: Recent Labs    06/07/19 1947 06/07/19 2147 06/08/19 0300  06/09/19 0508 06/09/19 1115 06/09/19 2136 06/10/19 0614  HGB 14.1  --   --   --  13.3  --   --  13.0  HCT 39.6  --   --   --  37.7*  --   --  37.2*  PLT 279  --   --   --  249  --   --  254  APTT  --   --   --    < > 99* 121*  --  99*  HEPARINUNFRC  --   --   --    < > 1.36*  --  0.88* 0.83*  CREATININE 1.20  --  1.13  --   --   --   --   --   TROPONINIHS 34* 33*  --   --   --   --   --   --    < > = values in this interval not displayed.    Estimated Creatinine Clearance: 60.6 mL/min (by C-G formula based on SCr of 1.13 mg/dL).  Assessment: 29 YOM presented with chest pain/unstabel angina.  Patient has a history of Afib on Eliquis PTA, last dose 06/07/19 PM per documentation.  Pharmacy consulted to dose heparin. Will monitor aPTT levels until HL is not affected by apixaban.  APTT is therapeutic but HL still  supratherapeutic. APTT and HL are much more concordant than before indicating apixaban effect on HL is wearing off. Anticipate should be able to use HL monitoring tomorrow. H/H stable, no report of bleeding.    Goal of Therapy:  Heparin level 0.3-0.7 units/ml aPTT 66 - 102 seconds Monitor platelets by anticoagulation protocol: Yes   Plan:  -Continue heparin 1250 units/hr -Monitor daily aPTT, HL, CBC, plt -Change to HL monitoring if matches aPTT -Monitor for signs/symptoms of bleeding   Benetta Spar, PharmD, BCPS, BCCP Clinical Pharmacist  Please check AMION for all [place] phone numbers After 10:00 PM, call [place]

## 2019-06-10 NOTE — Progress Notes (Signed)
Progress Note  [name] Date of Encounter: 06/10/2019  Primary Cardiologist: Kristeen MissPhilip Felis Quillin, MD   Subjective   Patient denies any current chest pain or shortness of breath.   The patient was not able to have a cath yesterday because he had taken Eliquis the night before.  He feels well.  His not having any episodes of chest pain currently.  Inpatient Medications    Scheduled Meds: . amLODipine  10 mg Oral Daily  . aspirin  324 mg Oral Once  . aspirin EC  81 mg Oral Daily  . carvedilol  3.125 mg Oral BID  . clopidogrel  75 mg Oral Daily  . insulin aspart  0-5 Units Subcutaneous QHS  . insulin aspart  0-9 Units Subcutaneous TID WC  . insulin glargine  10 Units Subcutaneous Daily  . isosorbide mononitrate  120 mg Oral Daily  . lisinopril  20 mg Oral Daily  . sodium chloride flush  3 mL Intravenous Q12H   Continuous Infusions: . heparin 1,250 Units/hr (06/10/19 0304)   PRN Meds: acetaminophen, fluticasone, nitroGLYCERIN, nitroGLYCERIN   Vital Signs    Vitals:   06/10/19 0534 06/10/19 0804 06/10/19 0925 06/10/19 1057  BP: 123/69 121/61 (!) 149/76 (!) 145/73  Pulse: (!) 52 (!) 59 64 70  Resp:  16  15  Temp: 97.9 F (36.6 C) 97.9 F (36.6 C)  97.8 F (36.6 C)  TempSrc: Oral Oral  Oral  SpO2: 97% 97%  97%  Weight: 94.5 kg     Height:        Intake/Output Summary (Last 24 hours) at 06/10/2019 1132 Last data filed at 06/10/2019 1058 Gross per 24 hour  Intake 1177.31 ml  Output 550 ml  Net 627.31 ml   Last 3 Weights 06/10/2019 06/09/2019 06/08/2019  Weight (lbs) 208 lb 4.8 oz 207 lb 6.4 oz 207 lb 12.8 oz  Weight (kg) 94.484 kg 94.076 kg 94.257 kg      Telemetry    SB/SR with rates 50s-60s.- Personally Reviewed  ECG    Sinus bradycardia with 1st degree A-V block, 53 bpm, LAD, abnormal R wave progression-no significant changes from previous - Personally Reviewed  Physical Exam   Physical Exam: Blood pressure (!) 145/73, pulse 70,  temperature 97.8 F (36.6 C), temperature source Oral, resp. rate 15, height 6\' 2"  (1.88 m), weight 94.5 kg, SpO2 97 %.  GEN:  Well nourished, well developed in no acute distress HEENT: Normal NECK: No JVD; No carotid bruits LYMPHATICS: No lymphadenopathy CARDIAC: RRR  RESPIRATORY:  Clear to auscultation without rales, wheezing or rhonchi  ABDOMEN: Soft, non-tender, non-distended MUSCULOSKELETAL:  No edema; No deformity  SKIN: Warm and dry NEUROLOGIC:  Alert and oriented x 3   Labs    High Sensitivity Troponin:   Recent Labs  Lab 05/17/19 2352 05/18/19 0210 06/07/19 1947 06/07/19 2147  TROPONINIHS 20* 23* 34* 33*      Chemistry Recent Labs  Lab 06/07/19 1947 06/08/19 0300  NA 135 140  K 4.1 3.7  CL 104 106  CO2 19* 24  GLUCOSE 219* 161*  BUN 24* 21  CREATININE 1.20 1.13  CALCIUM 9.2 9.4  GFRNONAA 57* >60  GFRAA >60 >60  ANIONGAP 12 10     Hematology Recent Labs  Lab 06/07/19 1947 06/09/19 0508 06/10/19 0614  WBC 9.0 9.4 8.7  RBC 4.38 4.18* 4.06*  HGB 14.1 13.3 13.0  HCT 39.6 37.7* 37.2*  MCV 90.4 90.2 91.6  MCH 32.2 31.8  32.0  MCHC 35.6 35.3 34.9  RDW 11.7 11.8 11.9  PLT 279 249 254    BNPNo results for input(s): BNP, PROBNP in the last 168 hours.   DDimer No results for input(s): DDIMER in the last 168 hours.   Radiology    No results found.  Cardiac Studies   CORONARY STENT INTERVENTION  LEFT HEART CATH AND CORONARY ANGIOGRAPHY 05/18/2019  Conclusion   CULPRIT LESIONS: Dist RCA lesion is 90% stenosed with 90% stenosed side branch in RPDA. followed by RPDA lesion is 70% stenosed.  A drug-eluting stent was successfully placed covering the more distal segment in the PDA using a STENT RESOLUTE ONYX 2.5X22. Postdilated 2.6 mm  A second drug-eluting stent was successfully placed covering the proximal lesion into the distal RCA in overlapping the PDA stent, using a STENT RESOLUTE ONYX 2.5X22. Postdilated to 2.9 mm  2 Overlapping stents  used b/c unable to advance a 38 mm stent due to tortuous RCA & lack of Guide support.  Post intervention, there is a 0% residual stenosis.  Post intervention, the side branch was reduced to 0% residual stenosis.  -------------------------------------------------------------  Prox RCA to Mid RCA previously placed stents are ~5% stenosed.  Prox LAD to Mid LAD lesion is 30% stenosed. This is followed by mid LAD to [facility] LAD overlapping stents with 30% in-stent restenosis and with stable (from 2016) 70% ostial-proximal & 75% mid stenosis in side branch in 2nd Diag.  2nd Mrg lesion is 75% stenosed -- Not PCI target (stable from 2016)  -------------------------------------------------------------  There is mild left ventricular systolic dysfunction. The left ventricular ejection fraction is 45-50% by visual estimate. Mid anterior-apical anterior hypokinesis. LV end diastolic pressure is normal.   SUMMARY  Multivessel CAD involving RCA-PDA, LAD & 1st Diag as well as 1st Mrg. -->  Stable disease in the small side branches with mild LAD ISR and widely patent RCA stents.  CULPRIT LESION: Distal RCA-PDA 90% followed a 70% stenosis ? successfully treated with 2 overlapping Resolute Onyx DES stents (2.5 mm x 22 mm x 2 -postdilated from 2.9-2.6 mm)  Codominant system with relatively small caliber LAD and very large circumflex with LPLOM branch and RCA basically giving the RPDA that is a wraparound PDA covering the distal anterior apex  Mildly reduced LVEF with anterior hypokinesis -EF roughly 45 to 50%.  Normal LVEDP  RECOMMENDATIONS  Patient will return to nursing unit after procedure for TR band removal.  Anticipate discharge in the morning.  Was bolused with Brilinta here in the Cath Lab, will give 1 more dose of Brilinta tonight and convert to catheter goal in the morning 10/31 -> 300 mg clopidogrel written for (ensure that this is given, would then take 75 mg daily going forward    Continue aggressive risk factor modification  Follow-up with Dr. Delbert Harness, MD   Echocardiogram 05/18/2019 IMPRESSIONS   1. Left ventricular ejection fraction, by visual estimation, is 55 to 60%. The left ventricle has normal function. There is moderately increased left ventricular hypertrophy.  2. Entire anterior septum, mid anterior segment, and apex are abnormal.  3. Definity contrast agent was given IV to delineate the left ventricular endocardial borders.  4. Elevated left ventricular end-diastolic pressure.  5. Left ventricular diastolic parameters are consistent with Grade I diastolic dysfunction (impaired relaxation).  6. The left ventricle demonstrates regional wall motion abnormalities.  7. Global right ventricle has normal systolic function.The right ventricular size is normal. No increase in right ventricular wall  thickness.  8. Left atrial size was normal.  9. Right atrial size was normal. 10. The mitral valve is normal in structure. No evidence of mitral valve regurgitation. No evidence of mitral stenosis. 11. The tricuspid valve is normal in structure. Tricuspid valve regurgitation is not demonstrated. 12. The aortic valve is normal in structure. Aortic valve regurgitation is not visualized. No evidence of aortic valve sclerosis or stenosis. 13. The pulmonic valve was normal in structure. Pulmonic valve regurgitation is not visualized. 14. The inferior vena cava is normal in size with greater than 50% respiratory variability, suggesting right atrial pressure of 3 mmHg. 15. Hypokinesis consistent with ischemia in the LAD territory, overall LVEF is preserved because other walls are hyperdynamic.  Patient Profile     78 y.o. 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   Assessment & Plan    Coronary artery disease: The  patient has a history of stenting of his right coronary artery.  He had stenting to his RCA, LAD and left circumflex in the past.  He has a history of PAF and hypertension.    He presented to the hospital with episodes of chest pain that were identical to his presenting episodes of pain.  Troponin levels have been minimally elevated but the trend is flat.  He is scheduled for heart catheterization on Monday.  We discussed the risks, benefits and options of heart catheterization.  He understands and agrees to proceed.  2.  Hypertension: Blood pressure is minimally elevated.  Continue current medications.

## 2019-06-10 NOTE — Plan of Care (Signed)
  Problem: Education: Goal: Knowledge of General Education information will improve Description: Including pain rating scale, medication(s)/side effects and non-pharmacologic comfort measures Outcome: Progressing   Problem: Health Behavior/Discharge Planning: Goal: Ability to manage health-related needs will improve Outcome: Progressing   Problem: Activity: Goal: Risk for activity intolerance will decrease Outcome: Progressing   

## 2019-06-10 NOTE — Plan of Care (Signed)

## 2019-06-11 ENCOUNTER — Encounter (HOSPITAL_COMMUNITY)
Admission: EM | Disposition: A | Discharge status: Home / Self Care | Payer: Self-pay | Source: Home / Self Care | Attending: Cardiovascular Disease

## 2019-06-11 ENCOUNTER — Ambulatory Visit: Payer: Medicare Other | Admitting: Physician Assistant

## 2019-06-11 DIAGNOSIS — E785 Hyperlipidemia, unspecified: Secondary | ICD-10-CM

## 2019-06-11 DIAGNOSIS — I25110 Atherosclerotic heart disease of native coronary artery with unstable angina pectoris: Principal | ICD-10-CM

## 2019-06-11 DIAGNOSIS — R072 Precordial pain: Secondary | ICD-10-CM

## 2019-06-11 HISTORY — PX: LEFT HEART CATH AND CORONARY ANGIOGRAPHY: CATH118249

## 2019-06-11 LAB — CBC
HCT: 37.2 % — ABNORMAL LOW (ref 39.0–52.0)
Hemoglobin: 12.8 g/dL — ABNORMAL LOW (ref 13.0–17.0)
MCH: 31.6 pg (ref 26.0–34.0)
MCHC: 34.4 g/dL (ref 30.0–36.0)
MCV: 91.9 fL (ref 80.0–100.0)
Platelets: 256 10*3/uL (ref 150–400)
RBC: 4.05 MIL/uL — ABNORMAL LOW (ref 4.22–5.81)
RDW: 11.9 % (ref 11.5–15.5)
WBC: 8.8 10*3/uL (ref 4.0–10.5)
nRBC: 0 % (ref 0.0–0.2)

## 2019-06-11 LAB — GLUCOSE, CAPILLARY
Glucose-Capillary: 135 mg/dL — ABNORMAL HIGH (ref 70–99)
Glucose-Capillary: 136 mg/dL — ABNORMAL HIGH (ref 70–99)
Glucose-Capillary: 144 mg/dL — ABNORMAL HIGH (ref 70–99)
Glucose-Capillary: 173 mg/dL — ABNORMAL HIGH (ref 70–99)
Glucose-Capillary: 174 mg/dL — ABNORMAL HIGH (ref 70–99)
Glucose-Capillary: 248 mg/dL — ABNORMAL HIGH (ref 70–99)

## 2019-06-11 LAB — BASIC METABOLIC PANEL
Anion gap: 10 (ref 5–15)
BUN: 31 mg/dL — ABNORMAL HIGH (ref 8–23)
CO2: 20 mmol/L — ABNORMAL LOW (ref 22–32)
Calcium: 9 mg/dL (ref 8.9–10.3)
Chloride: 107 mmol/L (ref 98–111)
Creatinine, Ser: 1.27 mg/dL — ABNORMAL HIGH (ref 0.61–1.24)
GFR calc Af Amer: >60 mL/min (ref >60)
GFR calc non Af Amer: 53 mL/min — ABNORMAL LOW (ref >60)
Glucose, Bld: 140 mg/dL — ABNORMAL HIGH (ref 70–99)
Potassium: 4.1 mmol/L (ref 3.5–5.1)
Sodium: 137 mmol/L (ref 135–145)

## 2019-06-11 LAB — APTT: aPTT: 102 seconds — ABNORMAL HIGH (ref 24–36)

## 2019-06-11 LAB — HEPARIN LEVEL (UNFRACTIONATED): Heparin Unfractionated: 0.75 IU/mL — ABNORMAL HIGH (ref 0.30–0.70)

## 2019-06-11 SURGERY — LEFT HEART CATH AND CORONARY ANGIOGRAPHY
Anesthesia: LOCAL

## 2019-06-11 MED ORDER — IOHEXOL 350 MG/ML SOLN
INTRAVENOUS | Status: DC | PRN
  Administered 2019-06-11: 45 mL

## 2019-06-11 MED ORDER — ENOXAPARIN SODIUM 40 MG/0.4ML ~~LOC~~ SOLN
40.0000 mg | SUBCUTANEOUS | Status: DC
  Administered 2019-06-12: 40 mg via SUBCUTANEOUS
  Filled 2019-06-11: qty 0.4

## 2019-06-11 MED ORDER — LABETALOL HCL 5 MG/ML IV SOLN: 10.0000 mg | INTRAVENOUS | Status: AC | PRN

## 2019-06-11 MED ORDER — SODIUM CHLORIDE 0.9% FLUSH: 3.0000 mL | INTRAVENOUS | Status: DC | PRN

## 2019-06-11 MED ORDER — LIDOCAINE HCL (PF) 1 % IJ SOLN
INTRAMUSCULAR | Status: AC
  Filled 2019-06-11: qty 30

## 2019-06-11 MED ORDER — MIDAZOLAM HCL 2 MG/2ML IJ SOLN
INTRAMUSCULAR | Status: AC
  Filled 2019-06-11: qty 2

## 2019-06-11 MED ORDER — LIDOCAINE HCL (PF) 1 % IJ SOLN
INTRAMUSCULAR | Status: DC | PRN
  Administered 2019-06-11: 2 mL via INTRADERMAL

## 2019-06-11 MED ORDER — SODIUM CHLORIDE 0.9% FLUSH: 3.0000 mL | Freq: Two times a day (BID) | INTRAVENOUS | Status: DC

## 2019-06-11 MED ORDER — SODIUM CHLORIDE 0.9 % IV SOLN: 250.0000 mL | INTRAVENOUS | Status: DC | PRN

## 2019-06-11 MED ORDER — FENTANYL CITRATE (PF) 100 MCG/2ML IJ SOLN
INTRAMUSCULAR | Status: DC | PRN
  Administered 2019-06-11: 25 ug via INTRAVENOUS

## 2019-06-11 MED ORDER — HEPARIN (PORCINE) IN NACL 1000-0.9 UT/500ML-% IV SOLN
INTRAVENOUS | Status: AC
  Filled 2019-06-11: qty 1000

## 2019-06-11 MED ORDER — HEPARIN (PORCINE) IN NACL 1000-0.9 UT/500ML-% IV SOLN
INTRAVENOUS | Status: DC | PRN
  Administered 2019-06-11 (×2): 500 mL

## 2019-06-11 MED ORDER — HEPARIN SODIUM (PORCINE) 1000 UNIT/ML IJ SOLN
INTRAMUSCULAR | Status: DC | PRN
  Administered 2019-06-11: 5000 [IU] via INTRAVENOUS

## 2019-06-11 MED ORDER — VERAPAMIL HCL 2.5 MG/ML IV SOLN
INTRAVENOUS | Status: DC | PRN
  Administered 2019-06-11: 10 mL via INTRA_ARTERIAL

## 2019-06-11 MED ORDER — MIDAZOLAM HCL 2 MG/2ML IJ SOLN
INTRAMUSCULAR | Status: DC | PRN
  Administered 2019-06-11: 1 mg via INTRAVENOUS

## 2019-06-11 MED ORDER — SODIUM CHLORIDE 0.9 % IV SOLN
INTRAVENOUS | Status: AC
  Administered 2019-06-11: 14:00:00 via INTRAVENOUS

## 2019-06-11 MED ORDER — HYDRALAZINE HCL 20 MG/ML IJ SOLN: 10.0000 mg | INTRAMUSCULAR | Status: AC | PRN

## 2019-06-11 MED ORDER — HEPARIN SODIUM (PORCINE) 1000 UNIT/ML IJ SOLN
INTRAMUSCULAR | Status: AC
  Filled 2019-06-11: qty 1

## 2019-06-11 MED ORDER — RANOLAZINE ER 500 MG PO TB12
500.0000 mg | ORAL_TABLET | Freq: Two times a day (BID) | ORAL | Status: DC
  Administered 2019-06-11 – 2019-06-12 (×3): 500 mg via ORAL
  Filled 2019-06-11 (×3): qty 1

## 2019-06-11 MED ORDER — FENTANYL CITRATE (PF) 100 MCG/2ML IJ SOLN
INTRAMUSCULAR | Status: AC
  Filled 2019-06-11: qty 2

## 2019-06-11 MED ORDER — VERAPAMIL HCL 2.5 MG/ML IV SOLN
INTRAVENOUS | Status: AC
  Filled 2019-06-11: qty 2

## 2019-06-11 SURGICAL SUPPLY — 10 items
CATH 5FR JL3.5 JR4 ANG PIG MP (CATHETERS) ×1 IMPLANT
DEVICE RAD COMP TR BAND LRG (VASCULAR PRODUCTS) ×1 IMPLANT
GLIDESHEATH SLEND SS 6F .021 (SHEATH) ×1 IMPLANT
GUIDEWIRE INQWIRE 1.5J.035X260 (WIRE) IMPLANT
INQWIRE 1.5J .035X260CM (WIRE) ×2
KIT HEART LEFT (KITS) ×2 IMPLANT
PACK CARDIAC CATHETERIZATION (CUSTOM PROCEDURE TRAY) ×2 IMPLANT
SHEATH PROBE COVER 6X72 (BAG) ×1 IMPLANT
TRANSDUCER W/STOPCOCK (MISCELLANEOUS) ×2 IMPLANT
TUBING CIL FLEX 10 FLL-RA (TUBING) ×2 IMPLANT

## 2019-06-11 NOTE — Progress Notes (Signed)
Patient refusing morning medicines, says she will take them with her breakfast. Also, patient refuses to get weighed this morning, says she will get up for a weight after breakfast. 

## 2019-06-11 NOTE — Progress Notes (Signed)
TR band removed.

## 2019-06-11 NOTE — Progress Notes (Deleted)
x

## 2019-06-11 NOTE — Interval H&P Note (Signed)
History and Physical Interval Note:  06/11/2019 12:33 PM  Joseph [name]  has presented today for cardiac catheterization, with the diagnosis of chest pain.  The various methods of treatment have been discussed with the patient and family. After consideration of risks, benefits and other options for treatment, the patient has consented to  Procedure(s): LEFT HEART CATH AND CORONARY ANGIOGRAPHY (N/A) as a surgical intervention.  The patient's history has been reviewed, patient examined, no change in status, stable for surgery.  I have reviewed the patient's chart and labs.  Questions were answered to the patient's satisfaction.    Cath Lab Visit (complete for each Cath Lab visit)  Clinical Evaluation Leading to the Procedure:   ACS: Yes.   (unstable angina)  Non-ACS:  N/A  [name]

## 2019-06-11 NOTE — Progress Notes (Signed)
Progress Note  [name] Date of Encounter: 06/11/2019  Primary Cardiologist: Mertie Moores, MD   Subjective   No chest pain or dyspnea  Inpatient Medications    Scheduled Meds: . amLODipine  10 mg Oral Daily  . aspirin EC  81 mg Oral Daily  . carvedilol  3.125 mg Oral BID  . clopidogrel  75 mg Oral Daily  . insulin aspart  0-5 Units Subcutaneous QHS  . insulin aspart  0-9 Units Subcutaneous TID WC  . insulin glargine  10 Units Subcutaneous Daily  . isosorbide mononitrate  120 mg Oral Daily  . lisinopril  20 mg Oral Daily  . sodium chloride flush  3 mL Intravenous Q12H   Continuous Infusions: . sodium chloride    . sodium chloride 1 mL/kg/hr (06/11/19 0123)  . heparin 1,250 Units/hr (06/10/19 2150)   PRN Meds: sodium chloride, acetaminophen, fluticasone, nitroGLYCERIN, nitroGLYCERIN, sodium chloride flush   Vital Signs    Vitals:   06/10/19 2152 06/10/19 2351 06/11/19 0351 06/11/19 0755  BP: 127/69 125/65 126/69 132/71  Pulse: (!) 58 (!) 53 (!) 56 (!) 50  Resp:  18 16 18   Temp:  97.7 F (36.5 C) 98.4 F (36.9 C) 97.8 F (36.6 C)  TempSrc:  Oral Oral Oral  SpO2:  97% 98% 98%  Weight:   94.3 kg   Height:        Intake/Output Summary (Last 24 hours) at 06/11/2019 0850 Last data filed at 06/11/2019 0756 Gross per 24 hour  Intake 1643.13 ml  Output 1800 ml  Net -156.87 ml   Last 3 Weights 06/11/2019 06/10/2019 06/09/2019  Weight (lbs) 207 lb 12.8 oz 208 lb 4.8 oz 207 lb 6.4 oz  Weight (kg) 94.257 kg 94.484 kg 94.076 kg      Telemetry    Sinus - Personally Reviewed   Physical Exam   GEN: No acute distress.   Neck: No JVD Cardiac: RRR, no murmurs, rubs, or gallops.  Respiratory: Clear to auscultation bilaterally. GI: Soft, nontender, non-distended  MS: No edema Neuro:  Nonfocal  Psych: Normal affect   Labs    High Sensitivity Troponin:   Recent Labs  Lab 05/17/19 2352 05/18/19 0210 06/07/19 1947 06/07/19 2147   TROPONINIHS 20* 23* 34* 33*      Chemistry Recent Labs  Lab 06/07/19 1947 06/08/19 0300 06/11/19 0454  NA 135 140 137  K 4.1 3.7 4.1  CL 104 106 107  CO2 19* 24 20*  GLUCOSE 219* 161* 140*  BUN 24* 21 31*  CREATININE 1.20 1.13 1.27*  CALCIUM 9.2 9.4 9.0  GFRNONAA 57* >60 53*  GFRAA >60 >60 >60  ANIONGAP 12 10 10      Hematology Recent Labs  Lab 06/09/19 0508 06/10/19 0614 06/11/19 0358  WBC 9.4 8.7 8.8  RBC 4.18* 4.06* 4.05*  HGB 13.3 13.0 12.8*  HCT 37.7* 37.2* 37.2*  MCV 90.2 91.6 91.9  MCH 31.8 32.0 31.6  MCHC 35.3 34.9 34.4  RDW 11.8 11.9 11.9  PLT 249 254 256    Patient Profile     78 y.o. 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  Assessment & Plan    1 unstable angina-patient presented with symptoms identical to those prior to recent PCI.  Enzymes minimally elevated but flat.  Plan is for cardiac catheterization today.  The risks  and benefits including myocardial infarction, CVA and death discussed and he agrees to proceed.  Continue aspirin, plavix (recent PCI), heparin and beta-blocker.  2 hyperlipidemia-patient is intolerant to statins.  He is on a cholesterol study medication.  3 hypertension-blood pressure controlled.  Continue present medications and follow.  4 history of paroxysmal atrial fibrillation-he is in sinus rhythm.  We will resume apixaban following all procedures.  For questions or updates, please contact [facility] Please consult [email or web address] for contact info under        Signed, Olga Millers, MD  06/11/2019, 8:50 AM

## 2019-06-11 NOTE — H&P (View-Only) (Signed)
Progress Note  [name] Date of Encounter: 06/11/2019  Primary Cardiologist: Mertie Moores, MD   Subjective   No chest pain or dyspnea  Inpatient Medications    Scheduled Meds: . amLODipine  10 mg Oral Daily  . aspirin EC  81 mg Oral Daily  . carvedilol  3.125 mg Oral BID  . clopidogrel  75 mg Oral Daily  . insulin aspart  0-5 Units Subcutaneous QHS  . insulin aspart  0-9 Units Subcutaneous TID WC  . insulin glargine  10 Units Subcutaneous Daily  . isosorbide mononitrate  120 mg Oral Daily  . lisinopril  20 mg Oral Daily  . sodium chloride flush  3 mL Intravenous Q12H   Continuous Infusions: . sodium chloride    . sodium chloride 1 mL/kg/hr (06/11/19 0123)  . heparin 1,250 Units/hr (06/10/19 2150)   PRN Meds: sodium chloride, acetaminophen, fluticasone, nitroGLYCERIN, nitroGLYCERIN, sodium chloride flush   Vital Signs    Vitals:   06/10/19 2152 06/10/19 2351 06/11/19 0351 06/11/19 0755  BP: 127/69 125/65 126/69 132/71  Pulse: (!) 58 (!) 53 (!) 56 (!) 50  Resp:  18 16 18   Temp:  97.7 F (36.5 C) 98.4 F (36.9 C) 97.8 F (36.6 C)  TempSrc:  Oral Oral Oral  SpO2:  97% 98% 98%  Weight:   94.3 kg   Height:        Intake/Output Summary (Last 24 hours) at 06/11/2019 0850 Last data filed at 06/11/2019 0756 Gross per 24 hour  Intake 1643.13 ml  Output 1800 ml  Net -156.87 ml   Last 3 Weights 06/11/2019 06/10/2019 06/09/2019  Weight (lbs) 207 lb 12.8 oz 208 lb 4.8 oz 207 lb 6.4 oz  Weight (kg) 94.257 kg 94.484 kg 94.076 kg      Telemetry    Sinus - Personally Reviewed   Physical Exam   GEN: No acute distress.   Neck: No JVD Cardiac: RRR, no murmurs, rubs, or gallops.  Respiratory: Clear to auscultation bilaterally. GI: Soft, nontender, non-distended  MS: No edema Neuro:  Nonfocal  Psych: Normal affect   Labs    High Sensitivity Troponin:   Recent Labs  Lab 05/17/19 2352 05/18/19 0210 06/07/19 1947 06/07/19 2147   TROPONINIHS 20* 23* 34* 33*      Chemistry Recent Labs  Lab 06/07/19 1947 06/08/19 0300 06/11/19 0454  NA 135 140 137  K 4.1 3.7 4.1  CL 104 106 107  CO2 19* 24 20*  GLUCOSE 219* 161* 140*  BUN 24* 21 31*  CREATININE 1.20 1.13 1.27*  CALCIUM 9.2 9.4 9.0  GFRNONAA 57* >60 53*  GFRAA >60 >60 >60  ANIONGAP 12 10 10      Hematology Recent Labs  Lab 06/09/19 0508 06/10/19 0614 06/11/19 0358  WBC 9.4 8.7 8.8  RBC 4.18* 4.06* 4.05*  HGB 13.3 13.0 12.8*  HCT 37.7* 37.2* 37.2*  MCV 90.2 91.6 91.9  MCH 31.8 32.0 31.6  MCHC 35.3 34.9 34.4  RDW 11.8 11.9 11.9  PLT 249 254 256    Patient Profile     78 y.o. 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  Assessment & Plan    1 unstable angina-patient presented with symptoms identical to those prior to recent PCI.  Enzymes minimally elevated but flat.  Plan is for cardiac catheterization today.  The risks  and benefits including myocardial infarction, CVA and death discussed and he agrees to proceed.  Continue aspirin, plavix (recent PCI), heparin and beta-blocker.  2 hyperlipidemia-patient is intolerant to statins.  He is on a cholesterol study medication.  3 hypertension-blood pressure controlled.  Continue present medications and follow.  4 history of paroxysmal atrial fibrillation-he is in sinus rhythm.  We will resume apixaban following all procedures.  For questions or updates, please contact [facility] Please consult [email or web address] for contact info under        Signed, Olga Millers, MD  06/11/2019, 8:50 AM

## 2019-06-11 NOTE — Progress Notes (Signed)
Patient says he takes an "experimental cholesterol injection every 3 months". Patient has questions about whether or not this is affecting his heart and causing his chest pain. Please advise. Thanks

## 2019-06-11 NOTE — Care Management Important Message (Signed)
Important Message  Patient Details  Name: Joseph [name] MRN: [id number] Date of Birth: 03/07/1941   Medicare Important Message Given:  Yes     Shelda Altes 06/11/2019, 11:46 AM

## 2019-06-11 NOTE — Progress Notes (Signed)
Per Barrett PA, do not give morning dose of carvedilol due to heart rate in the 50's.

## 2019-06-11 NOTE — Brief Op Note (Signed)
BRIEF CARDIAC CATHETERIZATION NOTE  DATE: 06/11/2019  TIME: 1:30 PM  PATIENT:  Joseph [name]  78 y.o. male  PRE-OPERATIVE DIAGNOSIS:  Unstable angina  POST-OPERATIVE DIAGNOSIS:  Unstable angina  PROCEDURE:  Procedure(s): LEFT HEART CATH AND CORONARY ANGIOGRAPHY (N/A)  SURGEON:  Surgeon(s) and Role:    * Adrienna Karis, MD - Primary  FINDINGS: 1. Multivessel CAD, not significantly changed from prior catheterization on 05/18/2019. 2. LAD stent is patent with mild to moderate ISR. 3. LCx stent is patent with minimal ISR. 4. Recently placed overlapping distal RCA/rPDA stents are widely patent. 5. Normal LVEDP.  RECOMMENDATIONS: 1. Films reviewed with interventional team.  We have agreed to continued escalation of medical therapy, as targets are suboptimal for percutaneous intervention and CABG. 2. Start ranolazine 500 mg BID.  Continue other antianginal therapy. 3. Continue clopidogrel.  If no bleeding complications, apixaban can be restarted tomorrow and aspirin discontinued. 4. Aggressive secondary prevention.  Nelva Bush, MD [facility] Pager: [phone]

## 2019-06-11 NOTE — TOC Benefit Eligibility Note (Signed)
Transition of Care [facility]) Benefit Eligibility Note    Patient Details  Name: Joseph [name] MRN: [id number] Date of Birth: February 16, 1941   Medication/Dose: RANOLAZINE ( RANEXA ) 12 HR TABLET  500 MG BID  Covered?: Yes  Tier: (TIER 4 DRUG)  Prescription Coverage Preferred Pharmacy: [facility]     AND    Jackquline [place] M/O  Spoke with Person/Company/Phone Number:: HERA  @ [employer] # [phone] OPT- 2  Co-Pay: $ 5.23  Prior Approval: No  Deductible: Met  Additional Notes: 90 DAY SUPPLY FOR M/O $ 15.40    AND   90 DAY SUPPLY FOR RETAIL $15.50    Memory Argue Phone Number: 06/11/2019, 5:18 PM

## 2019-06-12 ENCOUNTER — Telehealth: Payer: Self-pay | Admitting: Cardiovascular Disease

## 2019-06-12 ENCOUNTER — Encounter (HOSPITAL_COMMUNITY): Payer: Self-pay | Admitting: Internal Medicine

## 2019-06-12 ENCOUNTER — Other Ambulatory Visit: Payer: Self-pay | Admitting: Pharmacist

## 2019-06-12 DIAGNOSIS — I2510 Atherosclerotic heart disease of native coronary artery without angina pectoris: Secondary | ICD-10-CM

## 2019-06-12 HISTORY — DX: Atherosclerotic heart disease of native coronary artery without angina pectoris: I25.10

## 2019-06-12 LAB — APTT: aPTT: 29 seconds (ref 24–36)

## 2019-06-12 LAB — CBC
HCT: 35 % — ABNORMAL LOW (ref 39.0–52.0)
Hemoglobin: 12.1 g/dL — ABNORMAL LOW (ref 13.0–17.0)
MCH: 31.3 pg (ref 26.0–34.0)
MCHC: 34.6 g/dL (ref 30.0–36.0)
MCV: 90.7 fL (ref 80.0–100.0)
Platelets: 225 10*3/uL (ref 150–400)
RBC: 3.86 MIL/uL — ABNORMAL LOW (ref 4.22–5.81)
RDW: 12 % (ref 11.5–15.5)
WBC: 8.4 10*3/uL (ref 4.0–10.5)
nRBC: 0 % (ref 0.0–0.2)

## 2019-06-12 LAB — BASIC METABOLIC PANEL
Anion gap: 9 (ref 5–15)
BUN: 30 mg/dL — ABNORMAL HIGH (ref 8–23)
CO2: 22 mmol/L (ref 22–32)
Calcium: 9.1 mg/dL (ref 8.9–10.3)
Chloride: 107 mmol/L (ref 98–111)
Creatinine, Ser: 1.3 mg/dL — ABNORMAL HIGH (ref 0.61–1.24)
GFR calc Af Amer: 60 mL/min — ABNORMAL LOW (ref >60)
GFR calc non Af Amer: 52 mL/min — ABNORMAL LOW (ref >60)
Glucose, Bld: 165 mg/dL — ABNORMAL HIGH (ref 70–99)
Potassium: 4.3 mmol/L (ref 3.5–5.1)
Sodium: 138 mmol/L (ref 135–145)

## 2019-06-12 LAB — GLUCOSE, CAPILLARY
Glucose-Capillary: 147 mg/dL — ABNORMAL HIGH (ref 70–99)
Glucose-Capillary: 189 mg/dL — ABNORMAL HIGH (ref 70–99)

## 2019-06-12 MED ORDER — APIXABAN 5 MG PO TABS
5.0000 mg | ORAL_TABLET | Freq: Two times a day (BID) | ORAL | Status: DC
  Administered 2019-06-12: 5 mg via ORAL
  Filled 2019-06-12: qty 1

## 2019-06-12 MED ORDER — NITROGLYCERIN 0.4 MG SL SUBL: 0.4000 mg | SUBLINGUAL_TABLET | SUBLINGUAL | 3 refills | Status: DC | PRN

## 2019-06-12 MED ORDER — RANOLAZINE ER 500 MG PO TB12: 500.0000 mg | ORAL_TABLET | Freq: Two times a day (BID) | ORAL | 6 refills | Status: DC

## 2019-06-12 NOTE — Progress Notes (Signed)
CARDIAC REHAB PHASE I   PRE:  Rate/Rhythm: 60 SR  BP:  Sitting: 141/77      SaO2: 95 RA  MODE:  Ambulation: 470 ft   POST:  Rate/Rhythm: 90 SR  BP:  Sitting: 160/69    SaO2: 97 RA  Pt ambulated 46ft in hallway independently with steady gait. Pt denies CP, SOB, or dizziness. Reviewed site care and restrictions. Encouraged continued ambulation. Pt educated on risk factors and modification. Pt denies further questions or concerns.   [id number] Rufina Falco, RN BSN 06/12/2019 9:14 AM

## 2019-06-12 NOTE — Plan of Care (Signed)
  Problem: Education: Goal: Knowledge of General Education information will improve Description: Including pain rating scale, medication(s)/side effects and non-pharmacologic comfort measures Outcome: Progressing   Problem: Health Behavior/Discharge Planning: Goal: Ability to manage health-related needs will improve Outcome: Progressing   Problem: Clinical Measurements: Goal: Diagnostic test results will improve Outcome: Progressing   Problem: Clinical Measurements: Goal: Ability to maintain clinical measurements within normal limits will improve Outcome: Progressing

## 2019-06-12 NOTE — Telephone Encounter (Addendum)
The pt is being discharged today. We will call him tomorrow. 

## 2019-06-12 NOTE — Discharge Instructions (Signed)
Stop ASA 06/18/19   Do Not stop eliquis or plavix   Heart Healthy diabetic diet.    Call [facility] at [phone] if any bleeding, swelling or drainage at cath site.  May shower, no tub baths for 48 hours for groin sticks. No lifting over 5 pounds for 3 days.  No Driving for 3 days if you drive

## 2019-06-12 NOTE — Progress Notes (Signed)
Pt left message on my direct line (I have managed his lipids) asking for refill of NTG since his is expired. Pt discharged from hospital today with unstable angina, hx of CAD. Rx sent to pharmacy.

## 2019-06-12 NOTE — Progress Notes (Addendum)
Progress Note  [name] Date of Encounter: 06/12/2019  Primary Cardiologist: Joseph Miss, MD   Subjective   Pt denies CP or dyspnea  Inpatient Medications    Scheduled Meds: . amLODipine  10 mg Oral Daily  . aspirin EC  81 mg Oral Daily  . carvedilol  3.125 mg Oral BID  . clopidogrel  75 mg Oral Daily  . enoxaparin (LOVENOX) injection  40 mg Subcutaneous Q24H  . insulin aspart  0-5 Units Subcutaneous QHS  . insulin aspart  0-9 Units Subcutaneous TID WC  . insulin glargine  10 Units Subcutaneous Daily  . isosorbide mononitrate  120 mg Oral Daily  . lisinopril  20 mg Oral Daily  . ranolazine  500 mg Oral BID  . sodium chloride flush  3 mL Intravenous Q12H  . sodium chloride flush  3 mL Intravenous Q12H   Continuous Infusions: . sodium chloride     PRN Meds: sodium chloride, acetaminophen, fluticasone, nitroGLYCERIN, sodium chloride flush   Vital Signs    Vitals:   06/12/19 0421 06/12/19 0426 06/12/19 0726 06/12/19 0810  BP: 116/67  (!) 160/75 (!) 147/73  Pulse: (!) 56  66 (!) 58  Resp: 18  20   Temp: 98.1 F (36.7 C)  98.7 F (37.1 C)   TempSrc: Oral  Oral   SpO2: 96%  98% 97%  Weight:  95 kg    Height:        Intake/Output Summary (Last 24 hours) at 06/12/2019 0833 Last data filed at 06/12/2019 0800 Gross per 24 hour  Intake 1729.06 ml  Output 925 ml  Net 804.06 ml   Last 3 Weights 06/12/2019 06/11/2019 06/10/2019  Weight (lbs) 209 lb 8 oz 207 lb 12.8 oz 208 lb 4.8 oz  Weight (kg) 95.029 kg 94.257 kg 94.484 kg      Telemetry    Sinus - Personally Reviewed   Physical Exam   GEN: NAD Neck: supple Cardiac: RRR Respiratory: CTA; no wheeze GI: Soft, NT/ND MS: No edema; radial cath site with no hematoma Neuro:  Grossly intact Psych: Normal affect   Labs    High Sensitivity Troponin:   Recent Labs  Lab 05/17/19 2352 05/18/19 0210 06/07/19 1947 06/07/19 2147  TROPONINIHS 20* 23* 34* 33*      Chemistry Recent  Labs  Lab 06/08/19 0300 06/11/19 0454 06/12/19 0404  NA 140 137 138  K 3.7 4.1 4.3  CL 106 107 107  CO2 24 20* 22  GLUCOSE 161* 140* 165*  BUN 21 31* 30*  CREATININE 1.13 1.27* 1.30*  CALCIUM 9.4 9.0 9.1  GFRNONAA >60 53* 52*  GFRAA >60 >60 60*  ANIONGAP 10 10 9      Hematology Recent Labs  Lab 06/10/19 0614 06/11/19 0358 06/12/19 0404  WBC 8.7 8.8 8.4  RBC 4.06* 4.05* 3.86*  HGB 13.0 12.8* 12.1*  HCT 37.2* 37.2* 35.0*  MCV 91.6 91.9 90.7  MCH 32.0 31.6 31.3  MCHC 34.9 34.4 34.6  RDW 11.9 11.9 12.0  PLT 254 256 225    Patient Profile     78 y.o. 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  Assessment & Plan    1 unstable angina-cath results noted.  Plan is medical therapy.  Ranolazine added.  Would continue aspirin 81 mg daily, Plavix and resume apixaban at discharge given paroxysmal  atrial fibrillation.  Can discontinue aspirin on November 30 which will be 1 month following recent PCI and continue Plavix and apixaban thereafter.  2 hyperlipidemia-patient is intolerant to statins.  He is on a cholesterol study medication.  3 hypertension-blood pressure controlled.  Continue present medications and follow.  4 history of paroxysmal atrial fibrillation-he is in sinus rhythm.  Resume apixaban 5 mg twice daily.  Patient can be discharged home today.  Transition of care appointment with APP 1 week.  Check potassium, renal function and electrocardiogram at follow-up office visit 1 week.  Follow-up Dr. Acie [name] 12 weeks. Greater than 30 minutes PA and physician time. D2  For questions or updates, please contact [place] Please consult [email or web address] for contact info under        Signed, Joseph Ruths, MD  06/12/2019, 8:33 AM

## 2019-06-12 NOTE — Telephone Encounter (Signed)
New Message    Pt has TOC appt 07/04/19 @ 9am

## 2019-06-12 NOTE — Discharge Summary (Signed)
Discharge Summary    Patient ID: Joseph [name] MRN: [id number]; DOB: [birth year]  Admit date: 06/07/2019 Discharge date: 06/12/2019  Primary Care Provider: Dema Severin, NP  Primary Cardiologist: Kristeen Miss, MD  Primary Electrophysiologist:  None   Discharge Diagnoses    Principal Problem:   Unstable angina [facility]) Active Problems:   Paroxysmal atrial fibrillation (HCC)   CAD in native artery   Type 2 diabetes mellitus with vascular disease (HCC)   Essential hypertension   Anticoagulated   Hyperlipidemia   Chest pain    Diagnostic Studies/Procedures    Echo 06/08/19 IMPRESSIONS    1. Left ventricular ejection fraction, by visual estimation, is 55 to 60%. The left ventricle has normal function. There is moderately increased left ventricular hypertrophy.  2. Left ventricular diastolic parameters are indeterminate.  3. The left ventricle demonstrates regional wall motion abnormalities.  4. Anterior/septal wall motion appears similar to prior, mildy hypokinetic. Basal/mid inferior wall mildly hypokinetic to me, similar to prior study (but not previously called).  5. Global right ventricle has normal systolic function.The right ventricular size is normal. No increase in right ventricular wall thickness.  6. Left atrial size was normal.  7. Right atrial size was normal.  8. The mitral valve is normal in structure. No evidence of mitral valve regurgitation. No evidence of mitral stenosis.  9. The tricuspid valve is normal in structure. Tricuspid valve regurgitation is not demonstrated. 10. The aortic valve is tricuspid. Aortic valve regurgitation is not visualized. No evidence of aortic valve sclerosis or stenosis. 11. The pulmonic valve was grossly normal. Pulmonic valve regurgitation is not visualized. 12. Aortic dilatation noted. 13. There is mild dilatation of the ascending aorta measuring 41 mm. 14. Hypokinesis consistent with ischemia in the LAD territory, overall  LVEF is preserved because other walls are hyperdynamic.  FINDINGS  Left Ventricle: Left ventricular ejection fraction, by visual estimation, is 55 to 60%. The left ventricle has normal function. The left ventricle demonstrates regional wall motion abnormalities. There is moderately increased left ventricular  hypertrophy. Concentric left ventricular hypertrophy. Left ventricular diastolic parameters are indeterminate. Anterior/septal wall motion appears similar to prior, mildy hypokinetic. Basal/mid inferior wall mildly hypokinetic to me, similar to prior  study (but not previously called).  Right Ventricle: The right ventricular size is normal. No increase in right ventricular wall thickness. Global RV systolic function is has normal systolic function.  Left Atrium: Left atrial size was normal in size.  Right Atrium: Right atrial size was normal in size  Pericardium: There is no evidence of pericardial effusion.  Mitral Valve: The mitral valve is normal in structure. No evidence of mitral valve stenosis by observation. No evidence of mitral valve regurgitation.  Tricuspid Valve: The tricuspid valve is normal in structure. Tricuspid valve regurgitation is not demonstrated.  Aortic Valve: The aortic valve is tricuspid. Aortic valve regurgitation is not visualized. The aortic valve is structurally normal, with no evidence of sclerosis or stenosis.  Pulmonic Valve: The pulmonic valve was grossly normal. Pulmonic valve regurgitation is not visualized.  Aorta: Aortic dilatation noted. There is mild dilatation of the ascending aorta measuring 41 mm.  Pulmonary Artery: The pulmonary artery is not well seen.  Venous: The inferior vena cava was not well visualized.  IAS/Shunts: No atrial level shunt detected by color flow Doppler. No ventricular septal defect is seen or detected. There is no evidence of an atrial septal defect.   Cardiac cath 06/11/19   Conclusions: 1. Multivessel coronary artery disease, not  significantly changed compared with prior catheterization on 05/18/2019. 2. Patent LAD, LCx, and RCA stents.  The LAD stent(s) demonstrate moderate in-stent restenosis. 3. Normal left ventricular filling pressure.  Recommendations: 1. Films reviewed with interventional team. We have agreed to continued escalation of medical therapy, as targets are suboptimal for percutaneous intervention and CABG. 2. Start ranolazine 500 mg BID. Continue other antianginal therapy. 3. Continue clopidogrel. If no bleeding complications, apixaban can be restarted tomorrow and aspirin discontinued. 4. Aggressive secondary prevention.  _____________   History of Present Illness     Joseph [name] is a 78 y.o. male with multivessel CAD (s/p PCI to RCA, LAD, LCx most recently 04/2019 to RCA/PDA), pAF, HTN, and DM2 who presents with escalating chest discomfort and SOB 06/07/19 following recent PCI (05/18/19) Culprit lesion felt to be RCA-PDA for which 2 overlapping DES were deployed. He was discharged home on DAPT the following day and continued to feel well - angina free - for 10 days.   He then began having profound weakness and SOB associated with sharp lt sided CP.     In the ER, initial EKG SB without ST changes. Initial hsTn 34. CXR unremarkable. Patient continuing to have episodes of discomfort while in ED.  His eliquis for PAF was held and placed on IV heparin.  With CHA2DS2VASc of 7,  Plan for cardiac cath.  Troponin 34;33   K+ 4.1 Cr 1.20 and Hgb 14.    CXR No active disease.  Admitted to progressive care.     Hospital Course     Consultants: none   Cath held on the 20th due to timing of eliquis.  Pt on SSI   He continued on IV heparin and cath for Monday.  He did well over the weekend and had cath 06/11/19.   Stable cath with plan to begin Ranexa and treat medically.     Pt is intolerant to statins on cholesterol study drug.  BP has been  controlled and he has maintained SR.  Will resume Eliqis today.  And continue plavix and asa. Plan for stopping ASA 06/18/19 which is 1 month following 05/18/19 PCI.    Pt ambulated with cardiac rehab and was seen and evaluated by Dr. Jens Somrenshaw found stable for discharge.    Did the patient have an acute coronary syndrome (MI, NSTEMI, STEMI, etc) this admission?:  No                               Did the patient have a percutaneous coronary intervention (stent / angioplasty)?:  No.   _____________  Discharge Vitals Blood pressure (!) 147/73, pulse (!) 58, temperature 98.7 F (37.1 C), temperature source Oral, resp. rate 20, height 6\' 2"  (1.88 m), weight 95 kg, SpO2 97 %.  Filed Weights   06/10/19 0534 06/11/19 0351 06/12/19 0426  Weight: 94.5 kg 94.3 kg 95 kg    Labs & Radiologic Studies    CBC Recent Labs    06/11/19 0358 06/12/19 0404  WBC 8.8 8.4  HGB 12.8* 12.1*  HCT 37.2* 35.0*  MCV 91.9 90.7  PLT 256 225   Basic Metabolic Panel Recent Labs    40/98/1111/23/20 0454 06/12/19 0404  NA 137 138  K 4.1 4.3  CL 107 107  CO2 20* 22  GLUCOSE 140* 165*  BUN 31* 30*  CREATININE 1.27* 1.30*  CALCIUM 9.0 9.1   Liver Function Tests No results for input(s): AST,  ALT, ALKPHOS, BILITOT, PROT, ALBUMIN in the last 72 hours. No results for input(s): LIPASE, AMYLASE in the last 72 hours. High Sensitivity Troponin:   Recent Labs  Lab 05/17/19 2352 05/18/19 0210 06/07/19 1947 06/07/19 2147  TROPONINIHS 20* 23* 34* 33*    BNP Invalid input(s): POCBNP D-Dimer No results for input(s): DDIMER in the last 72 hours. Hemoglobin A1C No results for input(s): HGBA1C in the last 72 hours. Fasting Lipid Panel No results for input(s): CHOL, HDL, LDLCALC, TRIG, CHOLHDL, LDLDIRECT in the last 72 hours. Thyroid Function Tests No results for input(s): TSH, T4TOTAL, T3FREE, THYROIDAB in the last 72 hours.  Invalid input(s): FREET3 _____________  Dg Chest Portable 1 View  Result Date:  06/07/2019 CLINICAL DATA:  Chest pain EXAM: PORTABLE CHEST 1 VIEW COMPARISON:  05/17/2019 FINDINGS: Heart and mediastinal contours are within normal limits. No focal opacities or effusions. No acute bony abnormality. IMPRESSION: No active disease. Electronically Signed   By: Charlett Nose M.D.   On: 06/07/2019 20:03   Disposition   Pt is being discharged home today in good condition.  Follow-up Plans & Appointments    Follow-up Information    Nahser, Deloris Ping, MD Follow up on 06/22/2019.   Specialty: Cardiology Why: at 0900 AM Contact information: [address] [place] [address] [phone]           Stop ASA 06/18/19   Do Not stop eliquis or plavix   Heart Healthy diabetic diet.    Call [facility] at [phone] if any bleeding, swelling or drainage at cath site.  May shower, no tub baths for 48 hours for groin sticks. No lifting over 5 pounds for 3 days.  No Driving for 3 days if you drive     Discharge Medications   Allergies as of 06/12/2019      Reactions   Bempedoic Acid Other (See Comments)   NEXLETOL Myalgias, GI upset   [name] [alirocumab] Other (See Comments)   Muscle weakness and rash   Rosuvastatin Other (See Comments)   Low energy/leg and hip pain   Zetia [ezetimibe] Other (See Comments)   myalgias      Medication List    TAKE these medications   acetaminophen 650 MG CR tablet Commonly known as: TYLENOL Take 1,300 mg by mouth every 8 (eight) hours as needed for pain.   amLODipine 10 MG tablet Commonly known as: NORVASC Take 10 mg by mouth daily.   apixaban 5 MG Tabs tablet Commonly known as: ELIQUIS Take 1 tablet (5 mg total) by mouth 2 (two) times daily. DO NOT TAKE FOR 3 MORE DAYS. MAY RESTART TAKING IT ON 01/01/2018. What changed: additional instructions   aspirin 81 MG EC tablet Take 1 tablet (81 mg total) by mouth daily.   carvedilol 3.125 MG tablet Commonly known as: COREG Take 3.125  mg by mouth 2 (two) times daily.   clopidogrel 75 MG tablet Commonly known as: PLAVIX Take 1 tablet (75 mg total) by mouth daily.   Coenzyme Q10 100 MG capsule Take 1 capsule by mouth daily.   diltiazem 30 MG tablet Commonly known as: Cardizem Take 1 tablet (30 mg total) by mouth as needed (Rapid Palpitations).   Fish Oil 1000 MG Caps Take 4 capsules (4,000 mg total) by mouth daily. What changed: how much to take   fluticasone 50 MCG/ACT nasal spray Commonly known as: FLONASE Place 2 sprays into both nostrils daily as needed for allergies or rhinitis.  GARLIC PO Take 1 capsule by mouth 2 (two) times daily.   glipiZIDE 10 MG tablet Commonly known as: GLUCOTROL Take 10 mg by mouth 2 (two) times daily before a meal.   hydroxypropyl methylcellulose / hypromellose 2.5 % ophthalmic solution Commonly known as: ISOPTO TEARS / GONIOVISC Place 1 drop into both eyes 3 (three) times daily as needed for dry eyes.   Insulin Degludec 100 UNIT/ML Soln Inject 22 Units into the skin every morning.   isosorbide mononitrate 120 MG 24 hr tablet Commonly known as: IMDUR Take 1 tablet (120 mg total) by mouth daily.   lisinopril 20 MG tablet Commonly known as: ZESTRIL Take 1 tablet (20 mg total) by mouth daily.   nitroGLYCERIN 0.4 MG SL tablet Commonly known as: NITROSTAT Place 0.4 mg under the tongue every 5 (five) minutes as needed for chest pain (x 3 doses).   ranolazine 500 MG 12 hr tablet Commonly known as: RANEXA Take 1 tablet (500 mg total) by mouth 2 (two) times daily.   triamcinolone cream 0.5 % Commonly known as: KENALOG Apply 1 application topically as needed for irritation.          Outstanding Labs/Studies   BMET and EKG with office visit.   Follow up with Dr. Acie Fredrickson   Duration of Discharge Encounter   Greater than 30 minutes including physician time.  Signed, Cecilie Kicks, NP 06/12/2019, 10:24 AM

## 2019-06-13 NOTE — Telephone Encounter (Signed)
Patient contacted regarding discharge from [facility] on November 24,2020.  Patient understands to follow up with provider yes--Dr Acie Fredrickson on December 4,2020 at 9:00 at [facility] office Patient understands discharge instructions? yes Patient understands medications and regiment? yes Patient understands to bring all medications to this visit? Yes  Pt reports he sneezed a few times this morning and had bleeding from both nostrils.Lasted a short time and then stopped.  I told him as long as it stopped this was OK.  I explained the importance of continuing all medications as ordered.  I told him he should let us know if he had bleeding that did not stop.

## 2019-06-22 ENCOUNTER — Encounter: Payer: Self-pay | Admitting: Cardiovascular Disease

## 2019-06-22 ENCOUNTER — Ambulatory Visit (INDEPENDENT_AMBULATORY_CARE_PROVIDER_SITE_OTHER): Payer: Medicare Other | Admitting: Cardiovascular Disease

## 2019-06-22 ENCOUNTER — Other Ambulatory Visit: Payer: Self-pay

## 2019-06-22 VITALS — BP 112/68 | HR 71 | Ht 74.0 in | Wt 215.2 lb

## 2019-06-22 DIAGNOSIS — I2510 Atherosclerotic heart disease of native coronary artery without angina pectoris: Secondary | ICD-10-CM

## 2019-06-22 DIAGNOSIS — I10 Essential (primary) hypertension: Secondary | ICD-10-CM

## 2019-06-22 DIAGNOSIS — Z9861 Coronary angioplasty status: Secondary | ICD-10-CM

## 2019-06-22 DIAGNOSIS — I200 Unstable angina: Secondary | ICD-10-CM

## 2019-06-22 DIAGNOSIS — I480 Paroxysmal atrial fibrillation: Secondary | ICD-10-CM

## 2019-06-22 NOTE — Patient Instructions (Signed)
Medication Instructions:  Your physician recommends that you continue on your current medications as directed. Please refer to the Current Medication list given to you today.  *If you need a refill on your cardiac medications before your next appointment, please call your pharmacy*  Lab Work: None Ordered   Testing/Procedures: None Ordered   Follow-Up: At [facility], you and your health needs are our priority.  As part of our continuing mission to provide you with exceptional heart care, we have created designated Provider Care Teams.  These Care Teams include your primary Cardiologist (physician) and Advanced Practice Providers (APPs -  Physician Assistants and Nurse Practitioners) who all work together to provide you with the care you need, when you need it.  Your next appointment:   2 month(s) on February 22 at 9:00 am  The format for your next appointment:   In Person  Provider:   Mertie Moores, MD

## 2019-06-22 NOTE — Progress Notes (Signed)
Cardiology Office Note:    Date:  06/22/2019   ID:  Joseph [name], DOB 08-Feb-1941, MRN [id number]  PCP:  Dema SeverinYork, Regina F, NP  Cardiologist:  Kristeen MissPhilip Nahser, MD  Electrophysiologist:  None   Referring MD: Dema SeverinYork, Regina F, NP   Chief Complaint  Patient presents with  . Coronary Artery Disease     Feb. 17, 2020    Joseph [name] is a 78 y.o. male with a hx of coronary artery disease.  He is status post anterior wall myocardial infarction in 2004 treated with a stent to the LAD with a staged PCI to the right coronary artery.  Also had an non-ST segment elevation myocardial infarction in January 2016 treated treated with a DES to the mid LAD and a DES to the mid and distal circumflex artery. He also has a history of paroxysmal atrial fibrillation, hypertension, hyperlipidemia.  He is intolerant to statins.  He has a history of diabetes mellitus and a previous stroke.  He is a previous patient of Dr. Okey DupreEnd .  He was last seen in our office in July, 2019 by Tereso NewcomerScott Weaver, PA.  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  Denies any chest pain. Rides a stationary bike - does not have any issues with that  Follows a low chol diet .   Has had a rash across his body for the past 2 years .     Might be related to the onset of the Eliquis .  He thinks the rash is worse since he started Coreg.   Oct. 5, 2020 Doing well.    No CP,  Works around the yard,  Hip pain limits his exercise.  Just got through building a work shop .   He brought brought blood work from his primary medical doctor.  Vitamin D level is 40.  Sodium is 140.  Potassium is 4.8.  BUN is 34.  Creatinine is 1.24.  Liver enzymes are normal.  Thyroid profiles are normal.  Total cholesterol is 173.  LDL is 106.  Triglycerides are 160.  HDL is 33.  Hemoglobin A1c is 7.6.  He has hyperlipidemia.  He is tried numerous statins and also has tried the PCSK9 inhibitors.  He did not tolerate any of these.  He also has tried Zetia but  did not tolerate it.  We will continue with diet and exercise.  Dec. 4, 2020 :  Joseph [name] is seen today in follow up for a recent hospitalization for unstable angina  Heart catheterization performed on June 11, 2019 reveals diffuse three-vessel coronary artery disease.  He was started on Ranexa 500 mg twice a day.  He was started on Plavix. No bleeding issues.  Developed some neck pain last night  Has occasional random sharp and dull pains in his chest      Past Medical History:  Diagnosis Date  . Abdominal discomfort    Abdominal discomfort: EGD 2011 showed no significant abnormalities.  Possibly due to metformin.  . Allergic rhinitis   . Anticoagulated 05/30/2016  . CAD (coronary artery disease)    a. s/p MI in 2004:  stents to the LAD and staged stent RCA, EF 55%;  b.  NSTEMI (1/16):  LHC - mid LAD stent with diff dist restenosis, prox to mid Dx mod dsz jailed by stent, small OM1 99, mCFX 75, dCFX 80, mRCA stent ok, mPDA mild to mod dsz, EF 55% >> PCI:  Synergy DES to LAD; Synergy DES (x2)  mid and dist CFX  . CAD in native artery 06/12/2019  . 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  s/p back surgery  . DM (diabetes mellitus) (HCC)   . History of CVA (cerebrovascular accident)    Small CVA seen by head CT (incidental) in 1/13.  Carotid dopplers in 1/13 showed minimal disease  . History of Doppler ultrasound    Arterial dopplers (3/11): no evidence for significant PAD. ABIs 10/12: Normal.    . HLD (hyperlipidemia)   . HTN (hypertension)    had side effects with chlorthalidone  . Hx of cardiovascular stress test    Myoview (1/13): Small fixed apical septal defect with no ischemia, EF 58%.  [name] Hx of echocardiogram    Echo (3/11): EF 60%, normal wall motion, mild MR, mild left atrial enlargement, mildly dilated ascending aorta.  [name] PAF (paroxysmal atrial fibrillation) (HCC) 05/29/2016  . Spondylolisthesis    L4-S1     Past Surgical History:  Procedure Laterality Date  . COLONOSCOPY N/A 12/26/2017   Procedure: COLONOSCOPY;  Surgeon: Benancio Deeds, MD;  Location: [facility] ENDOSCOPY;  Service: Gastroenterology;  Laterality: N/A;  . COLONOSCOPY W/ POLYPECTOMY  12/2017  . CORONARY ANGIOGRAM  11/13/2014   Procedure: CORONARY ANGIOGRAM;  Surgeon: Laurey Morale, MD;  Location: [facility] CATH LAB;  Service: Cardiovascular;;  . CORONARY STENT INTERVENTION N/A 05/18/2019   Procedure: CORONARY STENT INTERVENTION;  Surgeon: Marykay Lex, MD;  Location: [facility] INVASIVE CV LAB;  Service: Cardiovascular;  Laterality: N/A;  . CORONARY STENT PLACEMENT  2004   x2 LAD and RCA   . LEFT HEART CATH AND CORONARY ANGIOGRAPHY N/A 05/18/2019   Procedure: LEFT HEART CATH AND CORONARY ANGIOGRAPHY;  Surgeon: Marykay Lex, MD;  Location: [facility] INVASIVE CV LAB;  Service: Cardiovascular;  Laterality: N/A;  . LEFT HEART CATH AND CORONARY ANGIOGRAPHY N/A 06/11/2019   Procedure: LEFT HEART CATH AND CORONARY ANGIOGRAPHY;  Surgeon: Yvonne Kendall, MD;  Location: [facility] INVASIVE CV LAB;  Service: Cardiovascular;  Laterality: N/A;  . LEFT HEART CATHETERIZATION WITH CORONARY ANGIOGRAM N/A 08/02/2014   Procedure: LEFT HEART CATHETERIZATION WITH CORONARY ANGIOGRAM;  Surgeon: Corky Crafts, MD;  Location: [facility] CATH LAB;  Service: Cardiovascular;  Laterality: N/A;  . PROSTATE SURGERY    . SPINAL FUSION     L4-S1    Current Medications: Current Meds  Medication Sig  . acetaminophen (TYLENOL) 650 MG CR tablet Take 1,300 mg by mouth every 8 (eight) hours as needed for pain.  [name] amLODipine (NORVASC) 10 MG tablet Take 10 mg by mouth daily.  [name] apixaban (ELIQUIS) 5 MG TABS tablet Take 1 tablet (5 mg total) by mouth 2 (two) times daily. DO NOT TAKE FOR 3 MORE DAYS. MAY RESTART TAKING IT ON 01/01/2018.  [name] aspirin EC 81 MG EC tablet Take 1 tablet (81 mg total) by mouth daily.  . carvedilol (COREG) 3.125 MG tablet Take 3.125 mg by mouth 2 (two) times daily.    . Cholecalciferol (VITAMIN D3) 25 MCG (1000 UT) CAPS Take 1 capsule by mouth daily.  . clopidogrel (PLAVIX) 75 MG tablet Take 1 tablet (75 mg total) by mouth daily.  . Coenzyme Q10 100 MG capsule Take 1 capsule by mouth daily.  [name] diltiazem (CARDIZEM) 30 MG tablet Take 1 tablet (30 mg total) by mouth as needed (Rapid Palpitations).  . fluticasone (FLONASE) 50 MCG/ACT nasal spray Place 2 sprays into both nostrils daily as needed for  allergies or rhinitis.   [name] glipiZIDE (GLUCOTROL) 10 MG tablet Take 10 mg by mouth 2 (two) times daily before a meal.    . hydroxypropyl methylcellulose / hypromellose (ISOPTO TEARS / GONIOVISC) 2.5 % ophthalmic solution Place 1 drop into both eyes 3 (three) times daily as needed for dry eyes.  . Insulin Degludec 100 UNIT/ML SOLN Inject 22 Units into the skin every morning.  . isosorbide mononitrate (IMDUR) 60 MG 24 hr tablet Take 60 mg by mouth 2 (two) times daily.  [name] lisinopril (PRINIVIL,ZESTRIL) 20 MG tablet Take 1 tablet (20 mg total) by mouth daily.  . nitroGLYCERIN (NITROSTAT) 0.4 MG SL tablet Place 1 tablet (0.4 mg total) under the tongue every 5 (five) minutes as needed for chest pain (x 3 doses).  . Omega-3 Fatty Acids (FISH OIL) 1000 MG CAPS Take 4 capsules (4,000 mg total) by mouth daily.  . OMEGA-3 FATTY ACIDS PO Take 2,000 mg by mouth daily.  . ranolazine (RANEXA) 500 MG 12 hr tablet Take 1 tablet (500 mg total) by mouth 2 (two) times daily.  [name] triamcinolone cream (KENALOG) 0.5 % Apply 1 application topically as needed for irritation.     Allergies:   Bempedoic acid, [name] [alirocumab], Rosuvastatin, and Zetia [ezetimibe]   Social History   Socioeconomic History  . Marital status: Divorced    Spouse name: Not on file  . Number of children: 2  . Years of education: Not on file  . Highest education level: Not on file  Occupational History  . Occupation: retired  Scientific laboratory technician  . Financial resource strain: Not on file  . Food insecurity    Worry:  Not on file    Inability: Not on file  . Transportation needs    Medical: Not on file    Non-medical: Not on file  Tobacco Use  . Smoking status: Never Smoker  . Smokeless tobacco: Never Used  Substance and Sexual Activity  . Alcohol use: No  . Drug use: No  . Sexual activity: Not on file  Lifestyle  . Physical activity    Days per week: Not on file    Minutes per session: Not on file  . Stress: Not on file  Relationships  . Social Herbalist on phone: Not on file    Gets together: Not on file    Attends religious service: Not on file    Active member of club or organization: Not on file    Attends meetings of clubs or organizations: Not on file    Relationship status: Not on file  Other Topics Concern  . Not on file  Social History Narrative  . Not on file     Family History: The patient's family history includes Heart attack in his father and mother. There is no history of Stroke.  ROS:   Please see the history of present illness.     All other systems reviewed and are negative.  EKGs/Labs/Other Studies Reviewed:    The following studies were reviewed today:   . Recent Labs: 07/21/2018: ALT 17 05/17/2019: Magnesium 1.9; TSH 2.172 06/12/2019: BUN 30; Creatinine, Ser 1.30; Hemoglobin 12.1; Platelets 225; Potassium 4.3; Sodium 138  Recent Lipid Panel    Component Value Date/Time   CHOL 174 05/18/2019 0210   CHOL 114 07/21/2018 0747   TRIG 136 05/18/2019 0210   HDL 32 (L) 05/18/2019 0210   HDL 38 (L) 07/21/2018 0747   CHOLHDL 5.4 05/18/2019 0210   VLDL 27  05/18/2019 0210   LDLCALC 115 (H) 05/18/2019 0210   LDLCALC 50 07/21/2018 0747   LDLDIRECT 83.0 10/28/2014 1622   Physical Exam: Blood pressure 112/68, pulse 71, height  (1.88 m), weight 215 lb 3.2 oz (97.6 kg), SpO2 98 %.  GEN:  Well nourished, well developed in no acute distress HEENT: Normal NECK: No JVD; No carotid bruits LYMPHATICS: No lymphadenopathy CARDIAC: RRR , no murmurs,  rubs, gallops RESPIRATORY:  Clear to auscultation without rales, wheezing or rhonchi  ABDOMEN: Soft, non-tender, non-distended MUSCULOSKELETAL:  No edema; No deformity  SKIN: Warm and dry NEUROLOGIC:  Alert and oriented x 3    ECG:   June 22, 2019: Normal sinus rhythm at 60 beats a minute.  First-degree AV block.  Previous septal wall myocardial infarction.  ASSESSMENT:    1. CAD S/P percutaneous coronary angioplasty   2. Essential hypertension   3. Coronary artery disease involving native coronary artery of native heart without angina pectoris   4. CAD in native artery   5. Paroxysmal atrial fibrillation (HCC)    PLAN:    In order of problems listed above:  1. 1.  Coronary artery disease:  Cath from Nov. 23, 2020 shows moderate  diffuse 3 V .  He has been started on Ranexa.  2.  Paroxysmal atrial fibrillation: on Eliquis .   HR is well controlled.  Is currently in normal sinus rhythm. He is on eliquis , plavix and ASA until Dec. 23,  He will stop ASA at that time and cont plavix and eliquis   3.  Hyperlipidemia: This is being managed by his primary medical doctor.  He is intolerant to statins, Zetia, PCSK9 inhibitors.  Continue with diet.     will see him in 1 year   Medication Adjustments/Labs and Tests Ordered: Current medicines are reviewed at length with the patient today.  Concerns regarding medicines are outlined above.  Orders Placed This Encounter  Procedures  . EKG 12-Lead   No orders of the defined types were placed in this encounter.   Patient Instructions  Medication Instructions:  Your physician recommends that you continue on your current medications as directed. Please refer to the Current Medication list given to you today.  *If you need a refill on your cardiac medications before your next appointment, please call your pharmacy*  Lab Work: None Ordered   Testing/Procedures: None Ordered   Follow-Up: At [facility], you and your health  needs are our priority.  As part of our continuing mission to provide you with exceptional heart care, we have created designated Provider Care Teams.  These Care Teams include your primary Cardiologist (physician) and Advanced Practice Providers (APPs -  Physician Assistants and Nurse Practitioners) who all work together to provide you with the care you need, when you need it.  Your next appointment:   2 month(s) on February 22 at 9:00 am  The format for your next appointment:   In Person  Provider:   Kristeen Miss, MD       Signed, Kristeen Miss, MD  06/22/2019 10:41 AM    [facility]

## 2019-06-27 DIAGNOSIS — D513 Other dietary vitamin B12 deficiency anemia: Secondary | ICD-10-CM | POA: Insufficient documentation

## 2019-07-04 ENCOUNTER — Ambulatory Visit: Payer: Medicare Other | Admitting: Cardiology

## 2019-07-24 NOTE — Addendum Note (Signed)
Addended by: Maryon Kemnitz E on: 07/24/2019 04:24 PM   Modules accepted: Orders

## 2019-08-24 ENCOUNTER — Telehealth (HOSPITAL_COMMUNITY): Payer: Self-pay | Admitting: Pharmacist

## 2019-08-24 NOTE — Telephone Encounter (Signed)
Patient underwent cardiac catheterization with placement of drug-eluting stent on 05/18/19. He was discharged on triple therapy with aspirin, clopidogrel (Plavix), and apixaban (Eliquis). The original plan was to de-escalate therapy after one month, which was then changed to 12/23. Called the patient to ensure that he stopped taking his aspirin, and he stated that he discontinued the medication about 2 weeks ago. Confirmed that he is still taking his Eliquis and Plavix.

## 2019-08-28 NOTE — Addendum Note (Signed)
Addended byTama Headings on: 08/28/2019 09:17 AM   Modules accepted: Orders

## 2019-09-10 ENCOUNTER — Encounter (INDEPENDENT_AMBULATORY_CARE_PROVIDER_SITE_OTHER): Payer: Self-pay

## 2019-09-10 ENCOUNTER — Ambulatory Visit (INDEPENDENT_AMBULATORY_CARE_PROVIDER_SITE_OTHER): Payer: Medicare Other | Admitting: Cardiovascular Disease

## 2019-09-10 ENCOUNTER — Other Ambulatory Visit: Payer: Self-pay

## 2019-09-10 ENCOUNTER — Encounter: Payer: Self-pay | Admitting: Cardiovascular Disease

## 2019-09-10 VITALS — BP 122/66 | HR 67 | Ht 74.0 in | Wt 218.5 lb

## 2019-09-10 DIAGNOSIS — I200 Unstable angina: Secondary | ICD-10-CM

## 2019-09-10 NOTE — Patient Instructions (Signed)
Medication Instructions:  Your physician has recommended you make the following change in your medication:  **We will be in touch to get your [name] refilled *If you need a refill on your cardiac medications before your next appointment, please call your pharmacy*  Lab Work: None Ordered If you have labs (blood work) drawn today and your tests are completely normal, you will receive your results only by: [name] MyChart Message (if you have MyChart) OR . A paper copy in the mail If you have any lab test that is abnormal or we need to change your treatment, we will call you to review the results.   Testing/Procedures: None Ordered   Follow-Up: At [facility], you and your health needs are our priority.  As part of our continuing mission to provide you with exceptional heart care, we have created designated Provider Care Teams.  These Care Teams include your primary Cardiologist (physician) and Advanced Practice Providers (APPs -  Physician Assistants and Nurse Practitioners) who all work together to provide you with the care you need, when you need it.  Your next appointment:   6 month(s)  The format for your next appointment:   Either In Person or Virtual  Provider:   Tereso Newcomer, PA-C, Chelsea Aus, PA-C or Berton Bon, NP

## 2019-09-10 NOTE — Progress Notes (Signed)
Cardiology Office Note:    Date:  09/10/2019   ID:  Joseph [name], Joseph [name] [birth year], MRN [id number]  PCP:  Joseph Severin, NP  Cardiologist:  Joseph Miss, MD  Electrophysiologist:  None   Referring MD: Joseph Severin, NP   Chief Complaint  Patient presents with  . Coronary Artery Disease     Feb. 17, 2020    Joseph [name] is a 79 y.o. male with a hx of coronary artery disease.  He is status post anterior wall myocardial infarction in 2004 treated with a stent to the LAD with a staged PCI to the right coronary artery.  Also had an non-ST segment elevation myocardial infarction in January 2016 treated treated with a DES to the mid LAD and a DES to the mid and distal circumflex artery. He also has a history of paroxysmal atrial fibrillation, hypertension, hyperlipidemia.  He is intolerant to statins.  He has a history of diabetes mellitus and a previous stroke.  He is a previous patient of Dr. Okey [name] .  He was last seen in our office in July, 2019 by Joseph Newcomer, PA.  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  Denies any chest pain. Rides a stationary bike - does not have any issues with that  Follows a low chol diet .   Has had a rash across his body for the past 2 years .     Might be related to the onset of the Eliquis .  He thinks the rash is worse since he started Coreg.   Oct. 5, 2020 Doing well.    No CP,  Works around the yard,  Hip pain limits his exercise.  Just got through building a work shop .   He brought brought blood work from his primary medical doctor.  Vitamin D level is 40.  Sodium is 140.  Potassium is 4.8.  BUN is 34.  Creatinine is 1.24.  Liver enzymes are normal.  Thyroid profiles are normal.  Total cholesterol is 173.  LDL is 106.  Triglycerides are 160.  HDL is 33.  Hemoglobin A1c is 7.6.  He has hyperlipidemia.  He is tried numerous statins and also has tried the PCSK9 inhibitors.  He did not tolerate any of these.  He also has tried Zetia but  did not tolerate it.  We will continue with diet and exercise.  Dec. 4, 2020 :  Joseph [name] is seen today in follow up for a recent hospitalization for unstable angina  Heart catheterization performed on June 11, 2019 reveals diffuse three-vessel coronary artery disease.  He was started on Ranexa 500 mg twice a day.  He was started on Plavix. No bleeding issues.  Developed some neck pain last night  Has occasional random sharp and dull pains in his chest   September 10, 2019: Joseph [name] is seen today for follow up of his 3 V cad.   Had PCI recently   He stopped taking Ranexa   Is taking imdur No CP .   Is fairly active  .   Walks on occasion   Past Medical History:  Diagnosis Date  . Abdominal discomfort    Abdominal discomfort: EGD 2011 showed no significant abnormalities.  Possibly due to metformin.  . Allergic rhinitis   . Anticoagulated 05/30/2016  . CAD (coronary artery disease)    a. s/p MI in 2004:  stents to the LAD and staged stent RCA, EF 55%;  b.  NSTEMI (1/16):  LHC - mid LAD stent with diff dist restenosis, prox to mid Dx mod dsz jailed by stent, small OM1 99, mCFX 75, dCFX 80, mRCA stent ok, mPDA mild to mod dsz, EF 55% >> PCI:  Synergy DES to LAD; Synergy DES (x2) mid and dist CFX  . CAD in native artery 06/12/2019  . 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  s/p back surgery  . DM (diabetes mellitus) ([place])   . History of CVA (cerebrovascular accident)    Small CVA seen by head CT (incidental) in 1/13.  Carotid dopplers in 1/13 showed minimal disease  . History of Doppler ultrasound    Arterial dopplers (3/11): no evidence for significant PAD. ABIs 10/12: Normal.    . HLD (hyperlipidemia)   . HTN (hypertension)    had side effects with chlorthalidone  . Hx of cardiovascular stress test    Myoview (1/13): Small fixed apical septal defect with no ischemia, EF 58%.  [name] Hx of echocardiogram    Echo (3/11): EF  60%, normal wall motion, mild MR, mild left atrial enlargement, mildly dilated ascending aorta.  [name] PAF (paroxysmal atrial fibrillation) ([place]) 05/29/2016  . Spondylolisthesis    L4-S1    Past Surgical History:  Procedure Laterality Date  . COLONOSCOPY N/A 12/26/2017   Procedure: COLONOSCOPY;  Surgeon: Yetta Flock, MD;  Location: [facility] ENDOSCOPY;  Service: Gastroenterology;  Laterality: N/A;  . COLONOSCOPY W/ POLYPECTOMY  12/2017  . CORONARY ANGIOGRAM  11/13/2014   Procedure: CORONARY ANGIOGRAM;  Surgeon: Larey Dresser, MD;  Location: [facility] CATH LAB;  Service: Cardiovascular;;  . CORONARY STENT INTERVENTION N/A 05/18/2019   Procedure: CORONARY STENT INTERVENTION;  Surgeon: Leonie Man, MD;  Location: [place] CV LAB;  Service: Cardiovascular;  Laterality: N/A;  . CORONARY STENT PLACEMENT  2004   x2 LAD and RCA   . LEFT HEART CATH AND CORONARY ANGIOGRAPHY N/A 05/18/2019   Procedure: LEFT HEART CATH AND CORONARY ANGIOGRAPHY;  Surgeon: Leonie Man, MD;  Location: [place] CV LAB;  Service: Cardiovascular;  Laterality: N/A;  . LEFT HEART CATH AND CORONARY ANGIOGRAPHY N/A 06/11/2019   Procedure: LEFT HEART CATH AND CORONARY ANGIOGRAPHY;  Surgeon: Nelva Bush, MD;  Location: [place] CV LAB;  Service: Cardiovascular;  Laterality: N/A;  . LEFT HEART CATHETERIZATION WITH CORONARY ANGIOGRAM N/A 08/02/2014   Procedure: LEFT HEART CATHETERIZATION WITH CORONARY ANGIOGRAM;  Surgeon: Jettie Booze, MD;  Location: [facility] CATH LAB;  Service: Cardiovascular;  Laterality: N/A;  . PROSTATE SURGERY    . SPINAL FUSION     L4-S1    Current Medications: Current Meds  Medication Sig  . acetaminophen (TYLENOL) 650 MG CR tablet Take 1,300 mg by mouth every 8 (eight) hours as needed for pain.  [name] amLODipine (NORVASC) 10 MG tablet Take 10 mg by mouth daily.  [name] apixaban (ELIQUIS) 5 MG TABS tablet Take 1 tablet (5 mg total) by mouth 2 (two) times daily. DO NOT TAKE FOR 3 MORE DAYS. MAY  RESTART TAKING IT ON 01/01/2018.  . carvedilol (COREG) 3.125 MG tablet Take 3.125 mg by mouth 2 (two) times daily.   . Cholecalciferol (VITAMIN D3) 25 MCG (1000 UT) CAPS Take 1 capsule by mouth daily.  . clopidogrel (PLAVIX) 75 MG tablet Take 1 tablet (75 mg total) by mouth daily.  . Coenzyme Q10 100 MG capsule Take 1 capsule by mouth daily.  [name] diltiazem (CARDIZEM) 30 MG tablet Take 1  tablet (30 mg total) by mouth as needed (Rapid Palpitations).  . fluticasone (FLONASE) 50 MCG/ACT nasal spray Place 2 sprays into both nostrils daily as needed for allergies or rhinitis.   . hydroxypropyl methylcellulose / hypromellose (ISOPTO TEARS / GONIOVISC) 2.5 % ophthalmic solution Place 1 drop into both eyes 3 (three) times daily as needed for dry eyes.  . Insulin Degludec 100 UNIT/ML SOLN Inject 22 Units into the skin every morning.  . isosorbide mononitrate (IMDUR) 60 MG 24 hr tablet Take 60 mg by mouth daily.   [name] lisinopril (PRINIVIL,ZESTRIL) 20 MG tablet Take 1 tablet (20 mg total) by mouth daily.  . nitroGLYCERIN (NITROSTAT) 0.4 MG SL tablet Place 1 tablet (0.4 mg total) under the tongue every 5 (five) minutes as needed for chest pain (x 3 doses).  . Omega-3 Fatty Acids (FISH OIL) 1000 MG CAPS Take 4 capsules (4,000 mg total) by mouth daily.  [name] triamcinolone cream (KENALOG) 0.5 % Apply 1 application topically as needed for irritation.  . [DISCONTINUED] dapagliflozin propanediol (FARXIGA) 5 MG TABS tablet Take 5 mg by mouth daily.  . [DISCONTINUED] ranolazine (RANEXA) 500 MG 12 hr tablet Take 1 tablet (500 mg total) by mouth 2 (two) times daily.     Allergies:   Bempedoic acid, [name] [alirocumab], Rosuvastatin, and Zetia [ezetimibe]   Social History   Socioeconomic History  . Marital status: Divorced    Spouse name: Not on file  . Number of children: 2  . Years of education: Not on file  . Highest education level: Not on file  Occupational History  . Occupation: retired  Tobacco Use  .  Smoking status: Never Smoker  . Smokeless tobacco: Never Used  Substance and Sexual Activity  . Alcohol use: No  . Drug use: No  . Sexual activity: Not on file  Other Topics Concern  . Not on file  Social History Narrative  . Not on file   Social Determinants of Health   Financial Resource Strain:   . Difficulty of Paying Living Expenses: Not on file  Food Insecurity:   . Worried About Programme researcher, broadcasting/film/video in the Last Year: Not on file  . Ran Out of Food in the Last Year: Not on file  Transportation Needs:   . Lack of Transportation (Medical): Not on file  . Lack of Transportation (Non-Medical): Not on file  Physical Activity:   . Days of Exercise per Week: Not on file  . Minutes of Exercise per Session: Not on file  Stress:   . Feeling of Stress : Not on file  Social Connections:   . Frequency of Communication with Friends and Family: Not on file  . Frequency of Social Gatherings with Friends and Family: Not on file  . Attends Religious Services: Not on file  . Active Member of Clubs or Organizations: Not on file  . Attends Banker Meetings: Not on file  . Marital Status: Not on file     Family History: The patient's family history includes Heart attack in his father and mother. There is no history of Stroke.  ROS:   Please see the history of present illness.     All other systems reviewed and are negative.  EKGs/Labs/Other Studies Reviewed:    The following studies were reviewed today:   . Recent Labs: 05/17/2019: Magnesium 1.9; TSH 2.172 06/12/2019: BUN 30; Creatinine, Ser 1.30; Hemoglobin 12.1; Platelets 225; Potassium 4.3; Sodium 138  Recent Lipid Panel    Component Value  Date/Time   CHOL 174 05/18/2019 0210   CHOL 114 07/21/2018 0747   TRIG 136 05/18/2019 0210   HDL 32 (L) 05/18/2019 0210   HDL 38 (L) 07/21/2018 0747   CHOLHDL 5.4 05/18/2019 0210   VLDL 27 05/18/2019 0210   LDLCALC 115 (H) 05/18/2019 0210   LDLCALC 50 07/21/2018 0747    LDLDIRECT 83.0 10/28/2014 1622    Physical Exam: Blood pressure 122/66, pulse 67, height 6\' 2"  (1.88 m), weight 218 lb 8 oz (99.1 kg), SpO2 98 %.  GEN:  Well nourished, well developed in no acute distress HEENT: Normal NECK: No JVD; No carotid bruits LYMPHATICS: No lymphadenopathy CARDIAC: RRR  RESPIRATORY:  Clear to auscultation without rales, wheezing or rhonchi  ABDOMEN: Soft, non-tender, non-distended MUSCULOSKELETAL:  No edema; No deformity  SKIN: Warm and dry NEUROLOGIC:  Alert and oriented x 3    ECG:     ASSESSMENT:    1. Unstable angina (HCC)    PLAN:    In order of problems listed above:  1. 1.  Coronary artery disease:  Cath from Nov. 23, 2020 shows moderate  diffuse 3 V .    He is not having any pain.  He stopped the Ranexa because of some side effects. He is on isosorbide 60 mg a day and seems to be doing well.  2.  Paroxysmal atrial fibrillation: Continue Eliquis.  He sounds like he is in normal sinus rhythm.    3.  Hyperlipidemia: He was on an injectable med ( ? [place] Study )  Needs to be restarted on it   I will have him see an APP in 6 months.      Medication Adjustments/Labs and Tests Ordered: Current medicines are reviewed at length with the patient today.  Concerns regarding medicines are outlined above.  No orders of the defined types were placed in this encounter.  No orders of the defined types were placed in this encounter.   Patient Instructions  Medication Instructions:  Your physician has recommended you make the following change in your medication:  **We will be in touch to get your [name] refilled *If you need a refill on your cardiac medications before your next appointment, please call your pharmacy*  Lab Work: None Ordered If you have labs (blood work) drawn today and your tests are completely normal, you will receive your results only by: 03-13-1978 MyChart Message (if you have MyChart) OR . A paper copy in the mail If you have  any lab test that is abnormal or we need to change your treatment, we will call you to review the results.   Testing/Procedures: None Ordered   Follow-Up: At [facility], you and your health needs are our priority.  As part of our continuing mission to provide you with exceptional heart care, we have created designated Provider Care Teams.  These Care Teams include your primary Cardiologist (physician) and Advanced Practice Providers (APPs -  Physician Assistants and Nurse Practitioners) who all work together to provide you with the care you need, when you need it.  Your next appointment:   6 month(s)  The format for your next appointment:   Either In Person or Virtual  Provider:   [facility], PA-C, Joseph Newcomer, PA-C or Chelsea Aus, NP       Signed, Berton Bon, MD  09/10/2019 5:47 PM    [facility]

## 2019-09-26 ENCOUNTER — Other Ambulatory Visit: Payer: Self-pay

## 2019-09-26 ENCOUNTER — Encounter: Payer: Medicare Other | Admitting: *Deleted

## 2019-09-26 DIAGNOSIS — Z006 Encounter for examination for normal comparison and control in clinical research program: Secondary | ICD-10-CM

## 2019-09-26 NOTE — Research (Addendum)
Subject back to clinic for re-screening in the [facility] 4 research study.  Consent previously signed.  Next appointment for randomization scheduled Nov 21, 2019 @0900 .  Injection given, patient tolerated well.

## 2019-10-04 DIAGNOSIS — R059 Cough, unspecified: Secondary | ICD-10-CM | POA: Insufficient documentation

## 2019-10-04 DIAGNOSIS — J0110 Acute frontal sinusitis, unspecified: Secondary | ICD-10-CM | POA: Insufficient documentation

## 2019-10-22 ENCOUNTER — Telehealth: Payer: Self-pay

## 2019-10-22 NOTE — Telephone Encounter (Signed)
The pts daughter [name] sent the pts completed [employer] pt asst application for Eliquis to the office with a request for [place] to take care of the provider part and to fax all to [employer] Pt Asst program.  I have completed the provider part of the application, scanned and emailed to Dr Harvie Bridge nurse so she can obtain his signature, date it and to fax all to [employer] Pt Asst program at the fax number written on the cover letter included.

## 2019-10-22 NOTE — Telephone Encounter (Signed)
Application has been signed and placed in HIM office to be faxed

## 2019-10-24 NOTE — Telephone Encounter (Signed)
**Note De-Identified Joseph [name] Obfuscation** Letter received from [employer] stating that they have approved the pt for asst with his Eliquis. Approval good until 07/18/2020 Application Case#:[id number]  The letter states that they have notified the pt of this approval as well.

## 2019-11-11 ENCOUNTER — Emergency Department (HOSPITAL_COMMUNITY): Payer: Medicare Other

## 2019-11-11 ENCOUNTER — Other Ambulatory Visit: Payer: Self-pay

## 2019-11-11 ENCOUNTER — Encounter (HOSPITAL_COMMUNITY): Payer: Self-pay | Admitting: Emergency Medicine

## 2019-11-11 ENCOUNTER — Observation Stay (HOSPITAL_COMMUNITY)
Admission: EM | Admit: 2019-11-11 | Discharge: 2019-11-12 | Disposition: A | Discharge status: Home / Self Care | Payer: Medicare Other | Attending: Internal Medicine | Admitting: Internal Medicine

## 2019-11-11 DIAGNOSIS — Z794 Long term (current) use of insulin: Secondary | ICD-10-CM | POA: Diagnosis not present

## 2019-11-11 DIAGNOSIS — Z7901 Long term (current) use of anticoagulants: Secondary | ICD-10-CM | POA: Diagnosis not present

## 2019-11-11 DIAGNOSIS — Z7902 Long term (current) use of antithrombotics/antiplatelets: Secondary | ICD-10-CM | POA: Diagnosis not present

## 2019-11-11 DIAGNOSIS — Z20822 Contact with and (suspected) exposure to covid-19: Secondary | ICD-10-CM | POA: Insufficient documentation

## 2019-11-11 DIAGNOSIS — N289 Disorder of kidney and ureter, unspecified: Secondary | ICD-10-CM | POA: Insufficient documentation

## 2019-11-11 DIAGNOSIS — Z888 Allergy status to other drugs, medicaments and biological substances status: Secondary | ICD-10-CM | POA: Insufficient documentation

## 2019-11-11 DIAGNOSIS — E7800 Pure hypercholesterolemia, unspecified: Secondary | ICD-10-CM | POA: Insufficient documentation

## 2019-11-11 DIAGNOSIS — I480 Paroxysmal atrial fibrillation: Secondary | ICD-10-CM | POA: Insufficient documentation

## 2019-11-11 DIAGNOSIS — E785 Hyperlipidemia, unspecified: Secondary | ICD-10-CM | POA: Diagnosis not present

## 2019-11-11 DIAGNOSIS — Z981 Arthrodesis status: Secondary | ICD-10-CM | POA: Insufficient documentation

## 2019-11-11 DIAGNOSIS — Z8673 Personal history of transient ischemic attack (TIA), and cerebral infarction without residual deficits: Secondary | ICD-10-CM | POA: Diagnosis not present

## 2019-11-11 DIAGNOSIS — Z8249 Family history of ischemic heart disease and other diseases of the circulatory system: Secondary | ICD-10-CM | POA: Diagnosis not present

## 2019-11-11 DIAGNOSIS — I119 Hypertensive heart disease without heart failure: Secondary | ICD-10-CM | POA: Diagnosis not present

## 2019-11-11 DIAGNOSIS — E1151 Type 2 diabetes mellitus with diabetic peripheral angiopathy without gangrene: Secondary | ICD-10-CM | POA: Insufficient documentation

## 2019-11-11 DIAGNOSIS — I25110 Atherosclerotic heart disease of native coronary artery with unstable angina pectoris: Principal | ICD-10-CM | POA: Insufficient documentation

## 2019-11-11 DIAGNOSIS — R079 Chest pain, unspecified: Secondary | ICD-10-CM | POA: Diagnosis present

## 2019-11-11 DIAGNOSIS — M4310 Spondylolisthesis, site unspecified: Secondary | ICD-10-CM | POA: Diagnosis not present

## 2019-11-11 DIAGNOSIS — Z955 Presence of coronary angioplasty implant and graft: Secondary | ICD-10-CM | POA: Diagnosis not present

## 2019-11-11 DIAGNOSIS — Z79899 Other long term (current) drug therapy: Secondary | ICD-10-CM | POA: Insufficient documentation

## 2019-11-11 DIAGNOSIS — I252 Old myocardial infarction: Secondary | ICD-10-CM | POA: Insufficient documentation

## 2019-11-11 DIAGNOSIS — I200 Unstable angina: Secondary | ICD-10-CM

## 2019-11-11 DIAGNOSIS — E1159 Type 2 diabetes mellitus with other circulatory complications: Secondary | ICD-10-CM

## 2019-11-11 LAB — CBC
HCT: 42.9 % (ref 39.0–52.0)
Hemoglobin: 14.8 g/dL (ref 13.0–17.0)
MCH: 31.8 pg (ref 26.0–34.0)
MCHC: 34.5 g/dL (ref 30.0–36.0)
MCV: 92.3 fL (ref 80.0–100.0)
Platelets: 273 10*3/uL (ref 150–400)
RBC: 4.65 MIL/uL (ref 4.22–5.81)
RDW: 12.1 % (ref 11.5–15.5)
WBC: 10.2 10*3/uL (ref 4.0–10.5)
nRBC: 0 % (ref 0.0–0.2)

## 2019-11-11 LAB — BASIC METABOLIC PANEL
Anion gap: 10 (ref 5–15)
BUN: 19 mg/dL (ref 8–23)
CO2: 24 mmol/L (ref 22–32)
Calcium: 9.9 mg/dL (ref 8.9–10.3)
Chloride: 102 mmol/L (ref 98–111)
Creatinine, Ser: 1.28 mg/dL — ABNORMAL HIGH (ref 0.61–1.24)
GFR calc Af Amer: >60 mL/min (ref >60)
GFR calc non Af Amer: 52 mL/min — ABNORMAL LOW (ref >60)
Glucose, Bld: 302 mg/dL — ABNORMAL HIGH (ref 70–99)
Potassium: 4.8 mmol/L (ref 3.5–5.1)
Sodium: 136 mmol/L (ref 135–145)

## 2019-11-11 LAB — CBG MONITORING, ED: Glucose-Capillary: 241 mg/dL — ABNORMAL HIGH (ref 70–99)

## 2019-11-11 LAB — RESPIRATORY PANEL BY RT PCR (FLU A&B, COVID)
Influenza A by PCR: NEGATIVE
Influenza B by PCR: NEGATIVE
SARS Coronavirus 2 by RT PCR: NEGATIVE

## 2019-11-11 LAB — HEMOGLOBIN A1C
Hgb A1c MFr Bld: 8.8 % — ABNORMAL HIGH (ref 4.8–5.6)
Mean Plasma Glucose: 205.86 mg/dL

## 2019-11-11 LAB — TROPONIN I (HIGH SENSITIVITY)
Troponin I (High Sensitivity): 33 ng/L — ABNORMAL HIGH (ref <18)
Troponin I (High Sensitivity): 29 ng/L — ABNORMAL HIGH (ref <18)

## 2019-11-11 MED ORDER — AMLODIPINE BESYLATE 5 MG PO TABS
10.0000 mg | ORAL_TABLET | Freq: Every day | ORAL | Status: DC
  Administered 2019-11-11 – 2019-11-12 (×2): 10 mg via ORAL
  Filled 2019-11-11 (×2): qty 2

## 2019-11-11 MED ORDER — SODIUM CHLORIDE 0.9% FLUSH: 3.0000 mL | Freq: Once | INTRAVENOUS | Status: DC

## 2019-11-11 MED ORDER — ISOSORBIDE MONONITRATE ER 30 MG PO TB24
90.0000 mg | ORAL_TABLET | Freq: Every day | ORAL | Status: DC
  Administered 2019-11-12: 10:00:00 90 mg via ORAL
  Filled 2019-11-11: qty 3

## 2019-11-11 MED ORDER — LISINOPRIL 20 MG PO TABS
20.0000 mg | ORAL_TABLET | Freq: Every day | ORAL | Status: DC
  Administered 2019-11-11 – 2019-11-12 (×2): 20 mg via ORAL
  Filled 2019-11-11 (×2): qty 1

## 2019-11-11 MED ORDER — RANOLAZINE ER 500 MG PO TB12
500.0000 mg | ORAL_TABLET | Freq: Two times a day (BID) | ORAL | Status: DC
  Administered 2019-11-11 – 2019-11-12 (×3): 500 mg via ORAL
  Filled 2019-11-11 (×3): qty 1

## 2019-11-11 MED ORDER — INSULIN ASPART 100 UNIT/ML ~~LOC~~ SOLN
0.0000 [IU] | Freq: Three times a day (TID) | SUBCUTANEOUS | Status: DC
  Administered 2019-11-12: 2 [IU] via SUBCUTANEOUS

## 2019-11-11 MED ORDER — DILTIAZEM HCL 30 MG PO TABS
30.0000 mg | ORAL_TABLET | ORAL | Status: DC | PRN
  Administered 2019-11-12: 30 mg via ORAL
  Filled 2019-11-11: qty 1

## 2019-11-11 MED ORDER — HYPROMELLOSE (GONIOSCOPIC) 2.5 % OP SOLN: 1.0000 [drp] | Freq: Three times a day (TID) | OPHTHALMIC | Status: DC | PRN

## 2019-11-11 MED ORDER — CLOPIDOGREL BISULFATE 75 MG PO TABS
75.0000 mg | ORAL_TABLET | Freq: Every day | ORAL | Status: DC
  Administered 2019-11-11 – 2019-11-12 (×2): 75 mg via ORAL
  Filled 2019-11-11 (×2): qty 1

## 2019-11-11 MED ORDER — APIXABAN 5 MG PO TABS
5.0000 mg | ORAL_TABLET | Freq: Two times a day (BID) | ORAL | Status: DC
  Administered 2019-11-11 – 2019-11-12 (×2): 5 mg via ORAL
  Filled 2019-11-11 (×2): qty 1

## 2019-11-11 MED ORDER — CARVEDILOL 3.125 MG PO TABS
3.1250 mg | ORAL_TABLET | Freq: Two times a day (BID) | ORAL | Status: DC
  Administered 2019-11-11 – 2019-11-12 (×2): 3.125 mg via ORAL
  Filled 2019-11-11 (×2): qty 1

## 2019-11-11 MED ORDER — ONDANSETRON HCL 4 MG/2ML IJ SOLN: 4.0000 mg | Freq: Four times a day (QID) | INTRAMUSCULAR | Status: DC | PRN

## 2019-11-11 MED ORDER — KETOROLAC TROMETHAMINE 30 MG/ML IJ SOLN
30.0000 mg | Freq: Once | INTRAMUSCULAR | Status: AC
  Administered 2019-11-11: 19:00:00 30 mg via INTRAVENOUS
  Filled 2019-11-11: qty 1

## 2019-11-11 MED ORDER — ACETAMINOPHEN 325 MG PO TABS: 650.0000 mg | ORAL_TABLET | ORAL | Status: DC | PRN

## 2019-11-11 MED ORDER — FLUTICASONE PROPIONATE 50 MCG/ACT NA SUSP: 2.0000 | Freq: Every day | NASAL | Status: DC | PRN

## 2019-11-11 MED ORDER — NITROGLYCERIN 0.4 MG SL SUBL: 0.4000 mg | SUBLINGUAL_TABLET | SUBLINGUAL | Status: DC | PRN

## 2019-11-11 NOTE — H&P (Addendum)
Cardiology Admission History and Physical:   Patient ID: Joseph [name] MRN: [id number]; DOB: 09-02-1940   Admission date: 11/11/2019  Primary Care Provider: Dema SeverinYork, Regina F, NP Primary Cardiologist: Kristeen MissPhilip Nahser, MD  Primary Electrophysiologist:  None   Chief Complaint: Chest Pain  Patient Profile:   Joseph [name] is a 79 y.o. male with a history of CAD with prior stenting to LAD, LCX, and RCA; paroxysmal atrial fibrillation on Eliquis; small CVA incidentally found on head CT in 07/2011; hypertension; hyperlipidemia intolerant to statin and PCSK9 inhibitors; diabetes mellitus; and degenerative disk disease/spondylolisthesis who presents with chest pain.  History of Present Illness:   Joseph [name] is a [age] year old male with the above history who is followed by Dr. Elease HashimotoNahser. He has a history of anterior MI in 2004 treated with stent to LAD and staged PCI to RCA. Also had a NSTEMI in 07/2014 treated with DES to mid LAD and DES to mid and distal CX. Most recently admitted in 05/2019 with unstable angina. High-sensitivity troponin minimally elevated and flat at that time. Left heart catheterization showed stable CAD compared to prior cath in 04/2019. Patent LAD, LCX, and RCA. stents with moderate in-stent restenosis of LAD stent. Films were reviewed with intervention team and decision was made to continue escalation of medical therapy as targets are suboptimal for PCI or CABG. Patient was started on Ranexa 500mg  twice daily. Patient last seen by Dr. Elease HashimotoNahser in 08/2019 and patient had stopped taking Ranexa at that time but was taking Imdur. He denied any chest pain at that visit.   Patient presents to ED via EMS with left sided chest pain/burning. Patient reports on Tuesday he was washing his car when his heart started to race for about 2 minutes. Heart racing resolved after resting for about 5 minutes. He started cleaning his car again and the same thing happened. Palpitations resolved again after 5 minutes  of rest. He had no chest pain, lightheadedness, or dizziness with this. No syncope. Later that evening he developed about 2 hours of chest pressure that eventually resolved on its own with rest. He did not try Nitro. He had no further symptoms until yesterday afternoon around 4pm when he developed substernal non-radiating chest pressure/burning. No associated shortness of breath, diaphoresis, nausea, or vomiting. Pain persisted throughout the night and was still present this morning when he woke up. Therefore, he decided to come to the ED for further evaluation. He has had some head congestion recently and was treated with an antibiotic. No fever, chills, or cough. No abnormal bleeding or bruising.   In the ED, vitals stable. EKG showed normal sinus rhythm with no acute ST/T changes. Initial high-sensitivity troponin minimally elevated at 33. Chest x-ray showed no acute findings. WBC 10.2, Hgb 14.8, Plts 273. Na 136, K 4.8, Glucose 302, BUN 18, Cr 1.28. He took Aspirin 324mg  prior to EMS arrival.   At the time of this evaluation, patient is chest pain free.   Past Medical History:  Diagnosis Date  . Abdominal discomfort    Abdominal discomfort: EGD 2011 showed no significant abnormalities.  Possibly due to metformin.  . Allergic rhinitis   . Anticoagulated 05/30/2016  . CAD (coronary artery disease)    a. s/p MI in 2004:  stents to the LAD and staged stent RCA, EF 55%;  b.  NSTEMI (1/16):  LHC - mid LAD stent with diff dist restenosis, prox to mid Dx mod dsz jailed by stent, small OM1 99, mCFX 75,  dCFX 80, mRCA stent ok, mPDA mild to mod dsz, EF 55% >> PCI:  Synergy DES to LAD; Synergy DES (x2) mid and dist CFX  . CAD in native artery 06/12/2019  . 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  s/p back surgery  . DM (diabetes mellitus) (HCC)   . History of CVA (cerebrovascular accident)    Small CVA seen by head CT (incidental) in 1/13.   Carotid dopplers in 1/13 showed minimal disease  . History of Doppler ultrasound    Arterial dopplers (3/11): no evidence for significant PAD. ABIs 10/12: Normal.    . HLD (hyperlipidemia)   . HTN (hypertension)    had side effects with chlorthalidone  . Hx of cardiovascular stress test    Myoview (1/13): Small fixed apical septal defect with no ischemia, EF 58%.  [name] Hx of echocardiogram    Echo (3/11): EF 60%, normal wall motion, mild MR, mild left atrial enlargement, mildly dilated ascending aorta.  [name] PAF (paroxysmal atrial fibrillation) (HCC) 05/29/2016  . Spondylolisthesis    L4-S1    Past Surgical History:  Procedure Laterality Date  . COLONOSCOPY N/A 12/26/2017   Procedure: COLONOSCOPY;  Surgeon: Benancio Deeds, MD;  Location: [facility] ENDOSCOPY;  Service: Gastroenterology;  Laterality: N/A;  . COLONOSCOPY W/ POLYPECTOMY  12/2017  . CORONARY ANGIOGRAM  11/13/2014   Procedure: CORONARY ANGIOGRAM;  Surgeon: Laurey Morale, MD;  Location: [facility] CATH LAB;  Service: Cardiovascular;;  . CORONARY STENT INTERVENTION N/A 05/18/2019   Procedure: CORONARY STENT INTERVENTION;  Surgeon: Marykay Lex, MD;  Location: [facility] INVASIVE CV LAB;  Service: Cardiovascular;  Laterality: N/A;  . CORONARY STENT PLACEMENT  2004   x2 LAD and RCA   . LEFT HEART CATH AND CORONARY ANGIOGRAPHY N/A 05/18/2019   Procedure: LEFT HEART CATH AND CORONARY ANGIOGRAPHY;  Surgeon: Marykay Lex, MD;  Location: [facility] INVASIVE CV LAB;  Service: Cardiovascular;  Laterality: N/A;  . LEFT HEART CATH AND CORONARY ANGIOGRAPHY N/A 06/11/2019   Procedure: LEFT HEART CATH AND CORONARY ANGIOGRAPHY;  Surgeon: Yvonne Kendall, MD;  Location: [facility] INVASIVE CV LAB;  Service: Cardiovascular;  Laterality: N/A;  . LEFT HEART CATHETERIZATION WITH CORONARY ANGIOGRAM N/A 08/02/2014   Procedure: LEFT HEART CATHETERIZATION WITH CORONARY ANGIOGRAM;  Surgeon: Corky Crafts, MD;  Location: [facility] CATH LAB;  Service: Cardiovascular;  Laterality: N/A;   . PROSTATE SURGERY    . SPINAL FUSION     L4-S1     Medications Prior to Admission: Prior to Admission medications   Medication Sig Start Date End Date Taking? Authorizing Provider  acetaminophen (TYLENOL) 650 MG CR tablet Take 1,300 mg by mouth every 8 (eight) hours as needed for pain.    [provider]  amLODipine (NORVASC) 10 MG tablet Take 10 mg by mouth daily. 11/28/17   [provider]  apixaban (ELIQUIS) 5 MG TABS tablet Take 1 tablet (5 mg total) by mouth 2 (two) times daily. DO NOT TAKE FOR 3 MORE DAYS. MAY RESTART TAKING IT ON 01/01/2018. 12/28/17   Elease Etienne, MD  carvedilol (COREG) 3.125 MG tablet Take 3.125 mg by mouth 2 (two) times daily.  08/09/18   [provider]  Cholecalciferol (VITAMIN D3) 25 MCG (1000 UT) CAPS Take 1 capsule by mouth daily.    [provider]  clopidogrel (PLAVIX) 75 MG tablet Take 1 tablet (75 mg total) by mouth daily. 05/20/19   Leone Brand, NP  Coenzyme Q10 100 MG capsule Take 1 capsule by mouth daily.    [provider]  diltiazem (CARDIZEM) 30 MG tablet Take 1 tablet (30 mg total) by mouth as needed (Rapid Palpitations). 01/31/18   Tereso Newcomer T, PA-C  fluticasone (FLONASE) 50 MCG/ACT nasal spray Place 2 sprays into both nostrils daily as needed for allergies or rhinitis.  03/31/15   [provider]  hydroxypropyl methylcellulose / hypromellose (ISOPTO TEARS / GONIOVISC) 2.5 % ophthalmic solution Place 1 drop into both eyes 3 (three) times daily as needed for dry eyes.    [provider]  Insulin Degludec 100 UNIT/ML SOLN Inject 22 Units into the skin every morning.    [provider]  isosorbide mononitrate (IMDUR) 60 MG 24 hr tablet Take 60 mg by mouth daily.     [provider]  lisinopril (PRINIVIL,ZESTRIL) 20 MG tablet Take 1 tablet (20 mg total) by mouth daily. 10/31/17   Tereso Newcomer T, PA-C  nitroGLYCERIN (NITROSTAT) 0.4 MG SL tablet Place 1 tablet (0.4 mg  total) under the tongue every 5 (five) minutes as needed for chest pain (x 3 doses). 06/12/19   Nahser, Deloris Ping, MD  Omega-3 Fatty Acids (FISH OIL) 1000 MG CAPS Take 4 capsules (4,000 mg total) by mouth daily. 06/03/16   Bhagat, Sharrell Ku, PA  triamcinolone cream (KENALOG) 0.5 % Apply 1 application topically as needed for irritation. 08/24/17   [provider]     Allergies:    Allergies  Allergen Reactions  . Bempedoic Acid Other (See Comments)    NEXLETOL Myalgias, GI upset  . [name] [Alirocumab] Other (See Comments)    Muscle weakness and rash  . Rosuvastatin Other (See Comments)    Low energy/leg and hip pain  . Zetia [Ezetimibe] Other (See Comments)    myalgias    Social History:   Social History   Socioeconomic History  . Marital status: Divorced    Spouse name: Not on file  . Number of children: 2  . Years of education: Not on file  . Highest education level: Not on file  Occupational History  . Occupation: retired  Tobacco Use  . Smoking status: Never Smoker  . Smokeless tobacco: Never Used  Substance and Sexual Activity  . Alcohol use: No  . Drug use: No  . Sexual activity: Not on file  Other Topics Concern  . Not on file  Social History Narrative  . Not on file   Social Determinants of Health   Financial Resource Strain:   . Difficulty of Paying Living Expenses:   Food Insecurity:   . Worried About Programme researcher, broadcasting/film/video in the Last Year:   . Barista in the Last Year:   Transportation Needs:   . Freight forwarder (Medical):   [name] Lack of Transportation (Non-Medical):   Physical Activity:   . Days of Exercise per Week:   . Minutes of Exercise per Session:   Stress:   . Feeling of Stress :   Social Connections:   . Frequency of Communication with Friends and Family:   . Frequency of Social Gatherings with Friends and Family:   . Attends Religious Services:   . Active Member of Clubs or Organizations:   . Attends Tax inspector Meetings:   [name] Marital Status:   Intimate Partner Violence:   . Fear of Current or Ex-Partner:   . Emotionally Abused:   [name] Physically Abused:   . Sexually Abused:  Family History:   The patient's family history includes Heart attack in his father and mother. There is no history of Stroke.    ROS:  Please see the history of present illness.  All other ROS reviewed and negative.     Physical Exam/Data:   Vitals:   11/11/19 1215 11/11/19 1230 11/11/19 1245 11/11/19 1300  BP: 135/70 (!) 142/77 (!) 147/81 (!) 147/76  Pulse: (!) 52 (!) 52 (!) 55 (!) 53  Resp: 15 16 15 14   Temp:      TempSrc:      SpO2: 95% 96% 96% 96%  Weight:      Height:       No intake or output data in the 24 hours ending 11/11/19 1450 Last 3 Weights 11/11/2019 09/10/2019 06/22/2019  Weight (lbs) 210 lb 218 lb 8 oz 215 lb 3.2 oz  Weight (kg) 95.255 kg 99.111 kg 97.614 kg     Body mass index is 26.96 kg/m.  General: 79 y.o. male resting comfortably in no acute distress. HEENT: Normocephalic and atraumatic. Sclera clear.  Neck: Supple. No carotid bruits. No JVD. Heart: RRR. Distinct S1 and S2. No murmurs, gallops, or rubs. Radial and distal pedal pulses 2+ and equal bilaterally. Lungs: No increased work of breathing. Clear to ausculation bilaterally. No wheezes, rhonchi, or rales.  Abdomen: Soft, non-distended, and non-tender to palpation. Bowel sounds present. Extremities: No lower extremity edema.    Skin: Warm and dry. Neuro: Alert and oriented x3. No focal deficits. Psych: Normal affect. Responds appropriately.  EKG:  The ECG that was done  was personally reviewed and demonstrates normal sinus rhythm, rate 68, with possible Q waves in leads V1-V2 but no acute ST/T changes.  Relevant CV Studies:  Echocardiogram 11/202/2021: Impressions: 1. Left ventricular ejection fraction, by visual estimation, is 55 to  60%. The left ventricle has normal function. There is moderately increased    left ventricular hypertrophy.  2. Left ventricular diastolic parameters are indeterminate.  3. The left ventricle demonstrates regional wall motion abnormalities.  4. Anterior/septal wall motion appears similar to prior, mildy  hypokinetic. Basal/mid inferior wall mildly hypokinetic to me, similar to  prior study (but not previously called).  5. Global right ventricle has normal systolic function.The right  ventricular size is normal. No increase in right ventricular wall  thickness.  6. Left atrial size was normal.  7. Right atrial size was normal.  8. The mitral valve is normal in structure. No evidence of mitral valve  regurgitation. No evidence of mitral stenosis.  9. The tricuspid valve is normal in structure. Tricuspid valve  regurgitation is not demonstrated.  10. The aortic valve is tricuspid. Aortic valve regurgitation is not  visualized. No evidence of aortic valve sclerosis or stenosis.  11. The pulmonic valve was grossly normal. Pulmonic valve regurgitation is  not visualized.  12. Aortic dilatation noted.  13. There is mild dilatation of the ascending aorta measuring 41 mm.  14. Hypokinesis consistent with ischemia in the LAD territory, overall  LVEF is preserved because other walls are hyperdynamic.  Laboratory Data:  High Sensitivity Troponin:   Recent Labs  Lab 11/11/19 0914 11/11/19 1302  TROPONINIHS 33* 29*      Chemistry Recent Labs  Lab 11/11/19 0914  NA 136  K 4.8  CL 102  CO2 24  GLUCOSE 302*  BUN 19  CREATININE 1.28*  CALCIUM 9.9  GFRNONAA 52*  GFRAA >60  ANIONGAP 10    No results for input(s): PROT,  ALBUMIN, AST, ALT, ALKPHOS, BILITOT in the last 168 hours. Hematology Recent Labs  Lab 11/11/19 0914  WBC 10.2  RBC 4.65  HGB 14.8  HCT 42.9  MCV 92.3  MCH 31.8  MCHC 34.5  RDW 12.1  PLT 273   BNPNo results for input(s): BNP, PROBNP in the last 168 hours.  DDimer No results for input(s): DDIMER in the last 168  hours.   Radiology/Studies:  DG Chest 2 View  Result Date: 11/11/2019 CLINICAL DATA:  Chest pain. EXAM: CHEST - 2 VIEW COMPARISON:  June 07, 2019 FINDINGS: The heart size and mediastinal contours are within normal limits. Both lungs are clear. The visualized skeletal structures are unremarkable. IMPRESSION: No active cardiopulmonary disease. Electronically Signed   By: Gerome Sam III M.D   On: 11/11/2019 10:01   TIMI Risk Score for Unstable Angina or Non-ST Elevation MI:   The patient's TIMI risk score is 5, which indicates a 26% risk of all cause mortality, new or recurrent myocardial infarction or need for urgent revascularization in the next 14 days.   Assessment and Plan:   Chest Pain with Known CAD - Patient presented with chest pain similar to prior angina. History of CAD with prior stenting to LAD, LCX, and RCA. Last cath in 05/2019 showed stable CAD compared to prior cath in 04/2019. Patent LAD, LCX, and RCA. stents with moderate in-stent restenosis of LAD stent. Films were reviewed with intervention team and decision was made to continue escalation of medical therapy as targets are suboptimal for PCI or CABG. Ranexa was added. However, patient states he stopped taking this (cannot remember why). - EKG showed no acute ST/T changes.  - High-sensitivity troponin minimally elevated and flat at 33 >> 29. Not consistent with ACS. - Currently chest pain free.  - Given no optimal targets for PCI or CABG on last cath, will continue to treat medically at this time. Will continue home Plavix  daily, Amlodipine  daily, and coreg 3.125mg  twice daily. Will add back Ranexa  twice daily with first dose scheduled for this evening. Will then increase Imdur to  daily starting tomorrow.   Paroxysmal Atrial Fibrillation - Sinus rhythm on presentation.  - Continue Coreg 3.125mg  twice daily.  - Also takes PRN Cardizem  daily for rapid palpitations. - Continue chronic  anticoagulation with Eliquis  twice daily.   Hypertension - BP mildly elevated in the ED.  - Continue Amlodipine  daily, Coreg 3.125mg  twice daily, Lisinopril  daily. - Increase Imdur as above.   Hyperlipidemia - Last lipid panel in 04/2019: Total Cholesterol 174, Triglycerides 136, HDL 32, LDL 115.  - LDL goal <70 given CAD. - Patient has been unable to tolerate multiple statins, Zetia, or PCSK9 inhibitors.   Type 2 Diabetes Mellitus - Will place on sliding scale insulin.   Severity of Illness: The appropriate patient status for this patient is OBSERVATION. Observation status is judged to be reasonable and necessary in order to provide the required intensity of service to ensure the patient's safety. The patient's presenting symptoms, physical exam findings, and initial radiographic and laboratory data in the context of their medical condition is felt to place them at decreased risk for further clinical deterioration. Furthermore, it is anticipated that the patient will be medically stable for discharge from the hospital within 2 midnights of admission. The following factors support the patient status of observation.   " The patient's presenting symptoms include chest pain. " The physical exam findings as above " The  initial radiographic and laboratory data as above.     For questions or updates, please contact [facility] Please consult [email or web address] for contact info under        Signed, Corrin Parker, PA-C  11/11/2019 2:50 PM   Patient seen and examined with Marjie Skiff PA-C.  Agree as above, with the following exceptions and changes as noted below.He is chest pain free, and even thought he might go home. We discussed staying in hospital while reinitiating ranexa and titrating imdur. History of recent prior caths with stable disease.. Gen: NAD, CV: RRR, no murmurs, Lungs: clear, Abd: soft, Extrem: Warm, well perfused, no edema, Neuro/Psych: alert and  oriented x 3, normal mood and affect. All available labs, radiology testing, previous records reviewed. Will increase imdur tomorrow morning and reinitiate ranexa today. Patient thinks he may have stopped it due to side effects that he read about. He is amenable to being observed overnight for chest pain recurrence or rapid heart rates with chest pain.  Parke Poisson, MD 11/11/19 3:29 PM

## 2019-11-11 NOTE — ED Provider Notes (Signed)
[facility] EMERGENCY DEPARTMENT Provider Note   CSN: [id number] Arrival date & time: 11/11/19  4742     History Chief Complaint  Patient presents with  . Chest Pain    Joseph [name] is a 79 y.o. male.  Patient with history of CAD, s/p stenting, most recent PCI 05/2019 with stable multivessel disease, stenting in 04/2019, currently on Imdur (60mg  daily), apixaban --presents the emergency department with complaint of chest pain shortness of breath.  Patient states that 5 days ago he was cleaning his car could only work for 2 or 3 minutes before having to sit down and rest for 5 minutes due to a burning pain in his left chest.  He had some pain later that night which resolved on its own.  Pain did not return until yesterday at around 4 PM.  This started at rest.  It was burning again and did not radiate.  No associated vomiting or diaphoresis.  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  He denies any abdominal pain.  He has some chronic numbness and tingling in his right arm and leg which he has had in the past.  Patient took aspirin prior to arrival.     History from most recent cardiology note (Dr. ):  Joseph [name] is a 79 y.o. male with a hx of coronary artery disease.  He is status post anterior wall myocardial infarction in 2004 treated with a stent to the LAD with a staged PCI to the right coronary artery.  Also had an non-ST segment elevation myocardial infarction in January 2016 treated treated with a DES to the mid LAD and a DES to the mid and distal circumflex artery. He also has a history of paroxysmal atrial fibrillation, hypertension, hyperlipidemia.  He is intolerant to statins.  He has a history of diabetes mellitus and a previous stroke.        Past Medical History:  Diagnosis  Date  . Abdominal discomfort    Abdominal discomfort: EGD 2011 showed no significant abnormalities.  Possibly due to metformin.  . Allergic rhinitis   . Anticoagulated 05/30/2016  . CAD (coronary artery disease)    a. s/p MI in 2004:  stents to the LAD and staged stent RCA, EF 55%;  b.  NSTEMI (1/16):  LHC - mid LAD stent with diff dist restenosis, prox to mid Dx mod dsz jailed by stent, small OM1 99, mCFX 75, dCFX 80, mRCA stent ok, mPDA mild to mod dsz, EF 55% >> PCI:  Synergy DES to LAD; Synergy DES (x2) mid and dist CFX  . CAD in native artery 06/12/2019  . 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  s/p back surgery  . DM (diabetes mellitus) (HCC)   . History of CVA (cerebrovascular accident)    Small CVA seen by head CT (incidental) in 1/13.  Carotid dopplers in 1/13 showed minimal disease  . History of Doppler ultrasound    Arterial dopplers (3/11): no evidence for significant PAD. ABIs 10/12: Normal.    . HLD (hyperlipidemia)   . HTN (hypertension)    had side effects with chlorthalidone  . Hx of cardiovascular stress test    Myoview (1/13): Small fixed apical septal defect with  no ischemia, EF 58%.  [name] Hx of echocardiogram    Echo (3/11): EF 60%, normal wall motion, mild MR, mild left atrial enlargement, mildly dilated ascending aorta.  [name] PAF (paroxysmal atrial fibrillation) ([place]) 05/29/2016  . Spondylolisthesis    L4-S1    Patient Active Problem List   Diagnosis Date Noted  . CAD in native artery 06/12/2019  . Chest pain 06/07/2019  . Unstable angina ([place]) 05/17/2019  . Lower GI bleed 12/26/2017  . Hypotension 12/26/2017  . Postural dizziness with presyncope 12/26/2017  . Fatigue 09/22/2017  . Coronary artery disease of native artery of native heart with stable angina pectoris ([place]) 04/11/2017  . Hyperlipidemia 04/11/2017  . Anticoagulated 05/30/2016  . Paroxysmal atrial fibrillation ([place]) 05/29/2016  .  Exertional angina (HCC) 08/02/2014  . Bradycardia 02/02/2012  . CVA (cerebral infarction) 08/01/2011  . Leg pain 03/31/2011  . Abdominal pain 10/13/2010  . Hip pain 10/13/2010  . Pure hypercholesterolemia 09/09/2009  . Essential hypertension 09/09/2009  . Peripheral vascular disease ([place]) 09/09/2009  . Type 2 diabetes mellitus with vascular disease ([place]) 09/08/2009  . CAD S/P percutaneous coronary angioplasty 09/08/2009    Past Surgical History:  Procedure Laterality Date  . COLONOSCOPY N/A 12/26/2017   Procedure: COLONOSCOPY;  Surgeon: Yetta Flock, MD;  Location: [facility] ENDOSCOPY;  Service: Gastroenterology;  Laterality: N/A;  . COLONOSCOPY W/ POLYPECTOMY  12/2017  . CORONARY ANGIOGRAM  11/13/2014   Procedure: CORONARY ANGIOGRAM;  Surgeon: Larey Dresser, MD;  Location: [facility] CATH LAB;  Service: Cardiovascular;;  . CORONARY STENT INTERVENTION N/A 05/18/2019   Procedure: CORONARY STENT INTERVENTION;  Surgeon: Leonie Man, MD;  Location: [place] CV LAB;  Service: Cardiovascular;  Laterality: N/A;  . CORONARY STENT PLACEMENT  2004   x2 LAD and RCA   . LEFT HEART CATH AND CORONARY ANGIOGRAPHY N/A 05/18/2019   Procedure: LEFT HEART CATH AND CORONARY ANGIOGRAPHY;  Surgeon: Leonie Man, MD;  Location: [place] CV LAB;  Service: Cardiovascular;  Laterality: N/A;  . LEFT HEART CATH AND CORONARY ANGIOGRAPHY N/A 06/11/2019   Procedure: LEFT HEART CATH AND CORONARY ANGIOGRAPHY;  Surgeon: Nelva Bush, MD;  Location: [place] CV LAB;  Service: Cardiovascular;  Laterality: N/A;  . LEFT HEART CATHETERIZATION WITH CORONARY ANGIOGRAM N/A 08/02/2014   Procedure: LEFT HEART CATHETERIZATION WITH CORONARY ANGIOGRAM;  Surgeon: Jettie Booze, MD;  Location: [facility] CATH LAB;  Service: Cardiovascular;  Laterality: N/A;  . PROSTATE SURGERY    . SPINAL FUSION     L4-S1       Family History  Problem Relation Age of Onset  . Heart attack Mother   . Heart attack Father   .  Stroke Neg Hx     Social History   Tobacco Use  . Smoking status: Never Smoker  . Smokeless tobacco: Never Used  Substance Use Topics  . Alcohol use: No  . Drug use: No    Home Medications Prior to Admission medications   Medication Sig Start Date End Date Taking? Authorizing Provider  acetaminophen (TYLENOL) 650 MG CR tablet Take 1,300 mg by mouth every 8 (eight) hours as needed for pain.    [provider]  amLODipine (NORVASC) 10 MG tablet Take 10 mg by mouth daily. 11/28/17   [provider]  apixaban (ELIQUIS) 5 MG TABS tablet Take 1 tablet (5 mg total) by mouth 2 (two) times daily. DO NOT TAKE FOR 3 MORE DAYS. MAY RESTART TAKING IT ON 01/01/2018. 12/28/17   Vernell Leep  D, MD  carvedilol (COREG) 3.125 MG tablet Take 3.125 mg by mouth 2 (two) times daily.  08/09/18   [provider]  Cholecalciferol (VITAMIN D3) 25 MCG (1000 UT) CAPS Take 1 capsule by mouth daily.    [provider]  clopidogrel (PLAVIX) 75 MG tablet Take 1 tablet (75 mg total) by mouth daily. 05/20/19   Leone BrandIngold, Laura R, NP  Coenzyme Q10 100 MG capsule Take 1 capsule by mouth daily.    [provider]  diltiazem (CARDIZEM) 30 MG tablet Take 1 tablet (30 mg total) by mouth as needed (Rapid Palpitations). 01/31/18   Tereso NewcomerWeaver, Scott T, PA-C  fluticasone (FLONASE) 50 MCG/ACT nasal spray Place 2 sprays into both nostrils daily as needed for allergies or rhinitis.  03/31/15   [provider]  hydroxypropyl methylcellulose / hypromellose (ISOPTO TEARS / GONIOVISC) 2.5 % ophthalmic solution Place 1 drop into both eyes 3 (three) times daily as needed for dry eyes.    [provider]  Insulin Degludec 100 UNIT/ML SOLN Inject 22 Units into the skin every morning.    [provider]  isosorbide mononitrate (IMDUR) 60 MG 24 hr tablet Take 60 mg by mouth daily.     [provider]  lisinopril (PRINIVIL,ZESTRIL) 20 MG tablet Take 1 tablet (20 mg total) by  mouth daily. 10/31/17   Tereso NewcomerWeaver, Scott T, PA-C  nitroGLYCERIN (NITROSTAT) 0.4 MG SL tablet Place 1 tablet (0.4 mg total) under the tongue every 5 (five) minutes as needed for chest pain (x 3 doses). 06/12/19   Nahser, Deloris PingPhilip J, MD  Omega-3 Fatty Acids (FISH OIL) 1000 MG CAPS Take 4 capsules (4,000 mg total) by mouth daily. 06/03/16   Bhagat, Sharrell KuBhavinkumar, PA  triamcinolone cream (KENALOG) 0.5 % Apply 1 application topically as needed for irritation. 08/24/17   [provider]    Allergies    Bempedoic acid, [name] [alirocumab], Rosuvastatin, and Zetia [ezetimibe]  Review of Systems   Review of Systems  Constitutional: Negative for diaphoresis and fever.  Eyes: Negative for redness.  Respiratory: Positive for shortness of breath. Negative for cough.   Cardiovascular: Positive for chest pain. Negative for palpitations and leg swelling.  Gastrointestinal: Negative for abdominal pain, nausea and vomiting.  Genitourinary: Negative for dysuria.  Musculoskeletal: Negative for back pain and neck pain.  Skin: Negative for rash.  Neurological: Negative for syncope and light-headedness.  Psychiatric/Behavioral: The patient is not nervous/anxious.     Physical Exam Updated Vital Signs BP 125/65 (BP Location: Right Arm)   Pulse 63   Temp 97.7 F (36.5 C) (Oral)   Resp 16   Ht 6\' 2"  (1.88 m)   Wt 95.3 kg   SpO2 95%   BMI 26.96 kg/m   Physical Exam Vitals and nursing note reviewed.  Constitutional:      Appearance: He is well-developed. He is not diaphoretic.  HENT:     Head: Normocephalic and atraumatic.     Mouth/Throat:     Mouth: Mucous membranes are not dry.  Eyes:     Conjunctiva/sclera: Conjunctivae normal.  Neck:     Vascular: Normal carotid pulses. No carotid bruit or JVD.     Trachea: Trachea normal. No tracheal deviation.  Cardiovascular:     Rate and Rhythm: Normal rate and regular rhythm.     Pulses: No decreased pulses.     Heart sounds: Normal heart sounds,  S1 normal and S2 normal. Heart sounds not distant. No murmur.  Pulmonary:  Effort: Pulmonary effort is normal. No respiratory distress.     Breath sounds: Normal breath sounds. No wheezing.  Chest:     Chest wall: No tenderness.  Abdominal:     General: Bowel sounds are normal.     Palpations: Abdomen is soft.     Tenderness: There is no abdominal tenderness. There is no guarding or rebound.  Musculoskeletal:     Cervical back: Normal range of motion and neck supple. No muscular tenderness.  Skin:    General: Skin is warm and dry.     Coloration: Skin is not pale.  Neurological:     Mental Status: He is alert.     ED Results / Procedures / Treatments   Labs (all labs ordered are listed, but only abnormal results are displayed) Labs Reviewed  BASIC METABOLIC PANEL - Abnormal; Notable for the following components:      Result Value   Glucose, Bld 302 (*)    Creatinine, Ser 1.28 (*)    GFR calc non Af Amer 52 (*)    All other components within normal limits  TROPONIN I (HIGH SENSITIVITY) - Abnormal; Notable for the following components:   Troponin I (High Sensitivity) 33 (*)    All other components within normal limits  TROPONIN I (HIGH SENSITIVITY) - Abnormal; Notable for the following components:   Troponin I (High Sensitivity) 29 (*)    All other components within normal limits  RESPIRATORY PANEL BY RT PCR (FLU A&B, COVID)  CBC    ED ECG REPORT   Date: 11/11/2019  Rate: 68  Rhythm: normal sinus rhythm  QRS Axis: normal  Intervals: PR prolonged  ST/T Wave abnormalities: normal  Conduction Disutrbances:first-degree A-V block   Narrative Interpretation: septal q  Old EKG Reviewed: unchanged  I have personally reviewed the EKG tracing and agree with the computerized printout as noted.  Radiology DG Chest 2 View  Result Date: 11/11/2019 CLINICAL DATA:  Chest pain. EXAM: CHEST - 2 VIEW COMPARISON:  June 07, 2019 FINDINGS: The heart size and mediastinal  contours are within normal limits. Both lungs are clear. The visualized skeletal structures are unremarkable. IMPRESSION: No active cardiopulmonary disease. Electronically Signed   By: Gerome Sam III M.D   On: 11/11/2019 10:01    Procedures Procedures (including critical care time)  Medications Ordered in ED Medications  sodium chloride flush (NS) 0.9 % injection 3 mL (has no administration in time range)    ED Course  I have reviewed the triage vital signs and the nursing notes.  Pertinent labs & imaging results that were available during my care of the patient were reviewed by me and considered in my medical decision making (see chart for details).  Patient seen and examined.  Reviewed EKG which appears stable.  First troponin is slightly elevated but appears to be at the patient's baseline.  Will require second cardiac marker.  Discussed with Dr. Charm Barges.  Will request cardiology consult given patient's history.  Vital signs reviewed and are as follows: BP 125/65 (BP Location: Right Arm)   Pulse 63   Temp 97.7 F (36.5 C) (Oral)   Resp 16   Ht 6\' 2"  (1.88 m)   Wt 95.3 kg   SpO2 95%   BMI 26.96 kg/m   2:49 PM Cardiology has seen. Plan for admit. Trop 33 > 29.   Clinical Course as of Nov 11 1438  Sun Nov 11, 2019  [id number] 79 year old male with history of cardiac disease and stents  here with burning chest pain since last evening.  Similar to his cardiac events.  EKG no significant change initial troponin equivocal at 33.  Cardiology has been consulted.  Disposition per cardiology recommendations.   [MB]    Clinical Course User Index [MB] Terrilee Files, MD   MDM Rules/Calculators/A&P                      Admit.   Final Clinical Impression(s) / ED Diagnoses Final diagnoses:  Unstable angina [facility])    Rx / DC Orders ED Discharge Orders    None       Renne Crigler, PA-C 11/11/19 1450    Terrilee Files, MD 11/11/19 2204

## 2019-11-11 NOTE — ED Triage Notes (Addendum)
Pt to triage via [facility].  C/o L sided chest pain/burning since 4pm yesterday. Denies SOB, nausea, and vomiting.  Took ASA at home prior to EMS arrival.

## 2019-11-12 DIAGNOSIS — I10 Essential (primary) hypertension: Secondary | ICD-10-CM

## 2019-11-12 DIAGNOSIS — I208 Other forms of angina pectoris: Secondary | ICD-10-CM

## 2019-11-12 DIAGNOSIS — E782 Mixed hyperlipidemia: Secondary | ICD-10-CM

## 2019-11-12 DIAGNOSIS — I25110 Atherosclerotic heart disease of native coronary artery with unstable angina pectoris: Secondary | ICD-10-CM | POA: Diagnosis not present

## 2019-11-12 LAB — BASIC METABOLIC PANEL
Anion gap: 9 (ref 5–15)
BUN: 27 mg/dL — ABNORMAL HIGH (ref 8–23)
CO2: 23 mmol/L (ref 22–32)
Calcium: 9.4 mg/dL (ref 8.9–10.3)
Chloride: 104 mmol/L (ref 98–111)
Creatinine, Ser: 1.47 mg/dL — ABNORMAL HIGH (ref 0.61–1.24)
GFR calc Af Amer: 51 mL/min — ABNORMAL LOW (ref >60)
GFR calc non Af Amer: 44 mL/min — ABNORMAL LOW (ref >60)
Glucose, Bld: 198 mg/dL — ABNORMAL HIGH (ref 70–99)
Potassium: 4.2 mmol/L (ref 3.5–5.1)
Sodium: 136 mmol/L (ref 135–145)

## 2019-11-12 LAB — CBG MONITORING, ED: Glucose-Capillary: 197 mg/dL — ABNORMAL HIGH (ref 70–99)

## 2019-11-12 MED ORDER — RANOLAZINE ER 500 MG PO TB12: 500.0000 mg | ORAL_TABLET | Freq: Two times a day (BID) | ORAL | 11 refills | Status: DC

## 2019-11-12 MED ORDER — SODIUM CHLORIDE 0.9 % IV SOLN: INTRAVENOUS | Status: DC

## 2019-11-12 NOTE — ED Notes (Signed)
CBG 197 and reported to attending RN

## 2019-11-12 NOTE — Discharge Summary (Addendum)
Discharge Summary    Patient ID: Joseph [name] MRN: [id number]; DOB: 21-Dec-1940  Admit date: 11/11/2019 Discharge date: 11/12/2019  Primary Care Provider: Dema Severin, NP  Primary Cardiologist: Kristeen Miss, MD  Primary Electrophysiologist:  None   Discharge Diagnoses    Active Problems:   Chest pain   Allergies Allergies  Allergen Reactions  . Bempedoic Acid Other (See Comments)    NEXLETOL Myalgias, GI upset  . [name] [Alirocumab] Other (See Comments)    Muscle weakness and rash  . Rosuvastatin Other (See Comments)    Low energy/leg and hip pain  . Zetia [Ezetimibe] Other (See Comments)    myalgias    Diagnostic Studies/Procedures    CXR>results below _____________   History of Present Illness     Joseph [name] is a 79 y.o. male with hx MI 2004 s/p DES LAD & RCA, NSTEMI 2016 s/p DES LAD & CFX, cath 04/2019 w/ med rx, DM, HTN, HLD, PAF on Eliquis, who was admitted 04/25 with chest pain  Hospital Course     Consultants: none   He had minimally elevated troponins. He was restarted on Ranexa and his Imdur was continued.  He was in Afib at one point, but spontaneously converted to SR. Anticoagulation was continued.  His Cr increased slightly overnight and he was hydrated.   His A1c was elevated. He admitted to Na+ indiscretion. Compliance w/ low-Na diabetic diet was encouraged.  On 04/26, he was seen by Dr Elease Hashimoto and all data were reviewed. No further inpatient workup is indicated and he is considered stable for discharge, to follow up as and outpt.   Did the patient have an acute coronary syndrome (MI, NSTEMI, STEMI, etc) this admission?:  No                               Did the patient have a percutaneous coronary intervention (stent / angioplasty)?:  No.   _____________  Discharge Vitals Blood pressure (!) 157/72, pulse 69, temperature 97.7 F (36.5 C), temperature source Oral, resp. rate 14, height 6\' 2"  (1.88 m), weight 95.3 kg, SpO2 99 %.    Filed Weights   11/11/19 0913  Weight: 95.3 kg    Labs & Radiologic Studies    CBC Recent Labs    11/11/19 0914  WBC 10.2  HGB 14.8  HCT 42.9  MCV 92.3  PLT 273   Basic Metabolic Panel Recent Labs    11/13/19 0914 11/12/19 0334  NA 136 136  K 4.8 4.2  CL 102 104  CO2 24 23  GLUCOSE 302* 198*  BUN 19 27*  CREATININE 1.28* 1.47*  CALCIUM 9.9 9.4   Liver Function Tests No results for input(s): AST, ALT, ALKPHOS, BILITOT, PROT, ALBUMIN in the last 72 hours. No results for input(s): LIPASE, AMYLASE in the last 72 hours. High Sensitivity Troponin:   Recent Labs  Lab 11/11/19 0914 11/11/19 1302  TROPONINIHS 33* 29*    BNP Invalid input(s): POCBNP D-Dimer No results for input(s): DDIMER in the last 72 hours. Hemoglobin A1C Recent Labs    11/11/19 2239  HGBA1C 8.8*   Fasting Lipid Panel No results for input(s): CHOL, HDL, LDLCALC, TRIG, CHOLHDL, LDLDIRECT in the last 72 hours. Thyroid Function Tests No results for input(s): TSH, T4TOTAL, T3FREE, THYROIDAB in the last 72 hours.  Invalid input(s): FREET3 _____________  DG Chest 2 View  Result Date: 11/11/2019 CLINICAL DATA:  Chest pain. EXAM:  CHEST - 2 VIEW COMPARISON:  June 07, 2019 FINDINGS: The heart size and mediastinal contours are within normal limits. Both lungs are clear. The visualized skeletal structures are unremarkable. IMPRESSION: No active cardiopulmonary disease. Electronically Signed   By: Dorise Bullion III M.D   On: 11/11/2019 10:01   Disposition   Pt is being discharged home today in good condition.  Follow-up Plans & Appointments    Follow-up Information    [facility] Follow up.   Specialty: Cardiology Why: Keep appt. Contact information: [address] [place] [phone]         Discharge Instructions    Diet - low sodium heart healthy   Complete by: As directed    Diet Carb Modified   Complete by: As  directed    Increase activity slowly   Complete by: As directed       Discharge Medications   Allergies as of 11/12/2019      Reactions   Bempedoic Acid Other (See Comments)   NEXLETOL Myalgias, GI upset   [name] [alirocumab] Other (See Comments)   Muscle weakness and rash   Rosuvastatin Other (See Comments)   Low energy/leg and hip pain   Zetia [ezetimibe] Other (See Comments)   myalgias      Medication List    TAKE these medications   acetaminophen 500 MG tablet Commonly known as: TYLENOL Take 1,000 mg by mouth as needed for moderate pain.   amLODipine 10 MG tablet Commonly known as: NORVASC Take 10 mg by mouth at bedtime.   apixaban 5 MG Tabs tablet Commonly known as: ELIQUIS Take 1 tablet (5 mg total) by mouth 2 (two) times daily. DO NOT TAKE FOR 3 MORE DAYS. MAY RESTART TAKING IT ON 01/01/2018.   carvedilol 3.125 MG tablet Commonly known as: COREG Take 3.125 mg by mouth 2 (two) times daily.   clopidogrel 75 MG tablet Commonly known as: PLAVIX Take 1 tablet (75 mg total) by mouth daily.   Coenzyme Q10 100 MG capsule Take 1 capsule by mouth daily.   diltiazem 30 MG tablet Commonly known as: Cardizem Take 1 tablet (30 mg total) by mouth as needed (Rapid Palpitations).   Fish Oil 1000 MG Caps Take 4 capsules (4,000 mg total) by mouth daily. What changed: how much to take   fluticasone 50 MCG/ACT nasal spray Commonly known as: FLONASE Place 2 sprays into both nostrils daily as needed for allergies or rhinitis.   hydroxypropyl methylcellulose / hypromellose 2.5 % ophthalmic solution Commonly known as: ISOPTO TEARS / GONIOVISC Place 1 drop into both eyes 3 (three) times daily as needed for dry eyes.   isosorbide mononitrate 120 MG 24 hr tablet Commonly known as: IMDUR Take 120 mg by mouth daily.   lisinopril 20 MG tablet Commonly known as: ZESTRIL Take 1 tablet (20 mg total) by mouth daily.   nitroGLYCERIN 0.4 MG SL tablet Commonly known as:  NITROSTAT Place 1 tablet (0.4 mg total) under the tongue every 5 (five) minutes as needed for chest pain (x 3 doses).   ranolazine 500 MG 12 hr tablet Commonly known as: RANEXA Take 1 tablet (500 mg total) by mouth 2 (two) times daily. What changed:   when to take this  reasons to take this   Tresiba FlexTouch 100 UNIT/ML FlexTouch Pen Generic drug: insulin degludec Inject 30 Units into the skin daily.   triamcinolone cream 0.5 % Commonly known as: KENALOG Apply 1 application topically as  needed for irritation.   Vitamin D3 25 MCG (1000 UT) Caps Take 1 capsule by mouth daily.          Outstanding Labs/Studies   None   Duration of Discharge Encounter   Greater than 30 minutes including physician time.  Signed, Theodore Demark, PA-C 11/12/2019, 2:33 PM   Attending Note:   The patient was seen and examined.  Agree with assessment and plan as noted above.  Changes made to the above note as needed.  Patient seen and independently examined with Theodore Demark, PA .   We discussed all aspects of the encounter. I agree with the assessment and plan as stated above.  1.    Unstable angina: [name] presents with symptoms of unstable angina.  He has known coronary artery disease.  Last heart catheterization was in November, 2020.  He has diffuse disease with patent stents in his LAD, left circumflex and right coronary artery.  There is a moderate amount of in-stent restenosis in the LAD.  Medical therapy was recommended.  At this time he has minimally elevated troponin levels.  I suspect this is due to demand ischemia.  His blood pressure is elevated.  It is clear that he is still eating some salty foods.  We had a long discussion about reducing the salt in his diet.  We will also start him on Ranexa 500 mg twice a day.  We will have him see an APP in 2 to 3 weeks.  2.  Hypertension: Continue current medications.  He needs to reduce all of the extra salt in his diet.  3.   Hyperlipidemia: He is basically intolerant to statins and PCSK9's.  We will continue to look for other therapies.  He needs to really work on a low-fat low-cholesterol diet.   I have spent a total of 40 minutes with patient reviewing hospital  notes , telemetry, EKGs, labs and examining patient as well as establishing an assessment and plan that was discussed with the patient. > 50% of time was spent in direct patient care.    Vesta Mixer, Montez Hageman., MD, [facility] 11/13/2019, 12:37 PM [address],  [address] Office [phone] Pager [phone]

## 2019-11-12 NOTE — ED Notes (Signed)
Discharge instruction provided to patient as per orders. All questions answered. Follow up instructions provided to patient.

## 2019-11-12 NOTE — Progress Notes (Addendum)
Progress Note  [name] Date of Encounter: 11/12/2019  Primary Cardiologist:  Mertie Moores, MD  Subjective   No more chest pain, no shortness of breath. Does not know why he stopped the Ranexa previously. Would like to go home.  Inpatient Medications    Scheduled Meds: . amLODipine  10 mg Oral Daily  . apixaban  5 mg Oral BID  . carvedilol  3.125 mg Oral BID  . clopidogrel  75 mg Oral Daily  . insulin aspart  0-15 Units Subcutaneous TID WC  . isosorbide mononitrate  90 mg Oral Daily  . lisinopril  20 mg Oral Daily  . ranolazine  500 mg Oral BID  . sodium chloride flush  3 mL Intravenous Once   Continuous Infusions:  PRN Meds: acetaminophen, diltiazem, fluticasone, hydroxypropyl methylcellulose / hypromellose, nitroGLYCERIN, ondansetron (ZOFRAN) IV   Vital Signs    Vitals:   11/12/19 0300 11/12/19 0400 11/12/19 0500 11/12/19 0600  BP: 120/62 112/66 117/61 116/71  Pulse: (!) 55 (!) 54 (!) 58 (!) 57  Resp: 18 19 19  (!) 22  Temp:      TempSrc:      SpO2: 94% 95% 95% 92%  Weight:      Height:       No intake or output data in the 24 hours ending 11/12/19 0802 Filed Weights   11/11/19 0913  Weight: 95.3 kg   Last Weight  Most recent update: 11/11/2019  9:14 AM   Weight  95.3 kg (210 lb)           Weight change:    Telemetry    Sinus rhythm- Personally Reviewed  ECG    None today- Personally Reviewed  Physical Exam   General: Well developed, well nourished, male appearing in no acute distress. Head: Normocephalic, atraumatic.  Neck: Supple without bruits, JVD not elevated. Lungs:  Resp regular and unlabored, CTA. Heart: RRR, S1, S2, no S3, S4, or murmur; no rub. Abdomen: Soft, non-tender, non-distended with normoactive bowel sounds. No hepatomegaly. No rebound/guarding. No obvious abdominal masses. Extremities: No clubbing, cyanosis, no edema. Distal pedal pulses are 2+ bilaterally. Neuro: Alert and oriented X 3. Moves all  extremities spontaneously. Psych: Normal affect.  Labs    Hematology Recent Labs  Lab 11/11/19 0914  WBC 10.2  RBC 4.65  HGB 14.8  HCT 42.9  MCV 92.3  MCH 31.8  MCHC 34.5  RDW 12.1  PLT 273    Chemistry Recent Labs  Lab 11/11/19 0914 11/12/19 0334  NA 136 136  K 4.8 4.2  CL 102 104  CO2 24 23  GLUCOSE 302* 198*  BUN 19 27*  CREATININE 1.28* 1.47*  CALCIUM 9.9 9.4  GFRNONAA 52* 44*  GFRAA >60 51*  ANIONGAP 10 9     High Sensitivity Troponin:   Recent Labs  Lab 11/11/19 0914 11/11/19 1302  TROPONINIHS 33* 29*      BNPNo results for input(s): BNP, PROBNP in the last 168 hours.   DDimer No results for input(s): DDIMER in the last 168 hours.   Radiology    DG Chest 2 View  Result Date: 11/11/2019 CLINICAL DATA:  Chest pain. EXAM: CHEST - 2 VIEW COMPARISON:  June 07, 2019 FINDINGS: The heart size and mediastinal contours are within normal limits. Both lungs are clear. The visualized skeletal structures are unremarkable. IMPRESSION: No active cardiopulmonary disease. Electronically Signed   By: Dorise Bullion III M.D   On: 11/11/2019 10:01  Cardiac Studies   None this admission  Patient Profile     79 y.o. 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 Chest pain - minimal elevation in troponin - Imdur increased and Ranexa restarted -Symptoms have resolved -Discuss with MD, discharge when medically stable  2. PAF - in SR -Continue Eliquis  3.  Renal insufficiency -Creatinine increased overnight, will hydrate  Active Problems:   Chest pain    Melida Quitter , PA-C 8:02 AM 11/12/2019 Pager: [phone]  Attending Note:   The patient was seen and examined.  Agree with assessment and plan as noted above.  Changes made to the above note as needed.  Patient seen and independently examined with  Theodore Demark, PA .   We discussed all aspects of the encounter. I agree with the assessment and plan as stated above.  1.    Unstable angina: [name] presents with symptoms of unstable angina.  He has known coronary artery disease.  Last heart catheterization was in November, 2020.  He has diffuse disease with patent stents in his LAD, left circumflex and right coronary artery.  There is a moderate amount of in-stent restenosis in the LAD.  Medical therapy was recommended.  At this time he has minimally elevated troponin levels.  I suspect this is due to demand ischemia.  His blood pressure is elevated.  It is clear that he is still eating some salty foods.  We had a long discussion about reducing the salt in his diet.  We will also start him on Ranexa 500 mg twice a day.  We will have him see an APP in 2 to 3 weeks.  2.  Hypertension: Continue current medications.  He needs to reduce all of the extra salt in his diet.  3.  Hyperlipidemia: He is basically intolerant to statins and PCSK9's.  We will continue to look for other therapies.  He needs to really work on a low-fat low-cholesterol diet.      I have spent a total of 40 minutes with patient reviewing hospital  notes , telemetry, EKGs, labs and examining patient as well as establishing an assessment and plan that was discussed with the patient. > 50% of time was spent in direct patient care.    Vesta Mixer, Montez Hageman., MD, [facility] 11/12/2019, 9:07 AM [address]  [address] Office [phone] Pager [phone]

## 2019-11-12 NOTE — ED Notes (Signed)
Breakfast tray given and pt eating now with no asst.

## 2019-11-12 NOTE — ED Notes (Signed)
Breakfast ordered 

## 2019-11-12 NOTE — ED Notes (Signed)
Pt. Denies any pain or discomforts. Vitals are WNL. No signs of distress.

## 2019-11-12 NOTE — Discharge Instructions (Signed)
Angina  Angina is very bad discomfort or pain in the chest, neck, arm, jaw, or back. The discomfort is caused by a lack of blood in the middle layer of the heart wall (myocardium). What are the causes? This condition is caused by a buildup of fat and cholesterol (plaque) in your arteries (atherosclerosis). This buildup narrows the arteries and makes it hard for blood to flow. What increases the risk? You are more likely to develop this condition if:  You have high levels of cholesterol in your blood.  You have high blood pressure (hypertension).  You have diabetes.  You have a family history of heart disease.  You are not active, or you do not exercise enough.  You feel sad (depressed).  You have been treated with high energy rays (radiation) on the left side of your chest. Other risk factors are:  Using tobacco.  Being very overweight (obese).  Eating a diet high in unhealthy fats (saturated fats).  Having stress, or being exposed to things that cause stress.  Using drugs, such as cocaine. Women have a greater risk for angina if:  They are older than 55.  They have stopped having their period (are in postmenopause). What are the signs or symptoms? Common symptoms of this condition in both men and women may include:  Chest pain, which may: ? Feel like a crushing or squeezing in the chest. ? Feel like a tightness, pressure, fullness, or heaviness in the chest. ? Last for more than a few minutes at a time. ? Stop and come back (recur) after a few minutes.  Pain in the neck, arm, jaw, or back.  Heartburn or upset stomach (indigestion) for no reason.  Being short of breath.  Feeling sick to your stomach (nauseous).  Sudden cold sweats. Women and people with diabetes may have other symptoms that are not usual, such as feeling:  Tired (fatigue).  Worried or nervous (anxious) for no reason.  Weak for no reason.  Dizzy or passing out (fainting). How is this  treated? This condition may be treated with:  Medicines. These are given to: ? Prevent blood clots. ? Prevent heart attack. ? Relax blood vessels and improve blood flow to the heart (nitrates). ? Reduce blood pressure. ? Improve the pumping action of the heart. ? Reduce fat and cholesterol in the blood.  A procedure to widen a narrowed or blocked artery in the heart (angioplasty).  Surgery to allow blood to go around a blocked artery (coronary artery bypass surgery). Follow these instructions at home: Medicines  Take over-the-counter and prescription medicines only as told by your doctor.  Do not take these medicines unless your doctor says that you can: ? NSAIDs. These include:  Ibuprofen.  Naproxen. ? Vitamin supplements that have vitamin A, vitamin E, or both. ? Hormone therapy that contains estrogen with or without progestin. Eating and drinking   Eat a heart-healthy diet that includes: ? Lots of fresh fruits and vegetables. ? Whole grains. ? Low-fat (lean) protein. ? Low-fat dairy products.  Follow instructions from your doctor about what you cannot eat or drink. Activity  Follow an exercise program that your doctor tells you.  Talk with your doctor about joining a program to help improve the health of your heart (cardiac rehab).  When you feel tired, take a break. Plan breaks if you know you are going to feel tired. Lifestyle   Do not use any products that contain nicotine or tobacco. This includes cigarettes, e-cigarettes, and   chewing tobacco. If you need help quitting, ask your doctor.  If your doctor says you can drink alcohol: ? Limit how much you use to:  0-1 drink a day for women who are not pregnant.  0-2 drinks a day for men. ? Be aware of how much alcohol is in your drink. In the [place] one drink equals:  One 12 oz bottle of beer (355 mL).  One 5 oz glass of wine (148 mL).  One 1 oz glass of hard liquor (44 mL). General instructions  Stay  at a healthy weight. If your doctor tells you to do so, work with him or her to lose weight.  Learn to deal with stress. If you need help, ask your doctor.  Keep your vaccines up to date. Get a flu shot every year.  Talk with your doctor if you feel sad. Take a screening test to see if you are at risk for depression.  Work with your doctor to manage any other health problems that you have. These may include diabetes or high blood pressure.  Keep all follow-up visits as told by your doctor. This is important. Get help right away if:  You have pain in your chest, neck, arm, jaw, or back, and the pain: ? Lasts more than a few minutes. ? Comes back. ? Does not get better after you take medicine under your tongue (sublingual nitroglycerin). ? Keeps getting worse. ? Comes more often.  You have any of these problems for no reason: ? Sweating a lot. ? Heartburn or upset stomach. ? Shortness of breath. ? Trouble breathing. ? Feeling sick to your stomach. ? Throwing up (vomiting). ? Feeling more tired than normal. ? Feeling nervous or worrying more than normal. ? Weakness.  You are suddenly dizzy or light-headed.  You pass out. These symptoms may be an emergency. Do not wait to see if the symptoms will go away. Get medical help right away. Call your local emergency services (911 in the [place] Do not drive yourself to the hospital. Summary  Angina is very bad discomfort or pain in the chest, neck, arm, neck, or back.  Symptoms include chest pain, heartburn or upset stomach for no reason, and shortness of breath.  Women or people with diabetes may have symptoms that are not usual, such as feeling nervous or worried for no reason, weak for no reason, or tired.  Take all medicines only as told by your doctor.  You should eat a heart-healthy diet and follow an exercise program. This information is not intended to replace advice given to you by your health care provider. Make sure you  discuss any questions you have with your health care provider. Document Revised: 02/20/2018 Document Reviewed: 02/20/2018 Elsevier Patient Education  2020 [employer]  

## 2019-11-12 NOTE — ED Notes (Signed)
100% of tray eaten.

## 2019-11-21 ENCOUNTER — Telehealth: Payer: Self-pay | Admitting: Pharmacist

## 2019-11-21 NOTE — Telephone Encounter (Signed)
Patient called.  Discharged from ED on 4/26 for unstable angina and discharging provider restarted Ranexa.  He had previously self discontinued Ranexa in February 2021 due to it making him feel weak and unsteady.  Since restarting medication he reports the same symptoms have returned.  He self discontinued Ranexa again 2 days ago and now reports he feels much better.    Is also currently on Imdur 120mg  and reports no adverse effects from that.  Has next cardio appointment on 5/18.  Will route to Dr. 6/18 as Elease Hashimoto and for additional input.

## 2019-11-21 NOTE — Telephone Encounter (Signed)
Continue imdur Ranexa has been stopped due to side effects

## 2019-12-03 NOTE — Progress Notes (Signed)
Cardiology Office Note:    Date:  12/04/2019   ID:  Joseph [name], DOB 26-Oct-1940, MRN [id number]  PCP:  Imagene Riches, NP  Cardiologist:  Mertie Moores, MD  Electrophysiologist:  None   Referring MD: Imagene Riches, NP   Chief Complaint:  Hospitalization Follow-up (admx w chest pain >> Med Rx)    Patient Profile:    Joseph [name] is a 79 y.o. male with:   Coronary artery disease   S/p anterior STEMI in 2004 >> tx with stent to LAD  Staged PCI with Taxus DES to RCA  NSTEMI in 1/16 >> tx with DES to mLAD and DES to mid and dist LCx  [place] 04/2019: s/p DES x 2 to dist RCA/PDA  Cath 05/2019: LAD, LCx, RCA stents patent; jailed D2 70-75, RI 100, OM2 2 >> med Rx   Paroxysmal atrial fibrillation  CHA2DS2-VASc=7 (age x 2, Diab, CAD, HTN, CVA) >> Apixaban   Hypertension   Hyperlipidemia   Statin intolerant   Diabetes mellitus   Prior CVA   Hx of LGI bleed after polypectomy in 6/19  Prior CV studies: Echocardiogram 06/08/2019 EF 55-60, mod LVH, ant/sept HK, inf HK, normal RVSF, mild dilation of asc Ao (41 mm)  Cardiac catheterization 06/11/2019 LAD prox 59, mid stent 37 ISR; D2 (jailed) 52 prox and 48 mid; D3 74 RI 100 (CTO) LCx stents patent; OM2 90 (not amenable to PCI) RCA prox stent patent w/ 5 ISR, dist stent patent; RPDA stent patent  Cardiac catheterization 05/18/2019 Distal RCA 90, RPDA 70 PCI:  DES x 2 to distal RCA/PDA  Echocardiogram 05/18/2019 EF 55-60, mod LVH, HK c/w LAD territory ischemia, Gr 1 DD, normal RVSF  Myoview 11/22/2018 EF 60, small apical septal infarct, no ischemia, low risk   Nuclear stress test 11/14/17 Low risk stress nuclear study with a fixed anteroseptal perfusion defect and borderline reduction in left ventricular systolic function. EF 54  Echo 09/30/17 Mild concentric LVH, EF 55-60, normal wall motion, grade 2 diastolic dysfunction, mildly dilated ascending aorta (38 mm), mild MR, mild LAE, PASP 26  Event monitor  06/09/16 NSR. No atrial fibrillation noted.  Nuclear stress test 04/08/16 Large, fixed anteroseptal perfusion defect with associated akinesis suggestive of scar. No significant reversible ischemia. LVEF 55%. This is an intermediate risk study based on infarct size (20%).  Cardiac Catheterization4/27/16 LM distal 30-40 LAD mid stent patent; D1 proximal 80-90 (med rx recommended) LCx stents in the mid AV LCx patent; OM1 proximal 100; OM3 proximal 90 (small) RCA irregularities; PLV serial 50 (small); PDA mid 40-50, distal 30  Echo 10/31/14 Moderate concentric LVH, EF 60-65, normal wall motion, grade 1 diastolic dysfunction, ascending aorta 41 mm, moderate TR  History of Present Illness:    Mr. [name] was last seen by Dr. Acie Fredrickson in clinic in 08/2019.  He had stopped Ranolazine on his own due to side effects.  He was admitted 4/25-4/26 with unstable angina.  He had minimally elevated hs-Troponin levels without a significant delta.  This was felt to be due to demand ischemia.  His Ranolazine was resumed.  He returns for post hospitalization follow up.  He is here alone.  He could not tolerate ranolazine.  It caused chest and epigastric burning.  Those symptoms resolved after stopping the medication.  He tried to resume it a week later and had the same symptoms again.  He is no longer on ranolazine.  He has shortness of breath with some activities.  This seems  to be more noticeable in the last several weeks.  He has a lot of leg pain.  This seems to be worse with exertion later in the day.  He also has lumbar spine disease.  He has had previous back surgery.  He has some tingling in his feet at times.  He sleeps in a recliner.  He has not had syncope.  He has not had lower extremity swelling.  Past Medical History:  Diagnosis Date  . Abdominal discomfort    Abdominal discomfort: EGD 2011 showed no significant abnormalities.  Possibly due to metformin.  . Allergic rhinitis   . Anticoagulated  05/30/2016  . CAD (coronary artery disease)    a. s/p MI in 2004:  stents to the LAD and staged stent RCA, EF 55%;  b.  NSTEMI (1/16):  LHC - mid LAD stent with diff dist restenosis, prox to mid Dx mod dsz jailed by stent, small OM1 99, mCFX 75, dCFX 80, mRCA stent ok, mPDA mild to mod dsz, EF 55% >> PCI:  Synergy DES to LAD; Synergy DES (x2) mid and dist CFX  . CAD in native artery 06/12/2019  . 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  s/p back surgery  . DM (diabetes mellitus) (HCC)   . History of CVA (cerebrovascular accident)    Small CVA seen by head CT (incidental) in 1/13.  Carotid dopplers in 1/13 showed minimal disease  . History of Doppler ultrasound    Arterial dopplers (3/11): no evidence for significant PAD. ABIs 10/12: Normal.    . HLD (hyperlipidemia)   . HTN (hypertension)    had side effects with chlorthalidone  . Hx of cardiovascular stress test    Myoview (1/13): Small fixed apical septal defect with no ischemia, EF 58%.  [name] Hx of echocardiogram    Echo (3/11): EF 60%, normal wall motion, mild MR, mild left atrial enlargement, mildly dilated ascending aorta.  [name] PAF (paroxysmal atrial fibrillation) (HCC) 05/29/2016  . 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 acetaminophen (TYLENOL) 500 MG tablet Take 1,000 mg by mouth as needed for moderate pain.  [name] amLODipine (NORVASC) 10 MG tablet Take 10 mg by mouth at bedtime.   [name] apixaban (ELIQUIS) 5 MG TABS tablet Take 1 tablet (5 mg total) by mouth 2 (two) times daily. DO NOT TAKE FOR 3 MORE DAYS. MAY RESTART TAKING IT ON 01/01/2018.  . carvedilol (COREG) 3.125 MG tablet Take 3.125 mg by mouth 2 (two) times daily.   . Cholecalciferol (VITAMIN D3) 25 MCG (1000 UT) CAPS Take 1 capsule by mouth daily.  . clopidogrel (PLAVIX) 75 MG tablet Take 1 tablet (75 mg total) by mouth daily.  . Coenzyme Q10 100 MG capsule Take 1 capsule by  mouth daily.  [name] diltiazem (CARDIZEM) 30 MG tablet Take 1 tablet (30 mg total) by mouth as needed (Rapid Palpitations).  . fluticasone (FLONASE) 50 MCG/ACT nasal spray Place 2 sprays into both nostrils daily as needed for allergies or rhinitis.   . hydroxypropyl methylcellulose / hypromellose (ISOPTO TEARS / GONIOVISC) 2.5 % ophthalmic solution Place 1 drop into both eyes 3 (three) times daily as needed for dry eyes.  [name] lisinopril (PRINIVIL,ZESTRIL) 20 MG tablet Take 1 tablet (20 mg total) by mouth daily.  . nitroGLYCERIN (NITROSTAT) 0.4 MG SL tablet Place 1 tablet (0.4 mg total) under the tongue every 5 (five) minutes  as needed for chest pain (x 3 doses).  . Omega-3 Fatty Acids (FISH OIL) 1000 MG CAPS Take 4 capsules (4,000 mg total) by mouth daily.  Evaristo Bury FLEXTOUCH 100 UNIT/ML FlexTouch Pen Inject 28 Units into the skin daily.   [name] triamcinolone cream (KENALOG) 0.5 % Apply 1 application topically as needed for irritation.  . [DISCONTINUED] isosorbide mononitrate (IMDUR) 120 MG 24 hr tablet Take 120 mg by mouth daily.  . [DISCONTINUED] ranolazine (RANEXA) 500 MG 12 hr tablet Take 1 tablet (500 mg total) by mouth 2 (two) times daily.     Allergies:   Bempedoic acid, [name] [alirocumab], Rosuvastatin, and Zetia [ezetimibe]   Social History   Tobacco Use  . Smoking status: Never Smoker  . Smokeless tobacco: Never Used  Substance Use Topics  . Alcohol use: No  . Drug use: No     Family Hx: The patient's family history includes Heart attack in his father and mother. There is no history of Stroke.  ROS   EKGs/Labs/Other Test Reviewed:    EKG:  EKG is  ordered today.  The ekg ordered today demonstrates normal sinus rhythm, heart rate 64, left axis deviation, first-degree AV block, PR interval 220 ms, septal Q waves, no ST-T wave changes, QTC 408, no change since prior tracing  Recent Labs: 05/17/2019: Magnesium 1.9; TSH 2.172 11/11/2019: Hemoglobin 14.8; Platelets 273 11/12/2019: BUN  27; Creatinine, Ser 1.47; Potassium 4.2; Sodium 136   Recent Lipid Panel Lab Results  Component Value Date/Time   CHOL 174 05/18/2019 02:10 AM   CHOL 114 07/21/2018 07:47 AM   TRIG 136 05/18/2019 02:10 AM   HDL 32 (L) 05/18/2019 02:10 AM   HDL 38 (L) 07/21/2018 07:47 AM   CHOLHDL 5.4 05/18/2019 02:10 AM   LDLCALC 115 (H) 05/18/2019 02:10 AM   LDLCALC 50 07/21/2018 07:47 AM   LDLDIRECT 83.0 10/28/2014 04:22 PM    Physical Exam:    VS:  BP 138/64   Pulse 64   Ht 6\' 2"  (1.88 m)   Wt 217 lb (98.4 kg)   SpO2 98%   BMI 27.86 kg/m     Wt Readings from Last 3 Encounters:  12/04/19 217 lb (98.4 kg)  11/11/19 210 lb (95.3 kg)  09/10/19 218 lb 8 oz (99.1 kg)     Constitutional:      Appearance: Healthy appearance. Not in distress.  Neck:     Thyroid: No thyromegaly.     Vascular: JVD normal.  Pulmonary:     Effort: Pulmonary effort is normal.     Breath sounds: No wheezing. No rales.  Cardiovascular:     Normal rate. Regular rhythm. Normal S1. Normal S2.     Murmurs: There is no murmur.  Pulses:    Decreased pulses (distally).  Edema:    Peripheral edema absent.  Abdominal:     Palpations: Abdomen is soft. There is no hepatomegaly.  Skin:    General: Skin is warm and dry.  Neurological:     General: No focal deficit present.     Mental Status: Alert and oriented to person, place and time.     Cranial Nerves: Cranial nerves are intact.       ASSESSMENT & PLAN:    1. Coronary artery disease involving native coronary artery of native heart with angina pectoris [facility]) History of anterior STEMI in 2004 treated with stent to the LAD and staged PCI with DES to the RCA.  He had a non-STEMI in January 2016  treated with a DES to the mid LAD and DES to the mid and distal LCx.  He was admitted with unstable angina in October 2020 and underwent stenting with a DES x2 to the distal RCA/PDA.  His last cardiac catheterization in November 2020 demonstrated patent stents in the LAD,  LCx and RCA.  His second diagonal was jailed with 70-75% stenosis.  The ramus intermediate was occluded and there was 90% stenosis in the OM2.  There were no targets for PCI and medical therapy was recommended.  He was recently admitted with chest discomfort with minimally elevated troponin levels.  This was felt to be related to demand ischemia and medical therapy was recommended.  Unfortunately, he could not tolerate ranolazine due to GI side effects.  He is on max dose amlodipine.  I do not think his heart rate will tolerate further increases in his beta-blocker.  Therefore, I recommended increasing his isosorbide to 180 mg daily.  If this causes dizziness or low blood pressure, we can try at 150 mg daily instead.  Continue clopidogrel.  He is intolerant of statins.  2. Paroxysmal atrial fibrillation (HCC) Maintaining sinus rhythm.  He is tolerating anticoagulation.  Recent creatinine stable.  Recent hemoglobin normal.  3. Essential hypertension Blood pressure controlled.  Continue current medications.  Adjust isosorbide for antianginal effect.  4. Pure hypercholesterolemia As noted, he is intolerant of statins as well as PCSK9 inhibitors  5. Claudication [facility]) He has bilateral lower extremity discomfort with walking.  This may be due to his lumbar spine disease.  However, he does have coronary artery disease and is a diabetic.  His last ABIs/arterial Dopplers were in 2016.  These were normal.  ABIs a couple years ago were unreliable due to calcified vessels.  I have recommended that we repeat the arterial dopplers to ensure that he does not have significant PAD.    -Arrange ABIs/arterial Dopplers.    Dispo:  Return in about 3 months (around 03/05/2020) for Routine Follow Up, w/ Dr. Elease Hashimoto, or Tereso Newcomer, PA-C, in person.   Medication Adjustments/Labs and Tests Ordered: Current medicines are reviewed at length with the patient today.  Concerns regarding medicines are outlined above.  Tests  Ordered: Orders Placed This Encounter  Procedures  . EKG 12-Lead  . VAS [place] LOWER EXTREMITY ARTERIAL DUPLEX  . VAS [place] ABI WITH/WO TBI   Medication Changes: Meds ordered this encounter  Medications  . isosorbide mononitrate (IMDUR) 120 MG 24 hr tablet    Sig: Take 1.5 tablets by mouth once a day    Dispense:  45 tablet    Refill:  6    Signed, Tereso Newcomer, PA-C  12/04/2019 9:04 PM    [facility] [address] [place], [place], [place]  [address] Phone: [phone]; Fax: [phone]

## 2019-12-04 ENCOUNTER — Other Ambulatory Visit: Payer: Self-pay

## 2019-12-04 ENCOUNTER — Ambulatory Visit (INDEPENDENT_AMBULATORY_CARE_PROVIDER_SITE_OTHER): Payer: Medicare Other | Admitting: Physician Assistant

## 2019-12-04 ENCOUNTER — Encounter: Payer: Self-pay | Admitting: Physician Assistant

## 2019-12-04 VITALS — BP 138/64 | HR 64 | Ht 74.0 in | Wt 217.0 lb

## 2019-12-04 DIAGNOSIS — I739 Peripheral vascular disease, unspecified: Secondary | ICD-10-CM

## 2019-12-04 DIAGNOSIS — I25119 Atherosclerotic heart disease of native coronary artery with unspecified angina pectoris: Secondary | ICD-10-CM

## 2019-12-04 DIAGNOSIS — E7800 Pure hypercholesterolemia, unspecified: Secondary | ICD-10-CM

## 2019-12-04 DIAGNOSIS — I200 Unstable angina: Secondary | ICD-10-CM

## 2019-12-04 DIAGNOSIS — I10 Essential (primary) hypertension: Secondary | ICD-10-CM | POA: Diagnosis not present

## 2019-12-04 DIAGNOSIS — I480 Paroxysmal atrial fibrillation: Secondary | ICD-10-CM | POA: Diagnosis not present

## 2019-12-04 MED ORDER — ISOSORBIDE MONONITRATE ER 120 MG PO TB24: ORAL_TABLET | ORAL | 6 refills | Status: DC

## 2019-12-04 NOTE — Patient Instructions (Signed)
Medication Instructions:   Your physician has recommended you make the following change in your medication:   1) Stop Ranexa 2) Increase Isosorbide to 180 mg, 1.5 tablets by mouth once a day  *If you need a refill on your cardiac medications before your next appointment, please call your pharmacy*  Lab Work:  None ordered today  Testing/Procedures:  Your physician has requested that you have a lower or upper extremity arterial duplex. This test is an ultrasound of the arteries in the legs or arms. It looks at arterial blood flow in the legs and arms. Allow one hour for Lower and Upper Arterial scans. There are no restrictions or special instructions  Your physician has requested that you have an ankle brachial index (ABI). During this test an ultrasound and blood pressure cuff are used to evaluate the arteries that supply the arms and legs with blood. Allow thirty minutes for this exam. There are no restrictions or special instructions.  Follow-Up: At [facility], you and your health needs are our priority.  As part of our continuing mission to provide you with exceptional heart care, we have created designated Provider Care Teams.  These Care Teams include your primary Cardiologist (physician) and Advanced Practice Providers (APPs -  Physician Assistants and Nurse Practitioners) who all work together to provide you with the care you need, when you need it.  We recommend signing up for the patient portal called "MyChart".  Sign up information is provided on this After Visit Summary.  MyChart is used to connect with patients for Virtual Visits (Telemedicine).  Patients are able to view lab/test results, encounter notes, upcoming appointments, etc.  Non-urgent messages can be sent to your provider as well.   To learn more about what you can do with MyChart, go to [email or web address].    Your next appointment:   3 month(s)  The format for your next appointment:   In  Person  Provider:   You may see Kristeen Miss, MD or Tereso Newcomer, PA-C

## 2019-12-05 ENCOUNTER — Telehealth: Payer: Self-pay | Admitting: Pharmacist

## 2019-12-05 MED ORDER — LISINOPRIL 20 MG PO TABS: 10.0000 mg | ORAL_TABLET | Freq: Every day | ORAL | 3 refills | Status: DC

## 2019-12-05 MED ORDER — ISOSORBIDE MONONITRATE ER 30 MG PO TB24
30.0000 mg | ORAL_TABLET | Freq: Every day | ORAL | 3 refills | Status: DC
Start: 2019-12-05 — End: 2019-12-14

## 2019-12-05 MED ORDER — ISOSORBIDE MONONITRATE ER 120 MG PO TB24: 120.0000 mg | ORAL_TABLET | Freq: Every day | ORAL | 6 refills | Status: DC

## 2019-12-05 NOTE — Telephone Encounter (Signed)
Decrease Lisinopril to 10 mg once daily. Send in Rx for Isosorbide 30 mg and take with 120 mg for total of 150 mg once daily. Monitor BP. Call if recurrent symptoms or BP < 110 systolic. Tereso Newcomer, PA-C    12/05/2019 4:51 PM

## 2019-12-05 NOTE — Telephone Encounter (Signed)
Patient called stating he took 1.5 isosorbide 124m tablets (1852m this AM as prescribed. He states that he met a friend for coffee and watered some plants and then all the sudden felt sick to his stomach, whoozy and felt like he was going to pass out. Denies chest pain. States he sat down for about an hour. Feels a little better now. BP was 117/67 HR 79. Wants to know when it will wear off. Advised that drug should be completely cleared from body in the AM. I have asked him to go back to 12083maily. I will send a message to scott to advise for the future dose. His note did mention trying 150 if he had hypotension. Patient denies any chest pain recently.

## 2019-12-05 NOTE — Addendum Note (Signed)
Addended by: Lajoyce Corners on: 12/05/2019 05:01 PM   Modules accepted: Orders

## 2019-12-05 NOTE — Telephone Encounter (Signed)
I called and spoke with patient, he is aware to decrease Lisinopril to 10 mg once a day and Isosorbide to 120 mg once a day. New prescriptions sent in and medication list updated.

## 2019-12-14 ENCOUNTER — Telehealth: Payer: Self-pay | Admitting: Pharmacist

## 2019-12-14 NOTE — Telephone Encounter (Signed)
Pt called directly to pharmacist line about his Imdur. States he is experiencing a headache, feels "sick on his stomach," and nervous. Looks like Lorin Picket has been adjusting this, most recently increased Imdur dose from 120mg  to 150mg . Advised pt I would forward his message to [facility] who has been managing this med for input and follow up. He verbalized understanding.

## 2019-12-14 NOTE — Telephone Encounter (Signed)
Decrease Isosorbide back to 120 mg once daily. Tereso Newcomer, PA-C    12/14/2019 8:13 AM

## 2019-12-14 NOTE — Telephone Encounter (Signed)
I called and spoke with patient, he is aware to decrease Isosorbide to 120 mg once a day. Patient will call if he is still experiencing headache, nausea, and nervous feeling on the 120 mg.

## 2019-12-19 ENCOUNTER — Ambulatory Visit (HOSPITAL_COMMUNITY)
Admission: RE | Admit: 2019-12-19 | Discharge: 2019-12-19 | Disposition: A | Discharge status: Home / Self Care | Payer: Medicare Other | Source: Ambulatory Visit | Attending: Cardiology | Admitting: Cardiology

## 2019-12-19 ENCOUNTER — Other Ambulatory Visit: Payer: Self-pay

## 2019-12-19 DIAGNOSIS — I739 Peripheral vascular disease, unspecified: Secondary | ICD-10-CM | POA: Insufficient documentation

## 2019-12-25 ENCOUNTER — Encounter: Payer: Medicare Other | Admitting: *Deleted

## 2019-12-25 ENCOUNTER — Other Ambulatory Visit: Payer: Self-pay

## 2019-12-25 DIAGNOSIS — Z006 Encounter for examination for normal comparison and control in clinical research program: Secondary | ICD-10-CM

## 2019-12-25 NOTE — Research (Signed)
Pt to research clinic for [facility] 4 randomization. Per Mordecai Rasmussen, unable to randomize pt due to Cardiac event. Next appointment scheduled for July 13th.

## 2020-01-29 ENCOUNTER — Other Ambulatory Visit: Payer: Self-pay

## 2020-01-29 ENCOUNTER — Encounter: Payer: Medicare Other | Admitting: *Deleted

## 2020-01-29 DIAGNOSIS — Z006 Encounter for examination for normal comparison and control in clinical research program: Secondary | ICD-10-CM

## 2020-01-29 NOTE — Research (Signed)
Subject Name: Joseph [name]  Subject met inclusion and exclusion criteria.  The informed consent form, study requirements and expectations were reviewed with the subject and questions and concerns were addressed prior to the signing of the consent form.  The subject verbalized understanding of the trial requirements.  The subject agreed to participate in the [name]  trial and signed the informed consent at 0810 on 01/29/20  The informed consent was obtained prior to performance of any protocol-specific procedures for the subject.  A copy of the signed informed consent was given to the subject and a copy was placed in the subject's medical record.   [name]   Patient to research clinic for screening in the [place] research study.  Met inclusion/exclusion.  Subject given injection and tolerated well. Next appointment scheduled

## 2020-02-21 DIAGNOSIS — J309 Allergic rhinitis, unspecified: Secondary | ICD-10-CM | POA: Insufficient documentation

## 2020-02-22 IMAGING — NM NM MYOCAR PERF WALL MOTION
5 series · 30 of 30 positions shown · non-contrast
Comparison: none

[Series 1: rest · 6.51mm/px · 6 of 64 frames shown]
[frame 6/64]
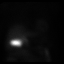
[frame 16/64]
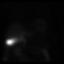
[frame 27/64]
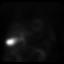
[frame 38/64]
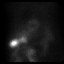
[frame 48/64]
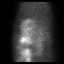
[frame 59/64]
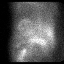

[Series 1: wbr_r-proj_st rest · 6.51mm/px · 6 of 64 frames shown]
[frame 6/64]
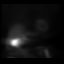
[frame 16/64]
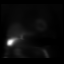
[frame 27/64]
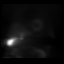
[frame 38/64]
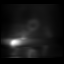
[frame 48/64]
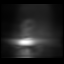
[frame 59/64]
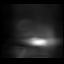

[Series 2: stress - gated · 6.51mm/px · 6 of 512 frames shown]
[frame 43/512]
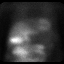
[frame 128/512]
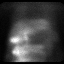
[frame 214/512]
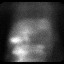
[frame 299/512]
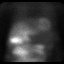
[frame 384/512]
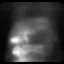
[frame 470/512]
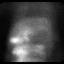

[Series 2: wbr_s-proj_st stress - gated · 6.51mm/px · 6 of 512 frames shown]
[frame 43/512]
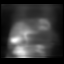
[frame 128/512]
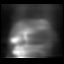
[frame 214/512]
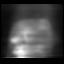
[frame 299/512]
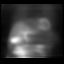
[frame 384/512]
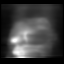
[frame 470/512]
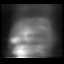

[Series 3: stress - perfusion · 6.51mm/px · 6 of 64 frames shown]
[frame 6/64]
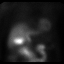
[frame 16/64]
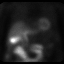
[frame 27/64]
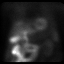
[frame 38/64]
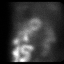
[frame 48/64]
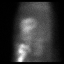
[frame 59/64]
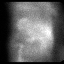

[30 of 30 positions shown; findings below may reference images not displayed]

Canned report from images found in remote index.

Refer to host system for actual result text.

## 2020-03-10 ENCOUNTER — Other Ambulatory Visit: Payer: Self-pay

## 2020-03-10 ENCOUNTER — Encounter: Payer: Self-pay | Admitting: Cardiovascular Disease

## 2020-03-10 ENCOUNTER — Ambulatory Visit (INDEPENDENT_AMBULATORY_CARE_PROVIDER_SITE_OTHER): Payer: Medicare Other | Admitting: Cardiovascular Disease

## 2020-03-10 VITALS — BP 132/70 | HR 68 | Ht 74.0 in | Wt 217.2 lb

## 2020-03-10 DIAGNOSIS — I2510 Atherosclerotic heart disease of native coronary artery without angina pectoris: Secondary | ICD-10-CM

## 2020-03-10 DIAGNOSIS — I200 Unstable angina: Secondary | ICD-10-CM

## 2020-03-10 DIAGNOSIS — Z9861 Coronary angioplasty status: Secondary | ICD-10-CM

## 2020-03-10 DIAGNOSIS — I480 Paroxysmal atrial fibrillation: Secondary | ICD-10-CM | POA: Diagnosis not present

## 2020-03-10 DIAGNOSIS — E782 Mixed hyperlipidemia: Secondary | ICD-10-CM

## 2020-03-10 NOTE — Patient Instructions (Signed)
Medication Instructions:  Your physician recommends that you continue on your current medications as directed. Please refer to the Current Medication list given to you today.  *If you need a refill on your cardiac medications before your next appointment, please call your pharmacy*   Lab Work: None If you have labs (blood work) drawn today and your tests are completely normal, you will receive your results only by: MyChart Message (if you have MyChart) OR A paper copy in the mail If you have any lab test that is abnormal or we need to change your treatment, we will call you to review the results.   Testing/Procedures: None   Follow-Up: At [facility], you and your health needs are our priority.  As part of our continuing mission to provide you with exceptional heart care, we have created designated Provider Care Teams.  These Care Teams include your primary Cardiologist (physician) and Advanced Practice Providers (APPs -  Physician Assistants and Nurse Practitioners) who all work together to provide you with the care you need, when you need it.  We recommend signing up for the patient portal called "MyChart".  Sign up information is provided on this After Visit Summary.  MyChart is used to connect with patients for Virtual Visits (Telemedicine).  Patients are able to view lab/test results, encounter notes, upcoming appointments, etc.  Non-urgent messages can be sent to your provider as well.   To learn more about what you can do with MyChart, go to [email or web address].    Your next appointment:   6 month(s)  The format for your next appointment:   In Person  Provider:   Scott Weaver, PA-C   Other Instructions   

## 2020-03-10 NOTE — Progress Notes (Signed)
Cardiology Office Note:    Date:  03/10/2020   ID:  Joseph [name] 1941/07/03, MRN [id number]  PCP:  Dema Severin, NP  Cardiologist:  Kristeen Miss, MD  Electrophysiologist:  None   Referring MD: Dema Severin, NP   Chief Complaint  Patient presents with  . Coronary Artery Disease  . Atrial Fibrillation     Feb. 17, 2020    Joseph [name] is a 79 y.o. male with a hx of coronary artery disease.  He is status post anterior wall myocardial infarction in 2004 treated with a stent to the LAD with a staged PCI to the right coronary artery.  Also had an non-ST segment elevation myocardial infarction in January 2016 treated treated with a DES to the mid LAD and a DES to the mid and distal circumflex artery. He also has a history of paroxysmal atrial fibrillation, hypertension, hyperlipidemia.  He is intolerant to statins.  He has a history of diabetes mellitus and a previous stroke.  He is a previous patient of Dr. Okey Dupre .  He was last seen in our office in July, 2019 by Tereso Newcomer, PA.  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  Denies any chest pain. Rides a stationary bike - does not have any issues with that  Follows a low chol diet .   Has had a rash across his body for the past 2 years .     Might be related to the onset of the Eliquis .  He thinks the rash is worse since he started Coreg.   Oct. 5, 2020 Doing well.    No CP,  Works around the yard,  Hip pain limits his exercise.  Just got through building a work shop .   He brought brought blood work from his primary medical doctor.  Vitamin D level is 40.  Sodium is 140.  Potassium is 4.8.  BUN is 34.  Creatinine is 1.24.  Liver enzymes are normal.  Thyroid profiles are normal.  Total cholesterol is 173.  LDL is 106.  Triglycerides are 160.  HDL is 33.  Hemoglobin A1c is 7.6.  He has hyperlipidemia.  He is tried numerous statins and also has tried the PCSK9 inhibitors.  He did not tolerate any of these.  He  also has tried Zetia but did not tolerate it.  We will continue with diet and exercise.  Dec. 4, 2020 :  Joseph [name] is seen today in follow up for a recent hospitalization for unstable angina  Heart catheterization performed on June 11, 2019 reveals diffuse three-vessel coronary artery disease.  He was started on Ranexa 500 mg twice a day.  He was started on Plavix. No bleeding issues.  Developed some neck pain last night  Has occasional random sharp and dull pains in his chest   September 10, 2019: Joseph [name] is seen today for follow up of his 3 V cad.   Had PCI recently   He stopped taking Ranexa   Is taking imdur No CP .   Is fairly active  .   Walks on occasion    Aug. 23, 2021  Joseph [name] is seen today for follow up of his CAD;, PAF, PAD He was seen by Tereso Newcomer, PA in May . Cath in Oct. 2020 revealed diffuse 3 V cad with no targets for revascularization  His imdur was increased to 180 mg a day Still has occasional CP.  Not exertional  Associated with weakness.  Seems to be partially relieved with getting up and moving around  Is in the [employer] study    Past Medical History:  Diagnosis Date  . Abdominal discomfort    Abdominal discomfort: EGD 2011 showed no significant abnormalities.  Possibly due to metformin.  . Allergic rhinitis   . Anticoagulated 05/30/2016  . CAD (coronary artery disease)    a. s/p MI in 2004:  stents to the LAD and staged stent RCA, EF 55%;  b.  NSTEMI (1/16):  LHC - mid LAD stent with diff dist restenosis, prox to mid Dx mod dsz jailed by stent, small OM1 99, mCFX 75, dCFX 80, mRCA stent ok, mPDA mild to mod dsz, EF 55% >> PCI:  Synergy DES to LAD; Synergy DES (x2) mid and dist CFX  . CAD in native artery 06/12/2019  . 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  s/p back surgery  . DM (diabetes mellitus) (HCC)   . History of CVA (cerebrovascular accident)    Small CVA seen by head CT  (incidental) in 1/13.  Carotid dopplers in 1/13 showed minimal disease  . History of Doppler ultrasound    Arterial dopplers (3/11): no evidence for significant PAD. ABIs 10/12: Normal.    . HLD (hyperlipidemia)   . HTN (hypertension)    had side effects with chlorthalidone  . Hx of cardiovascular stress test    Myoview (1/13): Small fixed apical septal defect with no ischemia, EF 58%.  [name]. Hx of echocardiogram    Echo (3/11): EF 60%, normal wall motion, mild MR, mild left atrial enlargement, mildly dilated ascending aorta.  [name]. PAF (paroxysmal atrial fibrillation) (HCC) 05/29/2016  . Spondylolisthesis    L4-S1    Past Surgical History:  Procedure Laterality Date  . COLONOSCOPY N/A 12/26/2017   Procedure: COLONOSCOPY;  Surgeon: Benancio DeedsArmbruster, Steven P, MD;  [facility];  Service: Gastroenterology;  Laterality: N/A;  . COLONOSCOPY W/ POLYPECTOMY  12/2017  . CORONARY ANGIOGRAM  11/13/2014   Procedure: CORONARY ANGIOGRAM;  Surgeon: Laurey Moralealton S McLean, MD;  [facility];  Service: Cardiovascular;;  . CORONARY STENT INTERVENTION N/A 05/18/2019   Procedure: CORONARY STENT INTERVENTION;  Surgeon: Marykay LexHarding, David W, MD;  [facility];  Service: Cardiovascular;  Laterality: N/A;  . CORONARY STENT PLACEMENT  2004   x2 LAD and RCA   . LEFT HEART CATH AND CORONARY ANGIOGRAPHY N/A 05/18/2019   Procedure: LEFT HEART CATH AND CORONARY ANGIOGRAPHY;  Surgeon: Marykay LexHarding, David W, MD;  [facility];  Service: Cardiovascular;  Laterality: N/A;  . LEFT HEART CATH AND CORONARY ANGIOGRAPHY N/A 06/11/2019   Procedure: LEFT HEART CATH AND CORONARY ANGIOGRAPHY;  Surgeon: Yvonne KendallEnd, Christopher, MD;  Location: [facility] INVASIVE CV LAB;  Service: Cardiovascular;  Laterality: N/A;  . LEFT HEART CATHETERIZATION WITH CORONARY ANGIOGRAM N/A 08/02/2014   Procedure: LEFT HEART CATHETERIZATION WITH CORONARY ANGIOGRAM;  Surgeon: Corky CraftsJayadeep S Varanasi, MD;  [facility];  Service:  Cardiovascular;  Laterality: N/A;  . PROSTATE SURGERY    . SPINAL FUSION     L4-S1    Current Medications: Current Meds  Medication Sig  . acetaminophen (TYLENOL) 500 MG tablet Take 1,000 mg by mouth as needed for moderate pain.  [name]. apixaban (ELIQUIS) 5 MG TABS tablet Take 1 tablet (5 mg total) by mouth 2 (two) times daily. DO NOT TAKE FOR 3 MORE DAYS. MAY RESTART TAKING IT ON 01/01/2018.  .[name]  carvedilol (COREG) 3.125 MG tablet Take 3.125 mg by mouth 2 (two) times daily.   . Cholecalciferol (VITAMIN D3) 25 MCG (1000 UT) CAPS Take 1 capsule by mouth daily.  . clopidogrel (PLAVIX) 75 MG tablet Take 1 tablet (75 mg total) by mouth daily.  . Coenzyme Q10 100 MG capsule Take 1 capsule by mouth daily.  [name] diltiazem (CARDIZEM) 30 MG tablet Take 1 tablet (30 mg total) by mouth as needed (Rapid Palpitations).  . fluticasone (FLONASE) 50 MCG/ACT nasal spray Place 2 sprays into both nostrils daily as needed for allergies or rhinitis.   [name] glipiZIDE (GLUCOTROL) 10 MG tablet Take 10 mg by mouth 2 (two) times daily.  . hydroxypropyl methylcellulose / hypromellose (ISOPTO TEARS / GONIOVISC) 2.5 % ophthalmic solution Place 1 drop into both eyes 3 (three) times daily as needed for dry eyes.  . isosorbide mononitrate (IMDUR) 120 MG 24 hr tablet Take 1 tablet (120 mg total) by mouth daily.  . isosorbide mononitrate (IMDUR) 30 MG 24 hr tablet Take 30 mg by mouth as needed.  [name] lisinopril (ZESTRIL) 20 MG tablet Take 0.5 tablets (10 mg total) by mouth daily.  [name] losartan (COZAAR) 50 MG tablet Take 50 mg by mouth daily.  . nitroGLYCERIN (NITROSTAT) 0.4 MG SL tablet Place 1 tablet (0.4 mg total) under the tongue every 5 (five) minutes as needed for chest pain (x 3 doses).  . Omega-3 Fatty Acids (FISH OIL) 1000 MG CAPS Take 4 capsules (4,000 mg total) by mouth daily.  Evaristo Bury FLEXTOUCH 100 UNIT/ML FlexTouch Pen Inject 28 Units into the skin daily.   [name] triamcinolone cream (KENALOG) 0.5 % Apply 1 application topically as  needed for irritation.     Allergies:   Bempedoic acid, [name] [alirocumab], Rosuvastatin, and Zetia [ezetimibe]   Social History   Socioeconomic History  . Marital status: Divorced    Spouse name: Not on file  . Number of children: 2  . Years of education: Not on file  . Highest education level: Not on file  Occupational History  . Occupation: retired  Tobacco Use  . Smoking status: Never Smoker  . Smokeless tobacco: Never Used  Vaping Use  . Vaping Use: Never used  Substance and Sexual Activity  . Alcohol use: No  . Drug use: No  . Sexual activity: Not on file  Other Topics Concern  . Not on file  Social History Narrative  . Not on file   Social Determinants of Health   Financial Resource Strain:   . Difficulty of Paying Living Expenses: Not on file  Food Insecurity:   . Worried About Programme researcher, broadcasting/film/video in the Last Year: Not on file  . Ran Out of Food in the Last Year: Not on file  Transportation Needs:   . Lack of Transportation (Medical): Not on file  . Lack of Transportation (Non-Medical): Not on file  Physical Activity:   . Days of Exercise per Week: Not on file  . Minutes of Exercise per Session: Not on file  Stress:   . Feeling of Stress : Not on file  Social Connections:   . Frequency of Communication with Friends and Family: Not on file  . Frequency of Social Gatherings with Friends and Family: Not on file  . Attends Religious Services: Not on file  . Active Member of Clubs or Organizations: Not on file  . Attends Banker Meetings: Not on file  . Marital Status: Not on file  Family History: The patient's family history includes Heart attack in his father and mother. There is no history of Stroke.  ROS:   Please see the history of present illness.     All other systems reviewed and are negative.  EKGs/Labs/Other Studies Reviewed:    The following studies were reviewed today:   . Recent Labs: 05/17/2019: Magnesium 1.9; TSH  2.172 11/11/2019: Hemoglobin 14.8; Platelets 273 11/12/2019: BUN 27; Creatinine, Ser 1.47; Potassium 4.2; Sodium 136  Recent Lipid Panel    Component Value Date/Time   CHOL 174 05/18/2019 0210   CHOL 114 07/21/2018 0747   TRIG 136 05/18/2019 0210   HDL 32 (L) 05/18/2019 0210   HDL 38 (L) 07/21/2018 0747   CHOLHDL 5.4 05/18/2019 0210   VLDL 27 05/18/2019 0210   LDLCALC 115 (H) 05/18/2019 0210   LDLCALC 50 07/21/2018 0747   LDLDIRECT 83.0 10/28/2014 1622    Physical Exam: Blood pressure 132/70, pulse 68, height 6\' 2"  (1.88 m), weight 217 lb 3.2 oz (98.5 kg), SpO2 97 %.  GEN:  Well nourished, well developed in no acute distress HEENT: Normal NECK: No JVD; No carotid bruits LYMPHATICS: No lymphadenopathy CARDIAC: RRR , no murmurs, rubs, gallops RESPIRATORY:  Clear to auscultation without rales, wheezing or rhonchi  ABDOMEN: Soft, non-tender, non-distended MUSCULOSKELETAL:  No edema; No deformity  SKIN: Warm and dry NEUROLOGIC:  Alert and oriented x 3    ECG:     ASSESSMENT:    No diagnosis found. PLAN:    In order of problems listed above:  1. 1.  Coronary artery disease:  Cath from Nov. 23, 2020 shows moderate  diffuse 3 V .    He has occasional episodes of chest pain but these sound fairly atypical for coronary artery disease.  They typically occur at rest and improve as he gets up and moves around.  He will continue with the same medications.    2.  Paroxysmal atrial fibrillation: Rhythm is normal.  I suspect he remains in sinus rhythm.    3.  Hyperlipidemia:  He is currently enrolled in the [place] for study.  I do not have any recent lipid levels.  He is wondering whether or not he should continue with the [facility] for study.  Encouraged him to call the research nurse and discuss further.  We will have him return in 6 months to see [place], [place].      Medication Adjustments/Labs and Tests Ordered: Current medicines are reviewed at length with the patient  today.  Concerns regarding medicines are outlined above.  No orders of the defined types were placed in this encounter.  No orders of the defined types were placed in this encounter.   There are no Patient Instructions on file for this visit.   Signed, Tereso Newcomer, MD  03/10/2020 8:14 AM    [facility]

## 2020-03-13 ENCOUNTER — Encounter: Payer: Self-pay | Admitting: *Deleted

## 2020-03-13 DIAGNOSIS — Z006 Encounter for examination for normal comparison and control in clinical research program: Secondary | ICD-10-CM

## 2020-03-16 ENCOUNTER — Encounter (HOSPITAL_BASED_OUTPATIENT_CLINIC_OR_DEPARTMENT_OTHER): Payer: Self-pay | Admitting: Emergency Medicine

## 2020-03-16 ENCOUNTER — Other Ambulatory Visit: Payer: Self-pay

## 2020-03-16 ENCOUNTER — Emergency Department (HOSPITAL_BASED_OUTPATIENT_CLINIC_OR_DEPARTMENT_OTHER)
Admission: EM | Admit: 2020-03-16 | Discharge: 2020-03-17 | Disposition: A | Discharge status: Home / Self Care | Payer: Medicare Other | Attending: Emergency Medicine | Admitting: Emergency Medicine

## 2020-03-16 ENCOUNTER — Emergency Department (HOSPITAL_BASED_OUTPATIENT_CLINIC_OR_DEPARTMENT_OTHER): Payer: Medicare Other

## 2020-03-16 DIAGNOSIS — R109 Unspecified abdominal pain: Secondary | ICD-10-CM | POA: Diagnosis not present

## 2020-03-16 DIAGNOSIS — I10 Essential (primary) hypertension: Secondary | ICD-10-CM | POA: Diagnosis not present

## 2020-03-16 DIAGNOSIS — Z7901 Long term (current) use of anticoagulants: Secondary | ICD-10-CM | POA: Diagnosis not present

## 2020-03-16 DIAGNOSIS — I2510 Atherosclerotic heart disease of native coronary artery without angina pectoris: Secondary | ICD-10-CM | POA: Insufficient documentation

## 2020-03-16 DIAGNOSIS — R197 Diarrhea, unspecified: Secondary | ICD-10-CM | POA: Insufficient documentation

## 2020-03-16 DIAGNOSIS — Z20822 Contact with and (suspected) exposure to covid-19: Secondary | ICD-10-CM | POA: Diagnosis not present

## 2020-03-16 DIAGNOSIS — E1159 Type 2 diabetes mellitus with other circulatory complications: Secondary | ICD-10-CM | POA: Diagnosis not present

## 2020-03-16 DIAGNOSIS — Z79899 Other long term (current) drug therapy: Secondary | ICD-10-CM | POA: Insufficient documentation

## 2020-03-16 LAB — CBC
HCT: 44.2 % (ref 39.0–52.0)
Hemoglobin: 15 g/dL (ref 13.0–17.0)
MCH: 31.3 pg (ref 26.0–34.0)
MCHC: 33.9 g/dL (ref 30.0–36.0)
MCV: 92.1 fL (ref 80.0–100.0)
Platelets: 293 10*3/uL (ref 150–400)
RBC: 4.8 MIL/uL (ref 4.22–5.81)
RDW: 11.9 % (ref 11.5–15.5)
WBC: 15 10*3/uL — ABNORMAL HIGH (ref 4.0–10.5)
nRBC: 0 % (ref 0.0–0.2)

## 2020-03-16 LAB — URINALYSIS, ROUTINE W REFLEX MICROSCOPIC
Glucose, UA: 100 mg/dL — AB
Ketones, ur: NEGATIVE mg/dL
Nitrite: NEGATIVE
Protein, ur: 30 mg/dL — AB
Specific Gravity, Urine: >1.03 — ABNORMAL HIGH (ref 1.005–1.030)
pH: 5.5 (ref 5.0–8.0)

## 2020-03-16 LAB — COMPREHENSIVE METABOLIC PANEL
ALT: 40 U/L (ref 0–44)
AST: 44 U/L — ABNORMAL HIGH (ref 15–41)
Albumin: 4.4 g/dL (ref 3.5–5.0)
Alkaline Phosphatase: 56 U/L (ref 38–126)
Anion gap: 10 (ref 5–15)
BUN: 32 mg/dL — ABNORMAL HIGH (ref 8–23)
CO2: 25 mmol/L (ref 22–32)
Calcium: 10.4 mg/dL — ABNORMAL HIGH (ref 8.9–10.3)
Chloride: 98 mmol/L (ref 98–111)
Creatinine, Ser: 1.49 mg/dL — ABNORMAL HIGH (ref 0.61–1.24)
GFR calc Af Amer: 50 mL/min — ABNORMAL LOW (ref >60)
GFR calc non Af Amer: 43 mL/min — ABNORMAL LOW (ref >60)
Glucose, Bld: 222 mg/dL — ABNORMAL HIGH (ref 70–99)
Potassium: 4.8 mmol/L (ref 3.5–5.1)
Sodium: 133 mmol/L — ABNORMAL LOW (ref 135–145)
Total Bilirubin: 0.9 mg/dL (ref 0.3–1.2)
Total Protein: 7.3 g/dL (ref 6.5–8.1)

## 2020-03-16 LAB — LIPASE, BLOOD: Lipase: 19 U/L (ref 11–51)

## 2020-03-16 LAB — URINALYSIS, MICROSCOPIC (REFLEX)

## 2020-03-16 NOTE — ED Triage Notes (Signed)
Reports diffuse abdominal pain since yesterday.  Seen at [facility].  Placed on cipro, flagyl, bentyl and zofran.  Reports pain is no better.  Says it will ease off but then comes back.  Now having loose stools x 2 today.  Also endorses n/v.

## 2020-03-17 DIAGNOSIS — R109 Unspecified abdominal pain: Secondary | ICD-10-CM | POA: Diagnosis not present

## 2020-03-17 LAB — SARS CORONAVIRUS 2 BY RT PCR (HOSPITAL ORDER, PERFORMED IN ~~LOC~~ HOSPITAL LAB): SARS Coronavirus 2: NEGATIVE

## 2020-03-17 MED ORDER — ONDANSETRON HCL 4 MG/2ML IJ SOLN
4.0000 mg | Freq: Once | INTRAMUSCULAR | Status: AC
  Administered 2020-03-17: 4 mg via INTRAVENOUS
  Filled 2020-03-17: qty 2

## 2020-03-17 MED ORDER — SODIUM CHLORIDE 0.9 % IV BOLUS
500.0000 mL | Freq: Once | INTRAVENOUS | Status: AC
  Administered 2020-03-17: 500 mL via INTRAVENOUS

## 2020-03-17 NOTE — Discharge Instructions (Addendum)
You were seen today for abdominal pain, nausea, vomiting, and diarrhea.  Your work-up is reassuring.  Continue medications at home as directed.  You likely have a viral infection.  Your COVID-19 testing was negative.

## 2020-03-17 NOTE — ED Notes (Signed)
Pt unable to get in touch with family, waiting for ride at this time.

## 2020-03-17 NOTE — ED Provider Notes (Signed)
[facility] EMERGENCY DEPARTMENT Provider Note   CSN: [id number] Arrival date & time: 03/16/20  1900     History Chief Complaint  Patient presents with  . Abdominal Pain    Joseph [name] is a 79 y.o. male.  HPI     This is an 79 year old male with a history of coronary artery disease, diabetes, hypertension, hyperlipidemia who presents with abdominal pain.  Patient reports onset of abdominal pain yesterday.  He reports that it is diffuse and crampy in nature.  Currently his pain is 2 out of 10.  He has had significant relief of pain after he started having bowel movements.  He reports that he has had 5 episodes of diarrhea since presentation to the emergency room.  He has had one episode of nonbilious, nonbloody emesis since onset of symptoms as well.  Was seen in urgent care and given Zofran, Cipro, and Flagyl for which he has been taking.  He denies any infectious symptoms or fevers.  No known sick contacts.  He is fully vaccinated against COVID-19.  Past Medical History:  Diagnosis Date  . Abdominal discomfort    Abdominal discomfort: EGD 2011 showed no significant abnormalities.  Possibly due to metformin.  . Allergic rhinitis   . Anticoagulated 05/30/2016  . CAD (coronary artery disease)    a. s/p MI in 2004:  stents to the LAD and staged stent RCA, EF 55%;  b.  NSTEMI (1/16):  LHC - mid LAD stent with diff dist restenosis, prox to mid Dx mod dsz jailed by stent, small OM1 99, mCFX 75, dCFX 80, mRCA stent ok, mPDA mild to mod dsz, EF 55% >> PCI:  Synergy DES to LAD; Synergy DES (x2) mid and dist CFX  . CAD in native artery 06/12/2019  . 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  s/p back surgery  . DM (diabetes mellitus) (HCC)   . History of CVA (cerebrovascular accident)    Small CVA seen by head CT (incidental) in 1/13.  Carotid dopplers in 1/13 showed minimal disease  . History of Doppler ultrasound      Arterial dopplers (3/11): no evidence for significant PAD. ABIs 10/12: Normal.    . HLD (hyperlipidemia)   . HTN (hypertension)    had side effects with chlorthalidone  . Hx of cardiovascular stress test    Myoview (1/13): Small fixed apical septal defect with no ischemia, EF 58%.  [name] Hx of echocardiogram    Echo (3/11): EF 60%, normal wall motion, mild MR, mild left atrial enlargement, mildly dilated ascending aorta.  [name] PAF (paroxysmal atrial fibrillation) (HCC) 05/29/2016  . Spondylolisthesis    L4-S1    Patient Active Problem List   Diagnosis Date Noted  . CAD in native artery 06/12/2019  . Chest pain 06/07/2019  . Unstable angina (HCC) 05/17/2019  . Lower GI bleed 12/26/2017  . Hypotension 12/26/2017  . Postural dizziness with presyncope 12/26/2017  . Fatigue 09/22/2017  . 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 Hyperlipidemia 04/11/2017  . Anticoagulated 05/30/2016  . Paroxysmal atrial fibrillation (HCC) 05/29/2016  . Exertional angina (HCC) 08/02/2014  . Bradycardia 02/02/2012  . CVA (cerebral infarction) 08/01/2011  . Leg pain 03/31/2011  . Abdominal pain 10/13/2010  . Hip pain 10/13/2010  . Pure hypercholesterolemia 09/09/2009  . Essential hypertension 09/09/2009  . Peripheral vascular disease (HCC) 09/09/2009  . Type  2 diabetes mellitus with vascular disease (HCC) 09/08/2009  . CAD S/P percutaneous coronary angioplasty 09/08/2009    Past Surgical History:  Procedure Laterality Date  . COLONOSCOPY N/A 12/26/2017   Procedure: COLONOSCOPY;  Surgeon: Benancio Deeds, MD;  Location: [facility] ENDOSCOPY;  Service: Gastroenterology;  Laterality: N/A;  . COLONOSCOPY W/ POLYPECTOMY  12/2017  . CORONARY ANGIOGRAM  11/13/2014   Procedure: CORONARY ANGIOGRAM;  Surgeon: Laurey Morale, MD;  Location: [facility] CATH LAB;  Service: Cardiovascular;;  . CORONARY STENT INTERVENTION N/A 05/18/2019   Procedure: CORONARY  STENT INTERVENTION;  Surgeon: Marykay Lex, MD;  Location: [facility] INVASIVE CV LAB;  Service: Cardiovascular;  Laterality: N/A;  . CORONARY STENT PLACEMENT  2004   x2 LAD and RCA   . LEFT HEART CATH AND CORONARY ANGIOGRAPHY N/A 05/18/2019   Procedure: LEFT HEART CATH AND CORONARY ANGIOGRAPHY;  Surgeon: Marykay Lex, MD;  Location: [facility] INVASIVE CV LAB;  Service: Cardiovascular;  Laterality: N/A;  . LEFT HEART CATH AND CORONARY ANGIOGRAPHY N/A 06/11/2019   Procedure: LEFT HEART CATH AND CORONARY ANGIOGRAPHY;  Surgeon: Yvonne Kendall, MD;  Location: [facility] INVASIVE CV LAB;  Service: Cardiovascular;  Laterality: N/A;  . LEFT HEART CATHETERIZATION WITH CORONARY ANGIOGRAM N/A 08/02/2014   Procedure: LEFT HEART CATHETERIZATION WITH CORONARY ANGIOGRAM;  Surgeon: Corky Crafts, MD;  Location: [facility] CATH LAB;  Service: Cardiovascular;  Laterality: N/A;  . PROSTATE SURGERY    . SPINAL FUSION     L4-S1       Family History  Problem Relation Age of Onset  . Heart attack Mother   . Heart attack Father   . Stroke Neg Hx     Social History   Tobacco Use  . Smoking status: Never Smoker  . Smokeless tobacco: Never Used  Vaping Use  . Vaping Use: Never used  Substance Use Topics  . Alcohol use: No  . Drug use: No    Home Medications Prior to Admission medications   Medication Sig Start Date End Date Taking? Authorizing Provider  acetaminophen (TYLENOL) 500 MG tablet Take 1,000 mg by mouth as needed for moderate pain.    [provider]  apixaban (ELIQUIS) 5 MG TABS tablet Take 1 tablet (5 mg total) by mouth 2 (two) times daily. DO NOT TAKE FOR 3 MORE DAYS. MAY RESTART TAKING IT ON 01/01/2018. 12/28/17   Elease Etienne, MD  carvedilol (COREG) 3.125 MG tablet Take 3.125 mg by mouth 2 (two) times daily.  08/09/18   [provider]  Cholecalciferol (VITAMIN D3) 25 MCG (1000 UT) CAPS Take 1 capsule by mouth daily.    [provider]  clopidogrel (PLAVIX) 75 MG tablet  Take 1 tablet (75 mg total) by mouth daily. 05/20/19   Leone Brand, NP  Coenzyme Q10 100 MG capsule Take 1 capsule by mouth daily.    [provider]  diltiazem (CARDIZEM) 30 MG tablet Take 1 tablet (30 mg total) by mouth as needed (Rapid Palpitations). 01/31/18   Tereso Newcomer T, PA-C  fluticasone (FLONASE) 50 MCG/ACT nasal spray Place 2 sprays into both nostrils daily as needed for allergies or rhinitis.  03/31/15   [provider]  glipiZIDE (GLUCOTROL) 10 MG tablet Take 10 mg by mouth 2 (two) times daily. 01/08/20   [provider]  hydroxypropyl methylcellulose / hypromellose (ISOPTO TEARS / GONIOVISC) 2.5 % ophthalmic solution Place 1 drop into both eyes 3 (three) times daily as needed for dry eyes.    [provider]  isosorbide mononitrate (IMDUR) 120 MG 24 hr tablet Take 1 tablet (120 mg total) by mouth daily. 12/05/19   Tereso Newcomer T, PA-C  isosorbide mononitrate (IMDUR) 30 MG 24 hr tablet Take 30 mg by mouth as needed.    [provider]  lisinopril (ZESTRIL) 20 MG tablet Take 0.5 tablets (10 mg total) by mouth daily. 12/05/19   Tereso Newcomer T, PA-C  losartan (COZAAR) 50 MG tablet Take 50 mg by mouth daily. 02/24/20   [provider]  nitroGLYCERIN (NITROSTAT) 0.4 MG SL tablet Place 1 tablet (0.4 mg total) under the tongue every 5 (five) minutes as needed for chest pain (x 3 doses). 06/12/19   Nahser, Deloris Ping, MD  Omega-3 Fatty Acids (FISH OIL) 1000 MG CAPS Take 4 capsules (4,000 mg total) by mouth daily. 06/03/16   Bhagat, Sharrell Ku, PA  Study - [name] - inclisiran 300 mg/1.58mL or placebo SQ injection (PI-Stuckey) Inject 300 mg into the skin every 6 (six) months. 05/03/19   [provider]  TRESIBA FLEXTOUCH 100 UNIT/ML FlexTouch Pen Inject 28 Units into the skin daily.  09/10/19   [provider]  triamcinolone cream (KENALOG) 0.5 % Apply 1 application topically as needed for irritation. 08/24/17   [provider]    Allergies    Bempedoic acid, [name] [alirocumab], Rosuvastatin, and Zetia [ezetimibe]  Review of Systems   Review of Systems  Constitutional: Negative for fever.  Respiratory: Negative for chest tightness and shortness of breath.   Cardiovascular: Negative for chest pain.  Gastrointestinal: Positive for abdominal pain, diarrhea, nausea and vomiting. Negative for blood in stool.  Genitourinary: Negative for dysuria.  All other systems reviewed and are negative.   Physical Exam Updated Vital Signs BP 118/64 (BP Location: Right Arm)   Pulse 61   Temp 98.6 F (37 C)   Resp 16   Ht 1.88 m (6\' 2" )   Wt 95.3 kg   SpO2 94%   BMI 26.96 kg/m   Physical Exam Vitals and nursing note reviewed.  Constitutional:      Appearance: He is well-developed. He is not ill-appearing.  HENT:     Head: Normocephalic and atraumatic.  Eyes:     Pupils: Pupils are equal, round, and reactive to light.  Cardiovascular:     Rate and Rhythm: Normal rate and regular rhythm.     Heart sounds: Normal heart sounds. No murmur heard.   Pulmonary:     Effort: Pulmonary effort is normal. No respiratory distress.     Breath sounds: Normal breath sounds. No wheezing.  Abdominal:     General: Bowel sounds are increased.     Palpations: Abdomen is soft.     Tenderness: There is no abdominal tenderness. There is no guarding or rebound.     Hernia: No hernia is present.     Comments: Lower abdominal scarring noted  Musculoskeletal:     Cervical back: Neck supple.  Lymphadenopathy:     Cervical: No cervical adenopathy.  Skin:    General: Skin is warm and dry.  Neurological:     Mental Status: He is alert and oriented to person, place, and time.  Psychiatric:        Mood and Affect: Mood normal.     ED Results / Procedures / Treatments   Labs (all labs ordered are listed, but only abnormal results are displayed) Labs Reviewed  COMPREHENSIVE METABOLIC PANEL - Abnormal; Notable  for the following components:  Result Value   Sodium 133 (*)    Glucose, Bld 222 (*)    BUN 32 (*)    Creatinine, Ser 1.49 (*)    Calcium 10.4 (*)    AST 44 (*)    GFR calc non Af Amer 43 (*)    GFR calc Af Amer 50 (*)    All other components within normal limits  CBC - Abnormal; Notable for the following components:   WBC 15.0 (*)    All other components within normal limits  URINALYSIS, ROUTINE W REFLEX MICROSCOPIC - Abnormal; Notable for the following components:   Specific Gravity, Urine >1.030 (*)    Glucose, UA 100 (*)    Hgb urine dipstick LARGE (*)    Bilirubin Urine SMALL (*)    Protein, ur 30 (*)    Leukocytes,Ua TRACE (*)    All other components within normal limits  URINALYSIS, MICROSCOPIC (REFLEX) - Abnormal; Notable for the following components:   Bacteria, UA RARE (*)    All other components within normal limits  SARS CORONAVIRUS 2 BY RT PCR (HOSPITAL ORDER, PERFORMED IN [facility])  LIPASE, BLOOD    EKG None  Radiology CT Renal Stone Study  Result Date: 03/16/2020 CLINICAL DATA:  Flank pain EXAM: CT ABDOMEN AND PELVIS WITHOUT CONTRAST TECHNIQUE: Multidetector CT imaging of the abdomen and pelvis was performed following the standard protocol without IV contrast. COMPARISON:  None. FINDINGS: Lower chest: The visualized heart size within normal limits. No pericardial fluid/thickening. No hiatal hernia. The visualized portions of the lungs are clear. Hepatobiliary: Although limited due to the lack of intravenous contrast, normal in appearance without gross focal abnormality. Layering calcified stones or sludge is seen. Pancreas:  Unremarkable.  No surrounding inflammatory changes. Spleen: Normal in size. Although limited due to the lack of intravenous contrast, normal in appearance. Adrenals/Urinary Tract: Both adrenal glands appear normal. Again noted is mild bilateral renal atrophy. There is calcifications seen at the lower pole of the right kidney  measuring 6 mm. There is also scattered left lower pole renal calcifications the largest measuring 4 mm. There are probable bilateral renal vascular calcifications. No hydronephrosis. The bladder is partially decompressed. Stomach/Bowel: The stomach, small bowel, and colon are normal in appearance. No inflammatory changes or obstructive findings. Scattered colonic diverticula are noted without diverticulitis. Vascular/Lymphatic: There are no enlarged abdominal or pelvic lymph nodes. Scattered aortic atherosclerotic calcifications are seen without aneurysmal dilatation. Reproductive: The patient is status post prostatectomy with surgical clips. Other: Small bilateral inguinal hernias are seen containing fat. Musculoskeletal: No acute or significant osseous findings. The patient has had prior posterior fixation from L4 through S1. Interbody fusions are seen at L4-L5 and L5-S1. IMPRESSION: Bilateral nonobstructing renal calculi. Partially decompressed bladder. Cholelithiasis Aortic Atherosclerosis (ICD10-I70.0). Electronically Signed   By: Jonna Clark M.D.   On: 03/16/2020 22:36    Procedures Procedures (including critical care time)  Medications Ordered in ED Medications  ondansetron (ZOFRAN) injection 4 mg (4 mg Intravenous Given 03/17/20 0024)  sodium chloride 0.9 % bolus 500 mL (0 mLs Intravenous Stopped 03/17/20 0128)    ED Course  I have reviewed the triage vital signs and the nursing notes.  Pertinent labs & imaging results that were available during my care of the patient were reviewed by me and considered in my medical decision making (see chart for details).    MDM Rules/Calculators/A&P  Patient presents with abdominal pain, nausea, vomiting, and diarrhea.  He is overall nontoxic.  Vital signs are reassuring.  He is afebrile.  Low risk Covid as he is vaccinated and no known exposures.  His abdominal exam is fairly benign with the exception of some increased bowel  sounds.  Considerations include but not limited to enteritis, gastroenteritis, SBO.  Less likely pancreatitis or appendicitis.  Lab work reviewed from triage.  He has a leukocytosis to 15.  No evidence of a UTI.  Stable CMP.  CT stone study from triage reviewed.  No obvious intra-abdominal pathology to explain patient's symptoms.  Patient was improved upon my evaluation after he had multiple stools.  He could have a viral illness or gastroenteritis.  Given prevalence of COVID-19, will test for Covid although he is low risk.  This testing is negative.  Patient is able to tolerate fluids without difficulty and remains comfortable with a reassuring abdominal exam.  After history, exam, and medical workup I feel the patient has been appropriately medically screened and is safe for discharge home. Pertinent diagnoses were discussed with the patient. Patient was given return precautions.  Joseph [name] was evaluated in Emergency Department on 03/17/2020 for the symptoms described in the history of present illness. He was evaluated in the context of the global COVID-19 pandemic, which necessitated consideration that the patient might be at risk for infection with the SARS-CoV-2 virus that causes COVID-19. Institutional protocols and algorithms that pertain to the evaluation of patients at risk for COVID-19 are in a state of rapid change based on information released by regulatory bodies including the CDC and federal and state organizations. These policies and algorithms were followed during the patient's care in the ED.  Final Clinical Impression(s) / ED Diagnoses Final diagnoses:  Abdominal pain, vomiting, and diarrhea    Rx / DC Orders ED Discharge Orders    None       Chaynce Schafer, Mayer Maskerourtney F, MD 03/17/20 0210

## 2020-03-17 NOTE — ED Notes (Signed)
Attempted to call patient's daughter x2, no answer

## 2020-04-01 ENCOUNTER — Encounter: Payer: Medicare Other | Admitting: *Deleted

## 2020-04-01 ENCOUNTER — Other Ambulatory Visit: Payer: Self-pay

## 2020-04-01 DIAGNOSIS — Z006 Encounter for examination for normal comparison and control in clinical research program: Secondary | ICD-10-CM

## 2020-04-01 NOTE — Research (Signed)
Pt to Research clinic for Dunbar 4 randomization. No AE/SAE to report. Pt received the randomization injection in right ABD. Pt tolerated well. Next visit scheduled.

## 2020-05-22 ENCOUNTER — Other Ambulatory Visit: Payer: Self-pay | Admitting: Cardiology

## 2020-05-27 ENCOUNTER — Encounter: Payer: Self-pay | Admitting: Physician Assistant

## 2020-05-27 ENCOUNTER — Other Ambulatory Visit: Payer: Self-pay

## 2020-05-27 ENCOUNTER — Telehealth (INDEPENDENT_AMBULATORY_CARE_PROVIDER_SITE_OTHER): Payer: Medicare Other | Admitting: Physician Assistant

## 2020-05-27 VITALS — BP 139/68 | HR 61 | Ht 74.0 in | Wt 217.0 lb

## 2020-05-27 DIAGNOSIS — I2510 Atherosclerotic heart disease of native coronary artery without angina pectoris: Secondary | ICD-10-CM

## 2020-05-27 DIAGNOSIS — I10 Essential (primary) hypertension: Secondary | ICD-10-CM

## 2020-05-27 DIAGNOSIS — E7800 Pure hypercholesterolemia, unspecified: Secondary | ICD-10-CM

## 2020-05-27 DIAGNOSIS — Z9861 Coronary angioplasty status: Secondary | ICD-10-CM

## 2020-05-27 NOTE — Progress Notes (Signed)
   Virtual Visit via Telephone Note   This visit type was conducted due to national recommendations for restrictions regarding the COVID-19 Pandemic (e.g. social distancing) in an effort to limit this patient's exposure and mitigate transmission in our community.  Due to his co-morbid illnesses, this patient is at least at moderate risk for complications without adequate follow up.  This format is felt to be most appropriate for this patient at this time.  The patient did not have access to video technology/had technical difficulties with video requiring transitioning to audio format only (telephone).  All issues noted in this document were discussed and addressed.  No physical exam could be performed with this format.  Please refer to the patient's chart for his  consent to telehealth for [facility].    Date:  05/27/2020   ID:  Joseph [name], DOB 1941/02/15, MRN [id number] The patient was identified using 2 identifiers.  Patient Location: Home Provider Location: Home Office  PCP:  Dema Severin, NP  Cardiologist:  Kristeen Miss, MD  Electrophysiologist:  None   Evaluation Performed:  Follow-Up Visit  Chief Complaint:  follow-up  History of Present Illness:    Joseph [name] is a 79 y.o. male with history of CAD, paroxysmal atrial fibrillation, hypertension, hyperlipidemia, diabetes mellitus, stroke and hyperlipidemia seen for follow-up.  History of anterior wall MI in 2004 s/p stent to LAD with staged PCI to the RCA.  Non-STEMI January 2016 s/p DES to mid LAD and DES to mid/distal circumflex.  Last cardiac catheterization October 2020 with diffuse three-vessel disease.  No target for revascularization.  Patient was last seen by Dr. Elease Hashimoto March 10, 2020.  Occasional chest pain which fairly sound like atypical.  His symptoms occurred at rest and improves with moving around.  Enrolled in [place] study for HLD.     Placed on my schedule for virtual follow-up.  He was recently seen  by Dr. Elease Hashimoto March 10, 2020 and was doing well.  6 months follow up recommended.  Patient got upset that he is seeing me earlier than recommended and not Tereso Newcomer, PA-C.  He spoke with me briefly then hung up saying that he would like to speak with Tereso Newcomer. Called again but he was update not talking with Scott. I tried to explained that appointment made by scheduler but he would no listen.  On brief conversation, seems he is doing well without cardiac symptoms.  Will make 3 to 4 months follow-up with Tereso Newcomer.

## 2020-05-27 NOTE — Patient Instructions (Addendum)
Medication Instructions:  Your physician recommends that you continue on your current medications as directed. Please refer to the Current Medication list given to you today.  *If you need a refill on your cardiac medications before your next appointment, please call your pharmacy*   Lab Work: None If you have labs (blood work) drawn today and your tests are completely normal, you will receive your results only by: [name] MyChart Message (if you have MyChart) OR . A paper copy in the mail If you have any lab test that is abnormal or we need to change your treatment, we will call you to review the results.  Follow-Up: At [facility], you and your health needs are our priority.  As part of our continuing mission to provide you with exceptional heart care, we have created designated Provider Care Teams.  These Care Teams include your primary Cardiologist (physician) and Advanced Practice Providers (APPs -  Physician Assistants and Nurse Practitioners) who all work together to provide you with the care you need, when you need it.  We recommend signing up for the patient portal called "MyChart".  Sign up information is provided on this After Visit Summary.  MyChart is used to connect with patients for Virtual Visits (Telemedicine).  Patients are able to view lab/test results, encounter notes, upcoming appointments, etc.  Non-urgent messages can be sent to your provider as well.   To learn more about what you can do with MyChart, go to [email or web address].    Your next appointment:   3-4 month(s)  The format for your next appointment:   In Person  Provider:   Tereso Newcomer, PA-C

## 2020-05-30 DIAGNOSIS — R070 Pain in throat: Secondary | ICD-10-CM | POA: Insufficient documentation

## 2020-07-01 ENCOUNTER — Other Ambulatory Visit: Payer: Self-pay

## 2020-07-01 ENCOUNTER — Encounter: Payer: Medicare Other | Admitting: *Deleted

## 2020-07-01 DIAGNOSIS — Z006 Encounter for examination for normal comparison and control in clinical research program: Secondary | ICD-10-CM

## 2020-07-01 NOTE — Research (Signed)
Subject came into research lab today for [name] Visit 3 Follow-up Appointment. We reviewed and updated any applicable changes in their concomitant medications. There are no new AE's or SAE's to report to sponsor at this time. Subject received IP subcutaneous injection into the Right Abdomen and tolerated well. Next appointment scheduled for Tuesday, June 14th, 2022 @ 0800.

## 2020-07-08 DIAGNOSIS — U071 COVID-19: Secondary | ICD-10-CM | POA: Insufficient documentation

## 2020-08-14 ENCOUNTER — Other Ambulatory Visit: Payer: Self-pay

## 2020-08-14 MED ORDER — ISOSORBIDE MONONITRATE ER 30 MG PO TB24
30.0000 mg | ORAL_TABLET | ORAL | 6 refills | Status: DC | PRN
Start: 2020-08-14 — End: 2020-08-27

## 2020-08-14 MED ORDER — ISOSORBIDE MONONITRATE ER 120 MG PO TB24
120.0000 mg | ORAL_TABLET | Freq: Every day | ORAL | 3 refills | Status: DC
Start: 2020-08-14 — End: 2020-08-27

## 2020-08-26 NOTE — Progress Notes (Addendum)
Cardiology Office Note:    Date:  08/27/2020   ID:  Joseph, [name] [birth year], MRN [id number]  PCP:  Dema Severin, NP  [facility] Cardiologist:  Kristeen Miss, MD   [facility] Electrophysiologist:  None   Referring MD: Dema Severin, NP   Chief Complaint:  Follow-up (CAD, AFib)    Patient Profile:    Joseph [name] is a 80 y.o. male with:   Coronary artery disease  ? S/p anterior STEMI in 2004 >> tx with stent to LAD ? Staged PCI with Taxus DES to RCA ? NSTEMI in 1/16 >> tx with DES to mLAD and DES to mid and dist LCx ? [place] 04/2019: s/p DES x 2 to dist RCA/PDA ? Cath 05/2019: LAD, LCx, RCA stents patent; jailed D2 70-75, RI 100, OM2 90 >> med Rx  ? [place] 4/21 >> Med Rx continued ? Pt intol of Ranolazine (chest and epigastric burning)  Paroxysmal atrial fibrillation ? CHA2DS2-VASc=7 (age x 2, Diab, CAD, HTN, CVA) >> Apixaban   Hypertension   Hyperlipidemia  ? Statin intolerant   Diabetes mellitus   Prior CVA   Hx of LGI bleed after polypectomy in 6/19  Prior CV studies: ABIs 12/19/19 Non-compressible Waveforms at ankle suggest normal study  Echocardiogram 06/08/2019 EF 55-60, mod LVH, ant/sept HK, inf HK, normal RVSF, mild dilation of asc Ao (41 mm)  Cardiac catheterization 06/11/2019 LAD prox 40, mid stent 40 ISR; D2 (jailed) 70 prox and 75 mid; D3 70 RI 100 (CTO) LCx stents patent; OM2 90 (not amenable to PCI) RCA prox stent patent w/ 5 ISR, dist stent patent; RPDA stent patent  Cardiac catheterization 05/18/2019 Distal RCA 90, RPDA 70 PCI:  DES x 2 to distal RCA/PDA  Echocardiogram 05/18/2019 EF 55-60, mod LVH, HK c/w LAD territory ischemia, Gr 1 DD, normal RVSF  Myoview 11/22/2018 EF 60, small apical septal infarct, no ischemia, low risk   Nuclear stress test4/29/19 Low risk stress nuclear study with a fixed anteroseptal perfusion defect and borderline reduction in left ventricular systolic function.EF 54  Echo 09/30/17 Mild  concentric LVH, EF 55-60, normal wall motion, grade 2 diastolic dysfunction, mildly dilated ascending aorta (38 mm), mild MR, mild LAE, PASP 26  Event monitor 06/09/16 NSR. No atrial fibrillation noted.  Nuclear stress test 04/08/16 Large, fixed anteroseptal perfusion defect with associated akinesis suggestive of scar. No significant reversible ischemia. LVEF 55%. This is an intermediate risk study based on infarct size (20%).  Cardiac Catheterization4/27/16 LM distal 30-40 LAD mid stent patent; D1 proximal 80-90 (med rx recommended) LCx stents in the mid AV LCx patent; OM1 proximal 100; OM3 proximal 90 (small) RCA irregularities; PLV serial 50 (small); PDA mid 40-50, distal 30  Echo 10/31/14 Moderate concentric LVH, EF 60-65, normal wall motion, grade 1 diastolic dysfunction, ascending aorta 41 mm, moderate TR  History of Present Illness:    [name] was last seen by Dr. Elease Hashimoto in 8/21.  He returns for f/u.  He is here alone.  He is doing well without anginal symptoms.  He has chronic shortness of breath without change.  He has not had syncope, leg edema.  He sleeps in a recliner b/c of his back.       Past Medical History:  Diagnosis Date  . Abdominal discomfort    Abdominal discomfort: EGD 2011 showed no significant abnormalities.  Possibly due to metformin.  . Allergic rhinitis   . Anticoagulated 05/30/2016  . CAD (coronary artery disease)  a. s/p MI in 2004:  stents to the LAD and staged stent RCA, EF 55%;  b.  NSTEMI (1/16):  LHC - mid LAD stent with diff dist restenosis, prox to mid Dx mod dsz jailed by stent, small OM1 99, mCFX 75, dCFX 80, mRCA stent ok, mPDA mild to mod dsz, EF 55% >> PCI:  Synergy DES to LAD; Synergy DES (x2) mid and dist CFX  . CAD in native artery 06/12/2019  . 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  s/p back surgery  . DM (diabetes mellitus) (HCC)   . History of CVA (cerebrovascular  accident)    Small CVA seen by head CT (incidental) in 1/13.  Carotid dopplers in 1/13 showed minimal disease  . History of Doppler ultrasound    Arterial dopplers (3/11): no evidence for significant PAD. ABIs 10/12: Normal.    . HLD (hyperlipidemia)   . HTN (hypertension)    had side effects with chlorthalidone  . Hx of cardiovascular stress test    Myoview (1/13): Small fixed apical septal defect with no ischemia, EF 58%.  [name] Hx of echocardiogram    Echo (3/11): EF 60%, normal wall motion, mild MR, mild left atrial enlargement, mildly dilated ascending aorta.  [name] PAF (paroxysmal atrial fibrillation) (HCC) 05/29/2016  . 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 acetaminophen (TYLENOL) 500 MG tablet Take 1,000 mg by mouth as needed for moderate pain.  [name] amLODipine (NORVASC) 10 MG tablet Take 10 mg by mouth daily.  [name] apixaban (ELIQUIS) 5 MG TABS tablet Take 1 tablet (5 mg total) by mouth 2 (two) times daily. DO NOT TAKE FOR 3 MORE DAYS. MAY RESTART TAKING IT ON 01/01/2018.  . carvedilol (COREG) 3.125 MG tablet Take 3.125 mg by mouth 2 (two) times daily.   . Cholecalciferol (VITAMIN D3) 25 MCG (1000 UT) CAPS Take 1 capsule by mouth daily.  . Coenzyme Q10 100 MG capsule Take 1 capsule by mouth daily.  [name] diltiazem (CARDIZEM) 30 MG tablet Take 1 tablet (30 mg total) by mouth as needed (Rapid Palpitations).  . fish oil-omega-3 fatty acids 1000 MG capsule Take 2 g by mouth in the morning and at bedtime.  . fluticasone (FLONASE) 50 MCG/ACT nasal spray Place 2 sprays into both nostrils daily as needed for allergies or rhinitis.   [name] glipiZIDE (GLUCOTROL) 10 MG tablet Take 10 mg by mouth 2 (two) times daily.  . hydroxypropyl methylcellulose / hypromellose (ISOPTO TEARS / GONIOVISC) 2.5 % ophthalmic solution Place 1 drop into both eyes 3 (three) times daily as needed for dry eyes.  . isosorbide mononitrate (IMDUR) 120 MG 24 hr tablet Take 120 mg by mouth every  morning.  . isosorbide mononitrate (IMDUR) 30 MG 24 hr tablet Take 30 mg by mouth every evening.  . nitroGLYCERIN (NITROSTAT) 0.4 MG SL tablet Place 1 tablet (0.4 mg total) under the tongue every 5 (five) minutes as needed for chest pain (x 3 doses).  . olmesartan (BENICAR) 20 MG tablet Take 20 mg by mouth daily.  . Study - [name] - inclisiran 300 mg/1.29mL or placebo SQ injection (PI-Stuckey) Inject 300 mg into the skin every 6 (six) months.  Evaristo Bury FLEXTOUCH 100 UNIT/ML FlexTouch Pen Inject 28 Units into the skin daily.   [name] triamcinolone cream (KENALOG) 0.5 % Apply 1 application topically as needed for irritation.  . [DISCONTINUED] clopidogrel (PLAVIX)  75 MG tablet Take 1 tablet by mouth once daily     Allergies:   Bempedoic acid, [name] [alirocumab], Rosuvastatin, and Zetia [ezetimibe]   Social History   Tobacco Use  . Smoking status: Never Smoker  . Smokeless tobacco: Never Used  Vaping Use  . Vaping Use: Never used  Substance Use Topics  . Alcohol use: No  . Drug use: No     Family Hx: The patient's family history includes Heart attack in his father and mother. There is no history of Stroke.  Review of Systems  Gastrointestinal: Negative for hematochezia and melena.     EKGs/Labs/Other Test Reviewed:    EKG:  EKG is not ordered today.  The ekg ordered today demonstrates n/a  Recent Labs: 03/16/2020: ALT 40; BUN 32; Creatinine, Ser 1.49; Hemoglobin 15.0; Platelets 293; Potassium 4.8; Sodium 133   Recent Lipid Panel Lab Results  Component Value Date/Time   CHOL 174 05/18/2019 02:10 AM   CHOL 114 07/21/2018 07:47 AM   TRIG 136 05/18/2019 02:10 AM   HDL 32 (L) 05/18/2019 02:10 AM   HDL 38 (L) 07/21/2018 07:47 AM   CHOLHDL 5.4 05/18/2019 02:10 AM   LDLCALC 115 (H) 05/18/2019 02:10 AM   LDLCALC 50 07/21/2018 07:47 AM   LDLDIRECT 83.0 10/28/2014 04:22 PM      Risk Assessment/Calculations:    CHA2DS2-VASc Score = 7  This indicates a 11.2% annual risk of  stroke. The patient's score is based upon: CHF History: No HTN History: Yes Diabetes History: Yes Stroke History: Yes Vascular Disease History: Yes Age Score: 2 Gender Score: 0     Physical Exam:    VS:  BP 138/60   Pulse 78   Ht 6\' 2"  (1.88 m)   Wt 222 lb (100.7 kg)   SpO2 97%   BMI 28.50 kg/m     Wt Readings from Last 3 Encounters:  08/27/20 222 lb (100.7 kg)  05/27/20 217 lb (98.4 kg)  03/16/20 210 lb (95.3 kg)     Constitutional:      Appearance: Healthy appearance. Not in distress.  Neck:     Vascular: No JVR.  Pulmonary:     Effort: Pulmonary effort is normal.     Breath sounds: No wheezing. No rales.  Cardiovascular:     Normal rate. Regular rhythm. Normal S1. Normal S2.     Murmurs: There is no murmur.  Edema:    Peripheral edema absent.  Abdominal:     Palpations: Abdomen is soft.  Skin:    General: Skin is warm and dry.  Neurological:     General: No focal deficit present.     Mental Status: Alert and oriented to person, place and time.     Cranial Nerves: Cranial nerves are intact.       ASSESSMENT & PLAN:    1. Coronary artery disease involving native coronary artery of native heart with angina pectoris (HCC) His of prior MI and multiple PCI procedures.  His last cardiac catheterization in November 2020 demonstrated patent stents in the LAD, LCx and RCA.  His second diagonal was jailed with 70-75% stenosis.  The ramus intermediate was occluded and there was 90% stenosis in the OM2.  There were no targets for PCI and medical therapy was recommended. He is doing well on his current medical regimen without angina.  Continue amlodipine, carvedilol, clopidogrel, isosorbide.  Follow-up 6 months.  2. Paroxysmal atrial fibrillation (HCC) By exam, maintaining sinus rhythm.  He is tolerating anticoagulation.  Creatinine in 8/21 was 1.49.  He had labs recently with primary care.  We will request those records.  If his creatinine >1.5, we will need to monitor  closely and decide if/when to adjust his dose of Apixaban to 2.5 mg twice daily.  For now, continue current dose of Apixaban.   3. Essential hypertension Fair control.  Continue current therapy.  4. Mixed hyperlipidemia He is intolerant of statins, bempedoic acid, PCSK9 inhibitors and ezetimibe.  He is currently enrolled in the Onyx 4 trial.  We will request most recent records from primary care.     Dispo:  Return in about 6 months (around 02/24/2021) for Routine Follow Up, w/ Dr. Elease Hashimoto, in person.   Medication Adjustments/Labs and Tests Ordered: Current medicines are reviewed at length with the patient today.  Concerns regarding medicines are outlined above.  Tests Ordered: No orders of the defined types were placed in this encounter.  Medication Changes: Meds ordered this encounter  Medications  . clopidogrel (PLAVIX) 75 MG tablet    Sig: Take 1 tablet (75 mg total) by mouth daily.    Dispense:  90 tablet    Refill:  3    Signed, Tereso Newcomer, PA-C  08/27/2020 5:02 PM    [facility] [address] [place], [place], [place]  [address] Phone: [phone]; Fax: [phone]   ADDENDUM Labs from PCP personally reviewed. 08/22/20: Hgb 13.9, K+ 4.9, SCr 1.40, ALT 18, LDL 115 Tereso Newcomer, PA-C    09/19/2020 12:22 PM

## 2020-08-27 ENCOUNTER — Other Ambulatory Visit: Payer: Self-pay

## 2020-08-27 ENCOUNTER — Ambulatory Visit (INDEPENDENT_AMBULATORY_CARE_PROVIDER_SITE_OTHER): Payer: Medicare Other | Admitting: Physician Assistant

## 2020-08-27 ENCOUNTER — Encounter: Payer: Self-pay | Admitting: Physician Assistant

## 2020-08-27 VITALS — BP 138/60 | HR 78 | Ht 74.0 in | Wt 222.0 lb

## 2020-08-27 DIAGNOSIS — I480 Paroxysmal atrial fibrillation: Secondary | ICD-10-CM | POA: Diagnosis not present

## 2020-08-27 DIAGNOSIS — I25119 Atherosclerotic heart disease of native coronary artery with unspecified angina pectoris: Secondary | ICD-10-CM

## 2020-08-27 DIAGNOSIS — E782 Mixed hyperlipidemia: Secondary | ICD-10-CM

## 2020-08-27 DIAGNOSIS — I10 Essential (primary) hypertension: Secondary | ICD-10-CM | POA: Diagnosis not present

## 2020-08-27 MED ORDER — CLOPIDOGREL BISULFATE 75 MG PO TABS
75.0000 mg | ORAL_TABLET | Freq: Every day | ORAL | 3 refills | Status: DC
Start: 2020-08-27 — End: 2021-08-19

## 2020-08-27 NOTE — Patient Instructions (Signed)
Medication Instructions:  Your physician recommends that you continue on your current medications as directed. Please refer to the Current Medication list given to you today.  *If you need a refill on your cardiac medications before your next appointment, please call your pharmacy*   Lab Work: None ordered   If you have labs (blood work) drawn today and your tests are completely normal, you will receive your results only by: . MyChart Message (if you have MyChart) OR . A paper copy in the mail If you have any lab test that is abnormal or we need to change your treatment, we will call you to review the results.   Testing/Procedures: None ordered    Follow-Up: At [facility], you and your health needs are our priority.  As part of our continuing mission to provide you with exceptional heart care, we have created designated Provider Care Teams.  These Care Teams include your primary Cardiologist (physician) and Advanced Practice Providers (APPs -  Physician Assistants and Nurse Practitioners) who all work together to provide you with the care you need, when you need it.  We recommend signing up for the patient portal called "MyChart".  Sign up information is provided on this After Visit Summary.  MyChart is used to connect with patients for Virtual Visits (Telemedicine).  Patients are able to view lab/test results, encounter notes, upcoming appointments, etc.  Non-urgent messages can be sent to your provider as well.   To learn more about what you can do with MyChart, go to [email or web address].    Your next appointment:   6 month(s)  The format for your next appointment:   In Person  Provider:   You may see Philip Nahser, MD or one of the following Advanced Practice Providers on your designated Care Team:    Scott Weaver, PA-C  Vin Bhagat, PA-C    Other Instructions None   

## 2020-10-06 ENCOUNTER — Telehealth: Payer: Self-pay

## 2020-10-06 NOTE — Telephone Encounter (Signed)
**Note De-Identified Karlene Southard Obfuscation** The pts completed BMSPAF application with documents was left at the office. I have completed the MD page of the application and have emailed all 18 pages to Dr Namon Cirri nurse so she can obtain his signature, date it, and to fax all to [facility] at fax number written on cover letter included or to take to Medical Records and place in the nurses box to be faxed.

## 2020-10-07 NOTE — Telephone Encounter (Signed)
Information faxed per Elease Hashimoto Via request.

## 2020-10-24 ENCOUNTER — Other Ambulatory Visit: Payer: Self-pay | Admitting: Pharmacist

## 2020-10-24 ENCOUNTER — Telehealth: Payer: Self-pay | Admitting: Pharmacist

## 2020-10-24 DIAGNOSIS — I480 Paroxysmal atrial fibrillation: Secondary | ICD-10-CM

## 2020-10-24 NOTE — Telephone Encounter (Signed)
Pt called clinic stating he was denied Eliquis pt assistance and wants [place] to send in a refill [facility] his [facility]. Last SCr was 1.49 in 02/2020. Called pt and asked him to come in for repeat CBC and BMET so that we can ensure he remains on the correct dose of Eliquis. He will come in next Tuesday for labs and has enough Eliquis for the next week. Will send in refill once dosing is determined based on lab results.

## 2020-10-24 NOTE — Progress Notes (Signed)
Pt called clinic stating he was denied Eliquis pt assistance and wants us to send in a refill to his [facility]. Last SCr was 1.49 in 02/2020. Called pt and asked him to come in for repeat CBC and BMET so that we can ensure he remains on the correct dose of Eliquis. He will come in next Tuesday for labs and has enough Eliquis for the next week. Will send in refill once dosing is determined based on lab results. 

## 2020-10-26 ENCOUNTER — Emergency Department (HOSPITAL_COMMUNITY): Payer: Medicare Other

## 2020-10-26 ENCOUNTER — Other Ambulatory Visit: Payer: Self-pay

## 2020-10-26 ENCOUNTER — Encounter (HOSPITAL_COMMUNITY): Payer: Self-pay

## 2020-10-26 ENCOUNTER — Inpatient Hospital Stay (HOSPITAL_COMMUNITY)
Admission: EM | Admit: 2020-10-26 | Discharge: 2020-10-28 | DRG: 287 | Disposition: A | Discharge status: Home / Self Care | Payer: Medicare Other | Attending: Internal Medicine | Admitting: Internal Medicine

## 2020-10-26 DIAGNOSIS — Z7902 Long term (current) use of antithrombotics/antiplatelets: Secondary | ICD-10-CM

## 2020-10-26 DIAGNOSIS — I480 Paroxysmal atrial fibrillation: Secondary | ICD-10-CM | POA: Diagnosis present

## 2020-10-26 DIAGNOSIS — Z20822 Contact with and (suspected) exposure to covid-19: Secondary | ICD-10-CM | POA: Diagnosis present

## 2020-10-26 DIAGNOSIS — Z79899 Other long term (current) drug therapy: Secondary | ICD-10-CM

## 2020-10-26 DIAGNOSIS — Z955 Presence of coronary angioplasty implant and graft: Secondary | ICD-10-CM

## 2020-10-26 DIAGNOSIS — T82855A Stenosis of coronary artery stent, initial encounter: Secondary | ICD-10-CM | POA: Diagnosis present

## 2020-10-26 DIAGNOSIS — E785 Hyperlipidemia, unspecified: Secondary | ICD-10-CM | POA: Diagnosis present

## 2020-10-26 DIAGNOSIS — I2510 Atherosclerotic heart disease of native coronary artery without angina pectoris: Secondary | ICD-10-CM | POA: Diagnosis present

## 2020-10-26 DIAGNOSIS — I10 Essential (primary) hypertension: Secondary | ICD-10-CM | POA: Diagnosis present

## 2020-10-26 DIAGNOSIS — E1122 Type 2 diabetes mellitus with diabetic chronic kidney disease: Secondary | ICD-10-CM | POA: Diagnosis present

## 2020-10-26 DIAGNOSIS — E7800 Pure hypercholesterolemia, unspecified: Secondary | ICD-10-CM | POA: Diagnosis present

## 2020-10-26 DIAGNOSIS — E1159 Type 2 diabetes mellitus with other circulatory complications: Secondary | ICD-10-CM | POA: Diagnosis present

## 2020-10-26 DIAGNOSIS — Z888 Allergy status to other drugs, medicaments and biological substances status: Secondary | ICD-10-CM | POA: Diagnosis not present

## 2020-10-26 DIAGNOSIS — I252 Old myocardial infarction: Secondary | ICD-10-CM

## 2020-10-26 DIAGNOSIS — Z7901 Long term (current) use of anticoagulants: Secondary | ICD-10-CM | POA: Diagnosis not present

## 2020-10-26 DIAGNOSIS — Z951 Presence of aortocoronary bypass graft: Secondary | ICD-10-CM

## 2020-10-26 DIAGNOSIS — N182 Chronic kidney disease, stage 2 (mild): Secondary | ICD-10-CM | POA: Diagnosis present

## 2020-10-26 DIAGNOSIS — Z8673 Personal history of transient ischemic attack (TIA), and cerebral infarction without residual deficits: Secondary | ICD-10-CM

## 2020-10-26 DIAGNOSIS — Z981 Arthrodesis status: Secondary | ICD-10-CM | POA: Diagnosis not present

## 2020-10-26 DIAGNOSIS — Z8249 Family history of ischemic heart disease and other diseases of the circulatory system: Secondary | ICD-10-CM

## 2020-10-26 DIAGNOSIS — I200 Unstable angina: Secondary | ICD-10-CM

## 2020-10-26 DIAGNOSIS — Y831 Surgical operation with implant of artificial internal device as the cause of abnormal reaction of the patient, or of later complication, without mention of misadventure at the time of the procedure: Secondary | ICD-10-CM | POA: Diagnosis present

## 2020-10-26 DIAGNOSIS — Z7984 Long term (current) use of oral hypoglycemic drugs: Secondary | ICD-10-CM

## 2020-10-26 DIAGNOSIS — I25110 Atherosclerotic heart disease of native coronary artery with unstable angina pectoris: Secondary | ICD-10-CM | POA: Diagnosis not present

## 2020-10-26 DIAGNOSIS — E119 Type 2 diabetes mellitus without complications: Secondary | ICD-10-CM | POA: Diagnosis not present

## 2020-10-26 HISTORY — DX: Other specified health status: Z78.9

## 2020-10-26 HISTORY — DX: Chronic kidney disease, stage 2 (mild): N18.2

## 2020-10-26 LAB — CBC WITH DIFFERENTIAL/PLATELET
Abs Immature Granulocytes: 0.03 10*3/uL (ref 0.00–0.07)
Basophils Absolute: 0.1 10*3/uL (ref 0.0–0.1)
Basophils Relative: 1 %
Eosinophils Absolute: 0.4 10*3/uL (ref 0.0–0.5)
Eosinophils Relative: 6 %
HCT: 37.8 % — ABNORMAL LOW (ref 39.0–52.0)
Hemoglobin: 13.4 g/dL (ref 13.0–17.0)
Immature Granulocytes: 0 %
Lymphocytes Relative: 21 %
Lymphs Abs: 1.7 10*3/uL (ref 0.7–4.0)
MCH: 32 pg (ref 26.0–34.0)
MCHC: 35.4 g/dL (ref 30.0–36.0)
MCV: 90.2 fL (ref 80.0–100.0)
Monocytes Absolute: 0.6 10*3/uL (ref 0.1–1.0)
Monocytes Relative: 7 %
Neutro Abs: 5.2 10*3/uL (ref 1.7–7.7)
Neutrophils Relative %: 65 %
Platelets: 247 10*3/uL (ref 150–400)
RBC: 4.19 MIL/uL — ABNORMAL LOW (ref 4.22–5.81)
RDW: 12.2 % (ref 11.5–15.5)
WBC: 8 10*3/uL (ref 4.0–10.5)
nRBC: 0 % (ref 0.0–0.2)

## 2020-10-26 LAB — COMPREHENSIVE METABOLIC PANEL
ALT: 25 U/L (ref 0–44)
AST: 26 U/L (ref 15–41)
Albumin: 3.6 g/dL (ref 3.5–5.0)
Alkaline Phosphatase: 56 U/L (ref 38–126)
Anion gap: 7 (ref 5–15)
BUN: 23 mg/dL (ref 8–23)
CO2: 24 mmol/L (ref 22–32)
Calcium: 9.3 mg/dL (ref 8.9–10.3)
Chloride: 106 mmol/L (ref 98–111)
Creatinine, Ser: 1.14 mg/dL (ref 0.61–1.24)
GFR, Estimated: >60 mL/min (ref >60)
Glucose, Bld: 252 mg/dL — ABNORMAL HIGH (ref 70–99)
Potassium: 4.2 mmol/L (ref 3.5–5.1)
Sodium: 137 mmol/L (ref 135–145)
Total Bilirubin: 0.6 mg/dL (ref 0.3–1.2)
Total Protein: 6 g/dL — ABNORMAL LOW (ref 6.5–8.1)

## 2020-10-26 LAB — TROPONIN I (HIGH SENSITIVITY)
Troponin I (High Sensitivity): 15 ng/L (ref <18)
Troponin I (High Sensitivity): 15 ng/L (ref <18)

## 2020-10-26 LAB — RESP PANEL BY RT-PCR (FLU A&B, COVID) ARPGX2
Influenza A by PCR: NEGATIVE
Influenza B by PCR: NEGATIVE
SARS Coronavirus 2 by RT PCR: NEGATIVE

## 2020-10-26 MED ORDER — FLUTICASONE PROPIONATE 50 MCG/ACT NA SUSP: 2.0000 | Freq: Every day | NASAL | Status: DC | PRN

## 2020-10-26 MED ORDER — ISOSORBIDE MONONITRATE ER 60 MG PO TB24
120.0000 mg | ORAL_TABLET | Freq: Every morning | ORAL | Status: DC
  Administered 2020-10-27 – 2020-10-28 (×2): 120 mg via ORAL
  Filled 2020-10-26 (×2): qty 2

## 2020-10-26 MED ORDER — ONDANSETRON HCL 4 MG/2ML IJ SOLN: 4.0000 mg | Freq: Four times a day (QID) | INTRAMUSCULAR | Status: DC | PRN

## 2020-10-26 MED ORDER — ACETAMINOPHEN 500 MG PO TABS: 1000.0000 mg | ORAL_TABLET | ORAL | Status: DC | PRN

## 2020-10-26 MED ORDER — DILTIAZEM HCL 30 MG PO TABS: 30.0000 mg | ORAL_TABLET | ORAL | Status: DC | PRN

## 2020-10-26 MED ORDER — IRBESARTAN 150 MG PO TABS
300.0000 mg | ORAL_TABLET | Freq: Every day | ORAL | Status: DC
  Administered 2020-10-27 – 2020-10-28 (×2): 300 mg via ORAL
  Filled 2020-10-26 (×2): qty 2

## 2020-10-26 MED ORDER — VITAMIN D 25 MCG (1000 UNIT) PO TABS
1000.0000 [IU] | ORAL_TABLET | Freq: Every day | ORAL | Status: DC
  Administered 2020-10-27 – 2020-10-28 (×3): 1000 [IU] via ORAL
  Filled 2020-10-26 (×3): qty 1

## 2020-10-26 MED ORDER — NITROGLYCERIN 0.4 MG SL SUBL
0.4000 mg | SUBLINGUAL_TABLET | SUBLINGUAL | Status: DC | PRN
  Administered 2020-10-26 – 2020-10-27 (×3): 0.4 mg via SUBLINGUAL
  Filled 2020-10-26 (×2): qty 1

## 2020-10-26 MED ORDER — OMEGA-3-ACID ETHYL ESTERS 1 G PO CAPS
2.0000 g | ORAL_CAPSULE | Freq: Every day | ORAL | Status: DC
  Administered 2020-10-27 – 2020-10-28 (×2): 2 g via ORAL
  Filled 2020-10-26 (×2): qty 2

## 2020-10-26 MED ORDER — METFORMIN HCL ER 500 MG PO TB24
500.0000 mg | ORAL_TABLET | Freq: Every day | ORAL | Status: DC
  Filled 2020-10-26: qty 1

## 2020-10-26 MED ORDER — ASPIRIN 81 MG PO CHEW
324.0000 mg | CHEWABLE_TABLET | ORAL | Status: AC
  Administered 2020-10-26: 324 mg via ORAL
  Filled 2020-10-26: qty 4

## 2020-10-26 MED ORDER — ACETAMINOPHEN 500 MG PO TABS
1000.0000 mg | ORAL_TABLET | Freq: Four times a day (QID) | ORAL | Status: DC | PRN
  Administered 2020-10-27: 1000 mg via ORAL
  Filled 2020-10-26: qty 2

## 2020-10-26 MED ORDER — DILTIAZEM HCL 60 MG PO TABS: 30.0000 mg | ORAL_TABLET | Freq: Four times a day (QID) | ORAL | Status: DC | PRN

## 2020-10-26 MED ORDER — AMLODIPINE BESYLATE 10 MG PO TABS
10.0000 mg | ORAL_TABLET | Freq: Every day | ORAL | Status: DC
  Administered 2020-10-27 – 2020-10-28 (×2): 10 mg via ORAL
  Filled 2020-10-26 (×2): qty 1

## 2020-10-26 MED ORDER — CARVEDILOL 3.125 MG PO TABS
3.1250 mg | ORAL_TABLET | Freq: Two times a day (BID) | ORAL | Status: DC
  Administered 2020-10-26 – 2020-10-27 (×2): 3.125 mg via ORAL
  Filled 2020-10-26 (×2): qty 1

## 2020-10-26 MED ORDER — CLOPIDOGREL BISULFATE 75 MG PO TABS
75.0000 mg | ORAL_TABLET | Freq: Every day | ORAL | Status: DC
  Administered 2020-10-27 – 2020-10-28 (×2): 75 mg via ORAL
  Filled 2020-10-26 (×2): qty 1

## 2020-10-26 MED ORDER — APIXABAN 5 MG PO TABS: 5.0000 mg | ORAL_TABLET | Freq: Two times a day (BID) | ORAL | Status: DC

## 2020-10-26 MED ORDER — ASPIRIN 300 MG RE SUPP: 300.0000 mg | RECTAL | Status: AC

## 2020-10-26 MED ORDER — INSULIN GLARGINE 100 UNIT/ML ~~LOC~~ SOLN
28.0000 [IU] | Freq: Every day | SUBCUTANEOUS | Status: DC
  Administered 2020-10-27 – 2020-10-28 (×2): 28 [IU] via SUBCUTANEOUS
  Filled 2020-10-26 (×2): qty 0.28

## 2020-10-26 MED ORDER — HEPARIN (PORCINE) 25000 UT/250ML-% IV SOLN
1550.0000 [IU]/h | INTRAVENOUS | Status: DC
  Administered 2020-10-26: 1250 [IU]/h via INTRAVENOUS
  Filled 2020-10-26: qty 250

## 2020-10-26 MED ORDER — NITROGLYCERIN 0.4 MG SL SUBL: 0.4000 mg | SUBLINGUAL_TABLET | SUBLINGUAL | Status: DC | PRN

## 2020-10-26 MED ORDER — GLIPIZIDE 10 MG PO TABS
10.0000 mg | ORAL_TABLET | Freq: Two times a day (BID) | ORAL | Status: DC
  Administered 2020-10-27 – 2020-10-28 (×2): 10 mg via ORAL
  Filled 2020-10-26 (×3): qty 1

## 2020-10-26 NOTE — Progress Notes (Signed)
ANTICOAGULATION CONSULT NOTE - Initial Consult  Pharmacy Consult for heparin Indication: chest pain/ACS  Allergies  Allergen Reactions  . Bempedoic Acid Other (See Comments)    NEXLETOL Myalgias, GI upset  . [name] [Alirocumab] Other (See Comments)    Muscle weakness and rash  . Rosuvastatin Other (See Comments)    Low energy/leg and hip pain  . Zetia [Ezetimibe] Other (See Comments)    myalgias    Patient Measurements: Height: 6\' 2"  (188 cm) Weight: 97.1 kg (214 lb) IBW/kg (Calculated) : 82.2 Heparin Dosing Weight: 97.1kg  Vital Signs: Temp: 97.9 F (36.6 C) (04/10 1734) Temp Source: Oral (04/10 1734) BP: 139/75 (04/10 2045) Pulse Rate: 58 (04/10 2045)  Labs: Recent Labs    10/26/20 1848  HGB 13.4  HCT 37.8*  PLT 247  CREATININE 1.14  TROPONINIHS 15    Estimated Creatinine Clearance: 58.1 mL/min (by C-G formula based on SCr of 1.14 mg/dL).   Medical History: Past Medical History:  Diagnosis Date  . Abdominal discomfort    Abdominal discomfort: EGD 2011 showed no significant abnormalities.  Possibly due to metformin.  . Allergic rhinitis   . Anticoagulated 05/30/2016  . CAD (coronary artery disease)    a. s/p MI in 2004:  stents to the LAD and staged stent RCA, EF 55%;  b.  NSTEMI (1/16):  LHC - mid LAD stent with diff dist restenosis, prox to mid Dx mod dsz jailed by stent, small OM1 99, mCFX 75, dCFX 80, mRCA stent ok, mPDA mild to mod dsz, EF 55% >> PCI:  Synergy DES to LAD; Synergy DES (x2) mid and dist CFX  . CAD in native artery 06/12/2019  . 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  s/p back surgery  . DM (diabetes mellitus) (HCC)   . History of CVA (cerebrovascular accident)    Small CVA seen by head CT (incidental) in 1/13.  Carotid dopplers in 1/13 showed minimal disease  . History of Doppler ultrasound    Arterial dopplers (3/11): no evidence for significant PAD. ABIs 10/12: Normal.     . HLD (hyperlipidemia)   . HTN (hypertension)    had side effects with chlorthalidone  . Hx of cardiovascular stress test    Myoview (1/13): Small fixed apical septal defect with no ischemia, EF 58%.  03-18-1995 Hx of echocardiogram    Echo (3/11): EF 60%, normal wall motion, mild MR, mild left atrial enlargement, mildly dilated ascending aorta.  11-24-1976 PAF (paroxysmal atrial fibrillation) (HCC) 05/29/2016  . Spondylolisthesis    L4-S1    Medications:  Infusions:  . heparin      Assessment: 80 yom presented to the ED with CP. To start IV heparin. Pt is on apixaban PTA for history of afib. Baseline CBC is WNL and no bleeding noted.   Goal of Therapy:  Heparin level 0.3-0.7 units/ml aPTT 66-102 seconds Monitor platelets by anticoagulation protocol: Yes   Plan:  Heparin gtt 1250 units/hr - based on previous dosing, no bolus d/t apixaban use Check an 8 hr heparin level and aPTT Daily heparin level, aPTT and CBC  Julianne Chamberlin, 13/05/2016 10/26/2020,8:55 PM

## 2020-10-26 NOTE — ED Provider Notes (Signed)
[facility] EMERGENCY DEPARTMENT Provider Note   CSN: [id number] Arrival date & time: 10/26/20  1714     History Chief Complaint  Patient presents with  . Chest Pain    Joseph [name] is a 80 y.o. male past medical history of multivessel CAD, hypertension, hyperlipidemia, paroxysmal A. fib, CVA, type 2 diabetes, presenting via EMS from urgent care for chest pain.  Patient states chest pain began around 7:30 AM this morning while at rest.  Pain is described as a substernal aching pain without associated symptoms.  Pain is constant throughout the day, improved with nitroglycerin given by EMS.  It is gradually returning on evaluation.  States last night he had some heartburn sensation which resolved.  States when he had heart attack in the past it felt more of a burning sensation in his chest.  He has been having symptoms of stable angina for some time now, however today is the first day it occurred at rest.  He is compliant on his Eliquis and Plavix.  Denies shortness of breath, nausea, diaphoresis, radiation of chest pain, new lower extremity swelling.  Followed by Dr. Elease Hashimoto with cardiology.   Per recent cardiology note: "He is status post anterior wall myocardial infarction in 2004 treated with a stent to the LAD with a staged PCI to the right coronary artery.  Also had an non-ST segment elevation myocardial infarction in January 2016 treated treated with a DES to the mid LAD and a DES to the mid and distal circumflex artery."  Recent cardiac catheterization in Nov 2020: "Conclusions: 1. Multivessel coronary artery disease, not significantly changed compared with prior catheterization on 05/18/2019. 2. Patent LAD, LCx, and RCA stents.  The LAD stent(s) demonstrate moderate in-stent restenosis. 3. Normal left ventricular filling pressure. Recommendations: 1. Films reviewed with interventional team. We have agreed to continued escalation of medical therapy, as targets are  suboptimal for percutaneous intervention and CABG. 2. Start ranolazine 500 mg BID. Continue other antianginal therapy. 3. Continue clopidogrel. If no bleeding complications, apixaban can be restarted tomorrow and aspirin discontinued. 4. Aggressive secondary prevention."    The history is provided by the patient and medical records.       Past Medical History:  Diagnosis Date  . Abdominal discomfort    Abdominal discomfort: EGD 2011 showed no significant abnormalities.  Possibly due to metformin.  . Allergic rhinitis   . Anticoagulated 05/30/2016  . CAD (coronary artery disease)    a. s/p MI in 2004:  stents to the LAD and staged stent RCA, EF 55%;  b.  NSTEMI (1/16):  LHC - mid LAD stent with diff dist restenosis, prox to mid Dx mod dsz jailed by stent, small OM1 99, mCFX 75, dCFX 80, mRCA stent ok, mPDA mild to mod dsz, EF 55% >> PCI:  Synergy DES to LAD; Synergy DES (x2) mid and dist CFX  . CAD in native artery 06/12/2019  . 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  s/p back surgery  . DM (diabetes mellitus) (HCC)   . History of CVA (cerebrovascular accident)    Small CVA seen by head CT (incidental) in 1/13.  Carotid dopplers in 1/13 showed minimal disease  . History of Doppler ultrasound    Arterial dopplers (3/11): no evidence for significant PAD. ABIs 10/12: Normal.    . HLD (hyperlipidemia)   . HTN (hypertension)    had side effects with chlorthalidone  .  Hx of cardiovascular stress test    Myoview (1/13): Small fixed apical septal defect with no ischemia, EF 58%.  [name] Hx of echocardiogram    Echo (3/11): EF 60%, normal wall motion, mild MR, mild left atrial enlargement, mildly dilated ascending aorta.  [name] PAF (paroxysmal atrial fibrillation) (HCC) 05/29/2016  . Spondylolisthesis    L4-S1    Patient Active Problem List   Diagnosis Date Noted  . CAD in native artery 06/12/2019  . Chest pain 06/07/2019  .  Unstable angina (HCC) 05/17/2019  . Lower GI bleed 12/26/2017  . Hypotension 12/26/2017  . Postural dizziness with presyncope 12/26/2017  . Fatigue 09/22/2017  . 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 Hyperlipidemia 04/11/2017  . Anticoagulated 05/30/2016  . Paroxysmal atrial fibrillation (HCC) 05/29/2016  . Exertional angina (HCC) 08/02/2014  . Bradycardia 02/02/2012  . CVA (cerebral infarction) 08/01/2011  . Leg pain 03/31/2011  . Abdominal pain 10/13/2010  . Hip pain 10/13/2010  . Pure hypercholesterolemia 09/09/2009  . Essential hypertension 09/09/2009  . Peripheral vascular disease (HCC) 09/09/2009  . 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  . CAD S/P percutaneous coronary angioplasty 09/08/2009    Past Surgical History:  Procedure Laterality Date  . COLONOSCOPY N/A 12/26/2017   Procedure: COLONOSCOPY;  Surgeon: Benancio Deeds, MD;  Location: [facility] ENDOSCOPY;  Service: Gastroenterology;  Laterality: N/A;  . COLONOSCOPY W/ POLYPECTOMY  12/2017  . CORONARY ANGIOGRAM  11/13/2014   Procedure: CORONARY ANGIOGRAM;  Surgeon: Laurey Morale, MD;  Location: [facility] CATH LAB;  Service: Cardiovascular;;  . CORONARY STENT INTERVENTION N/A 05/18/2019   Procedure: CORONARY STENT INTERVENTION;  Surgeon: Marykay Lex, MD;  Location: [facility] INVASIVE CV LAB;  Service: Cardiovascular;  Laterality: N/A;  . CORONARY STENT PLACEMENT  2004   x2 LAD and RCA   . LEFT HEART CATH AND CORONARY ANGIOGRAPHY N/A 05/18/2019   Procedure: LEFT HEART CATH AND CORONARY ANGIOGRAPHY;  Surgeon: Marykay Lex, MD;  Location: [facility] INVASIVE CV LAB;  Service: Cardiovascular;  Laterality: N/A;  . LEFT HEART CATH AND CORONARY ANGIOGRAPHY N/A 06/11/2019   Procedure: LEFT HEART CATH AND CORONARY ANGIOGRAPHY;  Surgeon: Yvonne Kendall, MD;  Location: [facility] INVASIVE CV LAB;  Service: Cardiovascular;  Laterality: N/A;  . LEFT HEART  CATHETERIZATION WITH CORONARY ANGIOGRAM N/A 08/02/2014   Procedure: LEFT HEART CATHETERIZATION WITH CORONARY ANGIOGRAM;  Surgeon: Corky Crafts, MD;  Location: [facility] CATH LAB;  Service: Cardiovascular;  Laterality: N/A;  . PROSTATE SURGERY    . SPINAL FUSION     L4-S1       Family History  Problem Relation Age of Onset  . Heart attack Mother   . Heart attack Father   . Stroke Neg Hx     Social History   Tobacco Use  . Smoking status: Never Smoker  . Smokeless tobacco: Never Used  Vaping Use  . Vaping Use: Never used  Substance Use Topics  . Alcohol use: No  . Drug use: No    Home Medications Prior to Admission medications   Medication Sig Start Date End Date Taking? Authorizing Provider  amLODipine (NORVASC) 10 MG tablet Take 10 mg by mouth daily. 05/22/20  Yes [provider]  apixaban (ELIQUIS) 5 MG TABS tablet Take 1 tablet (5 mg total) by mouth 2 (two) times daily. DO NOT TAKE FOR 3 MORE DAYS. MAY RESTART TAKING IT ON 01/01/2018. 12/28/17  Yes Hongalgi, Maximino Greenland,  MD  carvedilol (COREG) 3.125 MG tablet Take 3.125 mg by mouth 2 (two) times daily.  08/09/18  Yes [provider]  clopidogrel (PLAVIX) 75 MG tablet Take 1 tablet (75 mg total) by mouth daily. 08/27/20  Yes Weaver, Scott T, PA-C  glipiZIDE (GLUCOTROL) 10 MG tablet Take 10 mg by mouth 2 (two) times daily. 01/08/20  Yes [provider]  isosorbide mononitrate (IMDUR) 30 MG 24 hr tablet Take 30 mg by mouth every evening.   Yes [provider]  metFORMIN (GLUCOPHAGE-XR) 500 MG 24 hr tablet Take 500 mg by mouth daily. 08/29/20  Yes [provider]  nitroGLYCERIN (NITROSTAT) 0.4 MG SL tablet Place 1 tablet (0.4 mg total) under the tongue every 5 (five) minutes as needed for chest pain (x 3 doses). 06/12/19  Yes Nahser, Deloris Ping, MD  TRESIBA FLEXTOUCH 100 UNIT/ML FlexTouch Pen Inject 28 Units into the skin daily.  09/10/19  Yes [provider]  acetaminophen (TYLENOL) 500 MG  tablet Take 1,000 mg by mouth as needed for moderate pain.    [provider]  Cholecalciferol (VITAMIN D3) 25 MCG (1000 UT) CAPS Take 1 capsule by mouth daily.    [provider]  Coenzyme Q10 100 MG capsule Take 1 capsule by mouth daily.    [provider]  diltiazem (CARDIZEM) 30 MG tablet Take 1 tablet (30 mg total) by mouth as needed (Rapid Palpitations). 01/31/18   Tereso Newcomer T, PA-C  fish oil-omega-3 fatty acids 1000 MG capsule Take 2 g by mouth in the morning and at bedtime.    [provider]  fluticasone (FLONASE) 50 MCG/ACT nasal spray Place 2 sprays into both nostrils daily as needed for allergies or rhinitis.  03/31/15   [provider]  hydroxypropyl methylcellulose / hypromellose (ISOPTO TEARS / GONIOVISC) 2.5 % ophthalmic solution Place 1 drop into both eyes 3 (three) times daily as needed for dry eyes.    [provider]  isosorbide mononitrate (IMDUR) 120 MG 24 hr tablet Take 120 mg by mouth every morning.    [provider]  olmesartan (BENICAR) 20 MG tablet Take 20 mg by mouth daily. 03/13/20   [provider]  olmesartan (BENICAR) 40 MG tablet Take 40 mg by mouth daily. 08/22/20   [provider]  Study - [name] - inclisiran 300 mg/1.42mL or placebo SQ injection (PI-Stuckey) Inject 300 mg into the skin every 6 (six) months. 05/03/19   [provider]  triamcinolone cream (KENALOG) 0.5 % Apply 1 application topically as needed for irritation. 08/24/17   [provider]    Allergies    Bempedoic acid, [name] [alirocumab], Rosuvastatin, and Zetia [ezetimibe]  Review of Systems   Review of Systems  Cardiovascular: Positive for chest pain.  All other systems reviewed and are negative.   Physical Exam Updated Vital Signs BP (!) 145/79   Pulse (!) 57   Temp 97.9 F (36.6 C) (Oral)   Resp 15   Ht  (1.88 m)   Wt 97.1 kg   SpO2 95%   BMI 27.48 kg/m   Physical  Exam Vitals and nursing note reviewed.  Constitutional:      General: He is not in acute distress.    Appearance: He is well-developed.  HENT:     Head: Normocephalic and atraumatic.  Eyes:     Conjunctiva/sclera: Conjunctivae normal.  Cardiovascular:     Rate and Rhythm: Normal rate and regular rhythm.     Pulses:  Normal pulses.  Pulmonary:     Effort: Pulmonary effort is normal.     Breath sounds: Normal breath sounds.  Abdominal:     Palpations: Abdomen is soft.  Musculoskeletal:     Comments: Trace pitting edema b/l ankles (chronic)  Skin:    General: Skin is warm.  Neurological:     Mental Status: He is alert.  Psychiatric:        Behavior: Behavior normal.     ED Results / Procedures / Treatments   Labs (all labs ordered are listed, but only abnormal results are displayed) Labs Reviewed  COMPREHENSIVE METABOLIC PANEL - Abnormal; Notable for the following components:      Result Value   Glucose, Bld 252 (*)    Total Protein 6.0 (*)    All other components within normal limits  CBC WITH DIFFERENTIAL/PLATELET - Abnormal; Notable for the following components:   RBC 4.19 (*)    HCT 37.8 (*)    All other components within normal limits  RESP PANEL BY RT-PCR (FLU A&B, COVID) ARPGX2  TROPONIN I (HIGH SENSITIVITY)  TROPONIN I (HIGH SENSITIVITY)    EKG EKG Interpretation  Date/Time:  Sunday October 26 2020 17:40:07 EDT Ventricular Rate:  60 PR Interval:  234 QRS Duration: 96 QT Interval:  406 QTC Calculation: 406 R Axis:   -33 Text Interpretation: Sinus rhythm with 1st degree A-V block Left axis deviation Septal infarct , age undetermined Abnormal ECG No significant change since last tracing Confirmed by Jacalyn Lefevre [id number]) on 10/26/2020 5:46:18 PM   Radiology DG Chest 2 View  Result Date: 10/26/2020 CLINICAL DATA:  Acute chest pain EXAM: CHEST - 2 VIEW COMPARISON:  04/17/2020 FINDINGS: The cardiomediastinal silhouette is unremarkable. Mild LEFT basilar  atelectasis/scarring again noted. There is no evidence of focal airspace disease, pulmonary edema, suspicious pulmonary nodule/mass, pleural effusion, or pneumothorax. No acute bony abnormalities are identified. IMPRESSION: Mild LEFT basilar atelectasis/scarring. Electronically Signed   By: Harmon Pier M.D.   On: 10/26/2020 18:32    Procedures Procedures   Medications Ordered in ED Medications  nitroGLYCERIN (NITROSTAT) SL tablet 0.4 mg (0.4 mg Sublingual Given 10/26/20 1853)    ED Course  I have reviewed the triage vital signs and the nursing notes.  Pertinent labs & imaging results that were available during my care of the patient were reviewed by me and considered in my medical decision making (see chart for details).  Clinical Course as of 10/26/20 2043  Sun Oct 26, 2020  2039 Consulted with Dr. Lysle Morales with cardiology. Will admit for further management of CP concerning for ACS. [JR]    Clinical Course User Index [JR] Cordelia Bessinger, [place] N, PA-C   MDM Rules/Calculators/A&P                          Patient with history of multi vessel CAD with stent placement, followed by Dr. Elease Hashimoto, presenting for symptoms concerning for unstable angina that began today.  He is compliant on his Eliquis and Plavix.  Has history of stable angina, however states today is the first day he ever had symptoms at rest.  His symptoms are improved with sublingual nitroglycerin.  His first troponin is 15, EKG shows no evidence of acute ischemia.  Given concern for unstable angina, consult was placed to cardiology.  Discussed with Dr. Lysle Morales, who will admit for further management of ACS.  Discussed results, findings, treatment and follow up. Patient advised of return precautions. Patient  verbalized understanding and agreed with plan.  Final Clinical Impression(s) / ED Diagnoses Final diagnoses:  Unstable angina [facility](HCC)    Rx / DC Orders ED Discharge Orders    None       Wilferd Ritson, [place] N, PA-C 10/26/20  2043    Jacalyn LefevreHaviland, Julie, MD 10/26/20 2054

## 2020-10-26 NOTE — ED Notes (Signed)
Patient transported to X-ray 

## 2020-10-26 NOTE — H&P (Signed)
Cardiology Admission History and Physical:   Patient ID: Joseph [name] MRN: [id number]; DOB: July 26, 1940   Admission date: 10/26/2020  PCP:  Dema Severin, NP   [facility]  Cardiologist:  Kristeen Miss, MD  Advanced Practice Provider:  No care team member to [id number]    Chief Complaint: Chest pain  Patient Profile:   Joseph [name] is a 80 y.o. male with past medical history of multivessel CAD, hypertension, hyperlipidemia, paroxysmal A. fib, CVA, type 2 diabetes, presenting via EMS from urgent care for chest pain.   History of Present Illness:   Mr. Joseph [name]  states chest pain began around 7:30 AM this morning while at rest.  Pain is described as a substernal aching pain without associated symptoms.  Pain is constant throughout the day, improved with nitroglycerin for a few minutes only to recur . States last night he had some heartburn sensation which resolved.  States when he had heart attack in the past it felt more of a burning sensation in his chest.  He has been having symptoms of stable angina for some time now which is mostly with exertion and relieves quickly, however today is the first day it occurred at rest and persistent.  He is compliant on his Eliquis for PAF and Plavix.  Denies shortness of breath, nausea, diaphoresis, radiation of chest pain, new lower extremity swelling.  Followed by Dr. Elease Hashimoto with cardiology. He has had 7 stents in the past and last cath in 2020. Currently chest pain is better but his neck and back pain that is chronic is bothering him. He has jaw pain with statins and is on IV meds for cholesterol lowering.    Past Medical History:  Diagnosis Date  . Abdominal discomfort    Abdominal discomfort: EGD 2011 showed no significant abnormalities.  Possibly due to metformin.  . Allergic rhinitis   . Anticoagulated 05/30/2016  . CAD (coronary artery disease)    a. s/p MI in 2004:  stents to the LAD and staged stent RCA, EF  55%;  b.  NSTEMI (1/16):  LHC - mid LAD stent with diff dist restenosis, prox to mid Dx mod dsz jailed by stent, small OM1 99, mCFX 75, dCFX 80, mRCA stent ok, mPDA mild to mod dsz, EF 55% >> PCI:  Synergy DES to LAD; Synergy DES (x2) mid and dist CFX  . CAD in native artery 06/12/2019  . 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  s/p back surgery  . DM (diabetes mellitus) (HCC)   . History of CVA (cerebrovascular accident)    Small CVA seen by head CT (incidental) in 1/13.  Carotid dopplers in 1/13 showed minimal disease  . History of Doppler ultrasound    Arterial dopplers (3/11): no evidence for significant PAD. ABIs 10/12: Normal.    . HLD (hyperlipidemia)   . HTN (hypertension)    had side effects with chlorthalidone  . Hx of cardiovascular stress test    Myoview (1/13): Small fixed apical septal defect with no ischemia, EF 58%.  [name] Hx of echocardiogram    Echo (3/11): EF 60%, normal wall motion, mild MR, mild left atrial enlargement, mildly dilated ascending aorta.  [name] PAF (paroxysmal atrial fibrillation) (HCC) 05/29/2016  . Spondylolisthesis    L4-S1    Past Surgical History:  Procedure Laterality Date  . COLONOSCOPY N/A 12/26/2017   Procedure: COLONOSCOPY;  Surgeon: Benancio Deeds, MD;  Location: [facility] ENDOSCOPY;  Service: Gastroenterology;  Laterality: N/A;  . COLONOSCOPY W/ POLYPECTOMY  12/2017  . CORONARY ANGIOGRAM  11/13/2014   Procedure: CORONARY ANGIOGRAM;  Surgeon: Laurey Morale, MD;  Location: [facility] CATH LAB;  Service: Cardiovascular;;  . CORONARY STENT INTERVENTION N/A 05/18/2019   Procedure: CORONARY STENT INTERVENTION;  Surgeon: Marykay Lex, MD;  Location: [facility] INVASIVE CV LAB;  Service: Cardiovascular;  Laterality: N/A;  . CORONARY STENT PLACEMENT  2004   x2 LAD and RCA   . LEFT HEART CATH AND CORONARY ANGIOGRAPHY N/A 05/18/2019   Procedure: LEFT HEART CATH AND CORONARY ANGIOGRAPHY;  Surgeon: Marykay Lex, MD;  Location: [facility] INVASIVE CV LAB;  Service: Cardiovascular;  Laterality: N/A;  . LEFT HEART CATH AND CORONARY ANGIOGRAPHY N/A 06/11/2019   Procedure: LEFT HEART CATH AND CORONARY ANGIOGRAPHY;  Surgeon: Yvonne Kendall, MD;  Location: [facility] INVASIVE CV LAB;  Service: Cardiovascular;  Laterality: N/A;  . LEFT HEART CATHETERIZATION WITH CORONARY ANGIOGRAM N/A 08/02/2014   Procedure: LEFT HEART CATHETERIZATION WITH CORONARY ANGIOGRAM;  Surgeon: Corky Crafts, MD;  Location: [facility] CATH LAB;  Service: Cardiovascular;  Laterality: N/A;  . PROSTATE SURGERY    . SPINAL FUSION     L4-S1     Medications Prior to Admission: Prior to Admission medications   Medication Sig Start Date End Date Taking? Authorizing Provider  amLODipine (NORVASC) 10 MG tablet Take 10 mg by mouth daily. 05/22/20  Yes [provider]  carvedilol (COREG) 3.125 MG tablet Take 3.125 mg by mouth 2 (two) times daily.  08/09/18  Yes [provider]  clopidogrel (PLAVIX) 75 MG tablet Take 1 tablet (75 mg total) by mouth daily. 08/27/20  Yes Weaver, Scott T, PA-C  glipiZIDE (GLUCOTROL) 10 MG tablet Take 10 mg by mouth 2 (two) times daily. 01/08/20  Yes [provider]  isosorbide mononitrate (IMDUR) 30 MG 24 hr tablet Take 30 mg by mouth every evening.   Yes [provider]  metFORMIN (GLUCOPHAGE-XR) 500 MG 24 hr tablet Take 500 mg by mouth daily. 08/29/20  Yes [provider]  nitroGLYCERIN (NITROSTAT) 0.4 MG SL tablet Place 1 tablet (0.4 mg total) under the tongue every 5 (five) minutes as needed for chest pain (x 3 doses). 06/12/19  Yes Nahser, Deloris Ping, MD  TRESIBA FLEXTOUCH 100 UNIT/ML FlexTouch Pen Inject 28 Units into the skin daily.  09/10/19  Yes [provider]  acetaminophen (TYLENOL) 500 MG tablet Take 1,000 mg by mouth as needed for moderate pain.    [provider]  Cholecalciferol (VITAMIN D3) 25 MCG (1000 UT) CAPS Take 1 capsule by mouth daily.    [provider]  Coenzyme Q10 100 MG capsule Take 1 capsule by mouth daily.    [provider]  diltiazem (CARDIZEM) 30 MG tablet Take 1 tablet (30 mg total) by mouth as needed (Rapid Palpitations). 01/31/18   Tereso Newcomer T, PA-C  fish oil-omega-3 fatty acids 1000 MG capsule Take 2 g by mouth in the morning and at bedtime.    [provider]  fluticasone (FLONASE) 50 MCG/ACT nasal spray Place 2 sprays into both nostrils daily as needed for allergies or rhinitis.  03/31/15   [provider]  hydroxypropyl methylcellulose / hypromellose (ISOPTO TEARS / GONIOVISC) 2.5 % ophthalmic solution Place 1 drop into both eyes 3 (three) times daily as needed for dry eyes.    [provider]  isosorbide mononitrate (IMDUR) 120 MG 24 hr tablet Take 120 mg  by mouth every morning.    [provider]  olmesartan (BENICAR) 20 MG tablet Take 20 mg by mouth daily. 03/13/20   [provider]  olmesartan (BENICAR) 40 MG tablet Take 40 mg by mouth daily. 08/22/20   [provider]  Study - [name] - inclisiran 300 mg/1.645mL or placebo SQ injection (PI-Stuckey) Inject 300 mg into the skin every 6 (six) months. 05/03/19   [provider]  triamcinolone cream (KENALOG) 0.5 % Apply 1 application topically as needed for irritation. 08/24/17   [provider]     Allergies:    Allergies  Allergen Reactions  . Bempedoic Acid Other (See Comments)    NEXLETOL Myalgias, GI upset  . [name] [Alirocumab] Other (See Comments)    Muscle weakness and rash  . Rosuvastatin Other (See Comments)    Low energy/leg and hip pain  . Zetia [Ezetimibe] Other (See Comments)    myalgias    Social History:   Social History   Socioeconomic History  . Marital status: Divorced    Spouse name: Not on file  . Number of children: 2  . Years of education: Not on file  . Highest education level: Not on file  Occupational History  . Occupation: retired  Tobacco Use   . Smoking status: Never Smoker  . Smokeless tobacco: Never Used  Vaping Use  . Vaping Use: Never used  Substance and Sexual Activity  . Alcohol use: No  . Drug use: No  . Sexual activity: Not on file  Other Topics Concern  . Not on file  Social History Narrative  . Not on file   Social Determinants of Health   Financial Resource Strain: Not on file  Food Insecurity: Not on file  Transportation Needs: Not on file  Physical Activity: Not on file  Stress: Not on file  Social Connections: Not on file  Intimate Partner Violence: Not on file    Family History:   The patient's family history includes Heart attack in his father and mother. There is no history of Stroke.    ROS:  Please see the history of present illness.  All other ROS reviewed and negative.     Physical Exam/Data:   Vitals:   10/26/20 1930 10/26/20 2000 10/26/20 2030 10/26/20 2045  BP: (!) 156/77 (!) 145/79 (!) 158/81 139/75  Pulse: (!) 58 (!) 57 60 (!) 58  Resp: 15 15 16  (!) 23  Temp:      TempSrc:      SpO2: 96% 95% 96% 95%  Weight:      Height:       No intake or output data in the 24 hours ending 10/26/20 2056 Last 3 Weights 10/26/2020 08/27/2020 05/27/2020  Weight (lbs) 214 lb 222 lb 217 lb  Weight (kg) 97.07 kg 100.699 kg 98.431 kg     Body mass index is 27.48 kg/m.  General:  Well nourished, well developed, in no acute distress HEENT: normal Lymph: no adenopathy Neck: no JVD Endocrine:  No thryomegaly Vascular: No carotid bruits; FA pulses 2+ bilaterally without bruits  Cardiac:  normal S1, S2; RRR; no murmur  Lungs:  clear to auscultation bilaterally, no wheezing, rhonchi or rales  Abd: soft, nontender, no hepatomegaly  Ext: no edema Musculoskeletal:  No deformities, BUE and BLE strength normal and equal Skin: warm and dry  Neuro:  CNs 2-12 intact, no focal abnormalities noted Psych:  Normal affect    EKG:  The ECG that was done  was personally reviewed and demonstrates sinus rhythm  with ? Old septal infarct.  Relevant CV Studies: Cath report in system. Cath 2020: Conclusions: 1. Multivessel coronary artery disease, not significantly changed compared with prior catheterization on 05/18/2019. 2. Patent LAD, LCx, and RCA stents.  The LAD stent(s) demonstrate moderate in-stent restenosis. 3. Normal left ventricular filling pressure.   Laboratory Data:  High Sensitivity Troponin:   Recent Labs  Lab 10/26/20 1848  TROPONINIHS 15      Chemistry Recent Labs  Lab 10/26/20 1848  NA 137  K 4.2  CL 106  CO2 24  GLUCOSE 252*  BUN 23  CREATININE 1.14  CALCIUM 9.3  GFRNONAA >60  ANIONGAP 7    Recent Labs  Lab 10/26/20 1848  PROT 6.0*  ALBUMIN 3.6  AST 26  ALT 25  ALKPHOS 56  BILITOT 0.6   Hematology Recent Labs  Lab 10/26/20 1848  WBC 8.0  RBC 4.19*  HGB 13.4  HCT 37.8*  MCV 90.2  MCH 32.0  MCHC 35.4  RDW 12.2  PLT 247   BNPNo results for input(s): BNP, PROBNP in the last 168 hours.  DDimer No results for input(s): DDIMER in the last 168 hours.   Radiology/Studies:  DG Chest 2 View  Result Date: 10/26/2020 CLINICAL DATA:  Acute chest pain EXAM: CHEST - 2 VIEW COMPARISON:  04/17/2020 FINDINGS: The cardiomediastinal silhouette is unremarkable. Mild LEFT basilar atelectasis/scarring again noted. There is no evidence of focal airspace disease, pulmonary edema, suspicious pulmonary nodule/mass, pleural effusion, or pneumothorax. No acute bony abnormalities are identified. IMPRESSION: Mild LEFT basilar atelectasis/scarring. Electronically Signed   By: Harmon Pier M.D.   On: 10/26/2020 18:32     Assessment and Plan:   1. Unstable Angina:Patient has worsening of the chest pain that is occurring at rest suggesting UA. Will DC apixaban and start on Heparin and ACS protocol. Will keep NPO after midnight for probable cath in am. 2. Hypertension: Will continue CCB and ARB. Fair control. 3. Diabetes Mellitus: Will continue home meds. 4. PAF: In  sinus rhythm. Will DC apixaban as pt is on full dose heparin.   Risk Assessment/Risk Scores:        :[id number]}   TIMI Risk Score for Unstable Angina or Non-ST Elevation MI:   The patient's TIMI risk score is 5, which indicates a 26% risk of all cause mortality, new or recurrent myocardial infarction or need for urgent revascularization in the next 14 days.{  Severity of Illness: The appropriate patient status for this patient is INPATIENT. Inpatient status is judged to be reasonable and necessary in order to provide the required intensity of service to ensure the patient's safety. The patient's presenting symptoms, physical exam findings, and initial radiographic and laboratory data in the context of their chronic comorbidities is felt to place them at high risk for further clinical deterioration. Furthermore, it is not anticipated that the patient will be medically stable for discharge from the hospital within 2 midnights of admission. The following factors support the patient status of inpatient.   " The patient's presenting symptoms include Chest pain suggestive of unstable angina. " The chronic co-morbidities include CAD, HTN and DM.   * I certify that at the point of admission it is my clinical judgment that the patient will require inpatient hospital care spanning beyond 2 midnights from the point of admission due to high intensity of service, high risk for further deterioration and high frequency of surveillance required.*    For questions  or updates, please contact [facility] Please consult [email or web address] for contact info under     Signed, Alona Bene, MD  10/26/2020 8:56 PM

## 2020-10-26 NOTE — ED Triage Notes (Signed)
Pt came in via [facility] from an urgent care. Pt c/o chest pain.   BP 162/75 RR 16 Temp 97.9 Pulse 66

## 2020-10-27 ENCOUNTER — Encounter (HOSPITAL_COMMUNITY): Payer: Self-pay | Admitting: Internal Medicine

## 2020-10-27 ENCOUNTER — Other Ambulatory Visit (HOSPITAL_COMMUNITY): Payer: Self-pay

## 2020-10-27 ENCOUNTER — Encounter (HOSPITAL_COMMUNITY)
Admission: EM | Disposition: A | Discharge status: Home / Self Care | Payer: Self-pay | Source: Home / Self Care | Attending: Internal Medicine

## 2020-10-27 DIAGNOSIS — E1159 Type 2 diabetes mellitus with other circulatory complications: Secondary | ICD-10-CM

## 2020-10-27 DIAGNOSIS — I480 Paroxysmal atrial fibrillation: Secondary | ICD-10-CM

## 2020-10-27 DIAGNOSIS — I25110 Atherosclerotic heart disease of native coronary artery with unstable angina pectoris: Secondary | ICD-10-CM | POA: Diagnosis not present

## 2020-10-27 DIAGNOSIS — I200 Unstable angina: Secondary | ICD-10-CM

## 2020-10-27 HISTORY — PX: LEFT HEART CATH AND CORONARY ANGIOGRAPHY: CATH118249

## 2020-10-27 LAB — BASIC METABOLIC PANEL
Anion gap: 7 (ref 5–15)
BUN: 21 mg/dL (ref 8–23)
CO2: 24 mmol/L (ref 22–32)
Calcium: 10 mg/dL (ref 8.9–10.3)
Chloride: 106 mmol/L (ref 98–111)
Creatinine, Ser: 1.26 mg/dL — ABNORMAL HIGH (ref 0.61–1.24)
GFR, Estimated: 57 mL/min — ABNORMAL LOW (ref >60)
Glucose, Bld: 177 mg/dL — ABNORMAL HIGH (ref 70–99)
Potassium: 4.5 mmol/L (ref 3.5–5.1)
Sodium: 137 mmol/L (ref 135–145)

## 2020-10-27 LAB — GLUCOSE, CAPILLARY
Glucose-Capillary: 153 mg/dL — ABNORMAL HIGH (ref 70–99)
Glucose-Capillary: 206 mg/dL — ABNORMAL HIGH (ref 70–99)
Glucose-Capillary: 225 mg/dL — ABNORMAL HIGH (ref 70–99)
Glucose-Capillary: 93 mg/dL (ref 70–99)

## 2020-10-27 LAB — CBC
HCT: 38.2 % — ABNORMAL LOW (ref 39.0–52.0)
Hemoglobin: 13.4 g/dL (ref 13.0–17.0)
MCH: 31.8 pg (ref 26.0–34.0)
MCHC: 35.1 g/dL (ref 30.0–36.0)
MCV: 90.7 fL (ref 80.0–100.0)
Platelets: 227 10*3/uL (ref 150–400)
RBC: 4.21 MIL/uL — ABNORMAL LOW (ref 4.22–5.81)
RDW: 12.3 % (ref 11.5–15.5)
WBC: 8.2 10*3/uL (ref 4.0–10.5)
nRBC: 0 % (ref 0.0–0.2)

## 2020-10-27 LAB — PROTIME-INR
INR: 1.1 (ref 0.8–1.2)
Prothrombin Time: 14.1 seconds (ref 11.4–15.2)

## 2020-10-27 LAB — APTT: aPTT: 47 seconds — ABNORMAL HIGH (ref 24–36)

## 2020-10-27 LAB — HEPARIN LEVEL (UNFRACTIONATED): Heparin Unfractionated: 1.56 IU/mL — ABNORMAL HIGH (ref 0.30–0.70)

## 2020-10-27 SURGERY — LEFT HEART CATH AND CORONARY ANGIOGRAPHY
Anesthesia: LOCAL

## 2020-10-27 MED ORDER — FENTANYL CITRATE (PF) 100 MCG/2ML IJ SOLN
INTRAMUSCULAR | Status: AC
  Filled 2020-10-27: qty 2

## 2020-10-27 MED ORDER — MIDAZOLAM HCL 2 MG/2ML IJ SOLN
INTRAMUSCULAR | Status: DC | PRN
  Administered 2020-10-27: 2 mg via INTRAVENOUS

## 2020-10-27 MED ORDER — VERAPAMIL HCL 2.5 MG/ML IV SOLN
INTRAVENOUS | Status: DC | PRN
  Administered 2020-10-27: 10 mL via INTRA_ARTERIAL

## 2020-10-27 MED ORDER — LIDOCAINE HCL (PF) 1 % IJ SOLN
INTRAMUSCULAR | Status: AC
  Filled 2020-10-27: qty 30

## 2020-10-27 MED ORDER — MIDAZOLAM HCL 2 MG/2ML IJ SOLN
INTRAMUSCULAR | Status: AC
  Filled 2020-10-27: qty 2

## 2020-10-27 MED ORDER — SODIUM CHLORIDE 0.9 % IV SOLN
250.0000 mL | INTRAVENOUS | Status: DC | PRN
Start: 2020-10-27 — End: 2020-10-28

## 2020-10-27 MED ORDER — SODIUM CHLORIDE 0.9 % WEIGHT BASED INFUSION
3.0000 mL/kg/h | INTRAVENOUS | Status: DC
  Administered 2020-10-27: 3 mL/kg/h via INTRAVENOUS

## 2020-10-27 MED ORDER — DIAZEPAM 5 MG PO TABS: 5.0000 mg | ORAL_TABLET | Freq: Four times a day (QID) | ORAL | Status: DC | PRN

## 2020-10-27 MED ORDER — APIXABAN 5 MG PO TABS
5.0000 mg | ORAL_TABLET | Freq: Two times a day (BID) | ORAL | Status: DC
  Administered 2020-10-28: 5 mg via ORAL
  Filled 2020-10-27: qty 1

## 2020-10-27 MED ORDER — SODIUM CHLORIDE 0.9 % IV SOLN: 250.0000 mL | INTRAVENOUS | Status: DC | PRN

## 2020-10-27 MED ORDER — CARVEDILOL 6.25 MG PO TABS
6.2500 mg | ORAL_TABLET | Freq: Two times a day (BID) | ORAL | Status: DC
  Administered 2020-10-27 – 2020-10-28 (×2): 6.25 mg via ORAL
  Filled 2020-10-27 (×2): qty 1

## 2020-10-27 MED ORDER — FENTANYL CITRATE (PF) 100 MCG/2ML IJ SOLN
INTRAMUSCULAR | Status: DC | PRN
  Administered 2020-10-27: 25 ug via INTRAVENOUS

## 2020-10-27 MED ORDER — HEPARIN SODIUM (PORCINE) 1000 UNIT/ML IJ SOLN
INTRAMUSCULAR | Status: DC | PRN
  Administered 2020-10-27: 5000 [IU] via INTRAVENOUS

## 2020-10-27 MED ORDER — SODIUM CHLORIDE 0.9% FLUSH
3.0000 mL | Freq: Two times a day (BID) | INTRAVENOUS | Status: DC
  Administered 2020-10-28: 3 mL via INTRAVENOUS

## 2020-10-27 MED ORDER — IOHEXOL 350 MG/ML SOLN
INTRAVENOUS | Status: DC | PRN
  Administered 2020-10-27: 80 mL

## 2020-10-27 MED ORDER — SODIUM CHLORIDE 0.9% FLUSH: 3.0000 mL | INTRAVENOUS | Status: DC | PRN

## 2020-10-27 MED ORDER — SODIUM CHLORIDE 0.9 % WEIGHT BASED INFUSION
1.0000 mL/kg/h | INTRAVENOUS | Status: DC
  Administered 2020-10-27: 1 mL/kg/h via INTRAVENOUS

## 2020-10-27 MED ORDER — VERAPAMIL HCL 2.5 MG/ML IV SOLN
INTRAVENOUS | Status: AC
  Filled 2020-10-27: qty 2

## 2020-10-27 MED ORDER — HYDRALAZINE HCL 20 MG/ML IJ SOLN: 10.0000 mg | INTRAMUSCULAR | Status: AC | PRN

## 2020-10-27 MED ORDER — HEPARIN (PORCINE) IN NACL 1000-0.9 UT/500ML-% IV SOLN
INTRAVENOUS | Status: DC | PRN
  Administered 2020-10-27 (×2): 500 mL

## 2020-10-27 MED ORDER — HEPARIN (PORCINE) IN NACL 1000-0.9 UT/500ML-% IV SOLN
INTRAVENOUS | Status: AC
  Filled 2020-10-27: qty 1000

## 2020-10-27 MED ORDER — LABETALOL HCL 5 MG/ML IV SOLN: 10.0000 mg | INTRAVENOUS | Status: AC | PRN

## 2020-10-27 MED ORDER — LIDOCAINE HCL (PF) 1 % IJ SOLN
INTRAMUSCULAR | Status: DC | PRN
  Administered 2020-10-27: 2 mL

## 2020-10-27 MED ORDER — ACETAMINOPHEN 325 MG PO TABS: 650.0000 mg | ORAL_TABLET | ORAL | Status: DC | PRN

## 2020-10-27 MED ORDER — SODIUM CHLORIDE 0.9 % IV SOLN: INTRAVENOUS | Status: DC

## 2020-10-27 MED ORDER — ASPIRIN 81 MG PO CHEW
81.0000 mg | CHEWABLE_TABLET | ORAL | Status: AC
  Administered 2020-10-27: 81 mg via ORAL
  Filled 2020-10-27: qty 1

## 2020-10-27 MED ORDER — HEPARIN SODIUM (PORCINE) 1000 UNIT/ML IJ SOLN
INTRAMUSCULAR | Status: AC
  Filled 2020-10-27: qty 1

## 2020-10-27 MED ORDER — SODIUM CHLORIDE 0.9% FLUSH: 3.0000 mL | Freq: Two times a day (BID) | INTRAVENOUS | Status: DC

## 2020-10-27 MED ORDER — ONDANSETRON HCL 4 MG/2ML IJ SOLN: 4.0000 mg | Freq: Four times a day (QID) | INTRAMUSCULAR | Status: DC | PRN

## 2020-10-27 SURGICAL SUPPLY — 12 items
CATH INFINITI JR4 5F (CATHETERS) ×2 IMPLANT
CATH OPTITORQUE TIG 4.0 5F (CATHETERS) ×2 IMPLANT
DEVICE RAD COMP TR BAND LRG (VASCULAR PRODUCTS) ×2 IMPLANT
GLIDESHEATH SLEND SS 6F .021 (SHEATH) ×2 IMPLANT
GUIDEWIRE INQWIRE 1.5J.035X260 (WIRE) ×1 IMPLANT
INQWIRE 1.5J .035X260CM (WIRE) ×2
KIT HEART LEFT (KITS) ×2 IMPLANT
PACK CARDIAC CATHETERIZATION (CUSTOM PROCEDURE TRAY) ×2 IMPLANT
SHEATH PROBE COVER 6X72 (BAG) ×2 IMPLANT
SYR MEDRAD MARK 7 150ML (SYRINGE) ×2 IMPLANT
TRANSDUCER W/STOPCOCK (MISCELLANEOUS) ×2 IMPLANT
TUBING CIL FLEX 10 FLL-RA (TUBING) ×2 IMPLANT

## 2020-10-27 NOTE — H&P (View-Only) (Signed)
Progress Note  [name] Date of Encounter: 10/27/2020  [facility] Cardiologist: Kristeen Miss, MD   Subjective   Overall feeling better this morning.  Took his last dose of Eliquis at 7:30 AM yesterday morning.  Previously was feeling a dull substernal chest discomfort mild radiation to the left.  His daughter gave him some nitroglycerin without much relief previously.  He did state that isosorbide seemed to help previously with his chest discomfort.  Also on amlodipine as well.  Inpatient Medications    Scheduled Meds: . amLODipine  10 mg Oral Daily  . carvedilol  3.125 mg Oral BID  . cholecalciferol  1,000 Units Oral Daily  . clopidogrel  75 mg Oral Daily  . glipiZIDE  10 mg Oral BID WC  . insulin glargine  28 Units Subcutaneous Daily  . irbesartan  300 mg Oral Daily  . isosorbide mononitrate  120 mg Oral q morning  . metFORMIN  500 mg Oral Q breakfast  . omega-3 acid ethyl esters  2 g Oral Daily   Continuous Infusions: . heparin 1,550 Units/hr (10/27/20 0727)   PRN Meds: acetaminophen, diltiazem, fluticasone, nitroGLYCERIN, ondansetron (ZOFRAN) IV   Vital Signs    Vitals:   10/26/20 2350 10/27/20 0121 10/27/20 0127 10/27/20 0659  BP: (!) 166/85 (!) 153/72 (!) 155/79 (!) 145/73  Pulse: 70  (!) 57 (!) 53  Resp: 18   18  Temp: 97.9 F (36.6 C)   97.8 F (36.6 C)  TempSrc: Oral   Oral  SpO2: 100% 93% 92% 96%  Weight:      Height:        Intake/Output Summary (Last 24 hours) at 10/27/2020 0749 Last data filed at 10/27/2020 0432 Gross per 24 hour  Intake 77.52 ml  Output --  Net 77.52 ml   Last 3 Weights 10/26/2020 10/26/2020 08/27/2020  Weight (lbs) 218 lb 8 oz 214 lb 222 lb  Weight (kg) 99.111 kg 97.07 kg 100.699 kg      Telemetry    SB no adverse arrhythmias- Personally Reviewed  ECG    Sinus rhythm left axis deviation poor R wave progression noted nonspecific ST-T wave changes- Personally Reviewed  Physical Exam   GEN: No  acute distress.   Neck: No JVD Cardiac: RRR, no murmurs, rubs, or gallops.  Respiratory: Clear to auscultation bilaterally. GI: Soft, nontender, non-distended  MS: No edema; No deformity. Neuro:  Nonfocal  Psych: Normal affect   Labs    High Sensitivity Troponin:   Recent Labs  Lab 10/26/20 1848 10/26/20 2038  TROPONINIHS 15 15      Chemistry Recent Labs  Lab 10/26/20 1848 10/27/20 0536  NA 137 137  K 4.2 4.5  CL 106 106  CO2 24 24  GLUCOSE 252* 177*  BUN 23 21  CREATININE 1.14 1.26*  CALCIUM 9.3 10.0  PROT 6.0*  --   ALBUMIN 3.6  --   AST 26  --   ALT 25  --   ALKPHOS 56  --   BILITOT 0.6  --   GFRNONAA >60 57*  ANIONGAP 7 7     Hematology Recent Labs  Lab 10/26/20 1848 10/27/20 0536  WBC 8.0 8.2  RBC 4.19* 4.21*  HGB 13.4 13.4  HCT 37.8* 38.2*  MCV 90.2 90.7  MCH 32.0 31.8  MCHC 35.4 35.1  RDW 12.2 12.3  PLT 247 227    BNPNo results for input(s): BNP, PROBNP in the last 168 hours.  DDimer No results for input(s): DDIMER in the last 168 hours.   Radiology    DG Chest 2 View  Result Date: 10/26/2020 CLINICAL DATA:  Acute chest pain EXAM: CHEST - 2 VIEW COMPARISON:  04/17/2020 FINDINGS: The cardiomediastinal silhouette is unremarkable. Mild LEFT basilar atelectasis/scarring again noted. There is no evidence of focal airspace disease, pulmonary edema, suspicious pulmonary nodule/mass, pleural effusion, or pneumothorax. No acute bony abnormalities are identified. IMPRESSION: Mild LEFT basilar atelectasis/scarring. Electronically Signed   By: Jeffrey  Hu M.D.   On: 10/26/2020 18:32    Cardiac Studies   Cath 2020: Conclusions: 1. Multivessel coronary artery disease, not significantly changed compared with prior catheterization on 05/18/2019. 2. Patent LAD, LCx, and RCA stents. The LAD stent(s) demonstrate moderate in-stent restenosis. 3. Normal left ventricular filling pressure.  Diagnostic Dominance: Co-dominant    Patient Profile      80 y.o. male here with unstable angina.  Has known CAD multivessel last catheterization 2020 treated medically.  Assessment & Plan    Unstable angina -Eliquis has been held, IV heparin currently being utilized. -Pain described as constant throughout the day yesterday improved with nitroglycerin for a few minutes only to recur.  Prior heart attack felt more like a burning sensation. 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 -On Plavix.  Eliquis currently on hold, last dose 4/10 at 7:30 in the morning.  Has paroxysmal atrial fibrillation. -7 prior stents. -Await cardiac catheterization.  This will be done later on this afternoon.  Statin intolerance -Used to cause him jaw pain he states, currently in [facility] for study, inclisiran injection once every 6 months.  Chronic anticoagulation -On apixaban.  Currently on hold. -IV heparin in place.  Paroxysmal atrial fibrillation -On diltiazem  Diabetes with hypertension -On home medications. -ARB -Holding Metformin  History of stroke -Small CVA seen on head CT incidental finding in 2013.  Carotid Dopplers in 2013 showed minimal disease.  Spoke to daughter on phone.  Answered questions.  For questions or updates, please contact [facility] Please consult [email or web address] for contact info under        Signed, Nykeria Mealing, MD  10/27/2020, 7:49 AM    

## 2020-10-27 NOTE — Interval H&P Note (Signed)
Cath Lab Visit (complete for each Cath Lab visit)  Clinical Evaluation Leading to the Procedure:   ACS: No.  Non-ACS:    Anginal Classification: CCS IV  Anti-ischemic medical therapy: Maximal Therapy (2 or more classes of medications)  Non-Invasive Test Results: No non-invasive testing performed  Prior CABG: No previous CABG      History and Physical Interval Note:  10/27/2020 3:09 PM  Joseph [name]  has presented today for surgery, with the diagnosis of nstemi.  The various methods of treatment have been discussed with the patient and family. After consideration of risks, benefits and other options for treatment, the patient has consented to  Procedure(s): LEFT HEART CATH AND CORONARY ANGIOGRAPHY (N/A) as a surgical intervention.  The patient's history has been reviewed, patient examined, no change in status, stable for surgery.  I have reviewed the patient's chart and labs.  Questions were answered to the patient's satisfaction.     Nicki Guadalajara

## 2020-10-27 NOTE — TOC Benefit Eligibility Note (Signed)
Patient Product/process development scientist completed.    The patient is currently admitted and upon discharge could be taking Farxiga 10 mg.  The current 30 day co-pay is, $47.00.   The patient is currently admitted and upon discharge could be taking Jardiance 10 mg.  The current 30 day co-pay is, $47.00.   The patient is insured through [employer] Medicare Part D    Roland Earl, CPhT Pharmacy Patient Advocate Specialist [facility] Antimicrobial Stewardship Team Direct Number: [phone]  Fax: [phone]

## 2020-10-27 NOTE — Progress Notes (Signed)
ANTICOAGULATION CONSULT NOTE  Pharmacy Consult for heparin Indication: chest pain/ACS  Allergies  Allergen Reactions  . Bempedoic Acid Other (See Comments)    NEXLETOL Myalgias, GI upset  . [name] [Alirocumab] Other (See Comments)    Muscle weakness and rash  . Rosuvastatin Other (See Comments)    Low energy/leg and hip pain  . Zetia [Ezetimibe] Other (See Comments)    myalgias    Patient Measurements: Height: 6\' 2"  (188 cm) Weight: 99.1 kg (218 lb 8 oz) IBW/kg (Calculated) : 82.2 Heparin Dosing Weight: 97.1kg  Vital Signs: Temp: 97.8 F (36.6 C) (04/11 0659) Temp Source: Oral (04/11 0659) BP: 145/73 (04/11 0659) Pulse Rate: 53 (04/11 0659)  Labs: Recent Labs    10/26/20 1848 10/26/20 2038 10/27/20 0536  HGB 13.4  --  13.4  HCT 37.8*  --  38.2*  PLT 247  --  227  APTT  --   --  47*  LABPROT  --   --  14.1  INR  --   --  1.1  CREATININE 1.14  --  1.26*  TROPONINIHS 15 15  --     Estimated Creatinine Clearance: 56.9 mL/min (A) (by C-G formula based on SCr of 1.26 mg/dL (H)).   Medical History: Past Medical History:  Diagnosis Date  . Abdominal discomfort    Abdominal discomfort: EGD 2011 showed no significant abnormalities.  Possibly due to metformin.  . Allergic rhinitis   . Anticoagulated 05/30/2016  . CAD (coronary artery disease)    a. s/p MI in 2004:  stents to the LAD and staged stent RCA, EF 55%;  b.  NSTEMI (1/16):  LHC - mid LAD stent with diff dist restenosis, prox to mid Dx mod dsz jailed by stent, small OM1 99, mCFX 75, dCFX 80, mRCA stent ok, mPDA mild to mod dsz, EF 55% >> PCI:  Synergy DES to LAD; Synergy DES (x2) mid and dist CFX  . CAD in native artery 06/12/2019  . 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  s/p back surgery  . DM (diabetes mellitus) (HCC)   . History of CVA (cerebrovascular accident)    Small CVA seen by head CT (incidental) in 1/13.  Carotid dopplers in 1/13  showed minimal disease  . History of Doppler ultrasound    Arterial dopplers (3/11): no evidence for significant PAD. ABIs 10/12: Normal.    . HLD (hyperlipidemia)   . HTN (hypertension)    had side effects with chlorthalidone  . Hx of cardiovascular stress test    Myoview (1/13): Small fixed apical septal defect with no ischemia, EF 58%.  03-18-1995 Hx of echocardiogram    Echo (3/11): EF 60%, normal wall motion, mild MR, mild left atrial enlargement, mildly dilated ascending aorta.  11-24-1976 PAF (paroxysmal atrial fibrillation) (HCC) 05/29/2016  . Spondylolisthesis    L4-S1    Medications:  Infusions:  . heparin 1,250 Units/hr (10/26/20 2218)    Assessment: 80 yom presented to the ED with CP on IV heparin. Pt is on apixaban PTA for history of afib.  Plans noted for possible cardiac cath -aPTT= 47 -Hg= 13.4    Goal of Therapy:  Heparin level 0.3-0.7 units/ml aPTT 66-102 seconds Monitor platelets by anticoagulation protocol: Yes   Plan:  -Increase heparin to 1550 units/hr -Will follow plans post cath  2219, PharmD Clinical Pharmacist **Pharmacist phone directory can now be found on [email or web address] ([email or web address]).  Listed under [facility]  Pharmacy.

## 2020-10-27 NOTE — Progress Notes (Signed)
Inpatient Diabetes Program Recommendations  AACE/ADA: New Consensus Statement on Inpatient Glycemic Control (2015)  Target Ranges:  Prepandial:   less than 140 mg/dL      Peak postprandial:   less than 180 mg/dL (1-2 hours)      Critically ill patients:  140 - 180 mg/dL   Lab Results  Component Value Date   GLUCAP 206 (H) 10/27/2020   HGBA1C 8.8 (H) 11/11/2019    Review of Glycemic Control  Diabetes history: DM 2 Outpatient Diabetes medications: Tresiba 28 units, Glipizide 10 mg bid, Metformin 500 mg Daily Current orders for Inpatient glycemic control:  Lantus 28 units Daily Glipizide 10 mg bid  Inpatient Diabetes Program Recommendations:    -  Add Novolog 0-9 units tid + hs -  D/c glipizide as it causes hypoglycemia often inpatient  Thanks,  Christena Deem RN, MSN, BC-ADM Inpatient Diabetes Coordinator Team Pager [phone] (8a-5p)

## 2020-10-27 NOTE — Progress Notes (Signed)
Progress Note  [name] Date of Encounter: 10/27/2020  [facility] Cardiologist: Kristeen Miss, MD   Subjective   Overall feeling better this morning.  Took his last dose of Eliquis at 7:30 AM yesterday morning.  Previously was feeling a dull substernal chest discomfort mild radiation to the left.  His daughter gave him some nitroglycerin without much relief previously.  He did state that isosorbide seemed to help previously with his chest discomfort.  Also on amlodipine as well.  Inpatient Medications    Scheduled Meds: . amLODipine  10 mg Oral Daily  . carvedilol  3.125 mg Oral BID  . cholecalciferol  1,000 Units Oral Daily  . clopidogrel  75 mg Oral Daily  . glipiZIDE  10 mg Oral BID WC  . insulin glargine  28 Units Subcutaneous Daily  . irbesartan  300 mg Oral Daily  . isosorbide mononitrate  120 mg Oral q morning  . metFORMIN  500 mg Oral Q breakfast  . omega-3 acid ethyl esters  2 g Oral Daily   Continuous Infusions: . heparin 1,550 Units/hr (10/27/20 0727)   PRN Meds: acetaminophen, diltiazem, fluticasone, nitroGLYCERIN, ondansetron (ZOFRAN) IV   Vital Signs    Vitals:   10/26/20 2350 10/27/20 0121 10/27/20 0127 10/27/20 0659  BP: (!) 166/85 (!) 153/72 (!) 155/79 (!) 145/73  Pulse: 70  (!) 57 (!) 53  Resp: 18   18  Temp: 97.9 F (36.6 C)   97.8 F (36.6 C)  TempSrc: Oral   Oral  SpO2: 100% 93% 92% 96%  Weight:      Height:        Intake/Output Summary (Last 24 hours) at 10/27/2020 0749 Last data filed at 10/27/2020 0432 Gross per 24 hour  Intake 77.52 ml  Output --  Net 77.52 ml   Last 3 Weights 10/26/2020 10/26/2020 08/27/2020  Weight (lbs) 218 lb 8 oz 214 lb 222 lb  Weight (kg) 99.111 kg 97.07 kg 100.699 kg      Telemetry    SB no adverse arrhythmias- Personally Reviewed  ECG    Sinus rhythm left axis deviation poor R wave progression noted nonspecific ST-T wave changes- Personally Reviewed  Physical Exam   GEN: No  acute distress.   Neck: No JVD Cardiac: RRR, no murmurs, rubs, or gallops.  Respiratory: Clear to auscultation bilaterally. GI: Soft, nontender, non-distended  MS: No edema; No deformity. Neuro:  Nonfocal  Psych: Normal affect   Labs    High Sensitivity Troponin:   Recent Labs  Lab 10/26/20 1848 10/26/20 2038  TROPONINIHS 15 15      Chemistry Recent Labs  Lab 10/26/20 1848 10/27/20 0536  NA 137 137  K 4.2 4.5  CL 106 106  CO2 24 24  GLUCOSE 252* 177*  BUN 23 21  CREATININE 1.14 1.26*  CALCIUM 9.3 10.0  PROT 6.0*  --   ALBUMIN 3.6  --   AST 26  --   ALT 25  --   ALKPHOS 56  --   BILITOT 0.6  --   GFRNONAA >60 57*  ANIONGAP 7 7     Hematology Recent Labs  Lab 10/26/20 1848 10/27/20 0536  WBC 8.0 8.2  RBC 4.19* 4.21*  HGB 13.4 13.4  HCT 37.8* 38.2*  MCV 90.2 90.7  MCH 32.0 31.8  MCHC 35.4 35.1  RDW 12.2 12.3  PLT 247 227    BNPNo results for input(s): BNP, PROBNP in the last 168 hours.  DDimer No results for input(s): DDIMER in the last 168 hours.   Radiology    DG Chest 2 View  Result Date: 10/26/2020 CLINICAL DATA:  Acute chest pain EXAM: CHEST - 2 VIEW COMPARISON:  04/17/2020 FINDINGS: The cardiomediastinal silhouette is unremarkable. Mild LEFT basilar atelectasis/scarring again noted. There is no evidence of focal airspace disease, pulmonary edema, suspicious pulmonary nodule/mass, pleural effusion, or pneumothorax. No acute bony abnormalities are identified. IMPRESSION: Mild LEFT basilar atelectasis/scarring. Electronically Signed   By: Harmon Pier M.D.   On: 10/26/2020 18:32    Cardiac Studies   Cath 2020: Conclusions: 1. Multivessel coronary artery disease, not significantly changed compared with prior catheterization on 05/18/2019. 2. Patent LAD, LCx, and RCA stents. The LAD stent(s) demonstrate moderate in-stent restenosis. 3. Normal left ventricular filling pressure.  Diagnostic Dominance: Co-dominant    Patient Profile      80 y.o. male here with unstable angina.  Has known CAD multivessel last catheterization 2020 treated medically.  Assessment & Plan    Unstable angina -Eliquis has been held, IV heparin currently being utilized. -Pain described as constant throughout the day yesterday improved with nitroglycerin for a few minutes only to recur.  Prior heart attack felt more like a burning sensation. 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 -On Plavix.  Eliquis currently on hold, last dose 4/10 at 7:30 in the morning.  Has paroxysmal atrial fibrillation. -7 prior stents. -Await cardiac catheterization.  This will be done later on this afternoon.  Statin intolerance -Used to cause him jaw pain he states, currently in [place] for study, inclisiran injection once every 6 months.  Chronic anticoagulation -On apixaban.  Currently on hold. -IV heparin in place.  Paroxysmal atrial fibrillation -On diltiazem  Diabetes with hypertension -On home medications. -ARB -Holding Metformin  History of stroke -Small CVA seen on head CT incidental finding in 2013.  Carotid Dopplers in 2013 showed minimal disease.  Spoke to daughter on phone.  Answered questions.  For questions or updates, please contact [facility] Please consult [email or web address] for contact info under        Signed, Donato Schultz, MD  10/27/2020, 7:49 AM

## 2020-10-27 NOTE — Plan of Care (Signed)

## 2020-10-28 ENCOUNTER — Encounter (HOSPITAL_COMMUNITY): Payer: Self-pay | Admitting: Cardiovascular Disease

## 2020-10-28 ENCOUNTER — Other Ambulatory Visit: Payer: Medicare Other

## 2020-10-28 ENCOUNTER — Inpatient Hospital Stay (HOSPITAL_COMMUNITY): Payer: Medicare Other

## 2020-10-28 DIAGNOSIS — I2510 Atherosclerotic heart disease of native coronary artery without angina pectoris: Secondary | ICD-10-CM | POA: Diagnosis not present

## 2020-10-28 DIAGNOSIS — I200 Unstable angina: Secondary | ICD-10-CM | POA: Diagnosis not present

## 2020-10-28 LAB — ECHOCARDIOGRAM COMPLETE
AR max vel: 3.83 cm2
AV Area VTI: 3.97 cm2
AV Area mean vel: 4.25 cm2
AV Mean grad: 3 mmHg
AV Peak grad: 6.9 mmHg
Ao pk vel: 1.31 m/s
Area-P 1/2: 2.39 cm2
Calc EF: 59 %
Height: 74 in
MV VTI: 3.16 cm2
S' Lateral: 2.7 cm
Single Plane A2C EF: 51.2 %
Single Plane A4C EF: 66 %
Weight: 3448 oz

## 2020-10-28 LAB — CBC
HCT: 37.4 % — ABNORMAL LOW (ref 39.0–52.0)
Hemoglobin: 13.2 g/dL (ref 13.0–17.0)
MCH: 31.8 pg (ref 26.0–34.0)
MCHC: 35.3 g/dL (ref 30.0–36.0)
MCV: 90.1 fL (ref 80.0–100.0)
Platelets: 240 10*3/uL (ref 150–400)
RBC: 4.15 MIL/uL — ABNORMAL LOW (ref 4.22–5.81)
RDW: 12.3 % (ref 11.5–15.5)
WBC: 8.4 10*3/uL (ref 4.0–10.5)
nRBC: 0 % (ref 0.0–0.2)

## 2020-10-28 LAB — GLUCOSE, CAPILLARY
Glucose-Capillary: 123 mg/dL — ABNORMAL HIGH (ref 70–99)
Glucose-Capillary: 149 mg/dL — ABNORMAL HIGH (ref 70–99)

## 2020-10-28 LAB — APTT: aPTT: 28 seconds (ref 24–36)

## 2020-10-28 MED ORDER — CARVEDILOL 6.25 MG PO TABS: 6.2500 mg | ORAL_TABLET | Freq: Two times a day (BID) | ORAL | 6 refills | Status: DC

## 2020-10-28 NOTE — Discharge Instructions (Signed)
Angina  Angina is discomfort or pain in the chest, neck, arm, jaw, or back. The discomfort is caused by a lack of blood in the middle layer of the heart wall. The middle layer of the heart wall is called the myocardium. What are the causes? This condition is caused by a buildup of fat and cholesterol, or plaque, in your arteries. This buildup narrows the arteries and makes it hard for blood to flow. What increases the risk? Main risks  High levels of cholesterol in your blood.  High blood pressure.  Diabetes.  Family history of heart disease.  Not exercising or moving enough.  Depression.  Having had radiation treatment on the left side of your chest. Other risks  Tobacco use.  Too much body weight (obesity).  A diet that is high in unhealthy fats (saturated fats).  Stress.  Using drugs, such as cocaine. Risks for women  Being older than 55 years.  Being in menopause. This is the time when a woman no longer has a menstrual period. What are the signs or symptoms? Symptoms in all people  Chest pain, which may: ? Feel like a crushing or squeezing in the chest. ? Feel like a tightness, pressure, fullness, or heaviness in the chest. ? Last for more than a few minutes at a time. ? Stop and come back (recur) after a few minutes.  Pain in the neck, arm, jaw, or back.  Heartburn or upset stomach (indigestion) for no reason.  Being short of breath.  Feeling like you may vomit (nauseous).  Sudden cold sweats. Symptoms in women and people with diabetes  Tiredness.  Worry and anxiety.  Weakness.  Dizziness or fainting. How is this treated? This condition may be treated with:  Medicines. These can be given to prevent blood clots, stop a heart attack, lower blood pressure, or treat other risk factors.  A procedure to widen a narrowed or blocked artery in the heart.  Surgery to allow blood to go around a blocked artery. Follow these instructions at  home: Medicines  Take over-the-counter and prescription medicines only as told by your doctor.  Do not take these medicines unless your doctor says that you can: ? NSAIDs. These include:  Ibuprofen.  Naproxen. ? Vitamin supplements that have vitamin A, vitamin E, or both. ? Hormone therapy that contains estrogen with or without progestin. Eating and drinking  Eat a heart-healthy diet that includes: ? Lots of fresh fruits and vegetables. ? Whole grains. ? Low-fat (lean) protein. ? Low-fat dairy products.  Follow instructions from your doctor about what you cannot eat or drink.   Activity  Follow an exercise program that your doctor tells you.  Talk with your doctor about joining a program to make your heart strong again (cardiac rehab).  When you feel tired, take a break. Plan breaks if you know you are going to feel tired. Lifestyle  Do not smoke or use any products that contain nicotine or tobacco. If you need help quitting, ask your doctor.  If your doctor says you can drink alcohol: ? Limit how much you have to:  0-1 drink a day for women who are not pregnant.  0-2 drinks a day for men. ? Know how much alcohol is in your drink. In the [place] one drink equals one 12 oz bottle of beer (355 mL), one 5 oz glass of wine (148 mL), or one 1 oz glass of hard liquor (44 mL).   General instructions  Stay at   a healthy weight. If told to lose weight, work with your doctor to do lose weight safely.  Learn to manage stress. If you need help, ask your doctor.  Keep your vaccines up to date. Get a flu shot every year.  Talk with your doctor if you feel sad. Take a screening test to see if you are at risk for depression.  Work with your doctor to manage any other health problems that you have. These may include diabetes or high blood pressure.  Keep all follow-up visits. Get help right away if:  You have pain in your chest, neck, arm, jaw, or back, and the pain: ? Lasts more  than a few minutes. ? Comes back. ? Does not get better after you take medicine under your tongue (sublingual nitroglycerin). ? Keeps getting worse. ? Comes more often.  You have any of these problems: ? Sweating a lot. ? Heartburn or upset stomach. ? Shortness of breath. ? Trouble breathing. ? Feeling like you may vomit. ? Vomiting. ? Feeling more tired than normal. ? Feeling nervous or worrying more than normal. ? Weakness.  You are dizzy or light-headed all of a sudden.  You faint. These symptoms may be an emergency. Get help right away. Call your local emergency services (911 in the [place]  Do not wait to see if the symptoms will go away.  Do not drive yourself to the hospital. Summary  Angina is discomfort or pain in the chest, neck, arm, neck, or back.  Symptoms include chest pain, heartburn or upset stomach, and shortness of breath.  Women or people with diabetes may have other symptoms, such as feeling nervous, being worried, or being weak or tired.  Take all medicines only as told by your doctor.  You should eat a heart-healthy diet and follow an exercise program. This information is not intended to replace advice given to you by your health care provider. Make sure you discuss any questions you have with your health care provider. Document Revised: 12/28/2019 Document Reviewed: 12/28/2019 Elsevier Patient Education  2021 [employer]vier Inc.  

## 2020-10-28 NOTE — Plan of Care (Signed)

## 2020-10-28 NOTE — Progress Notes (Signed)
Progress Note  [name] Date of Encounter: 10/28/2020  [facility] Cardiologist: Kristeen Miss, MD   Subjective   Feeling better.  Glad he got checked out.  Currently getting echocardiogram.  No further chest pain  Inpatient Medications    Scheduled Meds: . amLODipine  10 mg Oral Daily  . apixaban  5 mg Oral BID  . carvedilol  6.25 mg Oral BID  . cholecalciferol  1,000 Units Oral Daily  . clopidogrel  75 mg Oral Daily  . glipiZIDE  10 mg Oral BID WC  . insulin glargine  28 Units Subcutaneous Daily  . irbesartan  300 mg Oral Daily  . isosorbide mononitrate  120 mg Oral q morning  . omega-3 acid ethyl esters  2 g Oral Daily  . sodium chloride flush  3 mL Intravenous Q12H   Continuous Infusions: . sodium chloride Stopped (10/27/20 2300)  . sodium chloride     PRN Meds: sodium chloride, acetaminophen, diazepam, diltiazem, fluticasone, nitroGLYCERIN, ondansetron (ZOFRAN) IV, sodium chloride flush   Vital Signs    Vitals:   10/27/20 2032 10/28/20 0513 10/28/20 0528 10/28/20 1007  BP: 135/72 132/75  (!) 158/68  Pulse: 65 (!) 51  61  Resp: 18 18  18   Temp: 98.2 F (36.8 C) 97.6 F (36.4 C)  97.7 F (36.5 C)  TempSrc: Oral Oral  Oral  SpO2: 93% 97%  97%  Weight:   97.8 kg   Height:        Intake/Output Summary (Last 24 hours) at 10/28/2020 1024 Last data filed at 10/28/2020 1000 Gross per 24 hour  Intake 3181.2 ml  Output 1400 ml  Net 1781.2 ml   Last 3 Weights 10/28/2020 10/26/2020 10/26/2020  Weight (lbs) 215 lb 8 oz 218 lb 8 oz 214 lb  Weight (kg) 97.75 kg 99.111 kg 97.07 kg      Telemetry    Sinus rhythm, no adverse arrhythmias- Personally Reviewed  ECG    Sinus rhythm- Personally Reviewed  Physical Exam   GEN: No acute distress.   Neck: No JVD Cardiac: RRR, no murmurs, rubs, or gallops.  Cath site normal Respiratory: Clear to auscultation bilaterally. GI: Soft, nontender, non-distended  MS: No edema; No deformity. Neuro:   Nonfocal  Psych: Normal affect   Labs    High Sensitivity Troponin:   Recent Labs  Lab 10/26/20 1848 10/26/20 2038  TROPONINIHS 15 15      Chemistry Recent Labs  Lab 10/26/20 1848 10/27/20 0536  NA 137 137  K 4.2 4.5  CL 106 106  CO2 24 24  GLUCOSE 252* 177*  BUN 23 21  CREATININE 1.14 1.26*  CALCIUM 9.3 10.0  PROT 6.0*  --   ALBUMIN 3.6  --   AST 26  --   ALT 25  --   ALKPHOS 56  --   BILITOT 0.6  --   GFRNONAA >60 57*  ANIONGAP 7 7     Hematology Recent Labs  Lab 10/26/20 1848 10/27/20 0536 10/28/20 0438  WBC 8.0 8.2 8.4  RBC 4.19* 4.21* 4.15*  HGB 13.4 13.4 13.2  HCT 37.8* 38.2* 37.4*  MCV 90.2 90.7 90.1  MCH 32.0 31.8 31.8  MCHC 35.4 35.1 35.3  RDW 12.2 12.3 12.3  PLT 247 227 240    BNPNo results for input(s): BNP, PROBNP in the last 168 hours.   DDimer No results for input(s): DDIMER in the last 168 hours.   Radiology    DG  Chest 2 View  Result Date: 10/26/2020 CLINICAL DATA:  Acute chest pain EXAM: CHEST - 2 VIEW COMPARISON:  04/17/2020 FINDINGS: The cardiomediastinal silhouette is unremarkable. Mild LEFT basilar atelectasis/scarring again noted. There is no evidence of focal airspace disease, pulmonary edema, suspicious pulmonary nodule/mass, pleural effusion, or pneumothorax. No acute bony abnormalities are identified. IMPRESSION: Mild LEFT basilar atelectasis/scarring. Electronically Signed   By: Harmon Pier M.D.   On: 10/26/2020 18:32   CARDIAC CATHETERIZATION  Result Date: 10/28/2020  3rd Diag lesion is 25% stenosed.  2nd Diag lesion is 80% stenosed.  Prox LAD to Mid LAD lesion is 40% stenosed.  Mid LAD to Dist LAD lesion is 30% stenosed with 70% stenosed side branch in 2nd Diag.  2nd Mrg lesion is 90% stenosed.  Non-stenotic RPDA lesion was previously treated.  Non-stenotic Dist RCA lesion with 0% stenosed side branch in RPDA was previously treated.  Prox RCA to Mid RCA lesion is 5% stenosed.  Ramus lesion is 100% stenosed.  Dist  LM to Ost LAD lesion is 50% stenosed.  Previously placed Prox Cx to Mid Cx stent (unknown type) is widely patent.  Previously placed LPAV stent (unknown type) is widely patent.  Mid RCA to Dist RCA lesion is 35% stenosed.  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  LV end diastolic pressure is low.  The present study does not appear significantly changed from the last procedure in November 2020.  There is significant coronary calcification.  However, on the AP cranial view it appears that there is a smooth 50% distal left main stenosis which is not appreciated significantly on the other views or on the prior catheterizations. There is no change in the diffuse diagonal stenosis of the LAD with probable 30% in-stent restenosis of the LAD stent. The circumflex stents are patent with no change in the small caliber obtuse marginal vessel with previously noted 90% stenosis. The RCA is calcified with luminal irregularity and 30% smooth narrowing in the region of the acute margin with widely patent distal stent extending into the mid large PDA vessel. Mild to moderate LV dysfunction with hypokinesis involving the mid distal anterolateral wall extending to the apex with EF estimated 40%.  LVEDP 7 mmHg. RECOMMENDATION: Increased  medical therapy trial.  We will titrate carvedilol to 6.25 mg twice daily.  Consider possible addition of ranolazine if recurrent symptomatology.  The patient is participating in the [place] 4 trial with inclisiran.     Cardiac Studies   Cardiac catheterization 10/27/2020:   3rd Diag lesion is 25% stenosed.  2nd Diag lesion is 80% stenosed.  Prox LAD to Mid LAD lesion is 40% stenosed.  Mid LAD to Dist LAD lesion is 30% stenosed with 70% stenosed side branch in 2nd Diag.  2nd Mrg lesion is 90% stenosed.  Non-stenotic RPDA lesion was previously treated.  Non-stenotic Dist RCA lesion with 0% stenosed side branch in RPDA was previously treated.  Prox RCA to  Mid RCA lesion is 5% stenosed.  Ramus lesion is 100% stenosed.  Dist LM to Ost LAD lesion is 50% stenosed.  Previously placed Prox Cx to Mid Cx stent (unknown type) is widely patent.  Previously placed LPAV stent (unknown type) is widely patent.  Mid RCA to Dist RCA lesion is 35% stenosed.  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  LV end diastolic pressure is low.   The present study does not appear significantly changed from the last procedure in November 2020.  There  is significant coronary calcification.  However, on the AP cranial view it appears that there is a smooth 50% distal left main stenosis which is not appreciated significantly on the other views or on the prior catheterizations.  There is no change in the diffuse diagonal stenosis of the LAD with probable 30% in-stent restenosis of the LAD stent.  The circumflex stents are patent with no change in the small caliber obtuse marginal vessel with previously noted 90% stenosis.  The RCA is calcified with luminal irregularity and 30% smooth narrowing in the region of the acute margin with widely patent distal stent extending into the mid large PDA vessel.  Mild to moderate LV dysfunction with hypokinesis involving the mid distal anterolateral wall extending to the apex with EF estimated 40%.  LVEDP 7 mmHg.  RECOMMENDATION: Increased  medical therapy trial.  We will titrate carvedilol to 6.25 mg twice daily.  Consider possible addition of ranolazine if recurrent symptomatology.  The patient is participating in the [place] 4 trial with inclisiran.    Diagnostic Dominance: Co-dominant      Patient Profile     80 y.o. male admitted with unstable angina with known multivessel coronary artery disease underwent cardiac catheterization.  Assessment & Plan    Unstable angina Multivessel coronary artery disease -Burning-like sensation, has been battling stable angina for quite some time.  7  prior stents.  On Plavix.  Eliquis was on hold for cardiac catheterization.  He is now back on Eliquis. -Cardiac catheterization as described above, no significant change from prior study.  No new PCI. -Carvedilol was increased to 6.25 mg twice a day. -EF was noted to be approximately 40% with mid to distal anterolateral wall dysfunction. -Continuing isosorbide 120 mg a day. -Irbesartan 300 mg a day. -Plavix 75 a day. -Amlodipine 10 a day  Statin intolerance -Used to because of jaw pain.  [place] study, inclisarin.  Has injection every 6 months.  Paroxysmal atrial fibrillation -Currently on carvedilol.  Chronic anticoagulation -On Eliquis.  Diabetes with hypertension -Restart home Metformin, ARB  History of stroke -Small CVA seen on head CT incidental finding in 2013.  Carotid Dopplers in 2013 showed minimal disease.  Wife in room.  After echocardiogram is performed (currently getting ECHO) and he receives lunch and is able to ambulate without difficulty, I am comfortable with his discharge home with follow-up, Dr. Elease Hashimoto or APP team in 2 to 4 weeks if possible.     For questions or updates, please contact [facility] Please consult [email or web address] for contact info under        Signed, Donato Schultz, MD  10/28/2020, 10:24 AM

## 2020-10-28 NOTE — Discharge Summary (Addendum)
Discharge Summary    Patient ID: Joseph [name] MRN: [id number]; DOB: 1940-08-22  Admit date: 10/26/2020 Discharge date: 10/28/2020  PCP:  Dema Severin, NP   [facility]  Cardiologist:  Kristeen Miss, MD  Advanced Practice Provider:  No care team member to display Electrophysiologist:  None   Discharge Diagnoses    Principal Problem:   Unstable angina [facility]) Active Problems:   Type 2 diabetes mellitus with vascular disease (HCC)   Essential hypertension   Paroxysmal atrial fibrillation (HCC)   CAD in native artery    Diagnostic Studies/Procedures    Cardiac Cath 10/27/20  3rd Diag lesion is 25% stenosed.  2nd Diag lesion is 80% stenosed.  Prox LAD to Mid LAD lesion is 40% stenosed.  Mid LAD to Dist LAD lesion is 30% stenosed with 70% stenosed side branch in 2nd Diag.  2nd Mrg lesion is 90% stenosed.  Non-stenotic RPDA lesion was previously treated.  Non-stenotic Dist RCA lesion with 0% stenosed side branch in RPDA was previously treated.  Prox RCA to Mid RCA lesion is 5% stenosed.  Ramus lesion is 100% stenosed.  Dist LM to Ost LAD lesion is 50% stenosed.  Previously placed Prox Cx to Mid Cx stent (unknown type) is widely patent.  Previously placed LPAV stent (unknown type) is widely patent.  Mid RCA to Dist RCA lesion is 35% stenosed.  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  LV end diastolic pressure is low.   The present study does not appear significantly changed from the last procedure in November 2020.  There is significant coronary calcification.  However, on the AP cranial view it appears that there is a smooth 50% distal left main stenosis which is not appreciated significantly on the other views or on the prior catheterizations.  There is no change in the diffuse diagonal stenosis of the LAD with probable 30% in-stent restenosis of the LAD stent.  The circumflex stents are patent with no  change in the small caliber obtuse marginal vessel with previously noted 90% stenosis.  The RCA is calcified with luminal irregularity and 30% smooth narrowing in the region of the acute margin with widely patent distal stent extending into the mid large PDA vessel.  Mild to moderate LV dysfunction with hypokinesis involving the mid distal anterolateral wall extending to the apex with EF estimated 40%.  LVEDP 7 mmHg.  RECOMMENDATION: Increased  medical therapy trial.  We will titrate carvedilol to 6.25 mg twice daily.  Consider possible addition of ranolazine if recurrent symptomatology.  The patient is participating in the [place] 4 trial with inclisiran.    2D Echo 10/28/20 1. Left ventricular ejection fraction, by estimation, is 60 to 65%. The  left ventricle has normal function. The left ventricle has no regional  wall motion abnormalities. Left ventricular diastolic parameters are  indeterminate.  2. Right ventricular systolic function is normal. The right ventricular  size is normal.  3. The mitral valve is grossly normal. Trivial mitral valve  regurgitation.  4. The aortic valve is tricuspid. Aortic valve regurgitation is not  visualized. No aortic stenosis is present.     _____________   History of Present Illness     Joseph [name] is a 80 y.o. 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 His chest pain began around 7:30am the day of admission, described as a substernal aching/heartburn sensation. It was constant throughout the day, improved with NTG only to recur a few minutes later. He denied any shortness of breath, nausea, diaphoresis, radiation of chest pain, newlower extremity swelling. He has been having symptoms of stable angina for some time now which is  mostly with exertion and relieves quickly, however this was the first day it occurred at rest and persisted. Due to continued symptoms he came to the ED (transferred from urgent care) for evaluation. CXR showed mild left basilar atelectasis/scarring. Troponins were negative. Symptoms were felt concerning for unstable angina so he was admitted for further management.  Hospital Course     1. Unstable angina/multivessel CAD - His Eliquis was held and he was put on heparin per pharmacy. He underwent cardiac cath with findings above, no significant change from prior study, no new PCI. Carvedilol was titrated to 6.25mg  BID. He was continued on home doses of Plavix, amlodipine, and Imdur. (Clarified with MD plan to continue previous dose of Imdur which is dosed 120mg  QAM, 30mg  QPM - patient confirmed this is how he is taking it as well.) LV gram by cath showed mild to moderate LV dysfunction with hypokinesis involving the mid distal anterolateral wall extending to the apex with EF estimated 40%. LVEDP was . Follow-up echocardiogram today showed EF 60-65%, no RWMA, normal RV function, trivial MR.  2. HLD with statin intolerance - he is in the [place] study, inclisarin. Has injection every 6 months.  3. Paroxysmal atrial fib - he has been maintaining NSR. He will continue carvedilol. Eliquis was restarted today.  4. DM - resume home regimen except hold Metformin 48 hours post-cath, to restart 10/30/20. Personally discussed with patient and added FYI to AVS.  5. CKD stage II-III - prior Cr was 1.2-1.4 in 2021, admitted at 1.14. This was 1.26 pre-cath. Dr. Anne Fu recommends Dr. Elease Hashimoto obtain a BMET at time of follow-up appointment in 2 weeks. I added this to the appointment notes.  The patient feels much better today and ambulated without difficulty. Dr. Anne Fu has seen and examined the patient today and feels he is stable for discharge. Instructions reviewed with patient and follow-up arranged.  Did the  patient have an acute coronary syndrome (MI, NSTEMI, STEMI, etc) this admission?:  No                               Did the patient have a percutaneous coronary intervention (stent / angioplasty)?:  No.       _____________  Discharge Vitals Blood pressure (!) 158/68, pulse 61, temperature 97.7 F (36.5 C), temperature source Oral, resp. rate 18, height 6\' 2"  (1.88 m), weight 97.8 kg, SpO2 97 %.  Filed Weights   10/26/20 1736 10/26/20 2339 10/28/20 0528  Weight: 97.1 kg 99.1 kg 97.8 kg    Labs & Radiologic Studies    CBC Recent Labs    10/26/20 1848 10/27/20 0536 10/28/20 0438  WBC 8.0 8.2 8.4  NEUTROABS 5.2  --   --   HGB 13.4 13.4 13.2  HCT 37.8* 38.2* 37.4*  MCV 90.2 90.7 90.1  PLT 247 227 240   Basic Metabolic Panel Recent Labs    16/10/96 1848 10/27/20 0536  NA 137 137  K 4.2 4.5  CL 106 106  CO2 24 24  GLUCOSE 252* 177*  BUN 23 21  CREATININE 1.14 1.26*  CALCIUM 9.3 10.0   Liver Function Tests Recent Labs    10/26/20 1848  AST 26  ALT 25  ALKPHOS 56  BILITOT 0.6  PROT 6.0*  ALBUMIN 3.6   No results for input(s): LIPASE, AMYLASE in the last 72 hours. High Sensitivity Troponin:   Recent Labs  Lab 10/26/20 1848 10/26/20 2038  TROPONINIHS 15 15    ____________  DG Chest 2 View  Result Date: 10/26/2020 CLINICAL DATA:  Acute chest pain EXAM: CHEST - 2 VIEW COMPARISON:  04/17/2020 FINDINGS: The cardiomediastinal silhouette is unremarkable. Mild LEFT basilar atelectasis/scarring again noted. There is no evidence of focal airspace disease, pulmonary edema, suspicious pulmonary nodule/mass, pleural effusion, or pneumothorax. No acute bony abnormalities are identified. IMPRESSION: Mild LEFT basilar atelectasis/scarring. Electronically Signed   By: Harmon Pier M.D.   On: 10/26/2020 18:32   CARDIAC CATHETERIZATION  Result Date: 10/28/2020  3rd Diag lesion is 25% stenosed.  2nd Diag lesion is 80% stenosed.  Prox LAD to Mid LAD lesion is 40% stenosed.   Mid LAD to Dist LAD lesion is 30% stenosed with 70% stenosed side branch in 2nd Diag.  2nd Mrg lesion is 90% stenosed.  Non-stenotic RPDA lesion was previously treated.  Non-stenotic Dist RCA lesion with 0% stenosed side branch in RPDA was previously treated.  Prox RCA to Mid RCA lesion is 5% stenosed.  Ramus lesion is 100% stenosed.  Dist LM to Ost LAD lesion is 50% stenosed.  Previously placed Prox Cx to Mid Cx stent (unknown type) is widely patent.  Previously placed LPAV stent (unknown type) is widely patent.  Mid RCA to Dist RCA lesion is 35% stenosed.  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  LV end diastolic pressure is low.  The present study does not appear significantly changed from the last procedure in November 2020.  There is significant coronary calcification.  However, on the AP cranial view it appears that there is a smooth 50% distal left main stenosis which is not appreciated significantly on the other views or on the prior catheterizations. There is no change in the diffuse diagonal stenosis of the LAD with probable 30% in-stent restenosis of the LAD stent. The circumflex stents are patent with no change in the small caliber obtuse marginal vessel with previously noted 90% stenosis. The RCA is calcified with luminal irregularity and 30% smooth narrowing in the region of the acute margin with widely patent distal stent extending into the mid large PDA vessel. Mild to moderate LV dysfunction with hypokinesis involving the mid distal anterolateral wall extending to the apex with EF estimated 40%.  LVEDP 7 mmHg. RECOMMENDATION: Increased  medical therapy trial.  We will titrate carvedilol to 6.25 mg twice daily.  Consider possible addition of ranolazine if recurrent symptomatology.  The patient is participating in the [place] 4 trial with inclisiran.    ECHOCARDIOGRAM COMPLETE  Result Date: 10/28/2020    ECHOCARDIOGRAM REPORT   Patient Name:   Joseph [name] Date  of Exam: 10/28/2020 Medical Rec #:  [id number]      Height:       74.0 in Accession #:    [id number]     Weight:       215.5 lb Date of Birth:  06-21-1941      BSA:          2.244 m Patient Age:    80 years       BP:  132/75 mmHg Patient Gender: M              HR:           57 bpm. Exam Location:  Inpatient Procedure: 2D Echo, Cardiac Doppler and Color Doppler Indications:    CAD  History:        Patient has prior history of Echocardiogram examinations, most                 recent 06/08/2019. CAD, Stroke, Arrythmias:Atrial Fibrillation;                 Risk Factors:Hypertension and Dyslipidemia.  Sonographer:    Neomia Dear RDCS Referring Phys: [phone] THOMAS A KELLY  Sonographer Comments: 06/11/19 cath Patient could not lay on left side due to sore shoulder. Images improved with breath coaching. IMPRESSIONS  1. Left ventricular ejection fraction, by estimation, is 60 to 65%. The left ventricle has normal function. The left ventricle has no regional wall motion abnormalities. Left ventricular diastolic parameters are indeterminate.  2. Right ventricular systolic function is normal. The right ventricular size is normal.  3. The mitral valve is grossly normal. Trivial mitral valve regurgitation.  4. The aortic valve is tricuspid. Aortic valve regurgitation is not visualized. No aortic stenosis is present. FINDINGS  Left Ventricle: Left ventricular ejection fraction, by estimation, is 60 to 65%. The left ventricle has normal function. The left ventricle has no regional wall motion abnormalities. The left ventricular internal cavity size was normal in size. There is  no left ventricular hypertrophy. Left ventricular diastolic parameters are indeterminate. Right Ventricle: The right ventricular size is normal. Right vetricular wall thickness was not well visualized. Right ventricular systolic function is normal. Left Atrium: Left atrial size was normal in size. Right Atrium: Right atrial size was normal in size.  Pericardium: There is no evidence of pericardial effusion. Mitral Valve: The mitral valve is grossly normal. Trivial mitral valve regurgitation. MV peak gradient, 4.3 mmHg. The mean mitral valve gradient is 1.0 mmHg. Tricuspid Valve: The tricuspid valve is grossly normal. Tricuspid valve regurgitation is trivial. Aortic Valve: The aortic valve is tricuspid. Aortic valve regurgitation is not visualized. No aortic stenosis is present. Aortic valve mean gradient measures 3.0 mmHg. Aortic valve peak gradient measures 6.9 mmHg. Aortic valve area, by VTI measures 3.97 cm. Pulmonic Valve: The pulmonic valve was normal in structure. Pulmonic valve regurgitation is trivial. Aorta: The aortic root and ascending aorta are structurally normal, with no evidence of dilitation. IAS/Shunts: The atrial septum is grossly normal.  LEFT VENTRICLE PLAX 2D LVIDd:         5.00 cm     Diastology LVIDs:         2.70 cm     LV e' medial:    7.83 cm/s LV PW:         1.00 cm     LV E/e' medial:  9.5 LV IVS:        1.20 cm     LV e' lateral:   8.38 cm/s LVOT diam:     2.60 cm     LV E/e' lateral: 8.9 LV SV:         111 LV SV Index:   50 LVOT Area:     5.31 cm  LV Volumes (MOD) LV vol d, MOD A2C: 71.3 ml LV vol d, MOD A4C: 97.8 ml LV vol s, MOD A2C: 34.8 ml LV vol s, MOD A4C: 33.3 ml LV SV MOD A2C:  36.5 ml LV SV MOD A4C:     97.8 ml LV SV MOD BP:      50.6 ml  PULMONARY VEINS A Reversal Duration: 106.00 msec A Reversal Velocity: 20.50 cm/s Diastolic Velocity:  25.70 cm/s S/D Velocity:        0.80 Systolic Velocity:   21.30 cm/s LEFT ATRIUM             Index       RIGHT ATRIUM           Index LA diam:        3.00 cm 1.34 cm/m  RA Area:     13.30 cm LA Vol (A2C):   42.8 ml 19.08 ml/m RA Volume:   24.00 ml  10.70 ml/m LA Vol (A4C):   51.5 ml 22.95 ml/m LA Biplane Vol: 47.7 ml 21.26 ml/m  AORTIC VALVE                   PULMONIC VALVE AV Area (Vmax):    3.83 cm    PV Vmax:       0.84 m/s AV Area (Vmean):   4.25 cm    PV Vmean:       60.200 cm/s AV Area (VTI):     3.97 cm    PV VTI:        0.200 m AV Vmax:           131.00 cm/s PV Peak grad:  2.8 mmHg AV Vmean:          84.400 cm/s PV Mean grad:  2.0 mmHg AV VTI:            0.281 m AV Peak Grad:      6.9 mmHg AV Mean Grad:      3.0 mmHg LVOT Vmax:         94.60 cm/s LVOT Vmean:        67.500 cm/s LVOT VTI:          0.210 m LVOT/AV VTI ratio: 0.75  AORTA Ao Root diam: 4.00 cm Ao Asc diam:  3.40 cm MITRAL VALVE MV Area (PHT): 2.39 cm    SHUNTS MV Area VTI:   3.16 cm    Systemic VTI:  0.21 m MV Peak grad:  4.3 mmHg    Systemic Diam: 2.60 cm MV Mean grad:  1.0 mmHg MV Vmax:       1.04 m/s MV Vmean:      51.4 cm/s MV Decel Time: 317 msec MV E velocity: 74.40 cm/s MV A velocity: 81.30 cm/s MV E/A ratio:  0.92 Kristeen Miss MD Electronically signed by Kristeen Miss MD Signature Date/Time: 10/28/2020/12:17:35 PM    Final    Disposition   Pt is being discharged home today in good condition.  Follow-up Plans & Appointments     Follow-up Information    Nahser, Deloris Ping, MD Follow up.   Specialty: Cardiology Why: A followup has been arranged for you on Tuesday Nov 11, 2020 at 11:40 AM (Arrive by 11:25 AM). Contact information: [address] [place] [address] [phone]              Discharge Instructions    Diet - low sodium heart healthy   Complete by: As directed    Discharge instructions   Complete by: As directed    IMPORTANT INFORMATION ABOUT YOUR METFORMIN: You need to stay off your metformin for at least 2 days after a heart cath. You can  restart this on 10/30/20.  Your carvedilol has been doubled (from 3.125mg  twice a day to 6.25mg  twice a day). A new prescription was sent in. If you would like to, you may use up your 3.125mg  tablets by taking 2 of them twice a day. When they run out, please fill the new prescription for the 6.25mg  tablets at which time you'll then start taking just 1 tablet twice a day.   Increase activity slowly   Complete by:  As directed    No driving for 2 days. (Do not return to driving if you have previously been instructed not to drive for other reasons.) No lifting over 5 lbs for 1 week. No sexual activity for 1 week. Keep procedure site clean & dry. If you notice increased pain, swelling, bleeding or pus, call/return!  You may shower, but no soaking baths/hot tubs/pools for 1 week.      Discharge Medications   Allergies as of 10/28/2020      Reactions   Bempedoic Acid Other (See Comments)   NEXLETOL Myalgias, GI upset   [name] [alirocumab] Other (See Comments)   Muscle weakness and rash   Rosuvastatin Other (See Comments)   Low energy/leg and hip pain   Zetia [ezetimibe] Other (See Comments)   myalgias   Keflex [cephalexin] Other (See Comments)   Reaction unk      Medication List    TAKE these medications   acetaminophen 500 MG tablet Commonly known as: TYLENOL Take 1,000 mg by mouth as needed for moderate pain.   amLODipine 10 MG tablet Commonly known as: NORVASC Take 10 mg by mouth daily.   apixaban 5 MG Tabs tablet Commonly known as: ELIQUIS Take 5 mg by mouth 2 (two) times daily.   carvedilol 6.25 MG tablet Commonly known as: COREG Take 1 tablet (6.25 mg total) by mouth 2 (two) times daily. What changed:   medication strength  how much to take   clopidogrel 75 MG tablet Commonly known as: PLAVIX Take 1 tablet (75 mg total) by mouth daily.   Coenzyme Q10 100 MG capsule Take 100 mg by mouth daily.   diltiazem 30 MG tablet Commonly known as: Cardizem Take 1 tablet (30 mg total) by mouth as needed (Rapid Palpitations).   fish oil-omega-3 fatty acids 1000 MG capsule Take 2 g by mouth in the morning and at bedtime.   fluticasone 50 MCG/ACT nasal spray Commonly known as: FLONASE Place 2 sprays into both nostrils daily as needed for allergies or rhinitis.   glipiZIDE 10 MG tablet Commonly known as: GLUCOTROL Take 10 mg by mouth 2 (two) times daily.   hydroxypropyl  methylcellulose / hypromellose 2.5 % ophthalmic solution Commonly known as: ISOPTO TEARS / GONIOVISC Place 1 drop into both eyes 3 (three) times daily as needed for dry eyes.   isosorbide mononitrate 30 MG 24 hr tablet Commonly known as: IMDUR Take 30 mg by mouth every evening.   isosorbide mononitrate 120 MG 24 hr tablet Commonly known as: IMDUR Take 120 mg by mouth every morning.   metFORMIN 500 MG 24 hr tablet Commonly known as: GLUCOPHAGE-XR Take 500 mg by mouth daily. Notes to patient: You need to stay off your metformin for at least 2 days after a heart cath. You can restart this on 10/30/20.   nitroGLYCERIN 0.4 MG SL tablet Commonly known as: NITROSTAT Place 1 tablet (0.4 mg total) under the tongue every 5 (five) minutes as needed for chest pain (x 3 doses).   olmesartan  40 MG tablet Commonly known as: BENICAR Take 40 mg by mouth daily.   [name] inclisiran or placebo 300 mg/1.5 mL Inj SQ injection Inject 300 mg into the skin every 6 (six) months.   Evaristo Buryresiba FlexTouch 100 UNIT/ML FlexTouch Pen Generic drug: insulin degludec Inject 30-34 Units into the skin daily.   triamcinolone cream 0.5 % Commonly known as: KENALOG Apply 1 application topically as needed for irritation.   Vitamin D3 25 MCG (1000 UT) Caps Take 1,000 Units by mouth daily.          Outstanding Labs/Studies   Recommend BMET at time of follow-up  Duration of Discharge Encounter   Greater than 30 minutes including physician time.  Signed, Laurann Montanaayna N Dunn, PA-C 10/28/2020, 1:25 PM  Personally seen and examined. Agree with above.    Subjective   Feeling better.  Glad he got checked out.  Currently getting echocardiogram.  No further chest pain  Inpatient Medications    Scheduled Meds: . amLODipine  10 mg Oral Daily  . apixaban  5 mg Oral BID  . carvedilol  6.25 mg Oral BID  . cholecalciferol  1,000 Units Oral Daily  . clopidogrel  75 mg Oral Daily  . glipiZIDE  10 mg Oral BID WC  .  insulin glargine  28 Units Subcutaneous Daily  . irbesartan  300 mg Oral Daily  . isosorbide mononitrate  120 mg Oral q morning  . omega-3 acid ethyl esters  2 g Oral Daily  . sodium chloride flush  3 mL Intravenous Q12H   Continuous Infusions: . sodium chloride Stopped (10/27/20 2300)  . sodium chloride     PRN Meds: sodium chloride, acetaminophen, diazepam, diltiazem, fluticasone, nitroGLYCERIN, ondansetron (ZOFRAN) IV, sodium chloride flush   Vital Signs          Vitals:   10/27/20 2032 10/28/20 0513 10/28/20 0528 10/28/20 1007  BP: 135/72 132/75  (!) 158/68  Pulse: 65 (!) 51  61  Resp: 18 18  18   Temp: 98.2 F (36.8 C) 97.6 F (36.4 C)  97.7 F (36.5 C)  TempSrc: Oral Oral  Oral  SpO2: 93% 97%  97%  Weight:   97.8 kg   Height:        Intake/Output Summary (Last 24 hours) at 10/28/2020 1024 Last data filed at 10/28/2020 1000    Gross per 24 hour  Intake 3181.2 ml  Output 1400 ml  Net 1781.2 ml   Last 3 Weights 10/28/2020 10/26/2020 10/26/2020  Weight (lbs) 215 lb 8 oz 218 lb 8 oz 214 lb  Weight (kg) 97.75 kg 99.111 kg 97.07 kg      Telemetry    Sinus rhythm, no adverse arrhythmias- Personally Reviewed  ECG    Sinus rhythm- Personally Reviewed  Physical Exam   GEN:No acute distress.   Neck:No JVD Cardiac:RRR, no murmurs, rubs, or gallops.  Cath site normal Respiratory:Clear to auscultation bilaterally. ZO:XWRUGI:Soft, nontender, non-distended  MS:No edema; No deformity. Neuro:Nonfocal  Psych: Normal affect   Labs    High Sensitivity Troponin:   Last Labs       Recent Labs  Lab 10/26/20 1848 10/26/20 2038  TROPONINIHS 15 15        Chemistry Last Labs       Recent Labs  Lab 10/26/20 1848 10/27/20 0536  NA 137 137  K 4.2 4.5  CL 106 106  CO2 24 24  GLUCOSE 252* 177*  BUN 23 21  CREATININE 1.14 1.26*  CALCIUM 9.3 10.0  PROT 6.0*  --   ALBUMIN 3.6  --   AST 26  --   ALT 25  --   ALKPHOS 56  --   BILITOT  0.6  --   GFRNONAA >60 57*  ANIONGAP 7 7       Hematology Last Labs        Recent Labs  Lab 10/26/20 1848 10/27/20 0536 10/28/20 0438  WBC 8.0 8.2 8.4  RBC 4.19* 4.21* 4.15*  HGB 13.4 13.4 13.2  HCT 37.8* 38.2* 37.4*  MCV 90.2 90.7 90.1  MCH 32.0 31.8 31.8  MCHC 35.4 35.1 35.3  RDW 12.2 12.3 12.3  PLT 247 227 240      BNP Last Labs   No results for input(s): BNP, PROBNP in the last 168 hours.     DDimer  Last Labs   No results for input(s): DDIMER in the last 168 hours.     Radiology     Imaging Results (Last 48 hours)  DG Chest 2 View  Result Date: 10/26/2020 CLINICAL DATA:  Acute chest pain EXAM: CHEST - 2 VIEW COMPARISON:  04/17/2020 FINDINGS: The cardiomediastinal silhouette is unremarkable. Mild LEFT basilar atelectasis/scarring again noted. There is no evidence of focal airspace disease, pulmonary edema, suspicious pulmonary nodule/mass, pleural effusion, or pneumothorax. No acute bony abnormalities are identified. IMPRESSION: Mild LEFT basilar atelectasis/scarring. Electronically Signed   By: Harmon Pier M.D.   On: 10/26/2020 18:32   CARDIAC CATHETERIZATION  Result Date: 10/28/2020  3rd Diag lesion is 25% stenosed.  2nd Diag lesion is 80% stenosed.  Prox LAD to Mid LAD lesion is 40% stenosed.  Mid LAD to Dist LAD lesion is 30% stenosed with 70% stenosed side branch in 2nd Diag.  2nd Mrg lesion is 90% stenosed.  Non-stenotic RPDA lesion was previously treated.  Non-stenotic Dist RCA lesion with 0% stenosed side branch in RPDA was previously treated.  Prox RCA to Mid RCA lesion is 5% stenosed.  Ramus lesion is 100% stenosed.  Dist LM to Ost LAD lesion is 50% stenosed.  Previously placed Prox Cx to Mid Cx stent (unknown type) is widely patent.  Previously placed LPAV stent (unknown type) is widely patent.  Mid RCA to Dist RCA lesion is 35% stenosed.  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  LV end diastolic pressure is  low.  The present study does not appear significantly changed from the last procedure in November 2020.  There is significant coronary calcification.  However, on the AP cranial view it appears that there is a smooth 50% distal left main stenosis which is not appreciated significantly on the other views or on the prior catheterizations. There is no change in the diffuse diagonal stenosis of the LAD with probable 30% in-stent restenosis of the LAD stent. The circumflex stents are patent with no change in the small caliber obtuse marginal vessel with previously noted 90% stenosis. The RCA is calcified with luminal irregularity and 30% smooth narrowing in the region of the acute margin with widely patent distal stent extending into the mid large PDA vessel. Mild to moderate LV dysfunction with hypokinesis involving the mid distal anterolateral wall extending to the apex with EF estimated 40%.  LVEDP 7 mmHg. RECOMMENDATION: Increased  medical therapy trial.  We will titrate carvedilol to 6.25 mg twice daily.  Consider possible addition of ranolazine if recurrent symptomatology.  The patient is participating in the [place] 4 trial with inclisiran.      Cardiac Studies  Cardiac catheterization 10/27/2020:   3rd Diag lesion is 25% stenosed.  2nd Diag lesion is 80% stenosed.  Prox LAD to Mid LAD lesion is 40% stenosed.  Mid LAD to Dist LAD lesion is 30% stenosed with 70% stenosed side branch in 2nd Diag.  2nd Mrg lesion is 90% stenosed.  Non-stenotic RPDA lesion was previously treated.  Non-stenotic Dist RCA lesion with 0% stenosed side branch in RPDA was previously treated.  Prox RCA to Mid RCA lesion is 5% stenosed.  Ramus lesion is 100% stenosed.  Dist LM to Ost LAD lesion is 50% stenosed.  Previously placed Prox Cx to Mid Cx stent (unknown type) is widely patent.  Previously placed LPAV stent (unknown type) is widely patent.  Mid RCA to Dist RCA lesion is 35% stenosed.  There is mild  to moderate left ventricular systolic dysfunction.  LV end diastolic pressure is low.  The present study does not appear significantly changed from the last procedure in November 2020. There is significant coronary calcification. However, on the AP cranial view it appears that there is a smooth 50% distal left main stenosis which is not appreciated significantly on the other views or on the prior catheterizations.  There is no change in the diffuse diagonal stenosis of the LAD with probable 30% in-stent restenosis of the LAD stent.  The circumflex stents are patent with no change in the small caliber obtuse marginal vessel with previously noted 90% stenosis.  The RCA is calcified with luminal irregularity and 30% smooth narrowing in the region of the acute margin with widely patent distal stent extending into the mid large PDA vessel.  Mild to moderate LV dysfunction with hypokinesis involving the mid distal anterolateral wall extending to the apex with EF estimated 40%. LVEDP 7 mmHg.  RECOMMENDATION: Increased medical therapy trial. We will titrate carvedilol to 6.25 mg twice daily. Consider possible addition of ranolazine if recurrent symptomatology. The patient is participating in the [place] 4 trial with inclisiran.   Diagnostic Dominance: Co-dominant      Patient Profile     80 y.o. male admitted with unstable angina with known multivessel coronary artery disease underwent cardiac catheterization.  Assessment & Plan    Unstable angina Multivessel coronary artery disease -Burning-like sensation, has been battling stable angina for quite some time.  7 prior stents.  On Plavix.  Eliquis was on hold for cardiac catheterization.  He is now back on Eliquis. -Cardiac catheterization as described above, no significant change from prior study.  No new PCI. -Carvedilol was increased to 6.25 mg twice a day. -EF was noted to be approximately 40% with mid to distal  anterolateral wall dysfunction. -Continuing isosorbide 120 mg a day. -Irbesartan 300 mg a day. -Plavix 75 a day. -Amlodipine 10 a day  Statin intolerance -Used to because of jaw pain.  [place] study, inclisarin.  Has injection every 6 months.  Paroxysmal atrial fibrillation -Currently on carvedilol.  Chronic anticoagulation -On Eliquis.  Diabetes with hypertension -Restart home Metformin, ARB  History of stroke -Small CVA seen on head CT incidental finding in 2013.  Carotid Dopplers in 2013 showed minimal disease.  Wife in room.  After echocardiogram is performed (currently getting ECHO) and he receives lunch and is able to ambulate without difficulty, I am comfortable with his discharge home with follow-up, Dr. Elease Hashimoto or APP team in 2 to 4 weeks if possible.     For questions or updates, please contact [facility] Please consult [email or web address] for contact info under  Signed, Donato Schultz, MD

## 2020-10-30 MED ORDER — ELIQUIS 5 MG PO TABS: 5.0000 mg | ORAL_TABLET | Freq: Two times a day (BID) | ORAL | 1 refills | Status: DC

## 2020-10-30 NOTE — Addendum Note (Signed)
Addended by: Feliz Herard E on: 10/30/2020 09:42 AM   Modules accepted: Orders

## 2020-10-30 NOTE — Telephone Encounter (Signed)
SCr has stayed stable < 1.5 - hospitalized recently, refill sent in and pt aware.

## 2020-11-10 ENCOUNTER — Ambulatory Visit: Payer: Medicare Other | Admitting: Cardiovascular Disease

## 2020-11-11 ENCOUNTER — Other Ambulatory Visit: Payer: Self-pay

## 2020-11-11 ENCOUNTER — Ambulatory Visit (INDEPENDENT_AMBULATORY_CARE_PROVIDER_SITE_OTHER): Payer: Medicare Other | Admitting: Cardiovascular Disease

## 2020-11-11 ENCOUNTER — Encounter: Payer: Self-pay | Admitting: Cardiovascular Disease

## 2020-11-11 VITALS — BP 141/71 | HR 60 | Ht 74.0 in | Wt 222.0 lb

## 2020-11-11 DIAGNOSIS — I10 Essential (primary) hypertension: Secondary | ICD-10-CM

## 2020-11-11 DIAGNOSIS — I2510 Atherosclerotic heart disease of native coronary artery without angina pectoris: Secondary | ICD-10-CM | POA: Diagnosis not present

## 2020-11-11 DIAGNOSIS — I200 Unstable angina: Secondary | ICD-10-CM

## 2020-11-11 LAB — BASIC METABOLIC PANEL
BUN/Creatinine Ratio: 21 (ref 10–24)
BUN: 27 mg/dL (ref 8–27)
CO2: 24 mmol/L (ref 20–29)
Calcium: 10.3 mg/dL — ABNORMAL HIGH (ref 8.6–10.2)
Chloride: 100 mmol/L (ref 96–106)
Creatinine, Ser: 1.26 mg/dL (ref 0.76–1.27)
Glucose: 240 mg/dL — ABNORMAL HIGH (ref 65–99)
Potassium: 5.1 mmol/L (ref 3.5–5.2)
Sodium: 136 mmol/L (ref 134–144)
eGFR: 57 mL/min/{1.73_m2} — ABNORMAL LOW (ref >59)

## 2020-11-11 MED ORDER — NITROGLYCERIN 0.4 MG SL SUBL: 0.4000 mg | SUBLINGUAL_TABLET | SUBLINGUAL | 3 refills | Status: DC | PRN

## 2020-11-11 NOTE — Patient Instructions (Signed)
Medication Instructions:  Your physician recommends that you continue on your current medications as directed. Please refer to the Current Medication list given to you today.  *If you need a refill on your cardiac medications before your next appointment, please call your pharmacy*   Lab Work: TODAY:bmet If you have labs (blood work) drawn today and your tests are completely normal, you will receive your results only by: [name] MyChart Message (if you have MyChart) OR . A paper copy in the mail If you have any lab test that is abnormal or we need to change your treatment, we will call you to review the results.   Testing/Procedures: none   Follow-Up: At [facility], you and your health needs are our priority.  As part of our continuing mission to provide you with exceptional heart care, we have created designated Provider Care Teams.  These Care Teams include your primary Cardiologist (physician) and Advanced Practice Providers (APPs -  Physician Assistants and Nurse Practitioners) who all work together to provide you with the care you need, when you need it.  We recommend signing up for the patient portal called "MyChart".  Sign up information is provided on this After Visit Summary.  MyChart is used to connect with patients for Virtual Visits (Telemedicine).  Patients are able to view lab/test results, encounter notes, upcoming appointments, etc.  Non-urgent messages can be sent to your provider as well.   To learn more about what you can do with MyChart, go to [email or web address].    Your next appointment:   6 month(s)  The format for your next appointment:   In Person  Provider:   You may see one of the following Advanced Practice Providers on your designated Care Team:    Tereso Newcomer, PA-C  [place], [place]

## 2020-11-11 NOTE — Progress Notes (Signed)
Cardiology Office Note:    Date:  11/11/2020   ID:  Joseph, [name] 04-25-1941, MRN [id number]  PCP:  Dema Severin, NP  Cardiologist:  Kristeen Miss, MD  Electrophysiologist:  None   Referring MD: Dema Severin, NP   Chief Complaint  Patient presents with  . Coronary Artery Disease  . Atrial Fibrillation     Feb. 17, 2020    Joseph [name] is a 80 y.o. male with a hx of coronary artery disease.  He is status post anterior wall myocardial infarction in 2004 treated with a stent to the LAD with a staged PCI to the right coronary artery.  Also had an non-ST segment elevation myocardial infarction in January 2016 treated treated with a DES to the mid LAD and a DES to the mid and distal circumflex artery. He also has a history of paroxysmal atrial fibrillation, hypertension, hyperlipidemia.  He is intolerant to statins.  He has a history of diabetes mellitus and a previous stroke.  He is a previous patient of Dr. Okey Dupre .  He was last seen in our office in July, 2019 by Tereso Newcomer, PA.  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  Denies any chest pain. Rides a stationary bike - does not have any issues with that  Follows a low chol diet .   Has had a rash across his body for the past 2 years .     Might be related to the onset of the Eliquis .  He thinks the rash is worse since he started Coreg.   Oct. 5, 2020 Doing well.    No CP,  Works around the yard,  Hip pain limits his exercise.  Just got through building a work shop .   He brought brought blood work from his primary medical doctor.  Vitamin D level is 40.  Sodium is 140.  Potassium is 4.8.  BUN is 34.  Creatinine is 1.24.  Liver enzymes are normal.  Thyroid profiles are normal.  Total cholesterol is 173.  LDL is 106.  Triglycerides are 160.  HDL is 33.  Hemoglobin A1c is 7.6.  He has hyperlipidemia.  He is tried numerous statins and also has tried the PCSK9 inhibitors.  He did not tolerate any of these.  He  also has tried Zetia but did not tolerate it.  We will continue with diet and exercise.  Dec. 4, 2020 :  [name] is seen today in follow up for a recent hospitalization for unstable angina  Heart catheterization performed on June 11, 2019 reveals diffuse three-vessel coronary artery disease.  He was started on Ranexa 500 mg twice a day.  He was started on Plavix. No bleeding issues.  Developed some neck pain last night  Has occasional random sharp and dull pains in his chest   September 10, 2019: [name] is seen today for follow up of his 3 V cad.   Had PCI recently   He stopped taking Ranexa   Is taking imdur No CP .   Is fairly active  .   Walks on occasion    Aug. 23, 2021  [name] is seen today for follow up of his CAD;, PAF, PAD He was seen by Tereso Newcomer, PA in May . Cath in Oct. 2020 revealed diffuse 3 V cad with no targets for revascularization  His imdur was increased to 180 mg a day Still has occasional CP.  Not exertional  Associated with weakness.  Seems to be partially relieved with getting up and moving around  Is in the [place] study   November 11, 2020 [name] is seen for follow up of his CAD, PAF, PAD He was hospitalized several weeks ago for unstable angina Cath showed mod- severe CAD - was unchanged from previous cath . Recommended medical therapy He has CKD - scheduled to get a BMP today following cath  Seems to be doing well on medical therapy  Has been taking Imdur 120 mg in the am,  30 mg in the evening     Past Medical History:  Diagnosis Date  . Abdominal discomfort    Abdominal discomfort: EGD 2011 showed no significant abnormalities.  Possibly due to metformin.  . Allergic rhinitis   . Anticoagulated 05/30/2016  . CAD (coronary artery disease)    a. s/p MI in 2004:  stents to the LAD and staged stent RCA, EF 55%;  b.  NSTEMI (1/16):  LHC - mid LAD stent with diff dist restenosis, prox to mid Dx mod dsz jailed by stent, small OM1 99, mCFX 75, dCFX  80, mRCA stent ok, mPDA mild to mod dsz, EF 55% >> PCI:  Synergy DES to LAD; Synergy DES (x2) mid and dist CFX  . CAD in native artery 06/12/2019  . Chronic kidney disease, stage 2, mildly decreased GFR    stage 2-3  . 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  s/p back surgery  . DM (diabetes mellitus) (HCC)   . History of CVA (cerebrovascular accident)    Small CVA seen by head CT (incidental) in 1/13.  Carotid dopplers in 1/13 showed minimal disease  . History of Doppler ultrasound    Arterial dopplers (3/11): no evidence for significant PAD. ABIs 10/12: Normal.    . HLD (hyperlipidemia)   . HTN (hypertension)    had side effects with chlorthalidone  . Hx of cardiovascular stress test    Myoview (1/13): Small fixed apical septal defect with no ischemia, EF 58%.  [name] Hx of echocardiogram    Echo (3/11): EF 60%, normal wall motion, mild MR, mild left atrial enlargement, mildly dilated ascending aorta.  [name] PAF (paroxysmal atrial fibrillation) (HCC) 05/29/2016  . Spondylolisthesis    L4-S1  . Statin intolerance     Past Surgical History:  Procedure Laterality Date  . COLONOSCOPY N/A 12/26/2017   Procedure: COLONOSCOPY;  Surgeon: Benancio Deeds, MD;  Location: [facility] ENDOSCOPY;  Service: Gastroenterology;  Laterality: N/A;  . COLONOSCOPY W/ POLYPECTOMY  12/2017  . CORONARY ANGIOGRAM  11/13/2014   Procedure: CORONARY ANGIOGRAM;  Surgeon: Laurey Morale, MD;  Location: [facility] CATH LAB;  Service: Cardiovascular;;  . CORONARY STENT INTERVENTION N/A 05/18/2019   Procedure: CORONARY STENT INTERVENTION;  Surgeon: Marykay Lex, MD;  Location: [facility] INVASIVE CV LAB;  Service: Cardiovascular;  Laterality: N/A;  . CORONARY STENT PLACEMENT  2004   x2 LAD and RCA   . LEFT HEART CATH AND CORONARY ANGIOGRAPHY N/A 05/18/2019   Procedure: LEFT HEART CATH AND CORONARY ANGIOGRAPHY;  Surgeon: Marykay Lex, MD;  Location: [facility] INVASIVE CV LAB;   Service: Cardiovascular;  Laterality: N/A;  . LEFT HEART CATH AND CORONARY ANGIOGRAPHY N/A 06/11/2019   Procedure: LEFT HEART CATH AND CORONARY ANGIOGRAPHY;  Surgeon: Yvonne Kendall, MD;  Location: [facility] INVASIVE CV LAB;  Service: Cardiovascular;  Laterality: N/A;  . LEFT HEART CATH AND CORONARY ANGIOGRAPHY N/A 10/27/2020   Procedure: LEFT HEART  CATH AND CORONARY ANGIOGRAPHY;  Surgeon: Lennette Bihari, MD;  Location: [facility] INVASIVE CV LAB;  Service: Cardiovascular;  Laterality: N/A;  . LEFT HEART CATHETERIZATION WITH CORONARY ANGIOGRAM N/A 08/02/2014   Procedure: LEFT HEART CATHETERIZATION WITH CORONARY ANGIOGRAM;  Surgeon: Corky Crafts, MD;  Location: [facility] CATH LAB;  Service: Cardiovascular;  Laterality: N/A;  . PROSTATE SURGERY    . SPINAL FUSION     L4-S1    Current Medications: No outpatient medications have been marked as taking for the 11/11/20 encounter (Office Visit) with Teddrick Mallari, Deloris Ping, MD.     Allergies:   Bempedoic acid, [name] [alirocumab], Rosuvastatin, Zetia [ezetimibe], and Keflex [cephalexin]   Social History   Socioeconomic History  . Marital status: Divorced    Spouse name: Not on file  . Number of children: 2  . Years of education: Not on file  . Highest education level: Not on file  Occupational History  . Occupation: retired  Tobacco Use  . Smoking status: Never Smoker  . Smokeless tobacco: Never Used  Vaping Use  . Vaping Use: Never used  Substance and Sexual Activity  . Alcohol use: No  . Drug use: No  . Sexual activity: Not Currently  Other Topics Concern  . Not on file  Social History Narrative  . Not on file   Social Determinants of Health   Financial Resource Strain: Not on file  Food Insecurity: Not on file  Transportation Needs: Not on file  Physical Activity: Not on file  Stress: Not on file  Social Connections: Not on file     Family History: The patient's family history includes Heart attack in his father and mother. There is no  history of Stroke.  ROS:   Please see the history of present illness.     All other systems reviewed and are negative.  EKGs/Labs/Other Studies Reviewed:    The following studies were reviewed today:   . Recent Labs: 10/26/2020: ALT 25 10/27/2020: BUN 21; Creatinine, Ser 1.26; Potassium 4.5; Sodium 137 10/28/2020: Hemoglobin 13.2; Platelets 240  Recent Lipid Panel    Component Value Date/Time   CHOL 174 05/18/2019 0210   CHOL 114 07/21/2018 0747   TRIG 136 05/18/2019 0210   HDL 32 (L) 05/18/2019 0210   HDL 38 (L) 07/21/2018 0747   CHOLHDL 5.4 05/18/2019 0210   VLDL 27 05/18/2019 0210   LDLCALC 115 (H) 05/18/2019 0210   LDLCALC 50 07/21/2018 0747   LDLDIRECT 83.0 10/28/2014 1622     ECG:     ASSESSMENT:    No diagnosis found. PLAN:    :  1. 1.  Coronary artery disease:  Cath from Nov. 23, 2020 shows moderate  diffuse 3 V .     His symptoms seem to be fairly well controlled on the current dose of isosorbide.  He takes 120 mg a morning with 30 mg in the evening. Continue aggressive lipid-lowering.   2.  Paroxysmal atrial fibrillation:  Continue current medications.   3.  Hyperlipidemia: Continue current medications. .  .      Medication Adjustments/Labs and Tests Ordered: Current medicines are reviewed at length with the patient today.  Concerns regarding medicines are outlined above.  No orders of the defined types were placed in this encounter.  No orders of the defined types were placed in this encounter.   There are no Patient Instructions on file for this visit.   Signed, Kristeen Miss, MD  11/11/2020 6:00 AM    [facility]

## 2020-12-30 ENCOUNTER — Other Ambulatory Visit: Payer: Self-pay

## 2020-12-30 ENCOUNTER — Encounter: Payer: Medicare Other | Admitting: *Deleted

## 2020-12-30 DIAGNOSIS — Z006 Encounter for examination for normal comparison and control in clinical research program: Secondary | ICD-10-CM

## 2020-12-30 NOTE — Research (Signed)
Patient came in to the clinic today for their follow-up visit for the [facility] 4 Research Study.   Subject signed the addendum to the informed consent before any assessments/questions were completed during visit.   Subject Name: Joseph [name]  Subject met inclusion and exclusion criteria.  The informed consent form, study requirements and expectations were reviewed with the subject and questions and concerns were addressed prior to the signing of the consent form.  The subject verbalized understanding of the trial requirements.  The subject agreed to participate in the [name] trial and signed the informed consent at [place] on 12/30/20  The informed consent was obtained prior to performance of any protocol-specific procedures for the subject.  A copy of the signed informed consent was given to the subject and a copy was placed in the subject's medical record.   Preston Fleeting C   Subject had [place] and [place] blood samples drawn today. All concomitant medications were reviewed and updated if applicable. There are no new AE's or SAE's to report to sponsor at this time. The subject received their IP injection subcutaneously into their right abdomen and tolerated well. Next visit scheduled for Monday, December 5th, 2022 @ 0800 am.

## 2021-02-03 ENCOUNTER — Telehealth: Payer: Self-pay | Admitting: Pharmacist

## 2021-02-03 NOTE — Telephone Encounter (Signed)
Pt called clinic stating his Eliquis is > $400 for a 3 month supply. Sounds like he is in the donut hole. Pt states he paid > $500 for his first Eliquis fill of the year, he thought that was the donut hole but advised him that sounds like a deductible since patients do not start the year off in the donut hole. He is requesting a few months of samples. Advised him we cannot provide him this many samples, but can leave a few weeks up front. He will call his insurance to see if he will be out of the donut hole soon. He applied for [employer] patient assistance earlier this year and was denied.

## 2021-03-07 ENCOUNTER — Other Ambulatory Visit: Payer: Self-pay | Admitting: Cardiovascular Disease

## 2021-04-25 ENCOUNTER — Other Ambulatory Visit: Payer: Self-pay | Admitting: Physician Assistant

## 2021-04-29 ENCOUNTER — Telehealth: Payer: Self-pay | Admitting: Pharmacist

## 2021-04-29 NOTE — Telephone Encounter (Signed)
Pt called clinic asking for Eliquis samples since he is in the donut hole. Advised him we cannot sample him until next year due to lack of sample supply and all other Medicare patients being in the same situation. I will place 2 weeks of samples up front for pt. He does not qualify for BMS pt assistance this year (income too high) but thinks he will next year. I advised him to look into donut hole program with BMS, although he would need to have spent ~3% of annual income on medications to qualify.

## 2021-05-12 ENCOUNTER — Telehealth: Payer: Self-pay | Admitting: Cardiovascular Disease

## 2021-05-12 MED ORDER — RANOLAZINE ER 500 MG PO TB12: 500.0000 mg | ORAL_TABLET | Freq: Two times a day (BID) | ORAL | 3 refills | Status: DC

## 2021-05-12 MED ORDER — HYDRALAZINE HCL 25 MG PO TABS: 25.0000 mg | ORAL_TABLET | Freq: Three times a day (TID) | ORAL | 3 refills | Status: DC

## 2021-05-12 NOTE — Telephone Encounter (Signed)
Phone call transferred to triage.  Per Pt, he was "uncomfortable"  all last night.  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  His BP this AM was 188/88.  He took a nitroglycerin at his doctor's visit this morning.  His blood pressure has decreased since then-last reading 159/79.  States his chest feels better now.  Discussed with Dr. Elease Hashimoto.  Pt had heart cath in April 2022-recommended starting Ranexa if further chest discomfort.  Per Dr. Lawanda Cousins hydralazine 25 mg- one tablet by mouth TID for elevated blood pressure.  Start Ranexa 500 mg-one tablet by mouth twice a day for chest discomfort.  Pt advised of med changes.  Pt has follow up scheduled May 19 2021 with SW.  Pt aware.

## 2021-05-12 NOTE — Telephone Encounter (Signed)
Pt c/o of Chest Pain: STAT if CP now or developed within 24 hours  1. Are you having CP right now? NOT RIGHT NOW BUT A FEW MINUTES AGO.   2. Are you experiencing any other symptoms (ex. SOB, nausea, vomiting, sweating)? PT WOKE UP A FEW TIMES LAST NIGHT BREATHING REALLY FAST  3. How long have you been experiencing CP? YESTERDAY EVENING  4. Is your CP continuous or coming and going? COMING AND GOIN  5. Have you taken Nitroglycerin? YES PT TOOK NITRO AT DR.'S OFFICE ?

## 2021-05-19 ENCOUNTER — Ambulatory Visit (INDEPENDENT_AMBULATORY_CARE_PROVIDER_SITE_OTHER): Payer: Medicare Other | Admitting: Physician Assistant

## 2021-05-19 ENCOUNTER — Encounter: Payer: Self-pay | Admitting: Physician Assistant

## 2021-05-19 ENCOUNTER — Other Ambulatory Visit: Payer: Self-pay

## 2021-05-19 ENCOUNTER — Telehealth: Payer: Self-pay | Admitting: *Deleted

## 2021-05-19 VITALS — BP 128/64 | HR 68 | Ht 74.0 in | Wt 222.0 lb

## 2021-05-19 DIAGNOSIS — I480 Paroxysmal atrial fibrillation: Secondary | ICD-10-CM

## 2021-05-19 DIAGNOSIS — I10 Essential (primary) hypertension: Secondary | ICD-10-CM | POA: Diagnosis not present

## 2021-05-19 DIAGNOSIS — I25118 Atherosclerotic heart disease of native coronary artery with other forms of angina pectoris: Secondary | ICD-10-CM

## 2021-05-19 DIAGNOSIS — I200 Unstable angina: Secondary | ICD-10-CM

## 2021-05-19 DIAGNOSIS — E7800 Pure hypercholesterolemia, unspecified: Secondary | ICD-10-CM

## 2021-05-19 DIAGNOSIS — R5383 Other fatigue: Secondary | ICD-10-CM

## 2021-05-19 NOTE — Assessment & Plan Note (Signed)
Hx of prior MI and multiple PCI procedures.  Cardiac catheterization in 10/2020 demonstrated stable anatomy.  He called in recently with recurrent chest pain. He was started on Ranolazine and has not had significant chest pain since.  Continue Amlodipine 10 mg once daily, carvedilol 6.25 mg twice daily, clopidogrel 75 mg daily, isosorbide mononitrate 120 mg in the morning and 30 mg in the evening, ranolazine 500 mg twice daily.  Follow-up in 6 months.

## 2021-05-19 NOTE — Assessment & Plan Note (Signed)
Maintaining NSR on exam.  I will request his most recent EKG from his PCP.   Lab Results  Component Value Date   CREATININE 1.26 11/11/2020   HGB 13.2 10/28/2020  Continue Apixaban 5 mg twice daily.

## 2021-05-19 NOTE — Progress Notes (Signed)
Cardiology Office Note:    Date:  05/19/2021   ID:  Joseph [name], DOB 1940-09-05, MRN [id number]  PCP:  Imagene Riches, NP   [facility] Providers Cardiologist:  Mertie Moores, MD     Referring MD: Imagene Riches, NP   Chief Complaint:  F/u for CAD    Patient Profile:   Joseph [name] is a 80 y.o. male with:  Coronary artery disease  S/p anterior STEMI in 2004 >> tx with stent to LAD Staged PCI with Taxus DES to RCA NSTEMI in 1/16 >> tx with DES to mLAD and DES to mid and dist LCx [place] 04/2019: s/p DES x 2 to dist RCA/PDA Cath 05/2019: LAD, LCx, RCA stents patent; jailed D2 10-75, RI 100, OM2 32 >> med Rx  [place] 4/21 >> Med Rx continued Pt intol of Ranolazine (chest and epigastric burning) Cath 4/22: stable anatomy >> med Rx  Paroxysmal atrial fibrillation CHA2DS2-VASc=7 (age x 2, Diab, CAD, HTN, CVA) >> Apixaban  Hypertension  Hyperlipidemia  Statin intolerant  Diabetes mellitus  Prior CVA  Hx of LGI bleed after polypectomy in 6/19  History of Present Illness: Mr. Schlichte was admitted in 10/2020 with unstable angina pectoris.  Troponins were negative for ACS.  Cardiac catheterization was performed in 4/22 and did not appear significantly changed from the previous study in November 2020.  There was 50% distal left main stenosis which was not significantly different.  There is no change in diffuse diagonal stenosis of the LAD and 30% in-stent restenosis in the LAD.  LCx stents were patent.  OM 2 was small caliber with 90% stenosis.  There was 30% stenosis in the RCA and the distal stent extending into the PDA was patent.  EF is 40%.  Medical therapy was recommended.  He was last seen by Dr. Acie Fredrickson in April 2022.  He called in recently with elevated blood pressures and chest pain.  Ranolazine was added to his medications.  He was also started on hydralazine.  He returns for follow-up.  He is here alone.  He has not had any further chest pain.  He has chronic shortness of breath with  exertion, however his breathing is overall stable.  He has not had syncope.  He has some mild leg edema without significant change.  He sleeps in a recliner chronically due to back pain.  ASSESSMENT & PLAN:    Paroxysmal atrial fibrillation (HCC) Maintaining NSR on exam.  I will request his most recent EKG from his PCP.   Lab Results  Component Value Date   CREATININE 1.26 11/11/2020   HGB 13.2 10/28/2020  Continue Apixaban 5 mg twice daily.  Coronary artery disease of native artery of native heart with stable angina pectoris ([place]) Hx of prior MI and multiple PCI procedures.  Cardiac catheterization in 10/2020 demonstrated stable anatomy.  He called in recently with recurrent chest pain. He was started on Ranolazine and has not had significant chest pain since.  Continue Amlodipine 10 mg once daily, carvedilol 6.25 mg twice daily, clopidogrel 75 mg daily, isosorbide mononitrate 120 mg in the morning and 30 mg in the evening, ranolazine 500 mg twice daily.  Follow-up in 6 months.  Essential hypertension Blood pressure appears to be well controlled.  He brings in a list of blood pressures from home.  Some of his blood pressure readings are in the 0000000, systolic.  I repeated his blood pressure today and it was 136/78 on the right and 134/76 on  the left.  I have asked him to monitor his blood pressure the next couple weeks and send me some readings for review.  I have asked him to check his blood pressure 2 hours after taking his medications, once or twice a day.  For now, continue amlodipine 10 mg daily, carvedilol 6.25 mg twice daily, hydralazine 25 mg 3 times a day, isosorbide 120 mg in the morning + 30 mg in the evening, olmesartan 40 mg daily.  Pure hypercholesterolemia He is intolerant of statins, bempedoic acid and PCSK9 inhibitors as well as ezetimibe.  He is still enrolled in the [place] trial.  Fatigue He has had symptoms of fatigue for the last 6 months or so.  His back has been  bothering him quite a bit in that timeframe and he has no longer doing the exercises he was used to doing.  I suspect his fatigue is all related to that.  He did have labs recently as well as an EKG with primary care.  I will request those for review.  If his fatigue continues, he should follow-up with primary care.  I have encouraged him to try to increase his activity as much as he can.  We discussed trying to go to the [facility] and getting in the pool or possibly riding a recumbent bike.       Addendum: Labs from primary care received.  01/23/2021: K+ 5.2, creatinine 1.26; electrocardiogram 05/12/2021: Sinus rhythm, HR 54, left axis deviation, septal Q waves, QTC 430 ms, no ST-T wave changes  Dispo:  Return in about 6 months (around 11/16/2021) for Routine Follow Up, w/ Dr. Acie Fredrickson, or Richardson Dopp, PA-C.    Prior CV studies: Echocardiogram 10/28/2020 EF 60-65, no RWMA, normal RVSF, trivial MR  Cardiac catheterization 10/27/20 Distal LM-ostial LAD 50 LAD proximal-mid 40, mid-distal 30; sidebranch in D2 70, D2 80, D3 25 RI small 100 CTO LCx prox stent patent; OM2 90; LPAV stent patent RCA prox stent patent w 5 ISR, mid 35, dist stent into PDA patent  EF 40   ABIs 12/19/19 Non-compressible Waveforms at ankle suggest normal study   Echocardiogram 06/08/2019 EF 55-60, mod LVH, ant/sept HK, inf HK, normal RVSF, mild dilation of asc Ao (41 mm)   Cardiac catheterization 06/11/2019 LAD prox 40, mid stent 40 ISR; D2 (jailed) 32 prox and 60 mid; D3 71 RI 100 (CTO) LCx stents patent; OM2 90 (not amenable to PCI) RCA prox stent patent w/ 5 ISR, dist stent patent; RPDA stent patent   Cardiac catheterization 05/18/2019 Distal RCA 90, RPDA 70 PCI:  DES x 2 to distal RCA/PDA   Echocardiogram 05/18/2019 EF 55-60, mod LVH, HK c/w LAD territory ischemia, Gr 1 DD, normal RVSF   Myoview 11/22/2018 EF 60, small apical septal infarct, no ischemia, low risk    Nuclear stress test 11/14/17 Low risk stress  nuclear study with a fixed anteroseptal perfusion defect and borderline reduction in left ventricular systolic function. EF 54   Echo 09/30/17 Mild concentric LVH, EF 55-60, normal wall motion, grade 2 diastolic dysfunction, mildly dilated ascending aorta (38 mm), mild MR, mild LAE, PASP 26   Event monitor 06/09/16 NSR.  No atrial fibrillation noted.    Nuclear stress test 04/08/16 Large, fixed anteroseptal perfusion defect with associated akinesis suggestive of scar. No significant reversible ischemia. LVEF 55%. This is an intermediate risk study based on infarct size (20%).   Cardiac Catheterization 11/13/14 LM distal 30-40 LAD mid stent patent; D1 proximal 80-90 (med rx recommended)  LCx stents in the mid AV LCx patent; OM1 proximal 100; OM3 proximal 90 (small) RCA irregularities; PLV serial 50 (small); PDA mid 40-50, distal 30   Echo 10/31/14 Moderate concentric LVH, EF 60-65, normal wall motion, grade 1 diastolic dysfunction, ascending aorta 41 mm, moderate TR     Past Medical History:  Diagnosis Date   Abdominal discomfort    Abdominal discomfort: EGD 2011 showed no significant abnormalities.  Possibly due to metformin.   Allergic rhinitis    Anticoagulated 05/30/2016   CAD (coronary artery disease)    a. s/p MI in 2004:  stents to the LAD and staged stent RCA, EF 55%;  b.  NSTEMI (1/16):  LHC - mid LAD stent with diff dist restenosis, prox to mid Dx mod dsz jailed by stent, small OM1 99, mCFX 75, dCFX 80, mRCA stent ok, mPDA mild to mod dsz, EF 55% >> PCI:  Synergy DES to LAD; Synergy DES (x2) mid and dist CFX   CAD in native artery 06/12/2019   Chronic kidney disease, stage 2, mildly decreased GFR    stage 2-3   DDD (degenerative disc disease)    L4 to S1 degenerative disease and spondylolisthesis: low back pain.  s/p back surgery   DM (diabetes mellitus) (HCC)    History of CVA (cerebrovascular accident)    Small CVA seen by head CT (incidental) in 1/13.  Carotid dopplers in  1/13 showed minimal disease   History of Doppler ultrasound    Arterial dopplers (3/11): no evidence for significant PAD. ABIs 10/12: Normal.     HLD (hyperlipidemia)    HTN (hypertension)    had side effects with chlorthalidone   Hx of cardiovascular stress test    Myoview (1/13): Small fixed apical septal defect with no ischemia, EF 58%.   Hx of echocardiogram    Echo (3/11): EF 60%, normal wall motion, mild MR, mild left atrial enlargement, mildly dilated ascending aorta.   PAF (paroxysmal atrial fibrillation) (HCC) 05/29/2016   Spondylolisthesis    L4-S1   Statin intolerance    Current Medications: Current Meds  Medication Sig   acetaminophen (TYLENOL) 500 MG tablet Take 1,000 mg by mouth as needed for moderate pain.   amLODipine (NORVASC) 10 MG tablet Take 10 mg by mouth daily.   apixaban (ELIQUIS) 5 MG TABS tablet Take 1 tablet (5 mg total) by mouth 2 (two) times daily.   carvedilol (COREG) 6.25 MG tablet TAKE 1 TABLET BY MOUTH TWICE A DAY   Cholecalciferol (VITAMIN D3) 25 MCG (1000 UT) CAPS Take 1,000 Units by mouth daily.   clopidogrel (PLAVIX) 75 MG tablet Take 1 tablet (75 mg total) by mouth daily.   Coenzyme Q10 100 MG capsule Take 100 mg by mouth daily.   diltiazem (CARDIZEM) 30 MG tablet Take 1 tablet (30 mg total) by mouth as needed (Rapid Palpitations).   fish oil-omega-3 fatty acids 1000 MG capsule Take 2 g by mouth in the morning and at bedtime.   fluticasone (FLONASE) 50 MCG/ACT nasal spray Place 2 sprays into both nostrils daily as needed for allergies or rhinitis.    glipiZIDE (GLUCOTROL) 10 MG tablet Take 10 mg by mouth 2 (two) times daily.   hydrALAZINE (APRESOLINE) 25 MG tablet Take 1 tablet (25 mg total) by mouth 3 (three) times daily.   hydroxypropyl methylcellulose / hypromellose (ISOPTO TEARS / GONIOVISC) 2.5 % ophthalmic solution Place 1 drop into both eyes 3 (three) times daily as needed for dry eyes.   isosorbide  mononitrate (IMDUR) 120 MG 24 hr tablet  Take 120 mg by mouth every morning.   isosorbide mononitrate (IMDUR) 30 MG 24 hr tablet Take 30 mg by mouth every evening.   metFORMIN (GLUCOPHAGE-XR) 500 MG 24 hr tablet Take 500 mg by mouth daily.   Multiple Vitamins-Minerals (ZINC PO) Take 1 tablet by mouth daily.   nitroGLYCERIN (NITROSTAT) 0.4 MG SL tablet Place 1 tablet (0.4 mg total) under the tongue every 5 (five) minutes as needed for chest pain (x 3 doses).   olmesartan (BENICAR) 40 MG tablet Take 40 mg by mouth daily.   ranolazine (RANEXA) 500 MG 12 hr tablet Take 1 tablet (500 mg total) by mouth 2 (two) times daily.   Study - [name] - inclisiran 300 mg/1.98mL or placebo SQ injection (PI-Stuckey) Inject 300 mg into the skin every 6 (six) months.   TRESIBA FLEXTOUCH 100 UNIT/ML FlexTouch Pen Inject 30-34 Units into the skin daily.   triamcinolone cream (KENALOG) 0.5 % Apply 1 application topically as needed for irritation.    Allergies:   Bempedoic acid, [name] [alirocumab], Rosuvastatin, Zetia [ezetimibe], and Keflex [cephalexin]   Social History   Tobacco Use   Smoking status: Never   Smokeless tobacco: Never  Vaping Use   Vaping Use: Never used  Substance Use Topics   Alcohol use: No   Drug use: No    Family Hx: The patient's family history includes Heart attack in his father and mother. There is no history of Stroke.  Review of Systems  Constitutional: Positive for malaise/fatigue.  Musculoskeletal:  Positive for back pain.    EKGs/Labs/Other Test Reviewed:    EKG:  EKG is not ordered today.  The ekg ordered today demonstrates n/a  Recent Labs: 10/26/2020: ALT 25 10/28/2020: Hemoglobin 13.2; Platelets 240 11/11/2020: BUN 27; Creatinine, Ser 1.26; Potassium 5.1; Sodium 136   Recent Lipid Panel Lab Results  Component Value Date/Time   CHOL 174 05/18/2019 02:10 AM   CHOL 114 07/21/2018 07:47 AM   TRIG 136 05/18/2019 02:10 AM   HDL 32 (L) 05/18/2019 02:10 AM   HDL 38 (L) 07/21/2018 07:47 AM   LDLCALC 115 (H)  05/18/2019 02:10 AM   LDLCALC 50 07/21/2018 07:47 AM   LDLDIRECT 83.0 10/28/2014 04:22 PM     Risk Assessment/Calculations:    CHA2DS2-VASc Score = 7   This indicates a 11.2% annual risk of stroke. The patient's score is based upon: CHF History: 0 HTN History: 1 Diabetes History: 1 Stroke History: 2 Vascular Disease History: 1 Age Score: 2 Gender Score: 0         Physical Exam:    VS:  BP 128/64   Pulse 68   Ht 6\' 2"  (1.88 m)   Wt 222 lb (100.7 kg)   SpO2 96%   BMI 28.50 kg/m     Wt Readings from Last 3 Encounters:  05/19/21 222 lb (100.7 kg)  11/11/20 222 lb (100.7 kg)  10/28/20 215 lb 8 oz (97.8 kg)    Constitutional:      Appearance: Healthy appearance. Not in distress.  Neck:     Vascular: No JVR. JVD normal.  Pulmonary:     Effort: Pulmonary effort is normal.     Breath sounds: No wheezing. No rales.  Cardiovascular:     Normal rate. Regular rhythm. Normal S1. Normal S2.      Murmurs: There is no murmur.  Edema:    Peripheral edema present.    Pretibial: bilateral trace edema of  the pretibial area. Abdominal:     Palpations: Abdomen is soft.  Skin:    General: Skin is warm and dry.  Neurological:     General: No focal deficit present.     Mental Status: Alert and oriented to person, place and time.     Cranial Nerves: Cranial nerves are intact.      Medication Adjustments/Labs and Tests Ordered: Current medicines are reviewed at length with the patient today.  Concerns regarding medicines are outlined above.  Tests Ordered: No orders of the defined types were placed in this encounter.  Medication Changes: No orders of the defined types were placed in this encounter.  Signed, Richardson Dopp, PA-C  05/19/2021 4:51 PM    [facility] [place], [place], [place]  [address] Phone: [phone]; Fax: [phone]

## 2021-05-19 NOTE — Assessment & Plan Note (Signed)
He is intolerant of statins, bempedoic acid and PCSK9 inhibitors as well as ezetimibe.  He is still enrolled in the [place] trial.

## 2021-05-19 NOTE — Patient Instructions (Signed)
Medication Instructions:   Your physician recommends that you continue on your current medications as directed. Please refer to the Current Medication list given to you today.  *If you need a refill on your cardiac medications before your next appointment, please call your pharmacy*   Lab Work:  -NONE  If you have labs (blood work) drawn today and your tests are completely normal, you will receive your results only by: MyChart Message (if you have MyChart) OR A paper copy in the mail If you have any lab test that is abnormal or we need to change your treatment, we will call you to review the results.   Testing/Procedures:  -NONE   Follow-Up: At [facility], you and your health needs are our priority.  As part of our continuing mission to provide you with exceptional heart care, we have created designated Provider Care Teams.  These Care Teams include your primary Cardiologist (physician) and Advanced Practice Providers (APPs -  Physician Assistants and Nurse Practitioners) who all work together to provide you with the care you need, when you need it.  We recommend signing up for the patient portal called "MyChart".  Sign up information is provided on this After Visit Summary.  MyChart is used to connect with patients for Virtual Visits (Telemedicine).  Patients are able to view lab/test results, encounter notes, upcoming appointments, etc.  Non-urgent messages can be sent to your provider as well.   To learn more about what you can do with MyChart, go to [email or web address].    Your next appointment:   6 month(s)  The format for your next appointment:   In Person  Provider:   You may see Kristeen Miss, MD or one of the following Advanced Practice Providers on your designated Care Team:   Tereso Newcomer, PA-C   Other Instructions  Your physician wants you to follow-up in: 6 months with Dr. Elease Hashimoto or Tereso Newcomer, PA-C. You will receive a reminder letter in the mail two  months in advance. If you don't receive a letter, please call our office to schedule the follow-up appointment.   Please check your blood pressure 2 hours after hydralazine 1-2 X per day X 2 weeks. You can call with readings [phone] or mail to [address] [employer] [phone].

## 2021-05-19 NOTE — Assessment & Plan Note (Signed)
Blood pressure appears to be well controlled.  He brings in a list of blood pressures from home.  Some of his blood pressure readings are in the 150s-160s, systolic.  I repeated his blood pressure today and it was 136/78 on the right and 134/76 on the left.  I have asked him to monitor his blood pressure the next couple weeks and send me some readings for review.  I have asked him to check his blood pressure 2 hours after taking his medications, once or twice a day.  For now, continue amlodipine 10 mg daily, carvedilol 6.25 mg twice daily, hydralazine 25 mg 3 times a day, isosorbide 120 mg in the morning + 30 mg in the evening, olmesartan 40 mg daily.

## 2021-05-19 NOTE — Assessment & Plan Note (Signed)
He has had symptoms of fatigue for the last 6 months or so.  His back has been bothering him quite a bit in that timeframe and he has no longer doing the exercises he was used to doing.  I suspect his fatigue is all related to that.  He did have labs recently as well as an EKG with primary care.  I will request those for review.  If his fatigue continues, he should follow-up with primary care.  I have encouraged him to try to increase his activity as much as he can.  We discussed trying to go to the [facility] and getting in the pool or possibly riding a recumbent bike.

## 2021-05-20 NOTE — Telephone Encounter (Signed)
Received records from PCP

## 2021-05-29 ENCOUNTER — Emergency Department (HOSPITAL_COMMUNITY): Payer: Medicare Other

## 2021-05-29 ENCOUNTER — Other Ambulatory Visit: Payer: Self-pay

## 2021-05-29 ENCOUNTER — Encounter (HOSPITAL_COMMUNITY): Payer: Self-pay | Admitting: Emergency Medicine

## 2021-05-29 ENCOUNTER — Emergency Department (HOSPITAL_COMMUNITY)
Admission: EM | Admit: 2021-05-29 | Discharge: 2021-05-30 | Disposition: A | Discharge status: Home / Self Care | Payer: Medicare Other | Attending: Emergency Medicine | Admitting: Emergency Medicine

## 2021-05-29 DIAGNOSIS — Z7902 Long term (current) use of antithrombotics/antiplatelets: Secondary | ICD-10-CM | POA: Diagnosis not present

## 2021-05-29 DIAGNOSIS — I2510 Atherosclerotic heart disease of native coronary artery without angina pectoris: Secondary | ICD-10-CM | POA: Insufficient documentation

## 2021-05-29 DIAGNOSIS — M542 Cervicalgia: Secondary | ICD-10-CM | POA: Insufficient documentation

## 2021-05-29 DIAGNOSIS — Z7984 Long term (current) use of oral hypoglycemic drugs: Secondary | ICD-10-CM | POA: Insufficient documentation

## 2021-05-29 DIAGNOSIS — Z7901 Long term (current) use of anticoagulants: Secondary | ICD-10-CM | POA: Diagnosis not present

## 2021-05-29 DIAGNOSIS — I129 Hypertensive chronic kidney disease with stage 1 through stage 4 chronic kidney disease, or unspecified chronic kidney disease: Secondary | ICD-10-CM | POA: Diagnosis not present

## 2021-05-29 DIAGNOSIS — Z79899 Other long term (current) drug therapy: Secondary | ICD-10-CM | POA: Diagnosis not present

## 2021-05-29 DIAGNOSIS — N182 Chronic kidney disease, stage 2 (mild): Secondary | ICD-10-CM | POA: Diagnosis not present

## 2021-05-29 DIAGNOSIS — E1122 Type 2 diabetes mellitus with diabetic chronic kidney disease: Secondary | ICD-10-CM | POA: Insufficient documentation

## 2021-05-29 DIAGNOSIS — R079 Chest pain, unspecified: Secondary | ICD-10-CM | POA: Diagnosis present

## 2021-05-29 DIAGNOSIS — R0789 Other chest pain: Secondary | ICD-10-CM | POA: Insufficient documentation

## 2021-05-29 LAB — CBC WITH DIFFERENTIAL/PLATELET
Abs Immature Granulocytes: 0.04 10*3/uL (ref 0.00–0.07)
Basophils Absolute: 0.1 10*3/uL (ref 0.0–0.1)
Basophils Relative: 1 %
Eosinophils Absolute: 0.4 10*3/uL (ref 0.0–0.5)
Eosinophils Relative: 5 %
HCT: 39.3 % (ref 39.0–52.0)
Hemoglobin: 13.5 g/dL (ref 13.0–17.0)
Immature Granulocytes: 1 %
Lymphocytes Relative: 20 %
Lymphs Abs: 1.6 10*3/uL (ref 0.7–4.0)
MCH: 31.3 pg (ref 26.0–34.0)
MCHC: 34.4 g/dL (ref 30.0–36.0)
MCV: 91.2 fL (ref 80.0–100.0)
Monocytes Absolute: 0.6 10*3/uL (ref 0.1–1.0)
Monocytes Relative: 7 %
Neutro Abs: 5.6 10*3/uL (ref 1.7–7.7)
Neutrophils Relative %: 66 %
Platelets: 264 10*3/uL (ref 150–400)
RBC: 4.31 MIL/uL (ref 4.22–5.81)
RDW: 11.7 % (ref 11.5–15.5)
WBC: 8.3 10*3/uL (ref 4.0–10.5)
nRBC: 0 % (ref 0.0–0.2)

## 2021-05-29 LAB — COMPREHENSIVE METABOLIC PANEL
ALT: 24 U/L (ref 0–44)
AST: 21 U/L (ref 15–41)
Albumin: 4 g/dL (ref 3.5–5.0)
Alkaline Phosphatase: 72 U/L (ref 38–126)
Anion gap: 10 (ref 5–15)
BUN: 25 mg/dL — ABNORMAL HIGH (ref 8–23)
CO2: 23 mmol/L (ref 22–32)
Calcium: 9.8 mg/dL (ref 8.9–10.3)
Chloride: 101 mmol/L (ref 98–111)
Creatinine, Ser: 1.37 mg/dL — ABNORMAL HIGH (ref 0.61–1.24)
GFR, Estimated: 52 mL/min — ABNORMAL LOW (ref >60)
Glucose, Bld: 230 mg/dL — ABNORMAL HIGH (ref 70–99)
Potassium: 4.7 mmol/L (ref 3.5–5.1)
Sodium: 134 mmol/L — ABNORMAL LOW (ref 135–145)
Total Bilirubin: 0.7 mg/dL (ref 0.3–1.2)
Total Protein: 6.6 g/dL (ref 6.5–8.1)

## 2021-05-29 LAB — TROPONIN I (HIGH SENSITIVITY): Troponin I (High Sensitivity): 22 ng/L — ABNORMAL HIGH (ref <18)

## 2021-05-29 NOTE — ED Notes (Signed)
Daughter states patient is feeling better and she is taking him home to lie down.

## 2021-05-29 NOTE — ED Provider Notes (Signed)
Emergency Medicine Provider Triage Evaluation Note  Joseph [name] , a 80 y.o. male  was evaluated in triage.  Pt complains of complaint of gradual onset, constant, burning, substernal chest pain radiating into his shoulder blades that began around 6 PM tonight.  He denies any shortness of breath, diaphoresis, nausea, vomiting.  He reports history of heart attack 17 to 18 years ago however this feels less severe.  He does mention that he recently started hydralazine.  Shortly after starting it he noticed some pain in his upper back.  He stopped taking it and the pain dissipated.  He restarted taking it yesterday and then began experiencing pain today.  He states the pain in his back currently feels different than a couple of days ago when he for started taking the hydralazine.  He is a non-smoker.  Review of Systems  Positive: + chest pain Negative: - nausea, vomiting, diaphoresis, SOB  Physical Exam  There were no vitals taken for this visit. Gen:   Awake, no distress   Resp:  Normal effort  MSK:   Moves extremities without difficulty  Other:  RRR. 2+ radial and PT pulses bilaterally   Medical Decision Making  Medically screening exam initiated at 9:43 PM.  Appropriate orders placed.  [name] was informed that the remainder of the evaluation will be completed by another provider, this initial triage assessment does not replace that evaluation, and the importance of remaining in the ED until their evaluation is complete.     Tanda Rockers, PA-C 05/29/21 2145    Ernie Avena, MD 05/29/21 2256

## 2021-05-29 NOTE — ED Triage Notes (Signed)
Pt BIB [facility] from home, c/o chest pain that started at 1800 today. Pain radiates through his shoulders and up his neck. Took 2NTG and 324mg  asa prior to EMS arrival. Recently started on hydralazine.

## 2021-05-30 DIAGNOSIS — R0789 Other chest pain: Secondary | ICD-10-CM | POA: Diagnosis not present

## 2021-05-30 LAB — TROPONIN I (HIGH SENSITIVITY): Troponin I (High Sensitivity): 23 ng/L — ABNORMAL HIGH (ref <18)

## 2021-05-30 MED ORDER — CYCLOBENZAPRINE HCL 10 MG PO TABS
5.0000 mg | ORAL_TABLET | Freq: Once | ORAL | Status: AC
  Administered 2021-05-30: 5 mg via ORAL
  Filled 2021-05-30: qty 1

## 2021-05-30 NOTE — ED Provider Notes (Signed)
[facility] EMERGENCY DEPARTMENT Provider Note   CSN: [id number] Arrival date & time: 05/29/21  2135     History Chief Complaint  Patient presents with   Chest Pain    Joseph [name] is a 80 y.o. male.  HPI     Is an 80 year old male with history of coronary artery disease (stable anatomy on 4/22 cardiac catheterization), hypertension, hyperlipidemia who presents with neck pain and chest pain.  Patient reports he was recently started on medication by his doctor.  He states he does not remember the name of the medication although he believes side effects include muscle aches.  Several days ago he began to have muscle aches in the bilateral shoulders.  He stopped his medication for 2 days and the aches went away.  He reinitiated this medication last night.  And took multiple doses.  After getting out of the shower tonight he noted pain in the left side of his neck and left chest.  He states "I could barely move my neck."  Denies any exertional symptoms.  States that the pain lasted for several hours but has since resolved.  Currently he is chest pain-free.  He denies any numbness or tingling.  He does report some ongoing left upper neck discomfort.  Rates that a 2 out of 10.  Patient indicates that the medication he is taking is for his blood pressure and he takes it 3 times a day.  Based on chart review, this is likely hydralazine.  He also is noted to have a significant intolerance to statins and other lipid-lowering medications related to body aches and muscle pains as well.  Past Medical History:  Diagnosis Date   Abdominal discomfort    Abdominal discomfort: EGD 2011 showed no significant abnormalities.  Possibly due to metformin.   Allergic rhinitis    Anticoagulated 05/30/2016   CAD (coronary artery disease)    a. s/p MI in 2004:  stents to the LAD and staged stent RCA, EF 55%;  b.  NSTEMI (1/16):  LHC - mid LAD stent with diff dist restenosis, prox to mid Dx  mod dsz jailed by stent, small OM1 99, mCFX 75, dCFX 80, mRCA stent ok, mPDA mild to mod dsz, EF 55% >> PCI:  Synergy DES to LAD; Synergy DES (x2) mid and dist CFX   CAD in native artery 06/12/2019   Chronic kidney disease, stage 2, mildly decreased GFR    stage 2-3   DDD (degenerative disc disease)    L4 to S1 degenerative disease and spondylolisthesis: low back pain.  s/p back surgery   DM (diabetes mellitus) ([place])    History of CVA (cerebrovascular accident)    Small CVA seen by head CT (incidental) in 1/13.  Carotid dopplers in 1/13 showed minimal disease   History of Doppler ultrasound    Arterial dopplers (3/11): no evidence for significant PAD. ABIs 10/12: Normal.     HLD (hyperlipidemia)    HTN (hypertension)    had side effects with chlorthalidone   Hx of cardiovascular stress test    Myoview (1/13): Small fixed apical septal defect with no ischemia, EF 58%.   Hx of echocardiogram    Echo (3/11): EF 60%, normal wall motion, mild MR, mild left atrial enlargement, mildly dilated ascending aorta.   PAF (paroxysmal atrial fibrillation) ([place]) 05/29/2016   Spondylolisthesis    L4-S1   Statin intolerance     Patient Active Problem List   Diagnosis Date Noted   CAD  in native artery 06/12/2019   Chest pain 06/07/2019   Unstable angina (HCC) 05/17/2019   Lower GI bleed 12/26/2017   Hypotension 12/26/2017   Postural dizziness with presyncope 12/26/2017   Fatigue 09/22/2017   Coronary artery disease of native artery of native heart with stable angina pectoris (HCC) 04/11/2017   Hyperlipidemia 04/11/2017   Anticoagulated 05/30/2016   Paroxysmal atrial fibrillation (HCC) 05/29/2016   Exertional angina (HCC) 08/02/2014   Bradycardia 02/02/2012   CVA (cerebral infarction) 08/01/2011   Leg pain 03/31/2011   Abdominal pain 10/13/2010   Hip pain 10/13/2010   Pure hypercholesterolemia 09/09/2009   Essential hypertension 09/09/2009   Peripheral vascular disease (HCC) 09/09/2009    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   CAD S/P percutaneous coronary angioplasty 09/08/2009    Past Surgical History:  Procedure Laterality Date   COLONOSCOPY N/A 12/26/2017   Procedure: COLONOSCOPY;  Surgeon: Benancio Deeds, MD;  Location: [facility] ENDOSCOPY;  Service: Gastroenterology;  Laterality: N/A;   COLONOSCOPY W/ POLYPECTOMY  12/2017   CORONARY ANGIOGRAM  11/13/2014   Procedure: CORONARY ANGIOGRAM;  Surgeon: Laurey Morale, MD;  Location: [facility] CATH LAB;  Service: Cardiovascular;;   CORONARY STENT INTERVENTION N/A 05/18/2019   Procedure: CORONARY STENT INTERVENTION;  Surgeon: Marykay Lex, MD;  Location: [facility] INVASIVE CV LAB;  Service: Cardiovascular;  Laterality: N/A;   CORONARY STENT PLACEMENT  2004   x2 LAD and RCA    LEFT HEART CATH AND CORONARY ANGIOGRAPHY N/A 05/18/2019   Procedure: LEFT HEART CATH AND CORONARY ANGIOGRAPHY;  Surgeon: Marykay Lex, MD;  Location: [facility] INVASIVE CV LAB;  Service: Cardiovascular;  Laterality: N/A;   LEFT HEART CATH AND CORONARY ANGIOGRAPHY N/A 06/11/2019   Procedure: LEFT HEART CATH AND CORONARY ANGIOGRAPHY;  Surgeon: Yvonne Kendall, MD;  Location: [facility] INVASIVE CV LAB;  Service: Cardiovascular;  Laterality: N/A;   LEFT HEART CATH AND CORONARY ANGIOGRAPHY N/A 10/27/2020   Procedure: LEFT HEART CATH AND CORONARY ANGIOGRAPHY;  Surgeon: Lennette Bihari, MD;  Location: [facility] INVASIVE CV LAB;  Service: Cardiovascular;  Laterality: N/A;   LEFT HEART CATHETERIZATION WITH CORONARY ANGIOGRAM N/A 08/02/2014   Procedure: LEFT HEART CATHETERIZATION WITH CORONARY ANGIOGRAM;  Surgeon: Corky Crafts, MD;  Location: [facility] CATH LAB;  Service: Cardiovascular;  Laterality: N/A;   PROSTATE SURGERY     SPINAL FUSION     L4-S1       Family History  Problem Relation Age of Onset   Heart attack Mother    Heart attack Father    Stroke Neg Hx     Social History   Tobacco Use   Smoking status: Never   Smokeless tobacco: Never   Vaping Use   Vaping Use: Never used  Substance Use Topics   Alcohol use: No   Drug use: No    Home Medications Prior to Admission medications   Medication Sig Start Date End Date Taking? Authorizing Provider  acetaminophen (TYLENOL) 500 MG tablet Take 1,000 mg by mouth as needed for moderate pain.    [provider]  amLODipine (NORVASC) 10 MG tablet Take 10 mg by mouth daily. 05/22/20   [provider]  apixaban (ELIQUIS) 5 MG TABS tablet Take 1 tablet (5 mg total) by mouth 2 (two) times daily. 10/30/20   Nahser, Deloris Ping, MD  carvedilol (COREG) 6.25 MG tablet TAKE 1 TABLET BY MOUTH TWICE A DAY 04/27/21   Nahser, Deloris Ping, MD  Cholecalciferol (VITAMIN D3) 25 MCG (1000 UT) CAPS  Take 1,000 Units by mouth daily.    [provider]  clopidogrel (PLAVIX) 75 MG tablet Take 1 tablet (75 mg total) by mouth daily. 08/27/20   Richardson Dopp T, PA-C  Coenzyme Q10 100 MG capsule Take 100 mg by mouth daily.    [provider]  diltiazem (CARDIZEM) 30 MG tablet Take 1 tablet (30 mg total) by mouth as needed (Rapid Palpitations). 01/31/18   Richardson Dopp T, PA-C  fish oil-omega-3 fatty acids 1000 MG capsule Take 2 g by mouth in the morning and at bedtime.    [provider]  fluticasone (FLONASE) 50 MCG/ACT nasal spray Place 2 sprays into both nostrils daily as needed for allergies or rhinitis.  03/31/15   [provider]  glipiZIDE (GLUCOTROL) 10 MG tablet Take 10 mg by mouth 2 (two) times daily. 01/08/20   [provider]  hydrALAZINE (APRESOLINE) 25 MG tablet Take 1 tablet (25 mg total) by mouth 3 (three) times daily. 05/12/21 08/10/21  Nahser, Wonda Cheng, MD  hydroxypropyl methylcellulose / hypromellose (ISOPTO TEARS / GONIOVISC) 2.5 % ophthalmic solution Place 1 drop into both eyes 3 (three) times daily as needed for dry eyes.    [provider]  isosorbide mononitrate (IMDUR) 120 MG 24 hr tablet Take 120 mg by mouth every morning.     [provider]  isosorbide mononitrate (IMDUR) 30 MG 24 hr tablet Take 30 mg by mouth every evening.    [provider]  metFORMIN (GLUCOPHAGE-XR) 500 MG 24 hr tablet Take 500 mg by mouth daily. 08/29/20   [provider]  Multiple Vitamins-Minerals (ZINC PO) Take 1 tablet by mouth daily.    [provider]  nitroGLYCERIN (NITROSTAT) 0.4 MG SL tablet Place 1 tablet (0.4 mg total) under the tongue every 5 (five) minutes as needed for chest pain (x 3 doses). 11/11/20   Nahser, Wonda Cheng, MD  olmesartan (BENICAR) 40 MG tablet Take 40 mg by mouth daily. 08/22/20   [provider]  ranolazine (RANEXA) 500 MG 12 hr tablet Take 1 tablet (500 mg total) by mouth 2 (two) times daily. 05/12/21   Nahser, Wonda Cheng, MD  Study - [name] - inclisiran 300 mg/1.66mL or placebo SQ injection (PI-Stuckey) Inject 300 mg into the skin every 6 (six) months. 05/03/19   [provider]  TRESIBA FLEXTOUCH 100 UNIT/ML FlexTouch Pen Inject 30-34 Units into the skin daily. 09/10/19   [provider]  triamcinolone cream (KENALOG) 0.5 % Apply 1 application topically as needed for irritation. 08/24/17   [provider]    Allergies    Bempedoic acid, [name] [alirocumab], Rosuvastatin, Zetia [ezetimibe], and Keflex [cephalexin]  Review of Systems   Review of Systems  Constitutional:  Negative for fever.  Respiratory:  Negative for chest tightness and shortness of breath.   Cardiovascular:  Positive for chest pain.  Gastrointestinal:  Negative for abdominal pain, diarrhea, nausea and vomiting.  Musculoskeletal:  Positive for neck pain. Negative for neck stiffness.  All other systems reviewed and are negative.  Physical Exam Updated Vital Signs BP (!) 166/72   Pulse (!) 54   Temp 98.4 F (36.9 C) (Oral)   Resp 14   SpO2 99%   Physical Exam Vitals and nursing note reviewed.  Constitutional:      Appearance: He is well-developed. He is not  ill-appearing.  HENT:     Head: Normocephalic and atraumatic.  Eyes:     Pupils: Pupils are equal, round, and reactive  to light.  Neck:     Comments: Tenderness palpation left paraspinous muscle region of the cervical spine, no midline tenderness, step-off, deformity Cardiovascular:     Rate and Rhythm: Normal rate and regular rhythm.     Heart sounds: Normal heart sounds. No murmur heard. Pulmonary:     Effort: Pulmonary effort is normal. No respiratory distress.     Breath sounds: Normal breath sounds. No wheezing.  Abdominal:     General: Bowel sounds are normal.     Palpations: Abdomen is soft.     Tenderness: There is no abdominal tenderness. There is no rebound.  Musculoskeletal:     Cervical back: Normal range of motion and neck supple.     Right lower leg: No edema.     Left lower leg: No edema.  Lymphadenopathy:     Cervical: No cervical adenopathy.  Skin:    General: Skin is warm and dry.  Neurological:     Mental Status: He is alert and oriented to person, place, and time.    ED Results / Procedures / Treatments   Labs (all labs ordered are listed, but only abnormal results are displayed) Labs Reviewed  COMPREHENSIVE METABOLIC PANEL - Abnormal; Notable for the following components:      Result Value   Sodium 134 (*)    Glucose, Bld 230 (*)    BUN 25 (*)    Creatinine, Ser 1.37 (*)    GFR, Estimated 52 (*)    All other components within normal limits  TROPONIN I (HIGH SENSITIVITY) - Abnormal; Notable for the following components:   Troponin I (High Sensitivity) 22 (*)    All other components within normal limits  TROPONIN I (HIGH SENSITIVITY) - Abnormal; Notable for the following components:   Troponin I (High Sensitivity) 23 (*)    All other components within normal limits  CBC WITH DIFFERENTIAL/PLATELET    EKG EKG Interpretation  Date/Time:  Friday May 29 2021 21:47:47 EST Ventricular Rate:  63 PR Interval:  248 QRS Duration: 92 QT  Interval:  408 QTC Calculation: 417 R Axis:   -31 Text Interpretation: Sinus rhythm with 1st degree A-V block Left axis deviation Septal infarct , age undetermined Abnormal ECG  no significant changes Confirmed by Thayer Jew [id number]) on 05/30/2021 2:15:37 AM  Radiology DG Chest 2 View  Result Date: 05/30/2021 CLINICAL DATA:  Chest pain EXAM: CHEST - 2 VIEW COMPARISON:  10/26/2020 FINDINGS: Left basilar scarring. Right lung is clear. No pleural effusion or pneumothorax. The heart is normal in size. Mild degenerative changes of the lower thoracic spine. IMPRESSION: No evidence of acute cardiopulmonary disease. Electronically Signed   By: Julian Hy M.D.   On: 05/30/2021 00:03    Procedures Procedures   Medications Ordered in ED Medications  cyclobenzaprine (FLEXERIL) tablet 5 mg (has no administration in time range)    ED Course  I have reviewed the triage vital signs and the nursing notes.  Pertinent labs & imaging results that were available during my care of the patient were reviewed by me and considered in my medical decision making (see chart for details).    MDM Rules/Calculators/A&P                           Patient presents with neck pain and chest discomfort.  He is overall nontoxic and vital signs are notable for blood pressure 166/72.  Much of the patient's pain is described as musculoskeletal  and worse with movement.  There is no worsening with exertion.  While he has a history of coronary artery disease, he had stable disease on catheterization in April and his EKG shows no evidence of acute ischemia.  Troponin is 23 and 22 respectively.  He previously has had slightly elevated troponins in this and this is likely related to mild chronic kidney disease.  I have low suspicion for acute ACS at this time.  It does appear that hydralazine can cause symptoms such as muscle pain.  Patient was given Flexeril for his neck pain.  Chest x-ray without acute pneumothorax or  pneumonia.  Doubt dissection.  Recommend the patient follow-up with cardiology for alternative options.  After history, exam, and medical workup I feel the patient has been appropriately medically screened and is safe for discharge home. Pertinent diagnoses were discussed with the patient. Patient was given return precautions.   Final Clinical Impression(s) / ED Diagnoses Final diagnoses:  Musculoskeletal neck pain  Atypical chest pain    Rx / DC Orders ED Discharge Orders     None        Merryl Hacker, MD 05/30/21 [phone]

## 2021-05-30 NOTE — ED Notes (Signed)
Daughter states she is bringing him back because symptoms got worse once they left

## 2021-05-30 NOTE — Discharge Instructions (Addendum)
You were seen today for neck pain and chest discomfort.  Your chest pain work-up is overall reassuring.  Follow-up closely with Dr. Elease Hashimoto regarding alternative medications for your blood pressure.  It is unclear whether your hydralazine is the culprit medication; however, given correlation to symptoms, it is a consideration.

## 2021-06-01 ENCOUNTER — Telehealth: Payer: Self-pay | Admitting: Cardiovascular Disease

## 2021-06-01 NOTE — Telephone Encounter (Signed)
Joseph [name] is calling stating he was hospitalized 11/11. The hospital advised he call and speak with the nurse to discuss what happened.

## 2021-06-01 NOTE — Telephone Encounter (Signed)
Ranolazine will not lower BP so this is ok to continue taking. Ok to remove hydralazine from medicines and list intolerance to this drug. Rest and drink plenty of fluids today. Check BP once daily for 1 week and send/call in readings for review. Tereso Newcomer, PA-C    06/01/2021 12:06 PM

## 2021-06-01 NOTE — Telephone Encounter (Signed)
Spoke with Joseph [name] and advised of recommendations per Tereso Newcomer, PA-C as below.  Joseph [name] verbalizes understanding and agrees with current plan

## 2021-06-01 NOTE — Telephone Encounter (Signed)
Spoke with pt who states he was seen in the ED on 05/29/2021 for neck, back and chest pain.  Pt states he had previously stopped Hydralazine due to increased muscular pain after starting this medication and started back on 05/28/2021.  He stopped the Hydralazine again after his ED visit and does not plan on starting this medication back.  He has also stopped Ranolazine since ED visit and is not sure if he should restart this medication.  Pt advised Ranolazine was given for chest pain.  Pt states his BP has been running in the low 100's/60's.   Will forward information to Tereso Newcomer, PA-C and Dr Elease Hashimoto for review and recommendation.  Pt will continue to monitor BP.  Pt verbalizes understanding and agrees with current plan.

## 2021-06-19 ENCOUNTER — Telehealth: Payer: Self-pay

## 2021-06-19 DIAGNOSIS — Z006 Encounter for examination for normal comparison and control in clinical research program: Secondary | ICD-10-CM

## 2021-06-19 NOTE — Telephone Encounter (Signed)
Called and spoke to pt, I reminded him of his appointment on Monday 06/22/2021 at 8 AM for CORE study. I also informed the pt of the updated gate code for parking.

## 2021-06-22 ENCOUNTER — Other Ambulatory Visit: Payer: Self-pay

## 2021-06-22 DIAGNOSIS — Z006 Encounter for examination for normal comparison and control in clinical research program: Secondary | ICD-10-CM

## 2021-06-22 NOTE — Research (Signed)
Patient seen in clinic for follow up visit 15 month. Labs drawn and processed. IP given in RLQ without complaints nor complications. Patient denies any changes in medication or hospitalizations since last visit. Next follow up visit to be on June 5th @ 8 am.

## 2021-06-23 ENCOUNTER — Telehealth: Payer: Self-pay | Admitting: Pharmacist

## 2021-06-23 NOTE — Telephone Encounter (Signed)
Pt called clinic asking if his Plavix or Eliquis dose needs to be changed because he has noticed diarrhea and dark color of his stool. He did take Pepto Bismol about 4 days ago. Advised him this can make his stool dark. States he noticed it a bit before too. Also reports metamucil makes his stool turn either dark or gray. No other sx of bleeding. Advised pt to contact his PCP, can check stool sample for signs of blood. He verbalized understanding.

## 2021-06-29 DIAGNOSIS — R195 Other fecal abnormalities: Secondary | ICD-10-CM | POA: Insufficient documentation

## 2021-08-07 ENCOUNTER — Other Ambulatory Visit: Payer: Self-pay | Admitting: Cardiovascular Disease

## 2021-08-18 ENCOUNTER — Other Ambulatory Visit: Payer: Self-pay | Admitting: Physician Assistant

## 2021-09-07 ENCOUNTER — Telehealth: Payer: Self-pay | Admitting: Pharmacist

## 2021-09-07 NOTE — Telephone Encounter (Signed)
Pt's daughter called requesting pt assistance application for Eliquis. I have emailed this to her at [email or web address] She is aware that Medicare patients are not eligible for pt assistance coverage until they have spent 3% of their household income on out of pocket rx costs for the year.

## 2021-09-15 NOTE — Telephone Encounter (Signed)
Patient called requesting samples of Eliquis. He states it will cost him $500 to fill 30 days worth. Cant apply for pt assistance until he finishes his taxes. States he is always approved. I explained that he will need to spend 3% of his income on his OOP expenses in 2023 first before he will be approved. If he has not spent this, then next month he will be in the same boat. Explained that we cannot sample him over and over. Patient requests 2 weeks worth of samples and states he will be approved. I advised he call his insurance to make sure its a deductible. Will leave 2 weeks of samples up front.

## 2021-10-14 ENCOUNTER — Telehealth: Payer: Self-pay | Admitting: Pharmacist

## 2021-10-14 NOTE — Telephone Encounter (Signed)
Pt's daughter [name] emailed me BMS Eliquis pt assistance application. This has been faxed. ?

## 2021-10-26 NOTE — Telephone Encounter (Signed)
**Note De-Identified Joseph [name] Obfuscation** Per letter received from [facility] they have approved the pt for Eliquis assistance until 07/18/2022. ?[id number]. ? ?Per letter they have also notified the pt of this approval as well. ?

## 2021-10-28 ENCOUNTER — Other Ambulatory Visit: Payer: Self-pay | Admitting: Cardiovascular Disease

## 2021-12-10 DIAGNOSIS — D539 Nutritional anemia, unspecified: Secondary | ICD-10-CM | POA: Insufficient documentation

## 2021-12-18 DIAGNOSIS — I1310 Hypertensive heart and chronic kidney disease without heart failure, with stage 1 through stage 4 chronic kidney disease, or unspecified chronic kidney disease: Secondary | ICD-10-CM | POA: Insufficient documentation

## 2021-12-18 DIAGNOSIS — Z006 Encounter for examination for normal comparison and control in clinical research program: Secondary | ICD-10-CM

## 2021-12-18 NOTE — Research (Signed)
Called patient to remind him of his appointment on Monday June 5 @ 8am. Message was left on his voice mail with gate code and no labs due this visit

## 2021-12-21 DIAGNOSIS — Z006 Encounter for examination for normal comparison and control in clinical research program: Secondary | ICD-10-CM

## 2021-12-21 NOTE — Research (Signed)
Patient seen in the research clinic for Month 21 visit of the [name] trial. Reviewed medications with patient and no changes noted at this time. He denies any hospitalizations and adverse events since his last visit. No labs were due at this visit. IP was given @ 0807 in the right upper quadrant of the abdomen. Patient tolerated without complaints. Next appointment made for 06/21/22 @ 8 am.   Current Outpatient Medications:    acetaminophen (TYLENOL) 500 MG tablet, Take 1,000 mg by mouth as needed for moderate pain., Disp: , Rfl:    amLODipine (NORVASC) 10 MG tablet, Take 10 mg by mouth daily., Disp: , Rfl:    apixaban (ELIQUIS) 5 MG TABS tablet, Take 1 tablet (5 mg total) by mouth 2 (two) times daily., Disp: 180 tablet, Rfl: 1   carvedilol (COREG) 6.25 MG tablet, TAKE 1 TABLET BY MOUTH TWICE A DAY, Disp: 180 tablet, Rfl: 1   Cholecalciferol (VITAMIN D3) 25 MCG (1000 UT) CAPS, Take 1,000 Units by mouth daily., Disp: , Rfl:    clopidogrel (PLAVIX) 75 MG tablet, TAKE 1 TABLET BY MOUTH EVERY DAY, Disp: 90 tablet, Rfl: 3   diltiazem (CARDIZEM) 30 MG tablet, Take 1 tablet (30 mg total) by mouth as needed (Rapid Palpitations)., Disp: 30 tablet, Rfl: 5   fish oil-omega-3 fatty acids 1000 MG capsule, Take 2 g by mouth in the morning and at bedtime., Disp: , Rfl:    fluticasone (FLONASE) 50 MCG/ACT nasal spray, Place 2 sprays into both nostrils daily as needed for allergies or rhinitis. , Disp: , Rfl: 0   glipiZIDE (GLUCOTROL) 10 MG tablet, Take 10 mg by mouth 2 (two) times daily., Disp: , Rfl:    hydroxypropyl methylcellulose / hypromellose (ISOPTO TEARS / GONIOVISC) 2.5 % ophthalmic solution, Place 1 drop into both eyes 3 (three) times daily as needed for dry eyes., Disp: , Rfl:    isosorbide mononitrate (IMDUR) 120 MG 24 hr tablet, TAKE 1 TABLET BY MOUTH EVERY DAY, Disp: 90 tablet, Rfl: 3   isosorbide mononitrate (IMDUR) 30 MG 24 hr tablet, Take 30 mg by mouth every evening., Disp: , Rfl:    Multiple  Vitamins-Minerals (ZINC PO), Take 1 tablet by mouth daily., Disp: , Rfl:    nitroGLYCERIN (NITROSTAT) 0.4 MG SL tablet, Place 1 tablet (0.4 mg total) under the tongue every 5 (five) minutes as needed for chest pain (x 3 doses)., Disp: 25 tablet, Rfl: 3   olmesartan (BENICAR) 40 MG tablet, Take 40 mg by mouth daily., Disp: , Rfl:    Study - [name] - inclisiran 300 mg/1.18mL or placebo SQ injection (PI-Stuckey), Inject 300 mg into the skin every 6 (six) months., Disp: , Rfl:    TRESIBA FLEXTOUCH 100 UNIT/ML FlexTouch Pen, Inject 30-34 Units into the skin daily., Disp: , Rfl:    Coenzyme Q10 100 MG capsule, Take 100 mg by mouth daily. (Patient not taking: Reported on 12/21/2021), Disp: , Rfl:    metFORMIN (GLUCOPHAGE-XR) 500 MG 24 hr tablet, Take 500 mg by mouth daily. (Patient not taking: Reported on 12/21/2021), Disp: , Rfl:    ranolazine (RANEXA) 500 MG 12 hr tablet, Take 1 tablet (500 mg total) by mouth 2 (two) times daily. (Patient not taking: Reported on 12/21/2021), Disp: 180 tablet, Rfl: 3   triamcinolone cream (KENALOG) 0.5 %, Apply 1 application topically as needed for irritation. (Patient not taking: Reported on 12/21/2021), Disp: , Rfl:

## 2022-01-03 ENCOUNTER — Encounter: Payer: Self-pay | Admitting: Cardiovascular Disease

## 2022-01-03 NOTE — Progress Notes (Unsigned)
Cardiology Office Note:    Date:  01/04/2022   ID:  Joseph [name], Joseph [name] 12/03/1940, MRN [id number]  PCP:  Imagene Riches, NP  Cardiologist:  Mertie Moores, MD  Electrophysiologist:  None   Referring MD: Imagene Riches, NP   Chief Complaint  Patient presents with   Hypertension   Coronary Artery Disease          Feb. 17, 2020    Joseph [name] is a 81 y.o. male with a hx of coronary artery disease.  He is status post anterior wall myocardial infarction in 2004 treated with a stent to the LAD with a staged PCI to the right coronary artery.  Also had an non-ST segment elevation myocardial infarction in January 2016 treated treated with a DES to the mid LAD and a DES to the mid and distal circumflex artery. He also has a history of paroxysmal atrial fibrillation, hypertension, hyperlipidemia.  He is intolerant to statins.  He has a history of diabetes mellitus and a previous stroke.  He is a previous patient of Dr. Saunders Revel .  He was last seen in our office in July, 2019 by Richardson Dopp, PA.  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  Denies any chest pain. Rides a stationary bike - does not have any issues with that  Follows a low chol diet .   Has had a rash across his body for the past 2 years .     Might be related to the onset of the Eliquis .  He thinks the rash is worse since he started Coreg.   Oct. 5, 2020 Doing well.    No CP,  Works around the yard,  Hip pain limits his exercise.  Just got through building a work shop .   He brought brought blood work from his primary medical doctor.  Vitamin D level is 40.  Sodium is 140.  Potassium is 4.8.  BUN is 34.  Creatinine is 1.24.  Liver enzymes are normal.  Thyroid profiles are normal.  Total cholesterol is 173.  LDL is 106.  Triglycerides are 160.  HDL is 33.  Hemoglobin A1c is 7.6.  He has hyperlipidemia.  He is tried numerous statins and also has tried the PCSK9 inhibitors.  He did not tolerate any of these.  He also  has tried Zetia but did not tolerate it.  We will continue with diet and exercise.  Dec. 4, 2020 :  Joseph [name] is seen today in follow up for a recent hospitalization for unstable angina  Heart catheterization performed on June 11, 2019 reveals diffuse three-vessel coronary artery disease.  He was started on Ranexa 500 mg twice a day.  He was started on Plavix. No bleeding issues.  Developed some neck pain last night  Has occasional random sharp and dull pains in his chest   September 10, 2019: Joseph [name] is seen today for follow up of his 3 V cad.   Had PCI recently   He stopped taking Ranexa   Is taking imdur No CP .   Is fairly active  .   Walks on occasion    Aug. 23, 2021  Joseph [name] is seen today for follow up of his CAD;, PAF, PAD He was seen by Richardson Dopp, [place] in May . Cath in Oct. 2020 revealed diffuse 3 V cad with no targets for revascularization  His imdur was increased to 180 mg a day Still has occasional CP.  Not exertional  Associated with weakness.   Seems to be partially relieved with getting up and moving around  Is in the [place] study   November 11, 2020 Joseph [name] is seen for follow up of his CAD, PAF, PAD He was hospitalized several weeks ago for unstable angina Cath showed mod- severe CAD - was unchanged from previous cath . Recommended medical therapy He has CKD - scheduled to get a BMP today following cath  Seems to be doing well on medical therapy  Has been taking Imdur 120 mg in the am,  30 mg in the evening   June 19. 2023  Joseph [name] is seen for follow up of his CAD, PAF, PAD  No cp, no dyspnea  Not much exercise due to hip and back pain  No angina  He wants to have back surgery if possible  I think he could have back surgery from a cardiac standpoint Would be at low - moderate risk but he is at low risk as he ever will be first     Past Medical History:  Diagnosis Date   Abdominal discomfort    Abdominal discomfort: EGD 2011 showed no significant  abnormalities.  Possibly due to metformin.   Allergic rhinitis    Anticoagulated 05/30/2016   CAD (coronary artery disease)    a. s/p MI in 2004:  stents to the LAD and staged stent RCA, EF 55%;  b.  NSTEMI (1/16):  LHC - mid LAD stent with diff dist restenosis, prox to mid Dx mod dsz jailed by stent, small OM1 99, mCFX 75, dCFX 80, mRCA stent ok, mPDA mild to mod dsz, EF 55% >> PCI:  Synergy DES to LAD; Synergy DES (x2) mid and dist CFX   CAD in native artery 06/12/2019   Chronic kidney disease, stage 2, mildly decreased GFR    stage 2-3   DDD (degenerative disc disease)    L4 to S1 degenerative disease and spondylolisthesis: low back pain.  s/p back surgery   DM (diabetes mellitus) ([place])    History of CVA (cerebrovascular accident)    Small CVA seen by head CT (incidental) in 1/13.  Carotid dopplers in 1/13 showed minimal disease   History of Doppler ultrasound    Arterial dopplers (3/11): no evidence for significant PAD. ABIs 10/12: Normal.     HLD (hyperlipidemia)    HTN (hypertension)    had side effects with chlorthalidone   Hx of cardiovascular stress test    Myoview (1/13): Small fixed apical septal defect with no ischemia, EF 58%.   Hx of echocardiogram    Echo (3/11): EF 60%, normal wall motion, mild MR, mild left atrial enlargement, mildly dilated ascending aorta.   PAF (paroxysmal atrial fibrillation) ([place]) 05/29/2016   Spondylolisthesis    L4-S1   Statin intolerance     Past Surgical History:  Procedure Laterality Date   COLONOSCOPY N/A 12/26/2017   Procedure: COLONOSCOPY;  Surgeon: Yetta Flock, MD;  Location: [facility] ENDOSCOPY;  Service: Gastroenterology;  Laterality: N/A;   COLONOSCOPY W/ POLYPECTOMY  12/2017   CORONARY ANGIOGRAM  11/13/2014   Procedure: CORONARY ANGIOGRAM;  Surgeon: Larey Dresser, MD;  Location: [facility] CATH LAB;  Service: Cardiovascular;;   CORONARY STENT INTERVENTION N/A 05/18/2019   Procedure: CORONARY STENT INTERVENTION;  Surgeon: Leonie Man, MD;  Location: [place] CV LAB;  Service: Cardiovascular;  Laterality: N/A;   CORONARY STENT PLACEMENT  2004   x2 LAD and RCA    LEFT HEART CATH AND CORONARY ANGIOGRAPHY  N/A 05/18/2019   Procedure: LEFT HEART CATH AND CORONARY ANGIOGRAPHY;  Surgeon: Leonie Man, MD;  Location: [place] CV LAB;  Service: Cardiovascular;  Laterality: N/A;   LEFT HEART CATH AND CORONARY ANGIOGRAPHY N/A 06/11/2019   Procedure: LEFT HEART CATH AND CORONARY ANGIOGRAPHY;  Surgeon: Nelva Bush, MD;  Location: [place] CV LAB;  Service: Cardiovascular;  Laterality: N/A;   LEFT HEART CATH AND CORONARY ANGIOGRAPHY N/A 10/27/2020   Procedure: LEFT HEART CATH AND CORONARY ANGIOGRAPHY;  Surgeon: Troy Sine, MD;  Location: [place] CV LAB;  Service: Cardiovascular;  Laterality: N/A;   LEFT HEART CATHETERIZATION WITH CORONARY ANGIOGRAM N/A 08/02/2014   Procedure: LEFT HEART CATHETERIZATION WITH CORONARY ANGIOGRAM;  Surgeon: Jettie Booze, MD;  Location: [facility] CATH LAB;  Service: Cardiovascular;  Laterality: N/A;   PROSTATE SURGERY     SPINAL FUSION     L4-S1    Current Medications: Current Meds  Medication Sig   acetaminophen (TYLENOL) 500 MG tablet Take 1,000 mg by mouth as needed for moderate pain.   amLODipine (NORVASC) 10 MG tablet Take 10 mg by mouth daily.   apixaban (ELIQUIS) 5 MG TABS tablet Take 1 tablet (5 mg total) by mouth 2 (two) times daily.   carvedilol (COREG) 6.25 MG tablet TAKE 1 TABLET BY MOUTH TWICE A DAY   Cholecalciferol (VITAMIN D3) 25 MCG (1000 UT) CAPS Take 1,000 Units by mouth daily.   clopidogrel (PLAVIX) 75 MG tablet TAKE 1 TABLET BY MOUTH EVERY DAY   Coenzyme Q10 100 MG capsule Take 100 mg by mouth daily.   diltiazem (CARDIZEM) 30 MG tablet Take 1 tablet (30 mg total) by mouth as needed (Rapid Palpitations).   fish oil-omega-3 fatty acids 1000 MG capsule Take 2 g by mouth in the morning and at bedtime.   fluticasone (FLONASE) 50 MCG/ACT nasal spray Place 2  sprays into both nostrils daily as needed for allergies or rhinitis.    glipiZIDE (GLUCOTROL) 10 MG tablet Take 10 mg by mouth 2 (two) times daily.   hydroxypropyl methylcellulose / hypromellose (ISOPTO TEARS / GONIOVISC) 2.5 % ophthalmic solution Place 1 drop into both eyes 3 (three) times daily as needed for dry eyes.   isosorbide mononitrate (IMDUR) 120 MG 24 hr tablet TAKE 1 TABLET BY MOUTH EVERY DAY   isosorbide mononitrate (IMDUR) 30 MG 24 hr tablet Take 30 mg by mouth every evening.   Multiple Vitamins-Minerals (ZINC PO) Take 1 tablet by mouth daily.   nitroGLYCERIN (NITROSTAT) 0.4 MG SL tablet Place 1 tablet (0.4 mg total) under the tongue every 5 (five) minutes as needed for chest pain (x 3 doses).   olmesartan (BENICAR) 40 MG tablet Take 40 mg by mouth daily.   Study - [name] - inclisiran 300 mg/1.19mL or placebo SQ injection (PI-Stuckey) Inject 300 mg into the skin every 6 (six) months.   TRESIBA FLEXTOUCH 100 UNIT/ML FlexTouch Pen Inject 30-34 Units into the skin daily.   triamcinolone cream (KENALOG) 0.5 % Apply 1 application  topically as needed for irritation.     Allergies:   Bempedoic acid, [name] [alirocumab], Rosuvastatin, Zetia [ezetimibe], Hydralazine, and Keflex [cephalexin]   Social History   Socioeconomic History   Marital status: Divorced    Spouse name: Not on file   Number of children: 2   Years of education: Not on file   Highest education level: Not on file  Occupational History   Occupation: retired  Tobacco Use   Smoking status: Never  Smokeless tobacco: Never  Vaping Use   Vaping Use: Never used  Substance and Sexual Activity   Alcohol use: No   Drug use: No   Sexual activity: Not Currently  Other Topics Concern   Not on file  Social History Narrative   Not on file   Social Determinants of Health   Financial Resource Strain: Not on file  Food Insecurity: Not on file  Transportation Needs: Not on file  Physical Activity: Not on file   Stress: Not on file  Social Connections: Not on file     Family History: The patient's family history includes Heart attack in his father and mother. There is no history of Stroke.  ROS:   Please see the history of present illness.     All other systems reviewed and are negative.  EKGs/Labs/Other Studies Reviewed:    The following studies were reviewed today:   . Recent Labs: 05/29/2021: ALT 24; BUN 25; Creatinine, Ser 1.37; Hemoglobin 13.5; Platelets 264; Potassium 4.7; Sodium 134  Recent Lipid Panel    Component Value Date/Time   CHOL 174 05/18/2019 0210   CHOL 114 07/21/2018 0747   TRIG 136 05/18/2019 0210   HDL 32 (L) 05/18/2019 0210   HDL 38 (L) 07/21/2018 0747   CHOLHDL 5.4 05/18/2019 0210   VLDL 27 05/18/2019 0210   LDLCALC 115 (H) 05/18/2019 0210   LDLCALC 50 07/21/2018 0747   LDLDIRECT 83.0 10/28/2014 1622     ECG:     ASSESSMENT:    1. Coronary artery disease of native artery of native heart with stable angina pectoris (HCC)   2. Paroxysmal atrial fibrillation (HCC)   3. Essential hypertension    PLAN:    :  1.  Coronary artery disease:  Cath from Nov. 23, 2020 shows moderate  diffuse 3 V .    He needs to have back surgery . He has known CAD but has not had any angina  He is at low - moderate risk for back surgery  He may hold his eliquis for 2-3 days prior to surgery .    2.  Paroxysmal atrial fibrillation:   cont eliquis  Remains in NSR   3.  Hyperlipidemia:  Check lipids today   .  [name]      Medication Adjustments/Labs and Tests Ordered: Current medicines are reviewed at length with the patient today.  Concerns regarding medicines are outlined above.  Orders Placed This Encounter  Procedures   ALT   Lipid panel   No orders of the defined types were placed in this encounter.   Patient Instructions  Medication Instructions:  Your physician recommends that you continue on your current medications as directed. Please refer to the  Current Medication list given to you today.  *If you need a refill on your cardiac medications before your next appointment, please call your pharmacy*   Lab Work: LIPIDS, ALT Today If you have labs (blood work) drawn today and your tests are completely normal, you will receive your results only by: MyChart Message (if you have MyChart) OR A paper copy in the mail If you have any lab test that is abnormal or we need to change your treatment, we will call you to review the results.   Testing/Procedures: NONE   Follow-Up: At [facility], you and your health needs are our priority.  As part of our continuing mission to provide you with exceptional heart care, we have created designated Provider Care Teams.  These Care  Teams include your primary Cardiologist (physician) and Advanced Practice Providers (APPs -  Physician Assistants and Nurse Practitioners) who all work together to provide you with the care you need, when you need it.  We recommend signing up for the patient portal called "MyChart".  Sign up information is provided on this After Visit Summary.  MyChart is used to connect with patients for Virtual Visits (Telemedicine).  Patients are able to view lab/test results, encounter notes, upcoming appointments, etc.  Non-urgent messages can be sent to your provider as well.   To learn more about what you can do with MyChart, go to [email or web address].    Your next appointment:   1 year(s)  The format for your next appointment:   In Person  Provider:   Suzzanne Cloud or Krystel Fletchall {  Important Information About Sugar         Signed, Kristeen Miss, MD  01/04/2022 2:19 PM    [facility]

## 2022-01-04 ENCOUNTER — Encounter: Payer: Self-pay | Admitting: Cardiovascular Disease

## 2022-01-04 ENCOUNTER — Ambulatory Visit (INDEPENDENT_AMBULATORY_CARE_PROVIDER_SITE_OTHER): Payer: Medicare Other | Admitting: Cardiovascular Disease

## 2022-01-04 VITALS — BP 132/74 | HR 58 | Ht 74.0 in | Wt 217.4 lb

## 2022-01-04 DIAGNOSIS — I10 Essential (primary) hypertension: Secondary | ICD-10-CM

## 2022-01-04 DIAGNOSIS — I25118 Atherosclerotic heart disease of native coronary artery with other forms of angina pectoris: Secondary | ICD-10-CM

## 2022-01-04 DIAGNOSIS — I480 Paroxysmal atrial fibrillation: Secondary | ICD-10-CM

## 2022-01-04 LAB — LIPID PANEL
Chol/HDL Ratio: 5.4 ratio — ABNORMAL HIGH (ref 0.0–5.0)
Cholesterol, Total: 156 mg/dL (ref 100–199)
HDL: 29 mg/dL — ABNORMAL LOW (ref >39)
LDL Chol Calc (NIH): 95 mg/dL (ref 0–99)
Triglycerides: 184 mg/dL — ABNORMAL HIGH (ref 0–149)
VLDL Cholesterol Cal: 32 mg/dL (ref 5–40)

## 2022-01-04 LAB — ALT: ALT: 19 IU/L (ref 0–44)

## 2022-01-04 NOTE — Patient Instructions (Signed)
Medication Instructions:  Your physician recommends that you continue on your current medications as directed. Please refer to the Current Medication list given to you today.  *If you need a refill on your cardiac medications before your next appointment, please call your pharmacy*   Lab Work: LIPIDS, ALT Today If you have labs (blood work) drawn today and your tests are completely normal, you will receive your results only by: MyChart Message (if you have MyChart) OR A paper copy in the mail If you have any lab test that is abnormal or we need to change your treatment, we will call you to review the results.   Testing/Procedures: NONE   Follow-Up: At [facility], you and your health needs are our priority.  As part of our continuing mission to provide you with exceptional heart care, we have created designated Provider Care Teams.  These Care Teams include your primary Cardiologist (physician) and Advanced Practice Providers (APPs -  Physician Assistants and Nurse Practitioners) who all work together to provide you with the care you need, when you need it.  We recommend signing up for the patient portal called "MyChart".  Sign up information is provided on this After Visit Summary.  MyChart is used to connect with patients for Virtual Visits (Telemedicine).  Patients are able to view lab/test results, encounter notes, upcoming appointments, etc.  Non-urgent messages can be sent to your provider as well.   To learn more about what you can do with MyChart, go to [email or web address].    Your next appointment:   1 year(s)  The format for your next appointment:   In Person  Provider:   Suzzanne Cloud or [place] {  Important Information About Sugar

## 2022-03-30 DIAGNOSIS — R839 Unspecified abnormal finding in cerebrospinal fluid: Secondary | ICD-10-CM | POA: Insufficient documentation

## 2022-05-07 ENCOUNTER — Other Ambulatory Visit: Payer: Self-pay | Admitting: Cardiovascular Disease

## 2022-06-21 ENCOUNTER — Encounter: Payer: Self-pay | Admitting: *Deleted

## 2022-06-21 DIAGNOSIS — Z006 Encounter for examination for normal comparison and control in clinical research program: Secondary | ICD-10-CM

## 2022-06-21 NOTE — Research (Signed)
Left message for patient to call back to reschedule his appt for orion 4 trial  He will need to be fasting for blood work

## 2022-06-24 ENCOUNTER — Other Ambulatory Visit: Payer: Self-pay

## 2022-06-24 ENCOUNTER — Encounter: Payer: Medicare Other | Admitting: *Deleted

## 2022-06-24 VITALS — BP 164/74 | HR 65 | Temp 97.3°F | Resp 20

## 2022-06-24 DIAGNOSIS — Z006 Encounter for examination for normal comparison and control in clinical research program: Secondary | ICD-10-CM

## 2022-06-24 MED ORDER — STUDY - ORION 4 - INCLISIRAN 300 MG/1.5 ML OR PLACEBO SQ INJECTION (PI-STUCKEY)
300.0000 mg | INJECTION | SUBCUTANEOUS | Status: DC
  Filled 2022-06-24: qty 1.5

## 2022-06-24 NOTE — Research (Signed)
Joseph [name] here for  Month 27 visit of Joseph [name]. He reports no A/E's. No visits to the Ed or Urgent care. VS taken at 0814. Labs drawn at 0835. Injection given at 0912 in right lower abd. Tol well.   Current Outpatient Medications:    acetaminophen (TYLENOL) 500 MG tablet, Take 1,000 mg by mouth as needed for moderate pain., Disp: , Rfl:    amLODipine (NORVASC) 10 MG tablet, Take 10 mg by mouth daily., Disp: , Rfl:    apixaban (ELIQUIS) 5 MG TABS tablet, Take 1 tablet (5 mg total) by mouth 2 (two) times daily., Disp: 180 tablet, Rfl: 1   carvedilol (COREG) 6.25 MG tablet, TAKE 1 TABLET BY MOUTH TWICE A DAY, Disp: 180 tablet, Rfl: 1   Cholecalciferol (VITAMIN D3) 25 MCG (1000 UT) CAPS, Take 1,000 Units by mouth daily., Disp: , Rfl:    clopidogrel (PLAVIX) 75 MG tablet, TAKE 1 TABLET BY MOUTH EVERY DAY, Disp: 90 tablet, Rfl: 3   Coenzyme Q10 100 MG capsule, Take 100 mg by mouth daily., Disp: , Rfl:    diltiazem (CARDIZEM) 30 MG tablet, Take 1 tablet (30 mg total) by mouth as needed (Rapid Palpitations)., Disp: 30 tablet, Rfl: 5   fish oil-omega-3 fatty acids 1000 MG capsule, Take 2 g by mouth in the morning and at bedtime., Disp: , Rfl:    fluticasone (FLONASE) 50 MCG/ACT nasal spray, Place 2 sprays into both nostrils daily as needed for allergies or rhinitis. , Disp: , Rfl: 0   glipiZIDE (GLUCOTROL) 10 MG tablet, Take 10 mg by mouth 2 (two) times daily., Disp: , Rfl:    hydroxypropyl methylcellulose / hypromellose (ISOPTO TEARS / GONIOVISC) 2.5 % ophthalmic solution, Place 1 drop into both eyes 3 (three) times daily as needed for dry eyes., Disp: , Rfl:    isosorbide mononitrate (IMDUR) 120 MG 24 hr tablet, TAKE 1 TABLET BY MOUTH EVERY DAY, Disp: 90 tablet, Rfl: 3   isosorbide mononitrate (IMDUR) 30 MG 24 hr tablet, Take 30 mg by mouth every evening., Disp: , Rfl:    Multiple Vitamins-Minerals (ZINC PO), Take 1 tablet by mouth daily., Disp: , Rfl:    nitroGLYCERIN (NITROSTAT) 0.[name] MG SL tablet, PLACE 1  TAB UNDER THE TONGUE EVERY 5 MINS AS NEEDED FOR CHEST PAIN FOR UP TO 3 DOSES, Disp: 25 tablet, Rfl: 3   olmesartan (BENICAR) 40 MG tablet, Take 40 mg by mouth daily., Disp: , Rfl:    ranolazine (RANEXA) 500 MG 12 hr tablet, Take 1 tablet (500 mg total) by mouth 2 (two) times daily., Disp: 180 tablet, Rfl: 3   Study - Joseph [name] - inclisiran 300 mg/1.18mL or placebo SQ injection (PI-Stuckey), Inject 300 mg into the skin every 6 (six) months., Disp: , Rfl:    TRESIBA FLEXTOUCH 100 UNIT/ML FlexTouch Pen, Inject 30-34 Units into the skin daily., Disp: , Rfl:    triamcinolone cream (KENALOG) 0.5 %, Apply 1 application  topically as needed for irritation., Disp: , Rfl:    metFORMIN (GLUCOPHAGE-XR) 500 MG 24 hr tablet, Take 500 mg by mouth daily. (Patient not taking: Reported on 12/21/2021), Disp: , Rfl:   Current Facility-Administered Medications:    Study - Joseph [name] - inclisiran 300 mg/1.31mL or placebo SQ injection (PI-Stuckey), 300 mg, Subcutaneous, Q6 months, Riley Kill, Arturo Morton, MD

## 2022-06-28 ENCOUNTER — Other Ambulatory Visit: Payer: Self-pay | Admitting: Cardiovascular Disease

## 2022-08-09 ENCOUNTER — Other Ambulatory Visit: Payer: Self-pay | Admitting: Cardiovascular Disease

## 2022-09-08 LAB — LAB REPORT - SCANNED
A1c: 8.4
Creatinine, POC: 77.3 mg/dL
EGFR: 59.7
Microalb Creat Ratio: 201.8

## 2022-09-29 DIAGNOSIS — S61409A Unspecified open wound of unspecified hand, initial encounter: Secondary | ICD-10-CM | POA: Insufficient documentation

## 2022-10-04 DIAGNOSIS — W010XXD Fall on same level from slipping, tripping and stumbling without subsequent striking against object, subsequent encounter: Secondary | ICD-10-CM | POA: Insufficient documentation

## 2022-10-28 ENCOUNTER — Other Ambulatory Visit: Payer: Self-pay | Admitting: Cardiovascular Disease

## 2022-11-05 ENCOUNTER — Telehealth: Payer: Self-pay | Admitting: Cardiovascular Disease

## 2022-11-05 NOTE — Telephone Encounter (Signed)
Patients daughter came and dropped off patient assistant forms for her dad. Forms copied and placed in office

## 2022-11-09 NOTE — Telephone Encounter (Signed)
Forms signed by MD and faxed to pt assistance.

## 2022-11-22 NOTE — Telephone Encounter (Signed)
**Note De-Identified [place] Obfuscation** Per letter received from [facility] Idamae Coccia fax, I called them and advised Joseph [name] that the shipping preference for the ps Eliquis is his home address. Per Joseph [name], that is all they need and will now complete the application process. [id number]

## 2022-12-09 ENCOUNTER — Other Ambulatory Visit: Payer: Self-pay | Admitting: Pharmacist

## 2022-12-09 MED ORDER — APIXABAN 5 MG PO TABS: 5.0000 mg | ORAL_TABLET | Freq: Two times a day (BID) | ORAL | 5 refills | Status: DC

## 2022-12-16 DIAGNOSIS — R002 Palpitations: Secondary | ICD-10-CM | POA: Insufficient documentation

## 2022-12-16 MED ORDER — STUDY - ORION 4 - INCLISIRAN 300 MG/1.5 ML OR PLACEBO SQ INJECTION (PI-STUCKEY): 300.0000 mg | INJECTION | SUBCUTANEOUS | Status: DC

## 2022-12-16 NOTE — Telephone Encounter (Signed)
**Note De-Identified Cheryl Chay Obfuscation** Letter received from [facility] Kim Oki fax stating that they have approved the pt for Eliquis assistance until 07/19/2023. [id number]  The letter states that they have notified the pt of this approval as well.

## 2022-12-21 ENCOUNTER — Encounter: Payer: Medicare Other | Admitting: *Deleted

## 2022-12-21 DIAGNOSIS — Z006 Encounter for examination for normal comparison and control in clinical research program: Secondary | ICD-10-CM

## 2022-12-21 DIAGNOSIS — Z6828 Body mass index (BMI) 28.0-28.9, adult: Secondary | ICD-10-CM | POA: Insufficient documentation

## 2022-12-21 NOTE — Progress Notes (Signed)
Patient was in today to continue in [place] study, the phase 3 randomized outcomes trial for Inclisiran.  He sees Dr. Elease Hashimoto, and there has been prior discussion regarding remaining in the study versus commercial Inclisiran.  Patient has noted some palpitations, and some mild upper chest discomfort, and this has been intermittent in recent days.  He had a monitor placed by his primary MD, and is scheduled to see Dr. Elease Hashimoto next month.  The discomfort has been more at rest, but not exertion related.  He is scheduled to see his primary MD this afternoon.  The patient is on anticoagulation (Eliquis), clopidogrel monotherapy, and multiple antianginals and Ranexa.    VSS are as recorded.  ECG noted _ NSR with 1 degree av block.  APCs.  Leftward axis.  Poor R wave progression consistent with prior Anterior MI.  Last cath demonstrated the following.  This was unchanged from 2020.   3rd Diag lesion is 25% stenosed. 2nd Diag lesion is 80% stenosed. Prox LAD to Mid LAD lesion is 40% stenosed. Mid LAD to Dist LAD lesion is 30% stenosed with 70% stenosed side branch in 2nd Diag. 2nd Mrg lesion is 90% stenosed. Non-stenotic RPDA lesion was previously treated. Non-stenotic Dist RCA lesion with 0% stenosed side branch in RPDA was previously treated. Prox RCA to Mid RCA lesion is 5% stenosed. Ramus lesion is 100% stenosed. Dist LM to Ost LAD lesion is 50% stenosed. Previously placed Prox Cx to Mid Cx stent (unknown type) is widely patent. Previously placed LPAV stent (unknown type) is widely patent. Mid RCA to Dist RCA lesion is 35% stenosed. 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 LV end diastolic pressure is low.  He does not appear to be unstable, but should be see soon to consider possible further evaluation.  We were unable to get him today, but will see Dr. Anne Fu tomorrow, and I have spoken with him.  They can also address whether to switch him to active therapy, and the research  clinic will remain blinded as per the [facility] protocol.  I have spoken to Dr. Anne Fu about him, and he is aware.  The patient should call with any change in status.   Joseph [name]. Riley Kill, MD, [facility] Medical Director, [facility]

## 2022-12-21 NOTE — Research (Signed)
Joseph [name]   Patient here today for injection.  No labs drawn today.   Patient told me that he has been having chest flutters, discomfort in lower throat area, weak, and feels funny. Started a week or two ago. Patient is wearing a monitor from his PCP which he has follow up today.  EKG today   Dr Riley Kill to see patient  Riley Kill requested for patient to see [address] DOD  Appt made for 12/22/22 @ 1030 with DOD   Didn't give injection today - Dr Riley Kill wants to hold injection until seen by cardiologist. We will follow up after his appt tomorrow, to reschedule his appt.

## 2022-12-22 ENCOUNTER — Encounter: Payer: Medicare Other | Admitting: *Deleted

## 2022-12-22 ENCOUNTER — Encounter: Payer: Self-pay | Admitting: Cardiology

## 2022-12-22 ENCOUNTER — Ambulatory Visit: Payer: Medicare Other | Attending: Cardiology | Admitting: Cardiology

## 2022-12-22 VITALS — BP 136/74 | HR 57 | Ht 74.0 in | Wt 225.9 lb

## 2022-12-22 DIAGNOSIS — R0789 Other chest pain: Secondary | ICD-10-CM | POA: Diagnosis not present

## 2022-12-22 DIAGNOSIS — I25118 Atherosclerotic heart disease of native coronary artery with other forms of angina pectoris: Secondary | ICD-10-CM | POA: Insufficient documentation

## 2022-12-22 DIAGNOSIS — Z006 Encounter for examination for normal comparison and control in clinical research program: Secondary | ICD-10-CM

## 2022-12-22 LAB — LAB REPORT - SCANNED
A1c: 7.5
EGFR: 50.8

## 2022-12-22 MED ORDER — STUDY - ORION 4 - INCLISIRAN 300 MG/1.5 ML OR PLACEBO SQ INJECTION (PI-STUCKEY)
300.0000 mg | INJECTION | SUBCUTANEOUS | Status: DC
  Administered 2022-12-22: 300 mg via SUBCUTANEOUS
  Filled 2022-12-22: qty 1.5

## 2022-12-22 NOTE — Patient Instructions (Signed)
Medication Instructions:  Your physician recommends that you continue on your current medications as directed. Please refer to the Current Medication list given to you today.  *If you need a refill on your cardiac medications before your next appointment, please call your pharmacy*  Lab Work: None ordered If you have labs (blood work) drawn today and your tests are completely normal, you will receive your results only by: MyChart Message (if you have MyChart) OR A paper copy in the mail If you have any lab test that is abnormal or we need to change your treatment, we will call you to review the results.  Testing/Procedures: Your physician has requested that you have a lexiscan myoview. For further information please visit [email or web address] Please follow instruction sheet, as given.   Follow-Up: At [facility], you and your health needs are our priority.  As part of our continuing mission to provide you with exceptional heart care, we have created designated Provider Care Teams.  These Care Teams include your primary Cardiologist (physician) and Advanced Practice Providers (APPs -  Physician Assistants and Nurse Practitioners) who all work together to provide you with the care you need, when you need it.  Your next appointment:   1 month(s) on 02/01/23 at 1:20 PM  The format for your next appointment:   In Person  Provider:   Kristeen Miss, MD {  Other Instructions Instructions for Lexiscan Myoview (Stress Test)  Please arrive 15 minutes prior to your appointment time for registration and insurance purposes.  The test will take approximately 3 to 4 hours to complete; you may bring reading material.  If someone comes with you to your appointment, they will need to remain in the main lobby due to limited space in the testing area. **If you are pregnant or breastfeeding, please notify the nuclear lab prior to your appointment**  How to prepare for your Myocardial Perfusion  Test: Do not eat or drink 3 hours prior to your test, except you may have water. Do not consume products containing caffeine (regular or decaffeinated) 12 hours prior to your test. (ex: coffee, chocolate, sodas, tea). Do bring a list of your current medications with you.  If not listed below, you may take your medications as normal. Do wear comfortable clothes (no dresses or overalls) and walking shoes, tennis shoes preferred (No heels or open toe shoes are allowed). Do NOT wear cologne, perfume, aftershave, or lotions (deodorant is allowed). If these instructions are not followed, your test will have to be rescheduled.

## 2022-12-22 NOTE — Progress Notes (Signed)
Cardiology Office Note:    Date:  12/22/2022   ID:  Joseph [name], DOB 01/26/1941, MRN [id number]  PCP:  Erskine Emery, NP   [facility] Providers Cardiologist:  Kristeen Miss, MD     Referring MD: Erskine Emery, NP    History of Present Illness:    Joseph [name] is a 82 y.o. male here for the evaluation of palpitations as well as chest pain with known coronary artery disease prior stent placement as described below.  He is currently participating in the [place] 4 study with inclisiran.  Funny feeling in chest. Had burning in mid chest. GERD? PPI has helped somewhat. Feels real weak at times, bad arthritis in bed. Feels heart skipping when taking BP. After he left yesterday felt better.   He is status post anterior wall myocardial infarction in 2004 treated with a stent to the LAD with a staged PCI to the right coronary artery.  Also had an non-ST segment elevation myocardial infarction in January 2016 treated treated with a DES to the mid LAD and a DES to the mid and distal circumflex artery. He also has a history of paroxysmal atrial fibrillation, hypertension, hyperlipidemia.  He is intolerant to statins.  He has a history of diabetes mellitus and a previous stroke.  Past Medical History:  Diagnosis Date   Abdominal discomfort    Abdominal discomfort: EGD 2011 showed no significant abnormalities.  Possibly due to metformin.   Allergic rhinitis    Anticoagulated 05/30/2016   CAD (coronary artery disease)    a. s/p MI in 2004:  stents to the LAD and staged stent RCA, EF 55%;  b.  NSTEMI (1/16):  LHC - mid LAD stent with diff dist restenosis, prox to mid Dx mod dsz jailed by stent, small OM1 99, mCFX 75, dCFX 80, mRCA stent ok, mPDA mild to mod dsz, EF 55% >> PCI:  Synergy DES to LAD; Synergy DES (x2) mid and dist CFX   CAD in native artery 06/12/2019   Chronic kidney disease, stage 2, mildly decreased GFR    stage 2-3   DDD (degenerative disc disease)    L4 to S1  degenerative disease and spondylolisthesis: low back pain.  s/p back surgery   DM (diabetes mellitus) (HCC)    History of CVA (cerebrovascular accident)    Small CVA seen by head CT (incidental) in 1/13.  Carotid dopplers in 1/13 showed minimal disease   History of Doppler ultrasound    Arterial dopplers (3/11): no evidence for significant PAD. ABIs 10/12: Normal.     HLD (hyperlipidemia)    HTN (hypertension)    had side effects with chlorthalidone   Hx of cardiovascular stress test    Myoview (1/13): Small fixed apical septal defect with no ischemia, EF 58%.   Hx of echocardiogram    Echo (3/11): EF 60%, normal wall motion, mild MR, mild left atrial enlargement, mildly dilated ascending aorta.   PAF (paroxysmal atrial fibrillation) (HCC) 05/29/2016   Spondylolisthesis    L4-S1   Statin intolerance     Past Surgical History:  Procedure Laterality Date   COLONOSCOPY N/A 12/26/2017   Procedure: COLONOSCOPY;  Surgeon: Benancio Deeds, MD;  Location: [facility] ENDOSCOPY;  Service: Gastroenterology;  Laterality: N/A;   COLONOSCOPY W/ POLYPECTOMY  12/2017   CORONARY ANGIOGRAM  11/13/2014   Procedure: CORONARY ANGIOGRAM;  Surgeon: Laurey Morale, MD;  Location: [facility] CATH LAB;  Service: Cardiovascular;;   CORONARY STENT INTERVENTION N/A 05/18/2019  Procedure: CORONARY STENT INTERVENTION;  Surgeon: Marykay Lex, MD;  Location: [facility] INVASIVE CV LAB;  Service: Cardiovascular;  Laterality: N/A;   CORONARY STENT PLACEMENT  2004   x2 LAD and RCA    LEFT HEART CATH AND CORONARY ANGIOGRAPHY N/A 05/18/2019   Procedure: LEFT HEART CATH AND CORONARY ANGIOGRAPHY;  Surgeon: Marykay Lex, MD;  Location: [facility] INVASIVE CV LAB;  Service: Cardiovascular;  Laterality: N/A;   LEFT HEART CATH AND CORONARY ANGIOGRAPHY N/A 06/11/2019   Procedure: LEFT HEART CATH AND CORONARY ANGIOGRAPHY;  Surgeon: Yvonne Kendall, MD;  Location: [facility] INVASIVE CV LAB;  Service: Cardiovascular;  Laterality: N/A;   LEFT HEART CATH  AND CORONARY ANGIOGRAPHY N/A 10/27/2020   Procedure: LEFT HEART CATH AND CORONARY ANGIOGRAPHY;  Surgeon: Lennette Bihari, MD;  Location: [facility] INVASIVE CV LAB;  Service: Cardiovascular;  Laterality: N/A;   LEFT HEART CATHETERIZATION WITH CORONARY ANGIOGRAM N/A 08/02/2014   Procedure: LEFT HEART CATHETERIZATION WITH CORONARY ANGIOGRAM;  Surgeon: Corky Crafts, MD;  Location: [facility] CATH LAB;  Service: Cardiovascular;  Laterality: N/A;   PROSTATE SURGERY     SPINAL FUSION     L4-S1    Current Medications: Current Meds  Medication Sig   acetaminophen (TYLENOL) 500 MG tablet Take 1,000 mg by mouth as needed for moderate pain.   amLODipine (NORVASC) 10 MG tablet Take 10 mg by mouth daily.   apixaban (ELIQUIS) 5 MG TABS tablet Take 1 tablet (5 mg total) by mouth 2 (two) times daily.   carvedilol (COREG) 6.25 MG tablet TAKE 1 TABLET BY MOUTH TWICE A DAY   Cholecalciferol (VITAMIN D3) 25 MCG (1000 UT) CAPS Take 1,000 Units by mouth daily.   clopidogrel (PLAVIX) 75 MG tablet TAKE 1 TABLET BY MOUTH EVERY DAY   diltiazem (CARDIZEM) 30 MG tablet Take 1 tablet (30 mg total) by mouth as needed (Rapid Palpitations).   fish oil-omega-3 fatty acids 1000 MG capsule Take 2 g by mouth in the morning and at bedtime.   fluticasone (FLONASE) 50 MCG/ACT nasal spray Place 2 sprays into both nostrils daily as needed for allergies or rhinitis.    glipiZIDE (GLUCOTROL) 10 MG tablet Take 10 mg by mouth 2 (two) times daily.   isosorbide mononitrate (IMDUR) 120 MG 24 hr tablet Take 1 tablet (120 mg total) by mouth daily. Please keep scheduled appointment for future refills. Thank you.   isosorbide mononitrate (IMDUR) 30 MG 24 hr tablet Take 30 mg by mouth every evening.   Multiple Vitamins-Minerals (ZINC PO) Take 1 tablet by mouth daily.   nitroGLYCERIN (NITROSTAT) 0.4 MG SL tablet PLACE 1 TAB UNDER THE TONGUE EVERY 5 MINS AS NEEDED FOR CHEST PAIN FOR UP TO 3 DOSES   olmesartan (BENICAR) 40 MG tablet Take 40 mg by mouth  daily.   TRESIBA FLEXTOUCH 100 UNIT/ML FlexTouch Pen Inject 30-34 Units into the skin daily.   triamcinolone cream (KENALOG) 0.5 % Apply 1 application  topically as needed for irritation.   [DISCONTINUED] Coenzyme Q10 100 MG capsule Take 100 mg by mouth daily.   [DISCONTINUED] hydroxypropyl methylcellulose / hypromellose (ISOPTO TEARS / GONIOVISC) 2.5 % ophthalmic solution Place 1 drop into both eyes 3 (three) times daily as needed for dry eyes.   [DISCONTINUED] metFORMIN (GLUCOPHAGE-XR) 500 MG 24 hr tablet Take 500 mg by mouth daily.   [DISCONTINUED] Study - [name] - inclisiran 300 mg/1.71mL or placebo SQ injection (PI-Stuckey) Inject 300 mg into the skin every 6 (six) months.   Current Facility-Administered Medications  for the 12/22/22 encounter (Office Visit) with Jake Bathe, MD  Medication   Study - [name] - inclisiran 300 mg/1.62mL or placebo SQ injection (PI-Stuckey)     Allergies:   Bempedoic acid, [name] [alirocumab], Rosuvastatin, Zetia [ezetimibe], Hydralazine, and Keflex [cephalexin]   Social History   Socioeconomic History   Marital status: Divorced    Spouse name: Not on file   Number of children: 2   Years of education: Not on file   Highest education level: Not on file  Occupational History   Occupation: retired  Tobacco Use   Smoking status: Never   Smokeless tobacco: Never  Vaping Use   Vaping Use: Never used  Substance and Sexual Activity   Alcohol use: No   Drug use: No   Sexual activity: Not Currently  Other Topics Concern   Not on file  Social History Narrative   Not on file   Social Determinants of Health   Financial Resource Strain: Not on file  Food Insecurity: Not on file  Transportation Needs: Not on file  Physical Activity: Not on file  Stress: Not on file  Social Connections: Not on file     Family History: The patient's family history includes Heart attack in his father and mother. There is no history of Stroke.  ROS:   Please see  the history of present illness.     All other systems reviewed and are negative.  EKGs/Labs/Other Studies Reviewed:    The following studies were reviewed today: Cardiac Studies & Procedures   CARDIAC CATHETERIZATION  CARDIAC CATHETERIZATION 10/28/2020  Narrative  3rd Diag lesion is 25% stenosed.  2nd Diag lesion is 80% stenosed.  Prox LAD to Mid LAD lesion is 40% stenosed.  Mid LAD to Dist LAD lesion is 30% stenosed with 70% stenosed side branch in 2nd Diag.  2nd Mrg lesion is 90% stenosed.  Non-stenotic RPDA lesion was previously treated.  Non-stenotic Dist RCA lesion with 0% stenosed side branch in RPDA was previously treated.  Prox RCA to Mid RCA lesion is 5% stenosed.  Ramus lesion is 100% stenosed.  Dist LM to Ost LAD lesion is 50% stenosed.  Previously placed Prox Cx to Mid Cx stent (unknown type) is widely patent.  Previously placed LPAV stent (unknown type) is widely patent.  Mid RCA to Dist RCA lesion is 35% stenosed.  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  LV end diastolic pressure is low.  The present study does not appear significantly changed from the last procedure in November 2020.  There is significant coronary calcification.  However, on the AP cranial view it appears that there is a smooth 50% distal left main stenosis which is not appreciated significantly on the other views or on the prior catheterizations.  There is no change in the diffuse diagonal stenosis of the LAD with probable 30% in-stent restenosis of the LAD stent.  The circumflex stents are patent with no change in the small caliber obtuse marginal vessel with previously noted 90% stenosis.  The RCA is calcified with luminal irregularity and 30% smooth narrowing in the region of the acute margin with widely patent distal stent extending into the mid large PDA vessel.  Mild to moderate LV dysfunction with hypokinesis involving the mid distal anterolateral wall  extending to the apex with EF estimated 40%.  LVEDP 7 mmHg.  RECOMMENDATION: Increased  medical therapy trial.  We will titrate carvedilol to 6.25 mg twice daily.  Consider possible addition of  ranolazine if recurrent symptomatology.  The patient is participating in the [place] 4 trial with inclisiran.  Findings Coronary Findings Diagnostic  Dominance: Co-dominant  Left Main Dist LM to Ost LAD lesion is 50% stenosed.  Left Anterior Descending Prox LAD to Mid LAD lesion is 40% stenosed. The lesion is severely calcified. Mid LAD to Dist LAD lesion is 30% stenosed with 70% stenosed side branch in 2nd Diag. The lesion is located at the major branch. The lesion is calcified. The lesion was previously treated using a drug eluting stent over 2 years ago. At least 2 overlapping  Second Diagonal Branch 2nd Diag lesion is 80% stenosed. The lesion is eccentric. stable from 2016 - likely not amenable to PCI due to being jailed by overlapping LAD stents  Third Diagonal Branch Vessel is moderate in size. 3rd Diag lesion is 25% stenosed.  Ramus Intermedius Vessel is small. Ramus lesion is 100% stenosed. The lesion is chronically occluded.  Left Circumflex Vessel is large. Previously placed Prox Cx to Mid Cx stent (unknown type) is widely patent.  First Obtuse Marginal Branch Vessel is small in size.  Second Obtuse Marginal Branch Vessel is small in size. 2nd Mrg lesion is 90% stenosed. The lesion is segmental and irregular. Stable from 2016 - very tortuous, small caliber vessel - not amenable to PCI  Second Left Posterolateral Branch Vessel is large in size.  Left Posterior Atrioventricular Artery Previously placed LPAV stent (unknown type) is widely patent.  Right Coronary Artery Vessel is large. Prox RCA to Mid RCA lesion is 5% stenosed. The lesion was previously treated using a stent (unknown type) over 2 years ago. Mid RCA to Dist RCA lesion is 35% stenosed. Non-stenotic Dist RCA  lesion with 0% stenosed side branch in RPDA was previously treated.  Acute Marginal Branch Vessel is small in size.  Right Ventricular Branch Vessel is small in size.  Right Posterior Descending Artery Non-stenotic RPDA lesion was previously treated. The lesion is segmental and eccentric.  Inferior Septal Vessel is small in size.  Right Posterior Atrioventricular Artery Vessel is small in size.  Intervention  No interventions have been documented.   CARDIAC CATHETERIZATION  CARDIAC CATHETERIZATION 06/11/2019  Narrative Conclusions: 1. Multivessel coronary artery disease, not significantly changed compared with prior catheterization on 05/18/2019. 2. Patent LAD, LCx, and RCA stents.  The LAD stent(s) demonstrate moderate in-stent restenosis. 3. Normal left ventricular filling pressure.  Recommendations: 1. Films reviewed with interventional team.  We have agreed to continued escalation of medical therapy, as targets are suboptimal for percutaneous intervention and CABG. 2. Start ranolazine 500 mg BID.  Continue other antianginal therapy. 3. Continue clopidogrel.  If no bleeding complications, apixaban can be restarted tomorrow and aspirin discontinued. 4. Aggressive secondary prevention.  Yvonne Kendall, MD [facility] Pager: [phone]  Findings Coronary Findings Diagnostic  Dominance: Co-dominant  Left Anterior Descending Prox LAD to Mid LAD lesion is 40% stenosed. The lesion is severely calcified. Mid LAD to Dist LAD lesion is 40% stenosed with 70% stenosed side branch in 2nd Diag. The lesion is located at the major branch. The lesion is calcified. The lesion was previously treated using a drug eluting stent over 2 years ago. At least 2 overlapping  Second Diagonal Branch 2nd Diag lesion is 75% stenosed. The lesion is eccentric. stable from 2016 - likely not amenable to PCI due to being jailed by overlapping LAD stents  Third Diagonal Branch Vessel is  moderate in size. 3rd Diag lesion is 70% stenosed.  Ramus Intermedius Vessel is small. Ramus lesion is 100% stenosed. The lesion is chronically occluded.  Left Circumflex Vessel is large.  First Obtuse Marginal Branch Vessel is small in size.  Second Obtuse Marginal Branch Vessel is small in size. 2nd Mrg lesion is 90% stenosed. The lesion is segmental and irregular. Stable from 2016 - very tortuous, small caliber vessel - not amenable to PCI  Second Left Posterolateral Branch Vessel is large in size.  Right Coronary Artery Vessel is large. Prox RCA to Mid RCA lesion is 5% stenosed. The lesion was previously treated using a stent (unknown type) over 2 years ago. Previously placed Dist RCA drug eluting stent is widely patent with 0% stenosed side branch in RPDA.  Acute Marginal Branch Vessel is small in size.  Right Ventricular Branch Vessel is small in size.  Right Posterior Descending Artery Previously placed RPDA drug eluting stent is widely patent. The lesion is segmental and eccentric.  Inferior Septal Vessel is small in size.  Right Posterior Atrioventricular Artery Vessel is small in size.  Intervention  No interventions have been documented.   STRESS TESTS  MYOCARDIAL PERFUSION IMAGING 11/23/2018  Narrative  Nuclear stress EF: 60%.  There was no ST segment deviation noted during stress.  Findings consistent with prior myocardial infarction.  This is a low risk study.  The left ventricular ejection fraction is normal (55-65%).  Small apical septal infarct no ischemia EF 60%   ECHOCARDIOGRAM  ECHOCARDIOGRAM COMPLETE 10/28/2020  Narrative ECHOCARDIOGRAM REPORT    Patient Name:   Joseph [name] Date of Exam: 10/28/2020 Medical Rec #:  [id number]      Height:       74.0 in Accession #:    [id number]     Weight:       215.5 lb Date of Birth:  [birth year]      BSA:          2.244 m Patient Age:    80 years       BP:           132/75 mmHg Patient  Gender: M              HR:           57 bpm. Exam Location:  Inpatient  Procedure: 2D Echo, Cardiac Doppler and Color Doppler  Indications:    CAD  History:        Patient has prior history of Echocardiogram examinations, most recent 06/08/2019. CAD, Stroke, Arrythmias:Atrial Fibrillation; Risk Factors:Hypertension and Dyslipidemia.  Sonographer:    Neomia Dear RDCS Referring Phys: [phone] THOMAS A KELLY   Sonographer Comments: 06/11/19 cath Patient could not lay on left side due to sore shoulder. Images improved with breath coaching. IMPRESSIONS   1. Left ventricular ejection fraction, by estimation, is 60 to 65%. The left ventricle has normal function. The left ventricle has no regional wall motion abnormalities. Left ventricular diastolic parameters are indeterminate. 2. Right ventricular systolic function is normal. The right ventricular size is normal. 3. The mitral valve is grossly normal. Trivial mitral valve regurgitation. 4. The aortic valve is tricuspid. Aortic valve regurgitation is not visualized. No aortic stenosis is present.  FINDINGS Left Ventricle: Left ventricular ejection fraction, by estimation, is 60 to 65%. The left ventricle has normal function. The left ventricle has no regional wall motion abnormalities. The left ventricular internal cavity size was normal in size. There is no left ventricular hypertrophy. Left ventricular diastolic parameters are indeterminate.  Right Ventricle: The  right ventricular size is normal. Right vetricular wall thickness was not well visualized. Right ventricular systolic function is normal.  Left Atrium: Left atrial size was normal in size.  Right Atrium: Right atrial size was normal in size.  Pericardium: There is no evidence of pericardial effusion.  Mitral Valve: The mitral valve is grossly normal. Trivial mitral valve regurgitation. MV peak gradient, 4.3 mmHg. The mean mitral valve gradient is 1.0 mmHg.  Tricuspid Valve:  The tricuspid valve is grossly normal. Tricuspid valve regurgitation is trivial.  Aortic Valve: The aortic valve is tricuspid. Aortic valve regurgitation is not visualized. No aortic stenosis is present. Aortic valve mean gradient measures 3.0 mmHg. Aortic valve peak gradient measures 6.9 mmHg. Aortic valve area, by VTI measures 3.97 cm.  Pulmonic Valve: The pulmonic valve was normal in structure. Pulmonic valve regurgitation is trivial.  Aorta: The aortic root and ascending aorta are structurally normal, with no evidence of dilitation.  IAS/Shunts: The atrial septum is grossly normal.   LEFT VENTRICLE PLAX 2D LVIDd:         5.00 cm     Diastology LVIDs:         2.70 cm     LV e' medial:    7.83 cm/s LV PW:         1.00 cm     LV E/e' medial:  9.5 LV IVS:        1.20 cm     LV e' lateral:   8.38 cm/s LVOT diam:     2.60 cm     LV E/e' lateral: 8.9 LV SV:         111 LV SV Index:   50 LVOT Area:     5.31 cm  LV Volumes (MOD) LV vol d, MOD A2C: 71.3 ml LV vol d, MOD A4C: 97.8 ml LV vol s, MOD A2C: 34.8 ml LV vol s, MOD A4C: 33.3 ml LV SV MOD A2C:     36.5 ml LV SV MOD A4C:     97.8 ml LV SV MOD BP:      50.6 ml   PULMONARY VEINS A Reversal Duration: 106.00 msec A Reversal Velocity: 20.50 cm/s Diastolic Velocity:  25.70 cm/s S/D Velocity:        0.80 Systolic Velocity:   21.30 cm/s  LEFT ATRIUM             Index       RIGHT ATRIUM           Index LA diam:        3.00 cm 1.34 cm/m  RA Area:     13.30 cm LA Vol (A2C):   42.8 ml 19.08 ml/m RA Volume:   24.00 ml  10.70 ml/m LA Vol (A4C):   51.5 ml 22.95 ml/m LA Biplane Vol: 47.7 ml 21.26 ml/m AORTIC VALVE                   PULMONIC VALVE AV Area (Vmax):    3.83 cm    PV Vmax:       0.84 m/s AV Area (Vmean):   4.25 cm    PV Vmean:      60.200 cm/s AV Area (VTI):     3.97 cm    PV VTI:        0.200 m AV Vmax:           131.00 cm/s PV Peak grad:  2.8 mmHg AV Vmean:  84.400 cm/s PV Mean grad:  2.0 mmHg AV  VTI:            0.281 m AV Peak Grad:      6.9 mmHg AV Mean Grad:      3.0 mmHg LVOT Vmax:         94.60 cm/s LVOT Vmean:        67.500 cm/s LVOT VTI:          0.210 m LVOT/AV VTI ratio: 0.75  AORTA Ao Root diam: 4.00 cm Ao Asc diam:  3.40 cm  MITRAL VALVE MV Area (PHT): 2.39 cm    SHUNTS MV Area VTI:   3.16 cm    Systemic VTI:  0.21 m MV Peak grad:  4.3 mmHg    Systemic Diam: 2.60 cm MV Mean grad:  1.0 mmHg MV Vmax:       1.04 m/s MV Vmean:      51.4 cm/s MV Decel Time: 317 msec MV E velocity: 74.40 cm/s MV A velocity: 81.30 cm/s MV E/A ratio:  0.92  Kristeen Miss MD Electronically signed by Kristeen Miss MD Signature Date/Time: 10/28/2020/12:17:35 PM    Final    MONITORS  CARDIAC EVENT MONITOR 07/13/2016  Narrative NSR.  No atrial fibrillation noted.            EKG: 12/22/2022-sinus bradycardia first-degree AV block left axis deviation poor R wave progression  Recent Labs: 01/04/2022: ALT 19  Recent Lipid Panel    Component Value Date/Time   CHOL 156 01/04/2022 1236   TRIG 184 (H) 01/04/2022 1236   HDL 29 (L) 01/04/2022 1236   CHOLHDL 5.4 (H) 01/04/2022 1236   CHOLHDL 5.4 05/18/2019 0210   VLDL 27 05/18/2019 0210   LDLCALC 95 01/04/2022 1236   LDLDIRECT 83.0 10/28/2014 1622     Risk Assessment/Calculations:              Physical Exam:    VS:  BP 136/74   Pulse (!) 57   Ht 6\' 2"  (1.88 m)   Wt 225 lb 14.4 oz (102.5 kg)   SpO2 97%   BMI 29.00 kg/m     Wt Readings from Last 3 Encounters:  12/22/22 225 lb 14.4 oz (102.5 kg)  12/21/22 217 lb (98.4 kg)  01/04/22 217 lb 6.4 oz (98.6 kg)     GEN:  Well nourished, well developed in no acute distress HEENT: Normal NECK: No JVD; No carotid bruits LYMPHATICS: No lymphadenopathy CARDIAC: RRR, no murmurs, rubs, gallops RESPIRATORY:  Clear to auscultation without rales, wheezing or rhonchi  ABDOMEN: Soft, non-tender, non-distended MUSCULOSKELETAL:  No edema; No deformity  SKIN: Warm and  dry NEUROLOGIC:  Alert and oriented x 3 PSYCHIATRIC:  Normal affect   ASSESSMENT:    1. Coronary artery disease of native artery of native heart with stable angina pectoris (HCC)   2. Chest discomfort    PLAN:    In order of problems listed above:  Coronary artery disease with angina -We will go ahead and check a pharmacologic stress test.  Last 1 was in 2020.  Main goal is to make sure that there is no high risk ischemia present.  Prior cardiac catheterization resulted in continued medical management with no significant change from prior study. -Continue with high-dose isosorbide -He is not on Ranexa  Paroxysmal atrial fibrillation -Currently has a Biotel monitor on his chest from his primary care physician in [place].  Hyperlipidemia - He may go ahead and get his study drug injection.  [name]          Medication Adjustments/Labs and Tests Ordered: Current medicines are reviewed at length with the patient today.  Concerns regarding medicines are outlined above.  Orders Placed This Encounter  Procedures   MYOCARDIAL PERFUSION IMAGING   EKG 12-Lead   No orders of the defined types were placed in this encounter.   Patient Instructions  Medication Instructions:  Your physician recommends that you continue on your current medications as directed. Please refer to the Current Medication list given to you today.  *If you need a refill on your cardiac medications before your next appointment, please call your pharmacy*  Lab Work: None ordered If you have labs (blood work) drawn today and your tests are completely normal, you will receive your results only by: MyChart Message (if you have MyChart) OR A paper copy in the mail If you have any lab test that is abnormal or we need to change your treatment, we will call you to review the results.  Testing/Procedures: Your physician has requested that you have a lexiscan myoview. For further information please visit  [email or web address] Please follow instruction sheet, as given.   Follow-Up: At [facility], you and your health needs are our priority.  As part of our continuing mission to provide you with exceptional heart care, we have created designated Provider Care Teams.  These Care Teams include your primary Cardiologist (physician) and Advanced Practice Providers (APPs -  Physician Assistants and Nurse Practitioners) who all work together to provide you with the care you need, when you need it.  Your next appointment:   1 month(s) on 02/01/23 at 1:20 PM  The format for your next appointment:   In Person  Provider:   Kristeen Miss, MD {  Other Instructions Instructions for Lexiscan Myoview (Stress Test)  Please arrive 15 minutes prior to your appointment time for registration and insurance purposes.  The test will take approximately 3 to 4 hours to complete; you may bring reading material.  If someone comes with you to your appointment, they will need to remain in the main lobby due to limited space in the testing area. **If you are pregnant or breastfeeding, please notify the nuclear lab prior to your appointment**  How to prepare for your Myocardial Perfusion Test: Do not eat or drink 3 hours prior to your test, except you may have water. Do not consume products containing caffeine (regular or decaffeinated) 12 hours prior to your test. (ex: coffee, chocolate, sodas, tea). Do bring a list of your current medications with you.  If not listed below, you may take your medications as normal. Do wear comfortable clothes (no dresses or overalls) and walking shoes, tennis shoes preferred (No heels or open toe shoes are allowed). Do NOT wear cologne, perfume, aftershave, or lotions (deodorant is allowed). If these instructions are not followed, your test will have to be rescheduled.   Signed, Donato Schultz, MD  12/22/2022 11:15 AM    [facility]

## 2022-12-22 NOTE — Research (Signed)
[name] follow up  Patient here today after seeing cardiologist - cards ordered a stress test  Per TS ok to get injection No chest palpitations since yesterday morning - no shortness of breath  Went home and worked in yard even cut down a tree yesterday with no problems   Meds reviewed   Injection given 1116 left upper abdomen   Follow up appt scheduled: 06/20/2023 @ 0900 fasting labs    Current Outpatient Medications:    acetaminophen (TYLENOL) 500 MG tablet, Take 1,000 mg by mouth as needed for moderate pain., Disp: , Rfl:    amLODipine (NORVASC) 10 MG tablet, Take 10 mg by mouth daily., Disp: , Rfl:    apixaban (ELIQUIS) 5 MG TABS tablet, Take 1 tablet (5 mg total) by mouth 2 (two) times daily., Disp: 60 tablet, Rfl: 5   carvedilol (COREG) 6.25 MG tablet, TAKE 1 TABLET BY MOUTH TWICE A DAY, Disp: 180 tablet, Rfl: 1   Cholecalciferol (VITAMIN D3) 25 MCG (1000 UT) CAPS, Take 1,000 Units by mouth daily., Disp: , Rfl:    clopidogrel (PLAVIX) 75 MG tablet, TAKE 1 TABLET BY MOUTH EVERY DAY, Disp: 90 tablet, Rfl: 1   diltiazem (CARDIZEM) 30 MG tablet, Take 1 tablet (30 mg total) by mouth as needed (Rapid Palpitations)., Disp: 30 tablet, Rfl: 5   fish oil-omega-3 fatty acids 1000 MG capsule, Take 2 g by mouth in the morning and at bedtime., Disp: , Rfl:    fluticasone (FLONASE) 50 MCG/ACT nasal spray, Place 2 sprays into both nostrils daily as needed for allergies or rhinitis. , Disp: , Rfl: 0   glipiZIDE (GLUCOTROL) 10 MG tablet, Take 10 mg by mouth 2 (two) times daily., Disp: , Rfl:    isosorbide mononitrate (IMDUR) 120 MG 24 hr tablet, Take 1 tablet (120 mg total) by mouth daily. Please keep scheduled appointment for future refills. Thank you., Disp: 90 tablet, Rfl: 0   isosorbide mononitrate (IMDUR) 30 MG 24 hr tablet, Take 30 mg by mouth every evening., Disp: , Rfl:    Multiple Vitamins-Minerals (ZINC PO), Take 1 tablet by mouth daily., Disp: , Rfl:    nitroGLYCERIN (NITROSTAT) 0.4 MG SL  tablet, PLACE 1 TAB UNDER THE TONGUE EVERY 5 MINS AS NEEDED FOR CHEST PAIN FOR UP TO 3 DOSES, Disp: 25 tablet, Rfl: 3   olmesartan (BENICAR) 40 MG tablet, Take 40 mg by mouth daily., Disp: , Rfl:    pantoprazole (PROTONIX) 40 MG tablet, Take 40 mg by mouth daily., Disp: , Rfl:    TRESIBA FLEXTOUCH 100 UNIT/ML FlexTouch Pen, Inject 30-34 Units into the skin daily., Disp: , Rfl:    triamcinolone cream (KENALOG) 0.5 %, Apply 1 application  topically as needed for irritation., Disp: , Rfl:    ranolazine (RANEXA) 500 MG 12 hr tablet, Take 1 tablet (500 mg total) by mouth 2 (two) times daily. (Patient not taking: Reported on 12/22/2022), Disp: 180 tablet, Rfl: 3  Current Facility-Administered Medications:    Study - [name] - inclisiran 300 mg/1.35mL or placebo SQ injection (PI-Stuckey), 300 mg, Subcutaneous, Q6 months, Herby Abraham, MD, 300 mg at 12/22/22 1116

## 2022-12-23 ENCOUNTER — Telehealth (HOSPITAL_COMMUNITY): Payer: Self-pay | Admitting: *Deleted

## 2022-12-23 NOTE — Telephone Encounter (Signed)
Pt reached and given instructions for MPI 

## 2022-12-30 ENCOUNTER — Ambulatory Visit (HOSPITAL_COMMUNITY): Payer: Medicare Other | Attending: Cardiology

## 2022-12-30 DIAGNOSIS — R0789 Other chest pain: Secondary | ICD-10-CM

## 2022-12-30 LAB — MYOCARDIAL PERFUSION IMAGING
Base ST Depression (mm): 0 mm
LV dias vol: 119 mL (ref 62–150)
LV sys vol: 51 mL
Nuc Stress EF: 57 %
Peak HR: 65 {beats}/min
Rest HR: 50 {beats}/min
Rest Nuclear Isotope Dose: 10.9 mCi
SDS: 2
SRS: 4
SSS: 6
ST Depression (mm): 0 mm
Stress Nuclear Isotope Dose: 32.1 mCi
TID: 1.07

## 2022-12-30 MED ORDER — TECHNETIUM TC 99M TETROFOSMIN IV KIT
10.9000 | PACK | Freq: Once | INTRAVENOUS | Status: AC | PRN
  Administered 2022-12-30: 10.9 via INTRAVENOUS

## 2022-12-30 MED ORDER — TECHNETIUM TC 99M TETROFOSMIN IV KIT
32.1000 | PACK | Freq: Once | INTRAVENOUS | Status: AC | PRN
  Administered 2022-12-30: 32.1 via INTRAVENOUS

## 2022-12-30 MED ORDER — REGADENOSON 0.4 MG/5ML IV SOLN
0.4000 mg | Freq: Once | INTRAVENOUS | Status: AC
  Administered 2022-12-30: 0.4 mg via INTRAVENOUS

## 2023-01-02 ENCOUNTER — Emergency Department (HOSPITAL_COMMUNITY): Payer: Medicare Other

## 2023-01-02 ENCOUNTER — Observation Stay (HOSPITAL_COMMUNITY): Payer: Medicare Other

## 2023-01-02 ENCOUNTER — Other Ambulatory Visit: Payer: Self-pay

## 2023-01-02 ENCOUNTER — Encounter (HOSPITAL_COMMUNITY): Payer: Self-pay

## 2023-01-02 ENCOUNTER — Inpatient Hospital Stay (HOSPITAL_COMMUNITY)
Admission: EM | Admit: 2023-01-02 | Discharge: 2023-01-06 | DRG: 390 | Disposition: A | Discharge status: Home / Self Care | Payer: Medicare Other | Attending: Internal Medicine | Admitting: Internal Medicine

## 2023-01-02 DIAGNOSIS — N183 Chronic kidney disease, stage 3 unspecified: Secondary | ICD-10-CM

## 2023-01-02 DIAGNOSIS — E1122 Type 2 diabetes mellitus with diabetic chronic kidney disease: Secondary | ICD-10-CM | POA: Diagnosis present

## 2023-01-02 DIAGNOSIS — Z602 Problems related to living alone: Secondary | ICD-10-CM | POA: Diagnosis present

## 2023-01-02 DIAGNOSIS — Z8249 Family history of ischemic heart disease and other diseases of the circulatory system: Secondary | ICD-10-CM

## 2023-01-02 DIAGNOSIS — E785 Hyperlipidemia, unspecified: Secondary | ICD-10-CM | POA: Diagnosis present

## 2023-01-02 DIAGNOSIS — Z881 Allergy status to other antibiotic agents status: Secondary | ICD-10-CM

## 2023-01-02 DIAGNOSIS — E1159 Type 2 diabetes mellitus with other circulatory complications: Secondary | ICD-10-CM | POA: Diagnosis present

## 2023-01-02 DIAGNOSIS — Z888 Allergy status to other drugs, medicaments and biological substances status: Secondary | ICD-10-CM

## 2023-01-02 DIAGNOSIS — Z981 Arthrodesis status: Secondary | ICD-10-CM

## 2023-01-02 DIAGNOSIS — I480 Paroxysmal atrial fibrillation: Secondary | ICD-10-CM | POA: Diagnosis present

## 2023-01-02 DIAGNOSIS — K5651 Intestinal adhesions [bands], with partial obstruction: Principal | ICD-10-CM | POA: Diagnosis present

## 2023-01-02 DIAGNOSIS — N1831 Chronic kidney disease, stage 3a: Secondary | ICD-10-CM | POA: Diagnosis present

## 2023-01-02 DIAGNOSIS — Z7902 Long term (current) use of antithrombotics/antiplatelets: Secondary | ICD-10-CM

## 2023-01-02 DIAGNOSIS — E1151 Type 2 diabetes mellitus with diabetic peripheral angiopathy without gangrene: Secondary | ICD-10-CM | POA: Diagnosis present

## 2023-01-02 DIAGNOSIS — Z7984 Long term (current) use of oral hypoglycemic drugs: Secondary | ICD-10-CM

## 2023-01-02 DIAGNOSIS — Z955 Presence of coronary angioplasty implant and graft: Secondary | ICD-10-CM

## 2023-01-02 DIAGNOSIS — Z8673 Personal history of transient ischemic attack (TIA), and cerebral infarction without residual deficits: Secondary | ICD-10-CM

## 2023-01-02 DIAGNOSIS — K56609 Unspecified intestinal obstruction, unspecified as to partial versus complete obstruction: Secondary | ICD-10-CM | POA: Diagnosis not present

## 2023-01-02 DIAGNOSIS — Z9079 Acquired absence of other genital organ(s): Secondary | ICD-10-CM

## 2023-01-02 DIAGNOSIS — M5136 Other intervertebral disc degeneration, lumbar region: Secondary | ICD-10-CM | POA: Diagnosis present

## 2023-01-02 DIAGNOSIS — R109 Unspecified abdominal pain: Secondary | ICD-10-CM | POA: Diagnosis not present

## 2023-01-02 DIAGNOSIS — I252 Old myocardial infarction: Secondary | ICD-10-CM

## 2023-01-02 DIAGNOSIS — D72829 Elevated white blood cell count, unspecified: Secondary | ICD-10-CM | POA: Diagnosis not present

## 2023-01-02 DIAGNOSIS — Z794 Long term (current) use of insulin: Secondary | ICD-10-CM

## 2023-01-02 DIAGNOSIS — Z8546 Personal history of malignant neoplasm of prostate: Secondary | ICD-10-CM

## 2023-01-02 DIAGNOSIS — E7800 Pure hypercholesterolemia, unspecified: Secondary | ICD-10-CM | POA: Diagnosis present

## 2023-01-02 DIAGNOSIS — Z79899 Other long term (current) drug therapy: Secondary | ICD-10-CM

## 2023-01-02 DIAGNOSIS — I2510 Atherosclerotic heart disease of native coronary artery without angina pectoris: Secondary | ICD-10-CM | POA: Diagnosis present

## 2023-01-02 DIAGNOSIS — I129 Hypertensive chronic kidney disease with stage 1 through stage 4 chronic kidney disease, or unspecified chronic kidney disease: Secondary | ICD-10-CM | POA: Diagnosis present

## 2023-01-02 DIAGNOSIS — I10 Essential (primary) hypertension: Secondary | ICD-10-CM | POA: Diagnosis present

## 2023-01-02 LAB — COMPREHENSIVE METABOLIC PANEL
ALT: 38 U/L (ref 0–44)
AST: 25 U/L (ref 15–41)
Albumin: 4 g/dL (ref 3.5–5.0)
Alkaline Phosphatase: 72 U/L (ref 38–126)
Anion gap: 15 (ref 5–15)
BUN: 28 mg/dL — ABNORMAL HIGH (ref 8–23)
CO2: 21 mmol/L — ABNORMAL LOW (ref 22–32)
Calcium: 9.8 mg/dL (ref 8.9–10.3)
Chloride: 100 mmol/L (ref 98–111)
Creatinine, Ser: 1.47 mg/dL — ABNORMAL HIGH (ref 0.61–1.24)
GFR, Estimated: 47 mL/min — ABNORMAL LOW (ref >60)
Glucose, Bld: 170 mg/dL — ABNORMAL HIGH (ref 70–99)
Potassium: 4.3 mmol/L (ref 3.5–5.1)
Sodium: 136 mmol/L (ref 135–145)
Total Bilirubin: 0.8 mg/dL (ref 0.3–1.2)
Total Protein: 6.7 g/dL (ref 6.5–8.1)

## 2023-01-02 LAB — CBC WITH DIFFERENTIAL/PLATELET
Abs Immature Granulocytes: 0.01 10*3/uL (ref 0.00–0.07)
Basophils Absolute: 0 10*3/uL (ref 0.0–0.1)
Basophils Relative: 0 %
Eosinophils Absolute: 0.2 10*3/uL (ref 0.0–0.5)
Eosinophils Relative: 5 %
HCT: 43.1 % (ref 39.0–52.0)
Hemoglobin: 14.7 g/dL (ref 13.0–17.0)
Immature Granulocytes: 0 %
Lymphocytes Relative: 16 %
Lymphs Abs: 0.8 10*3/uL (ref 0.7–4.0)
MCH: 30.9 pg (ref 26.0–34.0)
MCHC: 34.1 g/dL (ref 30.0–36.0)
MCV: 90.7 fL (ref 80.0–100.0)
Monocytes Absolute: 0.9 10*3/uL (ref 0.1–1.0)
Monocytes Relative: 18 %
Neutro Abs: 3.1 10*3/uL (ref 1.7–7.7)
Neutrophils Relative %: 61 %
Platelets: 279 10*3/uL (ref 150–400)
RBC: 4.75 MIL/uL (ref 4.22–5.81)
RDW: 12.4 % (ref 11.5–15.5)
WBC: 5.1 10*3/uL (ref 4.0–10.5)
nRBC: 0 % (ref 0.0–0.2)

## 2023-01-02 LAB — URINALYSIS, ROUTINE W REFLEX MICROSCOPIC
Bilirubin Urine: NEGATIVE
Glucose, UA: NEGATIVE mg/dL
Ketones, ur: NEGATIVE mg/dL
Leukocytes,Ua: NEGATIVE
Nitrite: NEGATIVE
Protein, ur: 100 mg/dL — AB
Specific Gravity, Urine: 1.018 (ref 1.005–1.030)
pH: 5 (ref 5.0–8.0)

## 2023-01-02 LAB — GLUCOSE, CAPILLARY: Glucose-Capillary: 193 mg/dL — ABNORMAL HIGH (ref 70–99)

## 2023-01-02 LAB — LIPASE, BLOOD: Lipase: 21 U/L (ref 11–51)

## 2023-01-02 LAB — TSH: TSH: 1.982 u[IU]/mL (ref 0.350–4.500)

## 2023-01-02 LAB — MAGNESIUM: Magnesium: 1.9 mg/dL (ref 1.7–2.4)

## 2023-01-02 MED ORDER — ONDANSETRON HCL 4 MG/2ML IJ SOLN
4.0000 mg | Freq: Once | INTRAMUSCULAR | Status: AC
  Administered 2023-01-02: 4 mg via INTRAVENOUS
  Filled 2023-01-02: qty 2

## 2023-01-02 MED ORDER — INSULIN ASPART 100 UNIT/ML IJ SOLN: 0.0000 [IU] | Freq: Every day | INTRAMUSCULAR | Status: DC

## 2023-01-02 MED ORDER — ONDANSETRON HCL 4 MG/2ML IJ SOLN
4.0000 mg | Freq: Four times a day (QID) | INTRAMUSCULAR | Status: DC | PRN
  Administered 2023-01-02: 4 mg via INTRAVENOUS
  Filled 2023-01-02 (×2): qty 2

## 2023-01-02 MED ORDER — DIATRIZOATE MEGLUMINE & SODIUM 66-10 % PO SOLN
90.0000 mL | Freq: Once | ORAL | Status: AC
  Administered 2023-01-02: 90 mL via NASOGASTRIC
  Filled 2023-01-02: qty 90

## 2023-01-02 MED ORDER — ACETAMINOPHEN 325 MG PO TABS: 650.0000 mg | ORAL_TABLET | Freq: Four times a day (QID) | ORAL | Status: DC | PRN

## 2023-01-02 MED ORDER — METOPROLOL TARTRATE 5 MG/5ML IV SOLN: 5.0000 mg | Freq: Four times a day (QID) | INTRAVENOUS | Status: DC | PRN

## 2023-01-02 MED ORDER — ONDANSETRON HCL 4 MG PO TABS: 4.0000 mg | ORAL_TABLET | Freq: Four times a day (QID) | ORAL | Status: DC | PRN

## 2023-01-02 MED ORDER — LACTATED RINGERS IV SOLN: INTRAVENOUS | Status: DC

## 2023-01-02 MED ORDER — ACETAMINOPHEN 650 MG RE SUPP: 650.0000 mg | Freq: Four times a day (QID) | RECTAL | Status: DC | PRN

## 2023-01-02 MED ORDER — INSULIN ASPART 100 UNIT/ML IJ SOLN
0.0000 [IU] | Freq: Three times a day (TID) | INTRAMUSCULAR | Status: DC
  Administered 2023-01-03 (×2): 1 [IU] via SUBCUTANEOUS
  Administered 2023-01-04 (×2): 2 [IU] via SUBCUTANEOUS
  Administered 2023-01-05: 1 [IU] via SUBCUTANEOUS
  Administered 2023-01-05: 2 [IU] via SUBCUTANEOUS
  Administered 2023-01-06: 1 [IU] via SUBCUTANEOUS
  Administered 2023-01-06: 2 [IU] via SUBCUTANEOUS

## 2023-01-02 MED ORDER — MORPHINE SULFATE (PF) 4 MG/ML IV SOLN
4.0000 mg | Freq: Once | INTRAVENOUS | Status: AC
  Administered 2023-01-02: 4 mg via INTRAVENOUS
  Filled 2023-01-02: qty 1

## 2023-01-02 MED ORDER — MORPHINE SULFATE (PF) 2 MG/ML IV SOLN
2.0000 mg | INTRAVENOUS | Status: DC | PRN
  Administered 2023-01-03 – 2023-01-04 (×3): 2 mg via INTRAVENOUS
  Filled 2023-01-02 (×3): qty 1

## 2023-01-02 MED ORDER — IOHEXOL 350 MG/ML SOLN
75.0000 mL | Freq: Once | INTRAVENOUS | Status: AC | PRN
  Administered 2023-01-02: 75 mL via INTRAVENOUS

## 2023-01-02 NOTE — Assessment & Plan Note (Addendum)
Statin intolerant Participating in study for orion -PCSK9 drug

## 2023-01-02 NOTE — ED Provider Notes (Signed)
  Physical Exam  BP 131/75 (BP Location: Left Arm)   Pulse 78   Temp 98 F (36.7 C)   Resp 18   Ht 6\' 2"  (1.88 m)   Wt 97.1 kg   SpO2 98%   BMI 27.48 kg/m   Physical Exam Constitutional:      General: He is not in acute distress.    Appearance: Normal appearance.  HENT:     Head: Normocephalic and atraumatic.     Nose: No congestion or rhinorrhea.  Eyes:     General:        Right eye: No discharge.        Left eye: No discharge.     Extraocular Movements: Extraocular movements intact.     Pupils: Pupils are equal, round, and reactive to light.  Cardiovascular:     Rate and Rhythm: Normal rate and regular rhythm.     Heart sounds: No murmur heard. Pulmonary:     Effort: No respiratory distress.     Breath sounds: No wheezing or rales.  Abdominal:     General: There is distension.     Tenderness: There is generalized abdominal tenderness.  Musculoskeletal:        General: Normal range of motion.     Cervical back: Normal range of motion.  Skin:    General: Skin is warm and dry.  Neurological:     General: No focal deficit present.     Mental Status: He is alert.     Procedures  Procedures  ED Course / MDM    Medical Decision Making Amount and/or Complexity of Data Reviewed Labs: ordered. Radiology: ordered.  Risk Prescription drug management. Decision regarding hospitalization.   Patient received in handoff.  Abdominal pain and distention pending CT abdomen pelvis.  CT abdomen pelvis with partial small bowel obstruction.  Spoke with Dr. Bedelia Person in the surgical team will round routinely.  NG tube placed and patient admitted to the hospitalist.       Glendora Score, MD 01/02/23 1911

## 2023-01-02 NOTE — Assessment & Plan Note (Signed)
Continue medical management 

## 2023-01-02 NOTE — Consult Note (Signed)
Reason for Consult/Chief Complaint:SBO Consultant: Kommor, MD  Joseph [name] is an 82 y.o. male.   HPI: 50M with a history of abdominal pain since 6/14 with one episode of emesis on 6/15. Last BM this AM, descirbed as small. Last normal BM was on 6/12. Prior prostate surgery. Last colonoscopy 5y ago.   Past Medical History:  Diagnosis Date   Abdominal discomfort    Abdominal discomfort: EGD 2011 showed no significant abnormalities.  Possibly due to metformin.   Allergic rhinitis    Anticoagulated 05/30/2016   CAD (coronary artery disease)    a. s/p MI in 2004:  stents to the LAD and staged stent RCA, EF 55%;  b.  NSTEMI (1/16):  LHC - mid LAD stent with diff dist restenosis, prox to mid Dx mod dsz jailed by stent, small OM1 99, mCFX 75, dCFX 80, mRCA stent ok, mPDA mild to mod dsz, EF 55% >> PCI:  Synergy DES to LAD; Synergy DES (x2) mid and dist CFX   CAD in native artery 06/12/2019   Chronic kidney disease, stage 2, mildly decreased GFR    stage 2-3   DDD (degenerative disc disease)    L4 to S1 degenerative disease and spondylolisthesis: low back pain.  s/p back surgery   DM (diabetes mellitus) (HCC)    History of CVA (cerebrovascular accident)    Small CVA seen by head CT (incidental) in 1/13.  Carotid dopplers in 1/13 showed minimal disease   History of Doppler ultrasound    Arterial dopplers (3/11): no evidence for significant PAD. ABIs 10/12: Normal.     HLD (hyperlipidemia)    HTN (hypertension)    had side effects with chlorthalidone   Hx of cardiovascular stress test    Myoview (1/13): Small fixed apical septal defect with no ischemia, EF 58%.   Hx of echocardiogram    Echo (3/11): EF 60%, normal wall motion, mild MR, mild left atrial enlargement, mildly dilated ascending aorta.   PAF (paroxysmal atrial fibrillation) (HCC) 05/29/2016   Spondylolisthesis    L4-S1   Statin intolerance     Past Surgical History:  Procedure Laterality Date   COLONOSCOPY N/A  12/26/2017   Procedure: COLONOSCOPY;  Surgeon: Benancio Deeds, MD;  Location: [facility] ENDOSCOPY;  Service: Gastroenterology;  Laterality: N/A;   COLONOSCOPY W/ POLYPECTOMY  12/2017   CORONARY ANGIOGRAM  11/13/2014   Procedure: CORONARY ANGIOGRAM;  Surgeon: Laurey Morale, MD;  Location: [facility] CATH LAB;  Service: Cardiovascular;;   CORONARY STENT INTERVENTION N/A 05/18/2019   Procedure: CORONARY STENT INTERVENTION;  Surgeon: Marykay Lex, MD;  Location: [facility] INVASIVE CV LAB;  Service: Cardiovascular;  Laterality: N/A;   CORONARY STENT PLACEMENT  2004   x2 LAD and RCA    LEFT HEART CATH AND CORONARY ANGIOGRAPHY N/A 05/18/2019   Procedure: LEFT HEART CATH AND CORONARY ANGIOGRAPHY;  Surgeon: Marykay Lex, MD;  Location: [facility] INVASIVE CV LAB;  Service: Cardiovascular;  Laterality: N/A;   LEFT HEART CATH AND CORONARY ANGIOGRAPHY N/A 06/11/2019   Procedure: LEFT HEART CATH AND CORONARY ANGIOGRAPHY;  Surgeon: Yvonne Kendall, MD;  Location: [facility] INVASIVE CV LAB;  Service: Cardiovascular;  Laterality: N/A;   LEFT HEART CATH AND CORONARY ANGIOGRAPHY N/A 10/27/2020   Procedure: LEFT HEART CATH AND CORONARY ANGIOGRAPHY;  Surgeon: Lennette Bihari, MD;  Location: [facility] INVASIVE CV LAB;  Service: Cardiovascular;  Laterality: N/A;   LEFT HEART CATHETERIZATION WITH CORONARY ANGIOGRAM N/A 08/02/2014   Procedure: LEFT HEART CATHETERIZATION WITH CORONARY  ANGIOGRAM;  Surgeon: Corky Crafts, MD;  Location: [facility] CATH LAB;  Service: Cardiovascular;  Laterality: N/A;   PROSTATE SURGERY     SPINAL FUSION     L4-S1    Family History  Problem Relation Age of Onset   Heart attack Mother    Heart attack Father    Stroke Neg Hx     Social History:  reports that he has never smoked. He has never used smokeless tobacco. He reports that he does not drink alcohol and does not use drugs.  Allergies:  Allergies  Allergen Reactions   Bempedoic Acid Other (See Comments)    NEXLETOL Myalgias, GI upset   [name]  [Alirocumab] Other (See Comments)    Muscle weakness and rash   Rosuvastatin Other (See Comments)    Low energy/leg and hip pain   Zetia [Ezetimibe] Other (See Comments)    myalgias   Hydralazine Other (See Comments)    Muscle Aches   Keflex [Cephalexin] Other (See Comments)    Reaction unk    Medications: I have reviewed the patient's current medications.  Results for orders placed or performed during the hospital encounter of 01/02/23 (from the past 48 hour(s))  CBC with Differential/Platelet     Status: None   Collection Time: 01/02/23  1:37 PM  Result Value Ref Range   WBC 5.1 4.0 - 10.5 K/uL   RBC 4.75 4.22 - 5.81 MIL/uL   Hemoglobin 14.7 13.0 - 17.0 g/dL   HCT 16.1 09.6 - 04.5 %   MCV 90.7 80.0 - 100.0 fL   MCH 30.9 26.0 - 34.0 pg   MCHC 34.1 30.0 - 36.0 g/dL   RDW 40.9 81.1 - 91.4 %   Platelets 279 150 - 400 K/uL   nRBC 0.0 0.0 - 0.2 %   Neutrophils Relative % 61 %   Neutro Abs 3.1 1.7 - 7.7 K/uL   Lymphocytes Relative 16 %   Lymphs Abs 0.8 0.7 - 4.0 K/uL   Monocytes Relative 18 %   Monocytes Absolute 0.9 0.1 - 1.0 K/uL   Eosinophils Relative 5 %   Eosinophils Absolute 0.2 0.0 - 0.5 K/uL   Basophils Relative 0 %   Basophils Absolute 0.0 0.0 - 0.1 K/uL   Immature Granulocytes 0 %   Abs Immature Granulocytes 0.01 0.00 - 0.07 K/uL    Comment: Performed at [facility], [address], [place], [place] [address]  Lipase, blood     Status: None   Collection Time: 01/02/23  1:37 PM  Result Value Ref Range   Lipase 21 11 - 51 U/L    Comment: Performed at [facility], [address] [place], [place] [address]  Comprehensive metabolic panel     Status: Abnormal   Collection Time: 01/02/23  1:37 PM  Result Value Ref Range   Sodium 136 135 - 145 mmol/L   Potassium 4.3 3.5 - 5.1 mmol/L   Chloride 100 98 - 111 mmol/L   CO2 21 (L) 22 - 32 mmol/L   Glucose, Bld 170 (H) 70 - 99 mg/dL    Comment: Glucose reference range applies only to samples taken after  fasting for at least 8 hours.   BUN 28 (H) 8 - 23 mg/dL   Creatinine, Ser 8.65 (H) 0.61 - 1.24 mg/dL   Calcium 9.8 8.9 - 78.4 mg/dL   Total Protein 6.7 6.5 - 8.1 g/dL   Albumin 4.0 3.5 - 5.0 g/dL   AST 25 15 -  41 U/L   ALT 38 0 - 44 U/L   Alkaline Phosphatase 72 38 - 126 U/L   Total Bilirubin 0.8 0.3 - 1.2 mg/dL   GFR, Estimated 47 (L) >60 mL/min    Comment: (NOTE) Calculated using the CKD-EPI Creatinine Equation (2021)    Anion gap 15 5 - 15    Comment: Performed at [facility], [address] [place], [place] [address]  Urinalysis, Routine w reflex microscopic -Urine, Clean Catch     Status: Abnormal   Collection Time: 01/02/23  1:37 PM  Result Value Ref Range   Color, Urine YELLOW YELLOW   APPearance CLEAR CLEAR   Specific Gravity, Urine 1.018 1.005 - 1.030   pH 5.0 5.0 - 8.0   Glucose, UA NEGATIVE NEGATIVE mg/dL   Hgb urine dipstick SMALL (A) NEGATIVE   Bilirubin Urine NEGATIVE NEGATIVE   Ketones, ur NEGATIVE NEGATIVE mg/dL   Protein, ur 604 (A) NEGATIVE mg/dL   Nitrite NEGATIVE NEGATIVE   Leukocytes,Ua NEGATIVE NEGATIVE   RBC / HPF 0-5 0 - 5 RBC/hpf   WBC, UA 0-5 0 - 5 WBC/hpf   Bacteria, UA RARE (A) NONE SEEN   Squamous Epithelial / HPF 0-5 0 - 5 /HPF   Mucus PRESENT     Comment: Performed at [facility], [address], [place], [place] [address]    CT ABDOMEN PELVIS W CONTRAST  Result Date: 01/02/2023 CLINICAL DATA:  Acute non localized abdominal pain. EXAM: CT ABDOMEN AND PELVIS WITH CONTRAST TECHNIQUE: Multidetector CT imaging of the abdomen and pelvis was performed using the standard protocol following bolus administration of intravenous contrast. RADIATION DOSE REDUCTION: This exam was performed according to the departmental dose-optimization program which includes automated exposure control, adjustment of the mA and/or kV according to patient size and/or use of iterative reconstruction technique. CONTRAST:  75mL OMNIPAQUE IOHEXOL 350 MG/ML SOLN  COMPARISON:  03/16/2020 FINDINGS: Lower Chest: No acute findings. Hepatobiliary: No hepatic masses identified. Layering sludge or tiny gallstones noted, without evidence of cholecystitis or biliary ductal dilatation. Pancreas:  No mass or inflammatory changes. Spleen: Within normal limits in size and appearance. Adrenals/Urinary Tract: No suspicious masses identified. Multiple small less than 1 cm renal calculi seen bilaterally. No evidence of ureteral calculi or hydronephrosis. A 4 mm calculus is seen within the urinary bladder. Stomach/Bowel: Moderately dilated small bowel loops air-fluid levels are seen, with transition point in the right lateral abdomen. No mass or inflammatory process is seen at this site, and this is suspicious for adhesion. Mild sigmoid diverticulosis is noted, however there is no evidence of diverticulitis. Small amount of free fluid in pelvis, however no evidence of abscess. Vascular/Lymphatic: No pathologically enlarged lymph nodes. No acute vascular findings. Aortic atherosclerotic calcification incidentally noted. Reproductive:  Prior prostatectomy. Other:  None. Musculoskeletal:  No suspicious bone lesions identified. IMPRESSION: Partial small bowel obstruction, with transition point in right lateral abdomen suspicious for adhesion. Small amount of free fluid in pelvis. No evidence of abscess. Bilateral nephrolithiasis and 4 mm bladder calculus. No evidence of ureteral calculi or hydronephrosis. Cholelithiasis versus gallbladder sludge. No radiographic evidence of cholecystitis. Mild sigmoid diverticulosis, without radiographic evidence of diverticulitis. Aortic Atherosclerosis (ICD10-I70.0). Electronically Signed   By: Danae Orleans M.D.   On: 01/02/2023 15:28    ROS 10 point review of systems is negative except as listed above in HPI.   Physical Exam Blood pressure 131/75, pulse 78, temperature 98 F (36.7 C), resp. rate 18, height 6\' 2"  (  1.88 m), weight 97.1 kg, SpO2 98  %. Constitutional: well-developed, well-nourished HEENT: pupils equal, round, reactive to light, 2mm b/l, moist conjunctiva, external inspection of ears and nose normal, hearing intact Oropharynx: normal oropharyngeal mucosa, normal dentition Neck: no thyromegaly, trachea midline, no midline cervical tenderness to palpation Chest: breath sounds equal bilaterally, normal respiratory effort, no midline or lateral chest wall tenderness to palpation/deformity Abdomen: soft, distended, no bruising, no hepatosplenomegaly GU: no blood at urethral meatus of penis, no scrotal masses or abnormality  Back: no wounds, no thoracic/lumbar spine tenderness to palpation, no thoracic/lumbar spine stepoffs Rectal: deferred Extremities: 2+ radial and pedal pulses bilaterally, intact motor and sensation bilateral UE and LE, no peripheral edema MSK: unable to assess gait/station, no clubbing/cyanosis of fingers/toes, normal ROM of all four extremities Skin: warm, dry, no rashes Psych: normal memory, normal mood/affect     Assessment/Plan: 14M with SBO. NGT in place at the time of my arrival, repositioned by me with prompt 300cc bilious o/p obtained. SBO protocol ordered.    Diamantina Monks, MD General and Trauma Surgery [facility]

## 2023-01-02 NOTE — Assessment & Plan Note (Signed)
Blood pressure well controlled Continue home regimen when tolerating PO IV lopressor PRN

## 2023-01-02 NOTE — Assessment & Plan Note (Addendum)
82 year old with acute onset of generalized abdominal pain and diarrhea x 4 days with associated nausea found to have partial SBO with transition point in right lateral abdomen suspicious for adhesion by CT.  -obs to med-surg  -NPO  -no clinical findings for emergent surgery at this time  -Gentle IV fluid hydration -NG tube with small bowel protocol  -Pain medication -anti-emetics  -encourage ambulation -Monitor electrolytes -General surgery consulted: Dr. Bedelia Person  -no hx of abdominal surgeries.

## 2023-01-02 NOTE — ED Provider Notes (Signed)
[place] EMERGENCY DEPARTMENT AT [facility] Provider Note   CSN: [id number] Arrival date & time: 01/02/23  1318     History  Chief Complaint  Patient presents with   Abdominal Pain    KINGDON LENZINI is a 82 y.o. male.  A 25 male presents with several days of abdominal discomfort.  States he had diarrhea for about a week and went to urgent care and was treated for that and that the diarrhea has improved.  Denies any emesis currently although he did have some earlier this week.  No fever reported.  Notes some abdominal distention.  States symptoms do get worse somewhat when he eats.  Notes he has had lots of belching.  Denies any urinary symptoms.  No treatment use prior to arrival       Home Medications Prior to Admission medications   Medication Sig Start Date End Date Taking? Authorizing Provider  acetaminophen (TYLENOL) 500 MG tablet Take 1,000 mg by mouth as needed for moderate pain.    [provider]  amLODipine (NORVASC) 10 MG tablet Take 10 mg by mouth daily. 05/22/20   [provider]  apixaban (ELIQUIS) 5 MG TABS tablet Take 1 tablet (5 mg total) by mouth 2 (two) times daily. 12/09/22   Nahser, Deloris Ping, MD  carvedilol (COREG) 6.25 MG tablet TAKE 1 TABLET BY MOUTH TWICE A DAY 06/29/22   Nahser, Deloris Ping, MD  Cholecalciferol (VITAMIN D3) 25 MCG (1000 UT) CAPS Take 1,000 Units by mouth daily.    [provider]  clopidogrel (PLAVIX) 75 MG tablet TAKE 1 TABLET BY MOUTH EVERY DAY 08/11/22   Nahser, Deloris Ping, MD  diltiazem (CARDIZEM) 30 MG tablet Take 1 tablet (30 mg total) by mouth as needed (Rapid Palpitations). 01/31/18   Tereso Newcomer T, PA-C  fish oil-omega-3 fatty acids 1000 MG capsule Take 2 g by mouth in the morning and at bedtime.    [provider]  fluticasone (FLONASE) 50 MCG/ACT nasal spray Place 2 sprays into both nostrils daily as needed for allergies or rhinitis.  03/31/15   [provider]  glipiZIDE  (GLUCOTROL) 10 MG tablet Take 10 mg by mouth 2 (two) times daily. 01/08/20   [provider]  isosorbide mononitrate (IMDUR) 120 MG 24 hr tablet Take 1 tablet (120 mg total) by mouth daily. Please keep scheduled appointment for future refills. Thank you. 10/28/22   Nahser, Deloris Ping, MD  isosorbide mononitrate (IMDUR) 30 MG 24 hr tablet Take 30 mg by mouth every evening.    [provider]  Multiple Vitamins-Minerals (ZINC PO) Take 1 tablet by mouth daily.    [provider]  nitroGLYCERIN (NITROSTAT) 0.4 MG SL tablet PLACE 1 TAB UNDER THE TONGUE EVERY 5 MINS AS NEEDED FOR CHEST PAIN FOR UP TO 3 DOSES 05/07/22   Nahser, Deloris Ping, MD  olmesartan (BENICAR) 40 MG tablet Take 40 mg by mouth daily. 08/22/20   [provider]  pantoprazole (PROTONIX) 40 MG tablet Take 40 mg by mouth daily. 12/14/22   [provider]  ranolazine (RANEXA) 500 MG 12 hr tablet Take 1 tablet (500 mg total) by mouth 2 (two) times daily. Patient not taking: Reported on 12/22/2022 05/12/21   Nahser, Deloris Ping, MD  TRESIBA FLEXTOUCH 100 UNIT/ML FlexTouch Pen Inject 30-34 Units into the skin daily. 09/10/19   [provider]  triamcinolone cream (KENALOG) 0.5 % Apply 1 application  topically as needed for irritation. 08/24/17   [provider]      Allergies    Bempedoic acid, [name] [alirocumab], Rosuvastatin, Zetia [ezetimibe], Hydralazine, and Keflex [cephalexin]    Review of Systems   Review of Systems  All other systems reviewed and are negative.   Physical Exam Updated Vital Signs BP 131/75 (BP Location: Left Arm)   Pulse 78   Temp (!) 97.5 F (36.4 C) (Oral)   Resp 18   Ht 1.88 m (6\' 2" )   Wt 97.1 kg   SpO2 98%   BMI 27.48 kg/m  Physical Exam Vitals and nursing note reviewed.  Constitutional:      General: He is not in acute distress.    Appearance: Normal appearance. He is well-developed. He is not toxic-appearing.  HENT:     Head: Normocephalic and  atraumatic.  Eyes:     General: Lids are normal.     Conjunctiva/sclera: Conjunctivae normal.     Pupils: Pupils are equal, round, and reactive to light.  Neck:     Thyroid: No thyroid mass.     Trachea: No tracheal deviation.  Cardiovascular:     Rate and Rhythm: Normal rate and regular rhythm.     Heart sounds: Normal heart sounds. No murmur heard.    No gallop.  Pulmonary:     Effort: Pulmonary effort is normal. No respiratory distress.     Breath sounds: Normal breath sounds. No stridor. No decreased breath sounds, wheezing, rhonchi or rales.  Abdominal:     General: Abdomen is protuberant. There is distension.     Palpations: Abdomen is soft.     Tenderness: There is generalized abdominal tenderness. There is no guarding or rebound.  Musculoskeletal:        General: No tenderness. Normal range of motion.     Cervical back: Normal range of motion and neck supple.  Skin:    General: Skin is warm and dry.     Findings: No abrasion or rash.  Neurological:     Mental Status: He is alert and oriented to person, place, and time. Mental status is at baseline.     GCS: GCS eye subscore is 4. GCS verbal subscore is 5. GCS motor subscore is 6.     Cranial Nerves: No cranial nerve deficit.     Sensory: No sensory deficit.     Motor: Motor function is intact.  Psychiatric:        Attention and Perception: Attention normal.        Speech: Speech normal.        Behavior: Behavior normal.     ED Results / Procedures / Treatments   Labs (all labs ordered are listed, but only abnormal results are displayed) Labs Reviewed  CBC WITH DIFFERENTIAL/PLATELET  LIPASE, BLOOD  COMPREHENSIVE METABOLIC PANEL  URINALYSIS, ROUTINE W REFLEX MICROSCOPIC    EKG None  Radiology No results found.  Procedures Procedures    Medications Ordered in ED Medications  lactated ringers infusion (has no administration in time range)    ED Course/ Medical Decision Making/ A&P                              Medical Decision Making Amount and/or Complexity of Data Reviewed Labs: ordered. Radiology: ordered.  Risk Prescription drug management.   Patient's laboratory studies here are reassuring with exception of a mildly increased creatinine.  His abdomen is diffusely tympanitic.  No peritoneal signs.  Abdominal CT is pending.  Care turned over to Dr. Earle Gell        Final Clinical Impression(s) / ED Diagnoses Final diagnoses:  None    Rx / DC Orders ED Discharge Orders     None         Lorre Nick, MD 01/02/23 1511

## 2023-01-02 NOTE — Assessment & Plan Note (Addendum)
A1C of 7.5 this month  Hold long acting insulin while NPO today. Had his dose this AM  Hold glipizide  SSI with bedtime SS and accuchecks QAC/HS

## 2023-01-02 NOTE — ED Notes (Signed)
ED TO INPATIENT HANDOFF REPORT  ED Nurse Name and Phone #: Merry Lofty [phone]  S Name/Age/Gender Joseph [name] 82 y.o. male Room/Bed: 036C/036C  Code Status   Code Status: Full Code  Home/SNF/Other Home Patient oriented to: self, place, time, and situation Is this baseline? Yes   Triage Complete: Triage complete  Chief Complaint Small bowel obstruction (HCC) [K56.609]  Triage Note Pt bib ems from home c/o abdominal pain since Thursday night. Pt was seen at [facility] Friday. He was prescribed Loperamide. The medication has relieved some diarrhea but the pain still persist. Pt had one episode of emesis Friday. Today pt denies N/V/D. Denies fevers and chills. Pt denies any abdominal surgeries. He had prostate surgery years ago. Pt states his appetite has decreased slightly.  BP130/70 CBG 154 RA 96% Temp 97.9   Allergies Allergies  Allergen Reactions   Bempedoic Acid Other (See Comments)    NEXLETOL Myalgias, GI upset   [name] [Alirocumab] Other (See Comments)    Muscle weakness and rash   Rosuvastatin Other (See Comments)    Low energy/leg and hip pain   Zetia [Ezetimibe] Other (See Comments)    myalgias   Hydralazine Other (See Comments)    Muscle Aches   Keflex [Cephalexin] Other (See Comments)    Reaction unk    Level of Care/Admitting Diagnosis ED Disposition     ED Disposition  Admit   Condition  --   Comment  Hospital Area: [facility] [[id number]]  Level of Care: Med-Surg [16]  May place patient in observation at [facility] or [facility] if equivalent level of care is available:: No  Covid Evaluation: Asymptomatic - no recent exposure (last 10 days) testing not required  Diagnosis: Small bowel obstruction [facility]) [[id number]]  Admitting Physician: Orland Mustard [[id number]]  Attending Physician: Orland Mustard [[id number]]          B Medical/Surgery History Past Medical History:  Diagnosis Date   Abdominal discomfort    Abdominal  discomfort: EGD 2011 showed no significant abnormalities.  Possibly due to metformin.   Allergic rhinitis    Anticoagulated 05/30/2016   CAD (coronary artery disease)    a. s/p MI in 2004:  stents to the LAD and staged stent RCA, EF 55%;  b.  NSTEMI (1/16):  LHC - mid LAD stent with diff dist restenosis, prox to mid Dx mod dsz jailed by stent, small OM1 99, mCFX 75, dCFX 80, mRCA stent ok, mPDA mild to mod dsz, EF 55% >> PCI:  Synergy DES to LAD; Synergy DES (x2) mid and dist CFX   CAD in native artery 06/12/2019   Chronic kidney disease, stage 2, mildly decreased GFR    stage 2-3   DDD (degenerative disc disease)    L4 to S1 degenerative disease and spondylolisthesis: low back pain.  s/p back surgery   DM (diabetes mellitus) (HCC)    History of CVA (cerebrovascular accident)    Small CVA seen by head CT (incidental) in 1/13.  Carotid dopplers in 1/13 showed minimal disease   History of Doppler ultrasound    Arterial dopplers (3/11): no evidence for significant PAD. ABIs 10/12: Normal.     HLD (hyperlipidemia)    HTN (hypertension)    had side effects with chlorthalidone   Hx of cardiovascular stress test    Myoview (1/13): Small fixed apical septal defect with no ischemia, EF 58%.   Hx of echocardiogram    Echo (3/11): EF 60%, normal wall motion, mild MR,  mild left atrial enlargement, mildly dilated ascending aorta.   PAF (paroxysmal atrial fibrillation) (HCC) 05/29/2016   Spondylolisthesis    L4-S1   Statin intolerance    Past Surgical History:  Procedure Laterality Date   COLONOSCOPY N/A 12/26/2017   Procedure: COLONOSCOPY;  Surgeon: Benancio Deeds, MD;  Location: [facility] ENDOSCOPY;  Service: Gastroenterology;  Laterality: N/A;   COLONOSCOPY W/ POLYPECTOMY  12/2017   CORONARY ANGIOGRAM  11/13/2014   Procedure: CORONARY ANGIOGRAM;  Surgeon: Laurey Morale, MD;  Location: [facility] CATH LAB;  Service: Cardiovascular;;   CORONARY STENT INTERVENTION N/A 05/18/2019   Procedure: CORONARY  STENT INTERVENTION;  Surgeon: Marykay Lex, MD;  Location: [facility] INVASIVE CV LAB;  Service: Cardiovascular;  Laterality: N/A;   CORONARY STENT PLACEMENT  2004   x2 LAD and RCA    LEFT HEART CATH AND CORONARY ANGIOGRAPHY N/A 05/18/2019   Procedure: LEFT HEART CATH AND CORONARY ANGIOGRAPHY;  Surgeon: Marykay Lex, MD;  Location: [facility] INVASIVE CV LAB;  Service: Cardiovascular;  Laterality: N/A;   LEFT HEART CATH AND CORONARY ANGIOGRAPHY N/A 06/11/2019   Procedure: LEFT HEART CATH AND CORONARY ANGIOGRAPHY;  Surgeon: Yvonne Kendall, MD;  Location: [facility] INVASIVE CV LAB;  Service: Cardiovascular;  Laterality: N/A;   LEFT HEART CATH AND CORONARY ANGIOGRAPHY N/A 10/27/2020   Procedure: LEFT HEART CATH AND CORONARY ANGIOGRAPHY;  Surgeon: Lennette Bihari, MD;  Location: [facility] INVASIVE CV LAB;  Service: Cardiovascular;  Laterality: N/A;   LEFT HEART CATHETERIZATION WITH CORONARY ANGIOGRAM N/A 08/02/2014   Procedure: LEFT HEART CATHETERIZATION WITH CORONARY ANGIOGRAM;  Surgeon: Corky Crafts, MD;  Location: [facility] CATH LAB;  Service: Cardiovascular;  Laterality: N/A;   PROSTATE SURGERY     SPINAL FUSION     L4-S1     A IV Location/Drains/Wounds Patient Lines/Drains/Airways Status     Active Line/Drains/Airways     Name Placement date Placement time Site Days   Peripheral IV 01/02/23 18 G Anterior;Distal;Left;Upper Arm 01/02/23  1346  Arm  less than 1   NG/OG Vented/Dual Lumen 16 Fr. Right nare Marking at nare/corner of mouth 01/02/23  1752  Right nare  less than 1            Intake/Output Last 24 hours No intake or output data in the 24 hours ending 01/02/23 1910  Labs/Imaging Results for orders placed or performed during the hospital encounter of 01/02/23 (from the past 48 hour(s))  CBC with Differential/Platelet     Status: None   Collection Time: 01/02/23  1:37 PM  Result Value Ref Range   WBC 5.1 4.0 - 10.5 K/uL   RBC 4.75 4.22 - 5.81 MIL/uL   Hemoglobin 14.7 13.0 - 17.0 g/dL   HCT  16.1 09.6 - 04.5 %   MCV 90.7 80.0 - 100.0 fL   MCH 30.9 26.0 - 34.0 pg   MCHC 34.1 30.0 - 36.0 g/dL   RDW 40.9 81.1 - 91.4 %   Platelets 279 150 - 400 K/uL   nRBC 0.0 0.0 - 0.2 %   Neutrophils Relative % 61 %   Neutro Abs 3.1 1.7 - 7.7 K/uL   Lymphocytes Relative 16 %   Lymphs Abs 0.8 0.7 - 4.0 K/uL   Monocytes Relative 18 %   Monocytes Absolute 0.9 0.1 - 1.0 K/uL   Eosinophils Relative 5 %   Eosinophils Absolute 0.2 0.0 - 0.5 K/uL   Basophils Relative 0 %   Basophils Absolute 0.0 0.0 - 0.1 K/uL   Immature Granulocytes  0 %   Abs Immature Granulocytes 0.01 0.00 - 0.07 K/uL    Comment: Performed at [facility] Lab, [address] [place], [place] [address]  Lipase, blood     Status: None   Collection Time: 01/02/23  1:37 PM  Result Value Ref Range   Lipase 21 11 - 51 U/L    Comment: Performed at [facility] Lab, [address], [place], [place] [address]  Comprehensive metabolic panel     Status: Abnormal   Collection Time: 01/02/23  1:37 PM  Result Value Ref Range   Sodium 136 135 - 145 mmol/L   Potassium 4.3 3.5 - 5.1 mmol/L   Chloride 100 98 - 111 mmol/L   CO2 21 (L) 22 - 32 mmol/L   Glucose, Bld 170 (H) 70 - 99 mg/dL    Comment: Glucose reference range applies only to samples taken after fasting for at least 8 hours.   BUN 28 (H) 8 - 23 mg/dL   Creatinine, Ser 0.98 (H) 0.61 - 1.24 mg/dL   Calcium 9.8 8.9 - 11.9 mg/dL   Total Protein 6.7 6.5 - 8.1 g/dL   Albumin 4.0 3.5 - 5.0 g/dL   AST 25 15 - 41 U/L   ALT 38 0 - 44 U/L   Alkaline Phosphatase 72 38 - 126 U/L   Total Bilirubin 0.8 0.3 - 1.2 mg/dL   GFR, Estimated 47 (L) >60 mL/min    Comment: (NOTE) Calculated using the CKD-EPI Creatinine Equation (2021)    Anion gap 15 5 - 15    Comment: Performed at [facility] Lab, [address], [place], [place] [address]  Urinalysis, Routine w reflex microscopic -Urine, Clean Catch     Status: Abnormal   Collection Time: 01/02/23  1:37 PM  Result Value Ref Range    Color, Urine YELLOW YELLOW   APPearance CLEAR CLEAR   Specific Gravity, Urine 1.018 1.005 - 1.030   pH 5.0 5.0 - 8.0   Glucose, UA NEGATIVE NEGATIVE mg/dL   Hgb urine dipstick SMALL (A) NEGATIVE   Bilirubin Urine NEGATIVE NEGATIVE   Ketones, ur NEGATIVE NEGATIVE mg/dL   Protein, ur 956 (A) NEGATIVE mg/dL   Nitrite NEGATIVE NEGATIVE   Leukocytes,Ua NEGATIVE NEGATIVE   RBC / HPF 0-5 0 - 5 RBC/hpf   WBC, UA 0-5 0 - 5 WBC/hpf   Bacteria, UA RARE (A) NONE SEEN   Squamous Epithelial / HPF 0-5 0 - 5 /HPF   Mucus PRESENT     Comment: Performed at [facility], [address], [place], [place] [address]   DG Abd Portable 1V-Small Bowel Protocol-Position Verification  Result Date: 01/02/2023 CLINICAL DATA:  Confirm nasogastric tube EXAM: PORTABLE ABDOMEN - 1 VIEW COMPARISON:  CT abdomen and pelvis 01/02/2023 FINDINGS: Nasogastric tube tip is in the gastric fundus. Dilated small bowel loops are again seen compatible with small-bowel obstruction. IMPRESSION: Nasogastric tube tip is in the gastric fundus. Electronically Signed   By: Darliss Cheney M.D.   On: 01/02/2023 18:50   CT ABDOMEN PELVIS W CONTRAST  Result Date: 01/02/2023 CLINICAL DATA:  Acute non localized abdominal pain. EXAM: CT ABDOMEN AND PELVIS WITH CONTRAST TECHNIQUE: Multidetector CT imaging of the abdomen and pelvis was performed using the standard protocol following bolus administration of intravenous contrast. RADIATION DOSE REDUCTION: This exam was performed according to the departmental dose-optimization program which includes automated exposure control, adjustment of the mA and/or kV according to patient size and/or use of  iterative reconstruction technique. CONTRAST:  75mL OMNIPAQUE IOHEXOL 350 MG/ML SOLN COMPARISON:  03/16/2020 FINDINGS: Lower Chest: No acute findings. Hepatobiliary: No hepatic masses identified. Layering sludge or tiny gallstones noted, without evidence of cholecystitis or biliary ductal dilatation. Pancreas:   No mass or inflammatory changes. Spleen: Within normal limits in size and appearance. Adrenals/Urinary Tract: No suspicious masses identified. Multiple small less than 1 cm renal calculi seen bilaterally. No evidence of ureteral calculi or hydronephrosis. A 4 mm calculus is seen within the urinary bladder. Stomach/Bowel: Moderately dilated small bowel loops air-fluid levels are seen, with transition point in the right lateral abdomen. No mass or inflammatory process is seen at this site, and this is suspicious for adhesion. Mild sigmoid diverticulosis is noted, however there is no evidence of diverticulitis. Small amount of free fluid in pelvis, however no evidence of abscess. Vascular/Lymphatic: No pathologically enlarged lymph nodes. No acute vascular findings. Aortic atherosclerotic calcification incidentally noted. Reproductive:  Prior prostatectomy. Other:  None. Musculoskeletal:  No suspicious bone lesions identified. IMPRESSION: Partial small bowel obstruction, with transition point in right lateral abdomen suspicious for adhesion. Small amount of free fluid in pelvis. No evidence of abscess. Bilateral nephrolithiasis and 4 mm bladder calculus. No evidence of ureteral calculi or hydronephrosis. Cholelithiasis versus gallbladder sludge. No radiographic evidence of cholecystitis. Mild sigmoid diverticulosis, without radiographic evidence of diverticulitis. Aortic Atherosclerosis (ICD10-I70.0). Electronically Signed   By: Danae Orleans M.D.   On: 01/02/2023 15:28    Pending Labs Unresulted Labs (From admission, onward)     Start     Ordered   01/03/23 0500  Basic metabolic panel  Tomorrow morning,   R        01/02/23 1715   01/03/23 0500  CBC  Tomorrow morning,   R        01/02/23 1715   01/02/23 1715  Magnesium  Once,   R        01/02/23 1715   01/02/23 1715  TSH  Once,   R        01/02/23 1715            Vitals/Pain Today's Vitals   01/02/23 1326 01/02/23 1540 01/02/23 1623 01/02/23 1719   BP: 131/75     Pulse: 78     Resp: 18     Temp: (!) 97.5 F (36.4 C)   98 F (36.7 C)  TempSrc: Oral     SpO2: 98%     Weight:      Height:      PainSc:  10-Worst pain ever 5      Isolation Precautions No active isolations  Medications Medications  lactated ringers infusion ( Intravenous New Bag/Given 01/02/23 1355)  acetaminophen (TYLENOL) tablet 650 mg (has no administration in time range)    Or  acetaminophen (TYLENOL) suppository 650 mg (has no administration in time range)  morphine (PF) 2 MG/ML injection 2 mg (has no administration in time range)  ondansetron (ZOFRAN) tablet 4 mg (has no administration in time range)    Or  ondansetron (ZOFRAN) injection 4 mg (has no administration in time range)  metoprolol tartrate (LOPRESSOR) injection 5 mg (has no administration in time range)  insulin aspart (novoLOG) injection 0-9 Units (has no administration in time range)  insulin aspart (novoLOG) injection 0-5 Units (has no administration in time range)  diatrizoate meglumine-sodium (GASTROGRAFIN) 66-10 % solution 90 mL (has no administration in time range)  ondansetron (ZOFRAN) injection 4 mg (4 mg Intravenous Given 01/02/23 1354)  iohexol (OMNIPAQUE) 350 MG/ML injection 75 mL (75 mLs Intravenous Contrast Given 01/02/23 1513)  morphine (PF) 4 MG/ML injection 4 mg (4 mg Intravenous Given 01/02/23 1545)  ondansetron (ZOFRAN) injection 4 mg (4 mg Intravenous Given 01/02/23 1545)    Mobility walks     Focused Assessments Abdominal pain, N/V/D since Thursday. Admitting for SBP. NG in place, contrast to be given in 20 minutes for CT scan. Pt A&Ox4 and ambulates independently. Last regular BM 3 days ago.    R Recommendations: See Admitting Provider Note  Report given to:   Additional Notes:

## 2023-01-02 NOTE — Assessment & Plan Note (Signed)
Baseline creatinine 1.2-1.4 Stable, continue gentle IVF and monitor

## 2023-01-02 NOTE — Assessment & Plan Note (Addendum)
Rate controlled, EKG pending Start heparin if patient NPO for longer than 24 hours Otherwise continue eliquis  Last dose 6/16 AM

## 2023-01-02 NOTE — ED Triage Notes (Signed)
Pt bib ems from home c/o abdominal pain since Thursday night. Pt was seen at [facility] Friday. He was prescribed Loperamide. The medication has relieved some diarrhea but the pain still persist. Pt had one episode of emesis Friday. Today pt denies N/V/D. Denies fevers and chills. Pt denies any abdominal surgeries. He had prostate surgery years ago. Pt states his appetite has decreased slightly.  BP130/70 CBG 154 RA 96% Temp 97.9

## 2023-01-02 NOTE — H&P (Signed)
History and Physical    Patient: Joseph [name] BJY:[id number] DOB: Jun 08, 1941 DOA: 01/02/2023 DOS: the patient was seen and examined on 01/02/2023 PCP: Erskine Emery, NP  Patient coming from: Home - lives alone.    Chief Complaint: abdominal pain   HPI: Joseph [name] is a 82 y.o. male with medical history significant of CAD, CKD stage 2-3, HTN, HLD, hx of CVA, T2DM, PAF who presented to ED with complaints of abdominal pain. He states he had acute, generalized abdominal pain that started around 9:00pm on Thursday with episodes of diarrhea. He went to Urgent Care on Friday and was given imodium which helped slow down the diarrhea. He states his abdominal pain was 10/10, no radiation, dull in nature. Nothing made it better, food made it worse. He had an episode of vomiting on Friday morning that was green/bile like in color. He is nauseated, but no more vomiting episodes. He has had no flatus, but had a very small BM this AM that was loose.   No history of abdominal surgeries. Last colonoscopy was 11/2017 where 3 polyps were removed then he had bleeding from site and had repeat cscope on 6/19 due to lower GI bleed from ulcer at prior polypectomy site.     Denies any fever/chills, vision changes/headaches, chest pain or palpitations, shortness of breath or cough,  dysuria or leg swelling.    He does not smoke or drink alcohol.   ER Course:  vitals: temp: 97.5, bp: 131/75, HR: 78, RR: 18, oxygen: 98%RA Pertinent labs: BUN: 28, creatinine: 1.47  CT abdomen: Partial small bowel obstruction, with transition point in right lateral abdomen suspicious for adhesion. Small amount of free fluid in pelvis. No evidence of abscess.  Bilateral nephrolithiasis and 4 mm bladder calculus. No evidence of ureteral calculi or hydronephrosis.  Cholelithiasis versus gallbladder sludge. No radiographic evidence of cholecystitis.  Mild sigmoid diverticulosis, without radiographic evidence of diverticulitis.   Aortic Atherosclerosis  In ED: started on IVF, given pain medication and zofran. TRH asked to admit. NG tube placed.     Review of Systems: As mentioned in the history of present illness. All other systems reviewed and are negative. Past Medical History:  Diagnosis Date   Abdominal discomfort    Abdominal discomfort: EGD 2011 showed no significant abnormalities.  Possibly due to metformin.   Allergic rhinitis    Anticoagulated 05/30/2016   CAD (coronary artery disease)    a. s/p MI in 2004:  stents to the LAD and staged stent RCA, EF 55%;  b.  NSTEMI (1/16):  LHC - mid LAD stent with diff dist restenosis, prox to mid Dx mod dsz jailed by stent, small OM1 99, mCFX 75, dCFX 80, mRCA stent ok, mPDA mild to mod dsz, EF 55% >> PCI:  Synergy DES to LAD; Synergy DES (x2) mid and dist CFX   CAD in native artery 06/12/2019   Chronic kidney disease, stage 2, mildly decreased GFR    stage 2-3   DDD (degenerative disc disease)    L4 to S1 degenerative disease and spondylolisthesis: low back pain.  s/p back surgery   DM (diabetes mellitus) (HCC)    History of CVA (cerebrovascular accident)    Small CVA seen by head CT (incidental) in 1/13.  Carotid dopplers in 1/13 showed minimal disease   History of Doppler ultrasound    Arterial dopplers (3/11): no evidence for significant PAD. ABIs 10/12: Normal.     HLD (hyperlipidemia)    HTN (hypertension)  had side effects with chlorthalidone   Hx of cardiovascular stress test    Myoview (1/13): Small fixed apical septal defect with no ischemia, EF 58%.   Hx of echocardiogram    Echo (3/11): EF 60%, normal wall motion, mild MR, mild left atrial enlargement, mildly dilated ascending aorta.   PAF (paroxysmal atrial fibrillation) (HCC) 05/29/2016   Spondylolisthesis    L4-S1   Statin intolerance    Past Surgical History:  Procedure Laterality Date   COLONOSCOPY N/A 12/26/2017   Procedure: COLONOSCOPY;  Surgeon: Benancio Deeds, MD;  Location: [facility]  ENDOSCOPY;  Service: Gastroenterology;  Laterality: N/A;   COLONOSCOPY W/ POLYPECTOMY  12/2017   CORONARY ANGIOGRAM  11/13/2014   Procedure: CORONARY ANGIOGRAM;  Surgeon: Laurey Morale, MD;  Location: [facility] CATH LAB;  Service: Cardiovascular;;   CORONARY STENT INTERVENTION N/A 05/18/2019   Procedure: CORONARY STENT INTERVENTION;  Surgeon: Marykay Lex, MD;  Location: [facility] INVASIVE CV LAB;  Service: Cardiovascular;  Laterality: N/A;   CORONARY STENT PLACEMENT  2004   x2 LAD and RCA    LEFT HEART CATH AND CORONARY ANGIOGRAPHY N/A 05/18/2019   Procedure: LEFT HEART CATH AND CORONARY ANGIOGRAPHY;  Surgeon: Marykay Lex, MD;  Location: [facility] INVASIVE CV LAB;  Service: Cardiovascular;  Laterality: N/A;   LEFT HEART CATH AND CORONARY ANGIOGRAPHY N/A 06/11/2019   Procedure: LEFT HEART CATH AND CORONARY ANGIOGRAPHY;  Surgeon: Yvonne Kendall, MD;  Location: [facility] INVASIVE CV LAB;  Service: Cardiovascular;  Laterality: N/A;   LEFT HEART CATH AND CORONARY ANGIOGRAPHY N/A 10/27/2020   Procedure: LEFT HEART CATH AND CORONARY ANGIOGRAPHY;  Surgeon: Lennette Bihari, MD;  Location: [facility] INVASIVE CV LAB;  Service: Cardiovascular;  Laterality: N/A;   LEFT HEART CATHETERIZATION WITH CORONARY ANGIOGRAM N/A 08/02/2014   Procedure: LEFT HEART CATHETERIZATION WITH CORONARY ANGIOGRAM;  Surgeon: Corky Crafts, MD;  Location: [facility] CATH LAB;  Service: Cardiovascular;  Laterality: N/A;   PROSTATE SURGERY     SPINAL FUSION     L4-S1   Social History:  reports that he has never smoked. He has never used smokeless tobacco. He reports that he does not drink alcohol and does not use drugs.  Allergies  Allergen Reactions   Bempedoic Acid Other (See Comments)    NEXLETOL Myalgias, GI upset   [name] [Alirocumab] Other (See Comments)    Muscle weakness and rash   Rosuvastatin Other (See Comments)    Low energy/leg and hip pain   Zetia [Ezetimibe] Other (See Comments)    myalgias   Hydralazine Other (See Comments)     Muscle Aches   Keflex [Cephalexin] Other (See Comments)    Reaction unk    Family History  Problem Relation Age of Onset   Heart attack Mother    Heart attack Father    Stroke Neg Hx     Prior to Admission medications   Medication Sig Start Date End Date Taking? Authorizing Provider  acetaminophen (TYLENOL) 500 MG tablet Take 1,000 mg by mouth as needed for moderate pain.    [provider]  amLODipine (NORVASC) 10 MG tablet Take 10 mg by mouth daily. 05/22/20   [provider]  apixaban (ELIQUIS) 5 MG TABS tablet Take 1 tablet (5 mg total) by mouth 2 (two) times daily. 12/09/22   Nahser, Deloris Ping, MD  carvedilol (COREG) 6.25 MG tablet TAKE 1 TABLET BY MOUTH TWICE A DAY 06/29/22   Nahser, Deloris Ping, MD  Cholecalciferol (VITAMIN D3) 25 MCG (1000 UT) CAPS  Take 1,000 Units by mouth daily.    [provider]  clopidogrel (PLAVIX) 75 MG tablet TAKE 1 TABLET BY MOUTH EVERY DAY 08/11/22   Nahser, Deloris Ping, MD  diltiazem (CARDIZEM) 30 MG tablet Take 1 tablet (30 mg total) by mouth as needed (Rapid Palpitations). 01/31/18   Tereso Newcomer T, PA-C  fish oil-omega-3 fatty acids 1000 MG capsule Take 2 g by mouth in the morning and at bedtime.    [provider]  fluticasone (FLONASE) 50 MCG/ACT nasal spray Place 2 sprays into both nostrils daily as needed for allergies or rhinitis.  03/31/15   [provider]  glipiZIDE (GLUCOTROL) 10 MG tablet Take 10 mg by mouth 2 (two) times daily. 01/08/20   [provider]  isosorbide mononitrate (IMDUR) 120 MG 24 hr tablet Take 1 tablet (120 mg total) by mouth daily. Please keep scheduled appointment for future refills. Thank you. 10/28/22   Nahser, Deloris Ping, MD  isosorbide mononitrate (IMDUR) 30 MG 24 hr tablet Take 30 mg by mouth every evening.    [provider]  Multiple Vitamins-Minerals (ZINC PO) Take 1 tablet by mouth daily.    [provider]  nitroGLYCERIN (NITROSTAT) 0.4 MG SL tablet PLACE  1 TAB UNDER THE TONGUE EVERY 5 MINS AS NEEDED FOR CHEST PAIN FOR UP TO 3 DOSES 05/07/22   Nahser, Deloris Ping, MD  olmesartan (BENICAR) 40 MG tablet Take 40 mg by mouth daily. 08/22/20   [provider]  pantoprazole (PROTONIX) 40 MG tablet Take 40 mg by mouth daily. 12/14/22   [provider]  ranolazine (RANEXA) 500 MG 12 hr tablet Take 1 tablet (500 mg total) by mouth 2 (two) times daily. Patient not taking: Reported on 12/22/2022 05/12/21   Nahser, Deloris Ping, MD  TRESIBA FLEXTOUCH 100 UNIT/ML FlexTouch Pen Inject 30-34 Units into the skin daily. 09/10/19   [provider]  triamcinolone cream (KENALOG) 0.5 % Apply 1 application  topically as needed for irritation. 08/24/17   [provider]    Physical Exam: Vitals:   01/02/23 1325 01/02/23 1326 01/02/23 1326 01/02/23 1719  BP:   131/75   Pulse:   78   Resp:   18   Temp: (!) 97.5 F (36.4 C)  (!) 97.5 F (36.4 C) 98 F (36.7 C)  TempSrc: Oral  Oral   SpO2:   98%   Weight:  97.1 kg    Height:  6\' 2"  (1.88 m)     General:  Appears calm and comfortable and is in NAD. Pale appearing  Eyes:  PERRL, EOMI, normal lids, iris ENT:  HOH, lips & tongue, mmm; appropriate dentition Neck:  no LAD, masses or thyromegaly; no carotid bruits Cardiovascular:  RRR, no m/r/g. No LE edema.  Respiratory:   CTA bilaterally with no wheezes/rales/rhonchi.  Normal respiratory effort. Abdomen:  distended, tympanic to percussion. Generalized TTP. BS+ no rebound/guarding.  Back:   normal alignment, no CVAT Skin:  no rash or induration seen on limited exam Musculoskeletal:  grossly normal tone BUE/BLE, good ROM, no bony abnormality Lower extremity:  No LE edema.  Limited foot exam with no ulcerations.  decreased distal pulses. Psychiatric:  grossly normal mood and affect, speech fluent and appropriate, AOx3 Neurologic:  CN 2-12 grossly intact, moves all extremities in coordinated fashion, sensation intact   Radiological Exams on  Admission: Independently reviewed - see discussion in A/P where applicable  CT ABDOMEN PELVIS W CONTRAST  Result Date: 01/02/2023 CLINICAL DATA:  Acute non localized abdominal pain. EXAM: CT ABDOMEN AND PELVIS WITH CONTRAST TECHNIQUE: Multidetector CT imaging of the abdomen and pelvis was performed using the standard protocol following bolus administration of intravenous contrast. RADIATION DOSE REDUCTION: This exam was performed according to the departmental dose-optimization program which includes automated exposure control, adjustment of the mA and/or kV according to patient size and/or use of iterative reconstruction technique. CONTRAST:  75mL OMNIPAQUE IOHEXOL 350 MG/ML SOLN COMPARISON:  03/16/2020 FINDINGS: Lower Chest: No acute findings. Hepatobiliary: No hepatic masses identified. Layering sludge or tiny gallstones noted, without evidence of cholecystitis or biliary ductal dilatation. Pancreas:  No mass or inflammatory changes. Spleen: Within normal limits in size and appearance. Adrenals/Urinary Tract: No suspicious masses identified. Multiple small less than 1 cm renal calculi seen bilaterally. No evidence of ureteral calculi or hydronephrosis. A 4 mm calculus is seen within the urinary bladder. Stomach/Bowel: Moderately dilated small bowel loops air-fluid levels are seen, with transition point in the right lateral abdomen. No mass or inflammatory process is seen at this site, and this is suspicious for adhesion. Mild sigmoid diverticulosis is noted, however there is no evidence of diverticulitis. Small amount of free fluid in pelvis, however no evidence of abscess. Vascular/Lymphatic: No pathologically enlarged lymph nodes. No acute vascular findings. Aortic atherosclerotic calcification incidentally noted. Reproductive:  Prior prostatectomy. Other:  None. Musculoskeletal:  No suspicious bone lesions identified. IMPRESSION: Partial small bowel obstruction, with transition point in right lateral  abdomen suspicious for adhesion. Small amount of free fluid in pelvis. No evidence of abscess. Bilateral nephrolithiasis and 4 mm bladder calculus. No evidence of ureteral calculi or hydronephrosis. Cholelithiasis versus gallbladder sludge. No radiographic evidence of cholecystitis. Mild sigmoid diverticulosis, without radiographic evidence of diverticulitis. Aortic Atherosclerosis (ICD10-I70.0). Electronically Signed   By: Danae Orleans M.D.   On: 01/02/2023 15:28    EKG: pending   Labs on Admission: I have personally reviewed the available labs and imaging studies at the time of the admission.  Pertinent labs:   BUN: 28,  creatinine: 1.47   Assessment and Plan: Principal Problem:   Small bowel obstruction (HCC) Active Problems:   Paroxysmal atrial fibrillation (HCC)   Essential hypertension   Type 2 diabetes mellitus with vascular disease (HCC)   CKD (chronic kidney disease), stage III (HCC)   History of CVA (cerebrovascular accident)   Hyperlipidemia   CAD S/P percutaneous coronary angioplasty    Assessment and Plan: * Small bowel obstruction (HCC) 82 year old with acute onset of generalized abdominal pain and diarrhea x 4 days with associated nausea found to have partial SBO with transition point in right lateral abdomen suspicious for adhesion by CT.  -obs to med-surg  -NPO  -no clinical findings for emergent surgery at this time  -Gentle IV fluid hydration -NG tube with small bowel protocol  -Pain medication -anti-emetics  -encourage ambulation -Monitor electrolytes -General surgery consulted: Dr. Bedelia Person  -no hx of abdominal surgeries.    Paroxysmal atrial fibrillation (HCC) Rate controlled, EKG pending Start heparin if patient NPO for longer than 24 hours Otherwise continue eliquis  Last dose 6/16 AM   Essential hypertension Blood pressure well controlled Continue home regimen when tolerating PO IV lopressor PRN   Type 2 diabetes mellitus with vascular disease  (HCC) A1C of 7.5 this month  Hold long acting insulin while NPO today  SSI with bedtime SS and accuchecks QAC/HS   CKD (chronic kidney disease), stage III (HCC) Baseline creatinine 1.2-1.4 Stable, continue gentle IVF and monitor  History of CVA (cerebrovascular accident) Continue medical management   Hyperlipidemia Statin intolerant Participating in study for orion -PCSK9 drug   CAD S/P percutaneous coronary angioplasty Continue medical management     Advance Care Planning:   Code Status: Full Code   Consults: general surgery: Dr. Bedelia Person   DVT Prophylaxis: SCDs>eliquis/heparin   Family Communication: daughter at bedside   Severity of Illness: The appropriate patient status for this patient is OBSERVATION. Observation status is judged to be reasonable and necessary in order to provide the required intensity of service to ensure the patient's safety. The patient's presenting symptoms, physical exam findings, and initial radiographic and laboratory data in the context of their medical condition is felt to place them at decreased risk for further clinical deterioration. Furthermore, it is anticipated that the patient will be medically stable for discharge from the hospital within 2 midnights of admission.   Author: Orland Mustard, MD 01/02/2023 5:27 PM  For on call review [email or web address].

## 2023-01-03 ENCOUNTER — Observation Stay (HOSPITAL_COMMUNITY): Payer: Medicare Other

## 2023-01-03 DIAGNOSIS — I129 Hypertensive chronic kidney disease with stage 1 through stage 4 chronic kidney disease, or unspecified chronic kidney disease: Secondary | ICD-10-CM | POA: Diagnosis not present

## 2023-01-03 DIAGNOSIS — K5651 Intestinal adhesions [bands], with partial obstruction: Secondary | ICD-10-CM | POA: Diagnosis not present

## 2023-01-03 DIAGNOSIS — E1151 Type 2 diabetes mellitus with diabetic peripheral angiopathy without gangrene: Secondary | ICD-10-CM | POA: Diagnosis not present

## 2023-01-03 DIAGNOSIS — D72829 Elevated white blood cell count, unspecified: Secondary | ICD-10-CM | POA: Diagnosis not present

## 2023-01-03 DIAGNOSIS — Z8673 Personal history of transient ischemic attack (TIA), and cerebral infarction without residual deficits: Secondary | ICD-10-CM | POA: Diagnosis not present

## 2023-01-03 DIAGNOSIS — Z8249 Family history of ischemic heart disease and other diseases of the circulatory system: Secondary | ICD-10-CM | POA: Diagnosis not present

## 2023-01-03 DIAGNOSIS — Z7902 Long term (current) use of antithrombotics/antiplatelets: Secondary | ICD-10-CM | POA: Diagnosis not present

## 2023-01-03 DIAGNOSIS — Z794 Long term (current) use of insulin: Secondary | ICD-10-CM | POA: Diagnosis not present

## 2023-01-03 DIAGNOSIS — Z8546 Personal history of malignant neoplasm of prostate: Secondary | ICD-10-CM | POA: Diagnosis not present

## 2023-01-03 DIAGNOSIS — Z9079 Acquired absence of other genital organ(s): Secondary | ICD-10-CM | POA: Diagnosis not present

## 2023-01-03 DIAGNOSIS — I252 Old myocardial infarction: Secondary | ICD-10-CM | POA: Diagnosis not present

## 2023-01-03 DIAGNOSIS — N1831 Chronic kidney disease, stage 3a: Secondary | ICD-10-CM | POA: Diagnosis not present

## 2023-01-03 DIAGNOSIS — K56609 Unspecified intestinal obstruction, unspecified as to partial versus complete obstruction: Secondary | ICD-10-CM | POA: Diagnosis not present

## 2023-01-03 DIAGNOSIS — Z981 Arthrodesis status: Secondary | ICD-10-CM | POA: Diagnosis not present

## 2023-01-03 DIAGNOSIS — I2510 Atherosclerotic heart disease of native coronary artery without angina pectoris: Secondary | ICD-10-CM | POA: Diagnosis not present

## 2023-01-03 DIAGNOSIS — Z881 Allergy status to other antibiotic agents status: Secondary | ICD-10-CM | POA: Diagnosis not present

## 2023-01-03 DIAGNOSIS — Z602 Problems related to living alone: Secondary | ICD-10-CM | POA: Diagnosis present

## 2023-01-03 DIAGNOSIS — I480 Paroxysmal atrial fibrillation: Secondary | ICD-10-CM | POA: Diagnosis not present

## 2023-01-03 DIAGNOSIS — Z79899 Other long term (current) drug therapy: Secondary | ICD-10-CM | POA: Diagnosis not present

## 2023-01-03 DIAGNOSIS — M5136 Other intervertebral disc degeneration, lumbar region: Secondary | ICD-10-CM | POA: Diagnosis present

## 2023-01-03 DIAGNOSIS — Z7984 Long term (current) use of oral hypoglycemic drugs: Secondary | ICD-10-CM | POA: Diagnosis not present

## 2023-01-03 DIAGNOSIS — E1122 Type 2 diabetes mellitus with diabetic chronic kidney disease: Secondary | ICD-10-CM | POA: Diagnosis not present

## 2023-01-03 DIAGNOSIS — Z888 Allergy status to other drugs, medicaments and biological substances status: Secondary | ICD-10-CM | POA: Diagnosis not present

## 2023-01-03 DIAGNOSIS — E785 Hyperlipidemia, unspecified: Secondary | ICD-10-CM | POA: Diagnosis not present

## 2023-01-03 DIAGNOSIS — R109 Unspecified abdominal pain: Secondary | ICD-10-CM | POA: Diagnosis present

## 2023-01-03 DIAGNOSIS — Z955 Presence of coronary angioplasty implant and graft: Secondary | ICD-10-CM | POA: Diagnosis not present

## 2023-01-03 LAB — BASIC METABOLIC PANEL
Anion gap: 12 (ref 5–15)
BUN: 31 mg/dL — ABNORMAL HIGH (ref 8–23)
CO2: 20 mmol/L — ABNORMAL LOW (ref 22–32)
Calcium: 9.1 mg/dL (ref 8.9–10.3)
Chloride: 100 mmol/L (ref 98–111)
Creatinine, Ser: 1.49 mg/dL — ABNORMAL HIGH (ref 0.61–1.24)
GFR, Estimated: 46 mL/min — ABNORMAL LOW (ref >60)
Glucose, Bld: 194 mg/dL — ABNORMAL HIGH (ref 70–99)
Potassium: 4.1 mmol/L (ref 3.5–5.1)
Sodium: 132 mmol/L — ABNORMAL LOW (ref 135–145)

## 2023-01-03 LAB — CBC
HCT: 39.4 % (ref 39.0–52.0)
Hemoglobin: 13.6 g/dL (ref 13.0–17.0)
MCH: 31.5 pg (ref 26.0–34.0)
MCHC: 34.5 g/dL (ref 30.0–36.0)
MCV: 91.2 fL (ref 80.0–100.0)
Platelets: 253 10*3/uL (ref 150–400)
RBC: 4.32 MIL/uL (ref 4.22–5.81)
RDW: 12.5 % (ref 11.5–15.5)
WBC: 4.4 10*3/uL (ref 4.0–10.5)
nRBC: 0 % (ref 0.0–0.2)

## 2023-01-03 LAB — APTT: aPTT: 60 seconds — ABNORMAL HIGH (ref 24–36)

## 2023-01-03 LAB — GLUCOSE, CAPILLARY
Glucose-Capillary: 144 mg/dL — ABNORMAL HIGH (ref 70–99)
Glucose-Capillary: 134 mg/dL — ABNORMAL HIGH (ref 70–99)
Glucose-Capillary: 110 mg/dL — ABNORMAL HIGH (ref 70–99)
Glucose-Capillary: 125 mg/dL — ABNORMAL HIGH (ref 70–99)

## 2023-01-03 MED ORDER — SODIUM CHLORIDE 0.9 % IV SOLN: INTRAVENOUS | Status: DC

## 2023-01-03 MED ORDER — PHENOL 1.4 % MT LIQD
1.0000 | OROMUCOSAL | Status: DC | PRN
  Administered 2023-01-03: 1 via OROMUCOSAL
  Filled 2023-01-03: qty 177

## 2023-01-03 MED ORDER — HEPARIN (PORCINE) 25000 UT/250ML-% IV SOLN
1500.0000 [IU]/h | INTRAVENOUS | Status: DC
  Administered 2023-01-03: 1450 [IU]/h via INTRAVENOUS
  Administered 2023-01-04: 1550 [IU]/h via INTRAVENOUS
  Administered 2023-01-05: 1650 [IU]/h via INTRAVENOUS
  Filled 2023-01-03 (×3): qty 250

## 2023-01-03 NOTE — Hospital Course (Signed)
Brief hospital course: PMH of CAD, CKD 3A, CVA, type II DM, PAF, HLD, HTN presented to the hospital with complaints of abdominal pain. Found to have SBO. Currently being treated conservatively.  Initially had NG tube for decompression and n.p.o. status.  NG removed on 6/18. Assessment and Plan: Small bowel obstruction. Appreciate surgical consultation. Initially had NG tube with low intermittent suctioning. Treated with IV fluids. NG removed on 6/18.  Multiple bowel movements.  Diet advanced. Reports further distention of the abdomen on 6/19.  Also reports worsening abdominal pain.  Reports multiple episodes of diarrhea last night. X-ray abdomen on 6/19 shows improvement in obstruction. Simethicone added.  Paroxysmal A-fib. Rate controlled. CHA2DS2-VASc score 5.  Started on heparin. Will monitor. Now back on Eliquis.  HTN. Blood pressure stable. Currently on IV Lopressor as needed.  Type II DM, with vascular disease with stroke history, uncontrolled with hyperglycemia with long-acting insulin use. Hemoglobin A1c 7.5.  Currently on sliding scale insulin.  CKD 3A. Serum creatinine stable. Monitor report  History of CVA. Back on Eliquis.  HLD. Statin intolerant.  CAD. Currently no evidence of chest pain. oral antiplatelet medication on hold.

## 2023-01-03 NOTE — Progress Notes (Signed)
[facility] Progress Note Patient: Joseph [name] ZOX:[id number] DOB: 01-30-1941 DOA: 01/02/2023  DOS: the patient was seen and examined on 01/03/2023  Brief hospital course: PMH of CAD, CKD 3A, CVA, type II DM, PAF, HLD, HTN presented to the hospital with complaints of abdominal pain. Found to have SBO. Currently being treated conservatively with NG tube for decompression and n.p.o. status. Assessment and Plan: For bowel obstruction. Appreciate surgical consultation. Continue n.p.o. status for now. NG tube with low intermittent suctioning. Treated with IV fluids. Continue IV pain medication IV nausea medication. Monitor.  Paroxysmal A-fib. Rate controlled. Started on heparin. Will monitor. Hold Eliquis.  HTN. Blood pressure stable. Currently on IV Lopressor as needed.  Type II DM, with vascular disease with stroke history, uncontrolled with hyperglycemia with long-acting insulin use. Hemoglobin A1c 7.5.  Currently on sliding scale insulin.  CKD 3A. Serum creatinine stable. Monitor report  History of CVA. Resume anticoagulation.  HLD. Statin intolerant.  CAD. Currently no evidence of chest pain. Monitor.  Oral antiplatelet medication on hold.  On IV heparin.   Subjective: Abdominal pain still present.  Not passing gas.  No nausea vomiting no fever no chills.  Physical Exam: General: in Mild distress, No Rash Cardiovascular: S1 and S2 Present, No Murmur Respiratory: Good respiratory effort, Bilateral Air entry present. No Crackles, No wheezes Abdomen: Bowel Sound sluggish, No tenderness Extremities: No edema Neuro: Alert and oriented x3, no new focal deficit  Data Reviewed: I have Reviewed nursing notes, Vitals, and Lab results. Since last encounter, pertinent lab results CBC and BMP   . I have ordered test including CBC and BMP  .  Disposition: Status is: Inpatient Remains inpatient appropriate because: Need for IV therapy due to n.p.o. status  SCDs  Start: 01/02/23 1715   Family Communication: Family at bedside Level of care: Med-Surg   Vitals:   01/02/23 2359 01/03/23 0453 01/03/23 0455 01/03/23 0755  BP: 121/61 132/66  133/75  Pulse: 87 92 87 87  Resp: 18 20  18   Temp: 98.2 F (36.8 C) 98.2 F (36.8 C)  (!) 97.5 F (36.4 C)  TempSrc: Oral Oral    SpO2: 95% 92% 93% 93%  Weight:      Height:         Author: Lynden Oxford, MD 01/03/2023 5:57 PM  Please look on [email or web address] to find out who is on call.

## 2023-01-03 NOTE — Progress Notes (Signed)
  Subjective No acute events. Feels better than yesterday. No flatus or BM to report. No n/v. Throat sore from NG.   Objective: Vital signs in last 24 hours: Temp:  [97.5 F (36.4 C)-98.2 F (36.8 C)] 97.5 F (36.4 C) (06/17 0755) Pulse Rate:  [68-92] 87 (06/17 0755) Resp:  [18-20] 18 (06/17 0755) BP: (105-133)/(61-75) 133/75 (06/17 0755) SpO2:  [92 %-98 %] 93 % (06/17 0755) Weight:  [97.1 kg-99.8 kg] 99.8 kg (06/16 1956) Last BM Date :  (6/16 PTA small loose BM per pt report)  Intake/Output from previous day: 06/16 0701 - 06/17 0700 In: 817 [I.V.:727; NG/GT:90] Out: 700 [Emesis/NG output:700] Intake/Output this shift: No intake/output data recorded.  Gen: NAD, comfortable CV: RRR Pulm: Normal work of breathing Abd: Soft, nontender throughout; moderate distention Ext: SCDs in place  Lab Results: CBC  Recent Labs    01/02/23 1337 01/03/23 0058  WBC 5.1 4.4  HGB 14.7 13.6  HCT 43.1 39.4  PLT 279 253   BMET Recent Labs    01/02/23 1337 01/03/23 0058  NA 136 132*  K 4.3 4.1  CL 100 100  CO2 21* 20*  GLUCOSE 170* 194*  BUN 28* 31*  CREATININE 1.47* 1.49*  CALCIUM 9.8 9.1   PT/INR No results for input(s): "LABPROT", "INR" in the last 72 hours. ABG No results for input(s): "PHART", "HCO3" in the last 72 hours.  Invalid input(s): "PCO2", "PO2"  Studies/Results:  Anti-infectives: Anti-infectives (From admission, onward)    None        Assessment/Plan: Patient Active Problem List   Diagnosis Date Noted   CKD (chronic kidney disease), stage III (HCC) 01/02/2023   Small bowel obstruction (HCC) 01/02/2023   CAD in native artery 06/12/2019   Unstable angina (HCC) 05/17/2019   Lower GI bleed 12/26/2017   Postural dizziness with presyncope 12/26/2017   Coronary artery disease of native artery of native heart with stable angina pectoris (HCC) 04/11/2017   Hyperlipidemia 04/11/2017   Anticoagulated 05/30/2016   Paroxysmal atrial fibrillation (HCC)  05/29/2016   Exertional angina (HCC) 08/02/2014   Bradycardia 02/02/2012   History of CVA (cerebrovascular accident) 08/01/2011   Leg pain 03/31/2011   Abdominal pain 10/13/2010   Hip pain 10/13/2010   Essential hypertension 09/09/2009   Peripheral vascular disease (HCC) 09/09/2009   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   CAD S/P percutaneous coronary angioplasty 09/08/2009   SBO, presumed adhesive - hx of 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  Marin Olp, MD [facility], [facility]

## 2023-01-03 NOTE — Progress Notes (Signed)
ANTICOAGULATION CONSULT NOTE - Initial Consult  Pharmacy Consult for heparin  Indication: atrial fibrillation  Allergies  Allergen Reactions   Bempedoic Acid Other (See Comments)    NEXLETOL Myalgias, GI upset   [name] [Alirocumab] Other (See Comments)    Muscle weakness and rash   Rosuvastatin Other (See Comments)    Low energy/leg and hip pain   Zetia [Ezetimibe] Other (See Comments)    myalgias   Hydralazine Other (See Comments)    Muscle Aches   Keflex [Cephalexin] Other (See Comments)    Reaction unk    Patient Measurements: Height: 6\' 2"  (188 cm) Weight: 99.8 kg (220 lb 0.3 oz) IBW/kg (Calculated) : 82.2 Heparin Dosing Weight: 98 kg  Vital Signs: Temp: 97.5 F (36.4 C) (06/17 0755) Temp Source: Oral (06/17 0453) BP: 133/75 (06/17 0755) Pulse Rate: 87 (06/17 0755)  Labs: Recent Labs    01/02/23 1337 01/03/23 0058  HGB 14.7 13.6  HCT 43.1 39.4  PLT 279 253  CREATININE 1.47* 1.49*    Estimated Creatinine Clearance: 46.6 mL/min (A) (by C-G formula based on SCr of 1.49 mg/dL (H)).   Medical History: Past Medical History:  Diagnosis Date   Abdominal discomfort    Abdominal discomfort: EGD 2011 showed no significant abnormalities.  Possibly due to metformin.   Allergic rhinitis    Anticoagulated 05/30/2016   CAD (coronary artery disease)    a. s/p MI in 2004:  stents to the LAD and staged stent RCA, EF 55%;  b.  NSTEMI (1/16):  LHC - mid LAD stent with diff dist restenosis, prox to mid Dx mod dsz jailed by stent, small OM1 99, mCFX 75, dCFX 80, mRCA stent ok, mPDA mild to mod dsz, EF 55% >> PCI:  Synergy DES to LAD; Synergy DES (x2) mid and dist CFX   CAD in native artery 06/12/2019   Chronic kidney disease, stage 2, mildly decreased GFR    stage 2-3   DDD (degenerative disc disease)    L4 to S1 degenerative disease and spondylolisthesis: low back pain.  s/p back surgery   DM (diabetes mellitus) (HCC)    History of CVA (cerebrovascular accident)     Small CVA seen by head CT (incidental) in 1/13.  Carotid dopplers in 1/13 showed minimal disease   History of Doppler ultrasound    Arterial dopplers (3/11): no evidence for significant PAD. ABIs 10/12: Normal.     HLD (hyperlipidemia)    HTN (hypertension)    had side effects with chlorthalidone   Hx of cardiovascular stress test    Myoview (1/13): Small fixed apical septal defect with no ischemia, EF 58%.   Hx of echocardiogram    Echo (3/11): EF 60%, normal wall motion, mild MR, mild left atrial enlargement, mildly dilated ascending aorta.   PAF (paroxysmal atrial fibrillation) (HCC) 05/29/2016   Spondylolisthesis    L4-S1   Statin intolerance      Assessment: [name] on apixaban PTA for afib and now admitted with SBO.  Last dose of apixaban 6/16. Pharmacy consulted for heparin.    Goal of Therapy:  Heparin level 0.3-0.7 units/ml Monitor platelets by anticoagulation protocol: Yes   Plan:  Start Heparin 1450 units/hr, no bolus F/u 8hr aPTT F/u aPTT until correlates with heparin level  Monitor daily aPTT, heparin level, CBC Monitor for signs/symptoms of bleeding    Alphia Moh, PharmD, BCPS, BCCP Clinical Pharmacist  Please check AMION for all [facility] Pharmacy phone numbers After 10:00 PM, call Main Pharmacy  [phone]  

## 2023-01-03 NOTE — Plan of Care (Signed)

## 2023-01-03 NOTE — Progress Notes (Signed)
Pt arrived to the unit via stretcher accompanied by daughter and transporter. Pt oriented to room, call bell and surrounding. Per daughter patient has a healthcare living will. Requested daughter to bring a copy to the hospital.   Pt has cellphone with charger and upper and lower denture at bedside. Pt informed of personal belonging policy. Denture cup with patient sticker provided with education that dentures needs to be in the cup if not in use.   Pt expresses understanding.

## 2023-01-03 NOTE — Plan of Care (Signed)

## 2023-01-04 ENCOUNTER — Inpatient Hospital Stay (HOSPITAL_COMMUNITY): Payer: Medicare Other

## 2023-01-04 DIAGNOSIS — K56609 Unspecified intestinal obstruction, unspecified as to partial versus complete obstruction: Secondary | ICD-10-CM | POA: Diagnosis not present

## 2023-01-04 LAB — APTT
aPTT: 55 seconds — ABNORMAL HIGH (ref 24–36)
aPTT: 104 seconds — ABNORMAL HIGH (ref 24–36)

## 2023-01-04 LAB — COMPREHENSIVE METABOLIC PANEL
ALT: 27 U/L (ref 0–44)
AST: 20 U/L (ref 15–41)
Albumin: 3.1 g/dL — ABNORMAL LOW (ref 3.5–5.0)
Alkaline Phosphatase: 58 U/L (ref 38–126)
Anion gap: 11 (ref 5–15)
BUN: 30 mg/dL — ABNORMAL HIGH (ref 8–23)
CO2: 22 mmol/L (ref 22–32)
Calcium: 8.6 mg/dL — ABNORMAL LOW (ref 8.9–10.3)
Chloride: 106 mmol/L (ref 98–111)
Creatinine, Ser: 1.34 mg/dL — ABNORMAL HIGH (ref 0.61–1.24)
GFR, Estimated: 52 mL/min — ABNORMAL LOW (ref >60)
Glucose, Bld: 154 mg/dL — ABNORMAL HIGH (ref 70–99)
Potassium: 4.3 mmol/L (ref 3.5–5.1)
Sodium: 139 mmol/L (ref 135–145)
Total Bilirubin: 0.9 mg/dL (ref 0.3–1.2)
Total Protein: 5.7 g/dL — ABNORMAL LOW (ref 6.5–8.1)

## 2023-01-04 LAB — CBC
HCT: 39.4 % (ref 39.0–52.0)
Hemoglobin: 13.6 g/dL (ref 13.0–17.0)
MCH: 31.7 pg (ref 26.0–34.0)
MCHC: 34.5 g/dL (ref 30.0–36.0)
MCV: 91.8 fL (ref 80.0–100.0)
Platelets: 248 10*3/uL (ref 150–400)
RBC: 4.29 MIL/uL (ref 4.22–5.81)
RDW: 12.6 % (ref 11.5–15.5)
WBC: 8.9 10*3/uL (ref 4.0–10.5)
nRBC: 0 % (ref 0.0–0.2)

## 2023-01-04 LAB — GLUCOSE, CAPILLARY
Glucose-Capillary: 152 mg/dL — ABNORMAL HIGH (ref 70–99)
Glucose-Capillary: 151 mg/dL — ABNORMAL HIGH (ref 70–99)
Glucose-Capillary: 120 mg/dL — ABNORMAL HIGH (ref 70–99)
Glucose-Capillary: 162 mg/dL — ABNORMAL HIGH (ref 70–99)

## 2023-01-04 LAB — HEPARIN LEVEL (UNFRACTIONATED): Heparin Unfractionated: >1.1 IU/mL — ABNORMAL HIGH (ref 0.30–0.70)

## 2023-01-04 LAB — MAGNESIUM: Magnesium: 1.8 mg/dL (ref 1.7–2.4)

## 2023-01-04 NOTE — Progress Notes (Signed)
ANTICOAGULATION CONSULT NOTE  Pharmacy Consult for heparin  Indication: atrial fibrillation  Allergies  Allergen Reactions   Bempedoic Acid Other (See Comments)    NEXLETOL Myalgias, GI upset   [name] [Alirocumab] Other (See Comments)    Muscle weakness and rash   Rosuvastatin Other (See Comments)    Low energy/leg and hip pain   Zetia [Ezetimibe] Other (See Comments)    myalgias   Hydralazine Other (See Comments)    Muscle Aches   Keflex [Cephalexin] Other (See Comments)    Reaction unk    Patient Measurements: Height: 6\' 2"  (188 cm) Weight: 99.8 kg (220 lb 0.3 oz) IBW/kg (Calculated) : 82.2 Heparin Dosing Weight: 98 kg  Vital Signs: Temp: 98.6 F (37 C) (06/18 2246) Temp Source: Oral (06/18 2246) BP: 132/64 (06/18 2246) Pulse Rate: 84 (06/18 2246)  Labs: Recent Labs    01/02/23 1337 01/03/23 0058 01/03/23 2322 01/04/23 0842  HGB 14.7 13.6  --  13.6  HCT 43.1 39.4  --  39.4  PLT 279 253  --  248  APTT  --   --  60* 55*  HEPARINUNFRC  --   --   --  >1.10*  CREATININE 1.47* 1.49*  --  1.34*    Estimated Creatinine Clearance: 51.8 mL/min (A) (by C-G formula based on SCr of 1.34 mg/dL (H)).   Medical History: Past Medical History:  Diagnosis Date   Abdominal discomfort    Abdominal discomfort: EGD 2011 showed no significant abnormalities.  Possibly due to metformin.   Allergic rhinitis    Anticoagulated 05/30/2016   CAD (coronary artery disease)    a. s/p MI in 2004:  stents to the LAD and staged stent RCA, EF 55%;  b.  NSTEMI (1/16):  LHC - mid LAD stent with diff dist restenosis, prox to mid Dx mod dsz jailed by stent, small OM1 99, mCFX 75, dCFX 80, mRCA stent ok, mPDA mild to mod dsz, EF 55% >> PCI:  Synergy DES to LAD; Synergy DES (x2) mid and dist CFX   CAD in native artery 06/12/2019   Chronic kidney disease, stage 2, mildly decreased GFR    stage 2-3   DDD (degenerative disc disease)    L4 to S1 degenerative disease and spondylolisthesis: low  back pain.  s/p back surgery   DM (diabetes mellitus) (HCC)    History of CVA (cerebrovascular accident)    Small CVA seen by head CT (incidental) in 1/13.  Carotid dopplers in 1/13 showed minimal disease   History of Doppler ultrasound    Arterial dopplers (3/11): no evidence for significant PAD. ABIs 10/12: Normal.     HLD (hyperlipidemia)    HTN (hypertension)    had side effects with chlorthalidone   Hx of cardiovascular stress test    Myoview (1/13): Small fixed apical septal defect with no ischemia, EF 58%.   Hx of echocardiogram    Echo (3/11): EF 60%, normal wall motion, mild MR, mild left atrial enlargement, mildly dilated ascending aorta.   PAF (paroxysmal atrial fibrillation) (HCC) 05/29/2016   Spondylolisthesis    L4-S1   Statin intolerance      Assessment: [name] on apixaban PTA for afib and now admitted with SBO. Last dose of apixaban 6/16. Pharmacy consulted to transition to heparin infusion.   aPTT 104 is just supratherapeutic on 1750 units/hr.   Goal of Therapy:  Heparin level 0.3-0.7 units/ml aPTT 66-102 seconds Monitor platelets by anticoagulation protocol: Yes   Plan:  Decrease  heparin to 1650 units/hr  Monitor daily heparin level and aPTT until correlating, CBC, s/sx bleeding Resume apixaban as appropriate  Alphia Moh, PharmD, BCPS, BCCP Clinical Pharmacist  Please check AMION for all [facility] Pharmacy phone numbers After 10:00 PM, call Main Pharmacy [phone]

## 2023-01-04 NOTE — Progress Notes (Signed)
[facility] Progress Note Patient: Joseph [name] ZOX:[id number] DOB: Nov 07, 1940 DOA: 01/02/2023  DOS: the patient was seen and examined on 01/04/2023  Brief hospital course: PMH of CAD, CKD 3A, CVA, type II DM, PAF, HLD, HTN presented to the hospital with complaints of abdominal pain. Found to have SBO. Currently being treated conservatively.  Initially had NG tube for decompression and n.p.o. status.  NG removed on 6/18. Assessment and Plan: Small bowel obstruction. Appreciate surgical consultation. Initially had NG tube with low intermittent suctioning. Treated with IV fluids. NG removed on 6/18.  Multiple bowel movements.  Diet advanced.  Paroxysmal A-fib. Rate controlled. CHA2DS2-VASc score 5.  Started on heparin. Will monitor. Hold Eliquis.  HTN. Blood pressure stable. Currently on IV Lopressor as needed.  Type II DM, with vascular disease with stroke history, uncontrolled with hyperglycemia with long-acting insulin use. Hemoglobin A1c 7.5.  Currently on sliding scale insulin.  CKD 3A. Serum creatinine stable. Monitor report  History of CVA. Holding Eliquis, currently on IV heparin.  HLD. Statin intolerant.  CAD. Currently no evidence of chest pain. Monitor.  Oral antiplatelet medication on hold.  On IV heparin.   Subjective: No abdominal pain.  Passing gas.  No nausea no vomiting.  Tolerating liquid diet so far.  Physical Exam: In mild distress.  No rash. S1-S2 present bowel sound present. No edema.  Seen ambulating in the hallway.  Data Reviewed: I have Reviewed nursing notes, Vitals, and Lab results. Reviewed CBC and BMP.  Reordered CBC and BMP.  Disposition: Status is: Inpatient Remains inpatient appropriate because: Monitor for stability of improvement in SBO. On IV heparin right now.  Likely home tomorrow.  SCDs Start: 01/02/23 1715   Family Communication: Family at bedside Level of care: Med-Surg   Vitals:   01/03/23 2006 01/04/23 0415  01/04/23 0819 01/04/23 1620  BP: 133/71 132/74 (!) 146/65 125/75  Pulse: 89 85 83 84  Resp:  18 16 19   Temp: 99.4 F (37.4 C) 98.8 F (37.1 C) (!) 97.5 F (36.4 C) 98 F (36.7 C)  TempSrc: Oral Oral Oral Oral  SpO2: 90% 92% 94% 97%  Weight:      Height:         Author: Lynden Oxford, MD 01/04/2023 7:15 PM  Please look on [email or web address] to find out who is on call.

## 2023-01-04 NOTE — Progress Notes (Signed)
ANTICOAGULATION CONSULT NOTE  Pharmacy Consult for heparin  Indication: atrial fibrillation Brief A/P: aPTT subtherapeutic Increase Heparin rate  Allergies  Allergen Reactions   Bempedoic Acid Other (See Comments)    NEXLETOL Myalgias, GI upset   [name] [Alirocumab] Other (See Comments)    Muscle weakness and rash   Rosuvastatin Other (See Comments)    Low energy/leg and hip pain   Zetia [Ezetimibe] Other (See Comments)    myalgias   Hydralazine Other (See Comments)    Muscle Aches   Keflex [Cephalexin] Other (See Comments)    Reaction unk    Patient Measurements: Height: 6\' 2"  (188 cm) Weight: 99.8 kg (220 lb 0.3 oz) IBW/kg (Calculated) : 82.2 Heparin Dosing Weight: 98 kg  Vital Signs: Temp: 99.4 F (37.4 C) (06/17 2006) Temp Source: Oral (06/17 2006) BP: 133/71 (06/17 2006) Pulse Rate: 89 (06/17 2006)  Labs: Recent Labs    01/02/23 1337 01/03/23 0058 01/03/23 2322  HGB 14.7 13.6  --   HCT 43.1 39.4  --   PLT 279 253  --   APTT  --   --  60*  CREATININE 1.47* 1.49*  --      Estimated Creatinine Clearance: 46.6 mL/min (A) (by C-G formula based on SCr of 1.49 mg/dL (H)).  Assessment: 82 y.o. male with h/o Afib, Eliquis on hold for heparin  Goal of Therapy:  Heparin level 0.3-0.7 units/ml Monitor platelets by anticoagulation protocol: Yes   Plan:  Increase Heparin 1550 units/hr Check heparin level in 8 hours.   Geannie Risen, PharmD, BCPS

## 2023-01-04 NOTE — Progress Notes (Signed)
ANTICOAGULATION CONSULT NOTE  Pharmacy Consult for heparin  Indication: atrial fibrillation  Allergies  Allergen Reactions   Bempedoic Acid Other (See Comments)    NEXLETOL Myalgias, GI upset   [name] [Alirocumab] Other (See Comments)    Muscle weakness and rash   Rosuvastatin Other (See Comments)    Low energy/leg and hip pain   Zetia [Ezetimibe] Other (See Comments)    myalgias   Hydralazine Other (See Comments)    Muscle Aches   Keflex [Cephalexin] Other (See Comments)    Reaction unk    Patient Measurements: Height: 6\' 2"  (188 cm) Weight: 99.8 kg (220 lb 0.3 oz) IBW/kg (Calculated) : 82.2 Heparin Dosing Weight: 98 kg  Vital Signs: Temp: 97.5 F (36.4 C) (06/18 0819) Temp Source: Oral (06/18 0819) BP: 146/65 (06/18 0819) Pulse Rate: 83 (06/18 0819)  Labs: Recent Labs    01/02/23 1337 01/03/23 0058 01/03/23 2322 01/04/23 0842  HGB 14.7 13.6  --  13.6  HCT 43.1 39.4  --  39.4  PLT 279 253  --  248  APTT  --   --  60* 55*  HEPARINUNFRC  --   --   --  >1.10*  CREATININE 1.47* 1.49*  --  1.34*     Estimated Creatinine Clearance: 51.8 mL/min (A) (by C-G formula based on SCr of 1.34 mg/dL (H)).   Medical History: Past Medical History:  Diagnosis Date   Abdominal discomfort    Abdominal discomfort: EGD 2011 showed no significant abnormalities.  Possibly due to metformin.   Allergic rhinitis    Anticoagulated 05/30/2016   CAD (coronary artery disease)    a. s/p MI in 2004:  stents to the LAD and staged stent RCA, EF 55%;  b.  NSTEMI (1/16):  LHC - mid LAD stent with diff dist restenosis, prox to mid Dx mod dsz jailed by stent, small OM1 99, mCFX 75, dCFX 80, mRCA stent ok, mPDA mild to mod dsz, EF 55% >> PCI:  Synergy DES to LAD; Synergy DES (x2) mid and dist CFX   CAD in native artery 06/12/2019   Chronic kidney disease, stage 2, mildly decreased GFR    stage 2-3   DDD (degenerative disc disease)    L4 to S1 degenerative disease and spondylolisthesis:  low back pain.  s/p back surgery   DM (diabetes mellitus) (HCC)    History of CVA (cerebrovascular accident)    Small CVA seen by head CT (incidental) in 1/13.  Carotid dopplers in 1/13 showed minimal disease   History of Doppler ultrasound    Arterial dopplers (3/11): no evidence for significant PAD. ABIs 10/12: Normal.     HLD (hyperlipidemia)    HTN (hypertension)    had side effects with chlorthalidone   Hx of cardiovascular stress test    Myoview (1/13): Small fixed apical septal defect with no ischemia, EF 58%.   Hx of echocardiogram    Echo (3/11): EF 60%, normal wall motion, mild MR, mild left atrial enlargement, mildly dilated ascending aorta.   PAF (paroxysmal atrial fibrillation) (HCC) 05/29/2016   Spondylolisthesis    L4-S1   Statin intolerance      Assessment: [name] on apixaban PTA for afib and now admitted with SBO. Last dose of apixaban 6/16. Pharmacy consulted to transition to heparin infusion.   aPTT remains subtherapeutic - down to 55 this morning. Heparin level elevated, as expected, due to influence of apixaban. CBC WNL. No bleeding or issues with infusion per discussion with RN.  Goal of Therapy:  Heparin level 0.3-0.7 units/ml aPTT 66-102 seconds Monitor platelets by anticoagulation protocol: Yes   Plan:  Increase heparin to 1750 units/hr Check 8hr aPTT Monitor daily heparin level and aPTT until correlating, CBC, s/sx bleeding Resume apixaban as appropriate   Leia Alf, PharmD, BCPS Please check AMION for all [facility] Pharmacy contact numbers Clinical Pharmacist 01/04/2023 11:34 AM

## 2023-01-04 NOTE — Progress Notes (Signed)
  Subjective No acute events. Feels better. 5 BM documented  Objective: Vital signs in last 24 hours: Temp:  [97.5 F (36.4 C)-99.4 F (37.4 C)] 97.5 F (36.4 C) (06/18 0819) Pulse Rate:  [83-89] 83 (06/18 0819) Resp:  [16-18] 16 (06/18 0819) BP: (132-146)/(65-74) 146/65 (06/18 0819) SpO2:  [90 %-94 %] 94 % (06/18 0819) Last BM Date : 01/04/23  Intake/Output from previous day: 06/17 0701 - 06/18 0700 In: 2007.7 [I.V.:1827.7; NG/GT:180] Out: 300 [Emesis/NG output:300] Intake/Output this shift: No intake/output data recorded.  Gen: NAD, comfortable CV: RRR Pulm: Normal work of breathing Abd: Soft, nontender throughout; not significantly distended Ext: SCDs in place  Lab Results: CBC  Recent Labs    01/02/23 1337 01/03/23 0058  WBC 5.1 4.4  HGB 14.7 13.6  HCT 43.1 39.4  PLT 279 253   BMET Recent Labs    01/02/23 1337 01/03/23 0058  NA 136 132*  K 4.3 4.1  CL 100 100  CO2 21* 20*  GLUCOSE 170* 194*  BUN 28* 31*  CREATININE 1.47* 1.49*  CALCIUM 9.8 9.1   PT/INR No results for input(s): "LABPROT", "INR" in the last 72 hours. ABG No results for input(s): "PHART", "HCO3" in the last 72 hours.  Invalid input(s): "PCO2", "PO2"  Studies/Results:  Anti-infectives: Anti-infectives (From admission, onward)    None        Assessment/Plan: Patient Active Problem List   Diagnosis Date Noted   SBO (small bowel obstruction) (HCC) 01/03/2023   CKD (chronic kidney disease), stage III (HCC) 01/02/2023   Small bowel obstruction (HCC) 01/02/2023   CAD in native artery 06/12/2019   Unstable angina (HCC) 05/17/2019   Lower GI bleed 12/26/2017   Postural dizziness with presyncope 12/26/2017   Coronary artery disease of native artery of native heart with stable angina pectoris (HCC) 04/11/2017   Hyperlipidemia 04/11/2017   Anticoagulated 05/30/2016   Paroxysmal atrial fibrillation (HCC) 05/29/2016   Exertional angina (HCC) 08/02/2014   Bradycardia 02/02/2012    History of CVA (cerebrovascular accident) 08/01/2011   Leg pain 03/31/2011   Abdominal pain 10/13/2010   Hip pain 10/13/2010   Essential hypertension 09/09/2009   Peripheral vascular disease (HCC) 09/09/2009   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   CAD S/P percutaneous coronary angioplasty 09/08/2009   SBO, presumed adhesive - hx of prior open prostatectomy ~30 yrs ago for prostate CA  Prior CAD, CKD stage II/III, HTN, HLD, CVA, DM, PAF  Contrast now in colon, having multiple BM  - D/C NG tube; liquids, advance to solids as tolerated - We will follow with you - Ppx: SCDs ok for IV heparin but would not administer Xarelto/Eliquis; hold plavix if ok with cards   LOS: 1 day   I spent a total of 35 minutes in both face-to-face and non-face-to-face activities, excluding procedures performed, for this visit on the date of this encounter.  Marin Olp, MD [facility], [facility]

## 2023-01-05 ENCOUNTER — Inpatient Hospital Stay (HOSPITAL_COMMUNITY): Payer: Medicare Other

## 2023-01-05 DIAGNOSIS — K56609 Unspecified intestinal obstruction, unspecified as to partial versus complete obstruction: Secondary | ICD-10-CM | POA: Diagnosis not present

## 2023-01-05 LAB — GLUCOSE, CAPILLARY
Glucose-Capillary: 143 mg/dL — ABNORMAL HIGH (ref 70–99)
Glucose-Capillary: 160 mg/dL — ABNORMAL HIGH (ref 70–99)
Glucose-Capillary: 119 mg/dL — ABNORMAL HIGH (ref 70–99)
Glucose-Capillary: 164 mg/dL — ABNORMAL HIGH (ref 70–99)

## 2023-01-05 LAB — BASIC METABOLIC PANEL
Anion gap: 10 (ref 5–15)
BUN: 24 mg/dL — ABNORMAL HIGH (ref 8–23)
CO2: 18 mmol/L — ABNORMAL LOW (ref 22–32)
Calcium: 8.4 mg/dL — ABNORMAL LOW (ref 8.9–10.3)
Chloride: 106 mmol/L (ref 98–111)
Creatinine, Ser: 1.14 mg/dL (ref 0.61–1.24)
GFR, Estimated: >60 mL/min (ref >60)
Glucose, Bld: 160 mg/dL — ABNORMAL HIGH (ref 70–99)
Potassium: 3.5 mmol/L (ref 3.5–5.1)
Sodium: 134 mmol/L — ABNORMAL LOW (ref 135–145)

## 2023-01-05 LAB — APTT: aPTT: 145 seconds — ABNORMAL HIGH (ref 24–36)

## 2023-01-05 LAB — CBC
HCT: 37.7 % — ABNORMAL LOW (ref 39.0–52.0)
Hemoglobin: 13.2 g/dL (ref 13.0–17.0)
MCH: 31.2 pg (ref 26.0–34.0)
MCHC: 35 g/dL (ref 30.0–36.0)
MCV: 89.1 fL (ref 80.0–100.0)
Platelets: 239 10*3/uL (ref 150–400)
RBC: 4.23 MIL/uL (ref 4.22–5.81)
RDW: 12.6 % (ref 11.5–15.5)
WBC: 9.9 10*3/uL (ref 4.0–10.5)
nRBC: 0 % (ref 0.0–0.2)

## 2023-01-05 LAB — MAGNESIUM: Magnesium: 1.7 mg/dL (ref 1.7–2.4)

## 2023-01-05 LAB — HEPARIN LEVEL (UNFRACTIONATED): Heparin Unfractionated: >1.1 IU/mL — ABNORMAL HIGH (ref 0.30–0.70)

## 2023-01-05 MED ORDER — SIMETHICONE 80 MG PO CHEW
80.0000 mg | CHEWABLE_TABLET | Freq: Four times a day (QID) | ORAL | Status: DC
  Administered 2023-01-05 – 2023-01-06 (×5): 80 mg via ORAL
  Filled 2023-01-05 (×5): qty 1

## 2023-01-05 MED ORDER — APIXABAN 5 MG PO TABS: 5.0000 mg | ORAL_TABLET | Freq: Two times a day (BID) | ORAL | Status: DC

## 2023-01-05 MED ORDER — CARVEDILOL 6.25 MG PO TABS
6.2500 mg | ORAL_TABLET | Freq: Two times a day (BID) | ORAL | Status: DC
  Administered 2023-01-05 – 2023-01-06 (×3): 6.25 mg via ORAL
  Filled 2023-01-05 (×3): qty 1

## 2023-01-05 MED ORDER — ISOSORBIDE MONONITRATE ER 30 MG PO TB24
30.0000 mg | ORAL_TABLET | Freq: Every evening | ORAL | Status: DC
  Administered 2023-01-05 – 2023-01-06 (×2): 30 mg via ORAL
  Filled 2023-01-05 (×2): qty 1

## 2023-01-05 MED ORDER — APIXABAN 5 MG PO TABS
5.0000 mg | ORAL_TABLET | Freq: Two times a day (BID) | ORAL | Status: DC
  Administered 2023-01-05 – 2023-01-06 (×3): 5 mg via ORAL
  Filled 2023-01-05 (×3): qty 1

## 2023-01-05 MED ORDER — CLOPIDOGREL BISULFATE 75 MG PO TABS: 75.0000 mg | ORAL_TABLET | Freq: Every day | ORAL | Status: DC

## 2023-01-05 MED ORDER — SACCHAROMYCES BOULARDII 250 MG PO CAPS
250.0000 mg | ORAL_CAPSULE | Freq: Two times a day (BID) | ORAL | Status: DC
  Administered 2023-01-05 – 2023-01-06 (×3): 250 mg via ORAL
  Filled 2023-01-05 (×3): qty 1

## 2023-01-05 MED ORDER — CLOPIDOGREL BISULFATE 75 MG PO TABS
75.0000 mg | ORAL_TABLET | Freq: Every day | ORAL | Status: DC
  Administered 2023-01-06: 75 mg via ORAL
  Filled 2023-01-05: qty 1

## 2023-01-05 MED ORDER — PANTOPRAZOLE SODIUM 40 MG PO TBEC
40.0000 mg | DELAYED_RELEASE_TABLET | Freq: Every day | ORAL | Status: DC
  Administered 2023-01-05 – 2023-01-06 (×2): 40 mg via ORAL
  Filled 2023-01-05 (×2): qty 1

## 2023-01-05 NOTE — Progress Notes (Signed)
ANTICOAGULATION CONSULT NOTE  Pharmacy Consult for heparin  Indication: atrial fibrillation Brief A/P: aPTT supratherapeutic Decrease Heparin rate  Allergies  Allergen Reactions   Bempedoic Acid Other (See Comments)    NEXLETOL Myalgias, GI upset   [name] [Alirocumab] Other (See Comments)    Muscle weakness and rash   Rosuvastatin Other (See Comments)    Low energy/leg and hip pain   Zetia [Ezetimibe] Other (See Comments)    myalgias   Hydralazine Other (See Comments)    Muscle Aches   Keflex [Cephalexin] Other (See Comments)    Reaction unk    Patient Measurements: Height: 6\' 2"  (188 cm) Weight: 99.8 kg (220 lb 0.3 oz) IBW/kg (Calculated) : 82.2 Heparin Dosing Weight: 98 kg  Vital Signs: Temp: 98.6 F (37 C) (06/18 2246) Temp Source: Oral (06/18 2246) BP: 132/64 (06/18 2246) Pulse Rate: 84 (06/18 2246)  Labs: Recent Labs    01/03/23 0058 01/03/23 2322 01/04/23 0842 01/04/23 1932 01/05/23 0018  HGB 13.6  --  13.6  --  13.2  HCT 39.4  --  39.4  --  37.7*  PLT 253  --  248  --  239  APTT  --    < > 55* 104* 145*  HEPARINUNFRC  --   --  >1.10*  --  >1.10*  CREATININE 1.49*  --  1.34*  --  1.14   < > = values in this interval not displayed.     Estimated Creatinine Clearance: 60.9 mL/min (by C-G formula based on SCr of 1.14 mg/dL).  Assessment: 82 y.o. male with h/o Afib, Eliquis on hold for heparin  Goal of Therapy:  Heparin level 0.3-0.7 units/ml Monitor platelets by anticoagulation protocol: Yes   Plan:  Decrease Heparin 1500 units/hr Check aPTT in 8 hours   Geannie Risen, PharmD, BCPS

## 2023-01-05 NOTE — Progress Notes (Signed)
[facility] Progress Note Patient: Joseph [name] ZOX:[id number] DOB: 04-07-1941 DOA: 01/02/2023  DOS: the patient was seen and examined on 01/05/2023  Brief hospital course: PMH of CAD, CKD 3A, CVA, type II DM, PAF, HLD, HTN presented to the hospital with complaints of abdominal pain. Found to have SBO. Currently being treated conservatively.  Initially had NG tube for decompression and n.p.o. status.  NG removed on 6/18. Assessment and Plan: Small bowel obstruction. Appreciate surgical consultation. Initially had NG tube with low intermittent suctioning. Treated with IV fluids. NG removed on 6/18.  Multiple bowel movements.  Diet advanced. Reports further distention of the abdomen on 6/19.  Also reports worsening abdominal pain.  Reports multiple episodes of diarrhea last night. X-ray abdomen on 6/19 shows improvement in obstruction. Simethicone added.  Paroxysmal A-fib. Rate controlled. CHA2DS2-VASc score 5.  Started on heparin. Will monitor. Now back on Eliquis.  HTN. Blood pressure stable. Currently on IV Lopressor as needed.  Type II DM, with vascular disease with stroke history, uncontrolled with hyperglycemia with long-acting insulin use. Hemoglobin A1c 7.5.  Currently on sliding scale insulin.  CKD 3A. Serum creatinine stable. Monitor report  History of CVA. Back on Eliquis.  HLD. Statin intolerant.  CAD. Currently no evidence of chest pain. oral antiplatelet medication on hold.   Subjective: Reports multiple BM last night with 1 BM per hour.  No blood in the stool.  Loose watery BM reported.  Reports abdominal pain and distention.  No nausea.  Physical Exam: In moderate distress. No rash. S1-S2 present. Clear to auscultation. Bowel sound present, distended, diffusely tender. No edema  Data Reviewed: I have Reviewed nursing notes, Vitals, and Lab results. Reviewed CBC and BMP.  Reordered CBC and BMP.  X-ray abdomen ordered.  Disposition: Status  is: Inpatient Remains inpatient appropriate because: Monitor for improvement in abdominal distention and pain and diet tolerance. SCDs Start: 01/02/23 1715 apixaban (ELIQUIS) tablet 5 mg   Family Communication: Family at bedside Level of care: Med-Surg   Vitals:   01/04/23 2246 01/05/23 0434 01/05/23 0816 01/05/23 1717  BP: 132/64 136/70 (!) 140/82 (!) 147/77  Pulse: 84 75 82 82  Resp: 19 19 17 18   Temp: 98.6 F (37 C) 98.2 F (36.8 C) 97.9 F (36.6 C) 97.9 F (36.6 C)  TempSrc: Oral Oral    SpO2: 97% 93% 96% 94%  Weight:      Height:         Author: Lynden Oxford, MD 01/05/2023 6:30 PM  Please look on [email or web address] to find out who is on call.

## 2023-01-05 NOTE — Plan of Care (Signed)
  Problem: Metabolic: Goal: Ability to maintain appropriate glucose levels will improve Outcome: Progressing   Problem: Nutritional: Goal: Maintenance of adequate nutrition will improve Outcome: Progressing   Problem: Skin Integrity: Goal: Risk for impaired skin integrity will decrease Outcome: Progressing   Problem: Tissue Perfusion: Goal: Adequacy of tissue perfusion will improve Outcome: Progressing   Problem: Clinical Measurements: Goal: Will remain free from infection Outcome: Progressing   Problem: Activity: Goal: Risk for activity intolerance will decrease Outcome: Progressing   Problem: Nutrition: Goal: Adequate nutrition will be maintained Outcome: Progressing

## 2023-01-05 NOTE — Progress Notes (Signed)
ANTICOAGULATION CONSULT NOTE  Pharmacy Consult for heparin >> apixaban Indication: atrial fibrillation  Allergies  Allergen Reactions   Bempedoic Acid Other (See Comments)    NEXLETOL Myalgias, GI upset   [name] [Alirocumab] Other (See Comments)    Muscle weakness and rash   Rosuvastatin Other (See Comments)    Low energy/leg and hip pain   Zetia [Ezetimibe] Other (See Comments)    myalgias   Hydralazine Other (See Comments)    Muscle Aches   Keflex [Cephalexin] Other (See Comments)    Reaction unk    Patient Measurements: Height: 6\' 2"  (188 cm) Weight: 99.8 kg (220 lb 0.3 oz) IBW/kg (Calculated) : 82.2 Heparin Dosing Weight: 98 kg  Vital Signs: Temp: 97.9 F (36.6 C) (06/19 0816) Temp Source: Oral (06/19 0434) BP: 140/82 (06/19 0816) Pulse Rate: 82 (06/19 0816)  Labs: Recent Labs    01/03/23 0058 01/03/23 2322 01/04/23 0842 01/04/23 1932 01/05/23 0018  HGB 13.6  --  13.6  --  13.2  HCT 39.4  --  39.4  --  37.7*  PLT 253  --  248  --  239  APTT  --    < > 55* 104* 145*  HEPARINUNFRC  --   --  >1.10*  --  >1.10*  CREATININE 1.49*  --  1.34*  --  1.14   < > = values in this interval not displayed.     Estimated Creatinine Clearance: 60.9 mL/min (by C-G formula based on SCr of 1.14 mg/dL).   Medical History: Past Medical History:  Diagnosis Date   Abdominal discomfort    Abdominal discomfort: EGD 2011 showed no significant abnormalities.  Possibly due to metformin.   Allergic rhinitis    Anticoagulated 05/30/2016   CAD (coronary artery disease)    a. s/p MI in 2004:  stents to the LAD and staged stent RCA, EF 55%;  b.  NSTEMI (1/16):  LHC - mid LAD stent with diff dist restenosis, prox to mid Dx mod dsz jailed by stent, small OM1 99, mCFX 75, dCFX 80, mRCA stent ok, mPDA mild to mod dsz, EF 55% >> PCI:  Synergy DES to LAD; Synergy DES (x2) mid and dist CFX   CAD in native artery 06/12/2019   Chronic kidney disease, stage 2, mildly decreased GFR     stage 2-3   DDD (degenerative disc disease)    L4 to S1 degenerative disease and spondylolisthesis: low back pain.  s/p back surgery   DM (diabetes mellitus) (HCC)    History of CVA (cerebrovascular accident)    Small CVA seen by head CT (incidental) in 1/13.  Carotid dopplers in 1/13 showed minimal disease   History of Doppler ultrasound    Arterial dopplers (3/11): no evidence for significant PAD. ABIs 10/12: Normal.     HLD (hyperlipidemia)    HTN (hypertension)    had side effects with chlorthalidone   Hx of cardiovascular stress test    Myoview (1/13): Small fixed apical septal defect with no ischemia, EF 58%.   Hx of echocardiogram    Echo (3/11): EF 60%, normal wall motion, mild MR, mild left atrial enlargement, mildly dilated ascending aorta.   PAF (paroxysmal atrial fibrillation) (HCC) 05/29/2016   Spondylolisthesis    L4-S1   Statin intolerance      Assessment: [name] on apixaban PTA for afib and now admitted with SBO. Last dose of apixaban 6/16. Pharmacy consulted to transition to heparin infusion - now to change back to  apixaban on 6/19 with bowel obstruction improved. CBC WNL. SCr trend down to 1.14. No bleeding issues reported.  Goal of Therapy:  Stroke prevention Monitor platelets by anticoagulation protocol: Yes   Plan:  Resume apixaban 5mg  PO BID at time of heparin infusion discontinuation - communicated plan with RN Monitor CBC, SCr, s/sx bleeding   Joseph [name], PharmD, BCPS Please check AMION for all [facility] contact numbers Clinical Pharmacist 01/05/2023 11:38 AM

## 2023-01-05 NOTE — Progress Notes (Signed)
Heparin rate changed to 1500 unit/hr. Denies pain, no bruises / blood noted. Eyes closed with even chest rises and respirations.

## 2023-01-05 NOTE — Progress Notes (Signed)
  Subjective No acute events. Feels better. Still having multiple Bms now. No n/v.  Objective: Vital signs in last 24 hours: Temp:  [97.5 F (36.4 C)-99.4 F (37.4 C)] 97.5 F (36.4 C) (06/18 0819) Pulse Rate:  [83-89] 83 (06/18 0819) Resp:  [16-18] 16 (06/18 0819) BP: (132-146)/(65-74) 146/65 (06/18 0819) SpO2:  [90 %-94 %] 94 % (06/18 0819) Last BM Date : 01/04/23  Intake/Output from previous day: 06/17 0701 - 06/18 0700 In: 2007.7 [I.V.:1827.7; NG/GT:180] Out: 300 [Emesis/NG output:300] Intake/Output this shift: No intake/output data recorded.  Gen: NAD, comfortable CV: RRR Pulm: Normal work of breathing Abd: Soft, nontender throughout; not significantly distended Ext: SCDs in place  Lab Results: CBC  Recent Labs    01/02/23 1337 01/03/23 0058  WBC 5.1 4.4  HGB 14.7 13.6  HCT 43.1 39.4  PLT 279 253   BMET Recent Labs    01/02/23 1337 01/03/23 0058  NA 136 132*  K 4.3 4.1  CL 100 100  CO2 21* 20*  GLUCOSE 170* 194*  BUN 28* 31*  CREATININE 1.47* 1.49*  CALCIUM 9.8 9.1   PT/INR No results for input(s): "LABPROT", "INR" in the last 72 hours. ABG No results for input(s): "PHART", "HCO3" in the last 72 hours.  Invalid input(s): "PCO2", "PO2"  Studies/Results:  Anti-infectives: Anti-infectives (From admission, onward)    None        Assessment/Plan: Patient Active Problem List   Diagnosis Date Noted   SBO (small bowel obstruction) (HCC) 01/03/2023   CKD (chronic kidney disease), stage III (HCC) 01/02/2023   Small bowel obstruction (HCC) 01/02/2023   CAD in native artery 06/12/2019   Unstable angina (HCC) 05/17/2019   Lower GI bleed 12/26/2017   Postural dizziness with presyncope 12/26/2017   Coronary artery disease of native artery of native heart with stable angina pectoris (HCC) 04/11/2017   Hyperlipidemia 04/11/2017   Anticoagulated 05/30/2016   Paroxysmal atrial fibrillation (HCC) 05/29/2016   Exertional angina (HCC) 08/02/2014    Bradycardia 02/02/2012   History of CVA (cerebrovascular accident) 08/01/2011   Leg pain 03/31/2011   Abdominal pain 10/13/2010   Hip pain 10/13/2010   Essential hypertension 09/09/2009   Peripheral vascular disease (HCC) 09/09/2009   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 We will remain available if questions/concerns arise - please let us know.  - Soft diet as tolerated - Ppx: SCDs; PO anticoagulation  per primary   LOS: 1 day   I spent a total of 35 minutes in both face-to-face and non-face-to-face activities, excluding procedures performed, for this visit on the date of this encounter.  Marin Olp, MD [facility]

## 2023-01-06 DIAGNOSIS — K56609 Unspecified intestinal obstruction, unspecified as to partial versus complete obstruction: Secondary | ICD-10-CM | POA: Diagnosis not present

## 2023-01-06 LAB — BASIC METABOLIC PANEL
Anion gap: 9 (ref 5–15)
BUN: 16 mg/dL (ref 8–23)
CO2: 21 mmol/L — ABNORMAL LOW (ref 22–32)
Calcium: 8.3 mg/dL — ABNORMAL LOW (ref 8.9–10.3)
Chloride: 103 mmol/L (ref 98–111)
Creatinine, Ser: 1.15 mg/dL (ref 0.61–1.24)
GFR, Estimated: >60 mL/min (ref >60)
Glucose, Bld: 159 mg/dL — ABNORMAL HIGH (ref 70–99)
Potassium: 3.4 mmol/L — ABNORMAL LOW (ref 3.5–5.1)
Sodium: 133 mmol/L — ABNORMAL LOW (ref 135–145)

## 2023-01-06 LAB — CBC
HCT: 36.2 % — ABNORMAL LOW (ref 39.0–52.0)
Hemoglobin: 12.7 g/dL — ABNORMAL LOW (ref 13.0–17.0)
MCH: 31.2 pg (ref 26.0–34.0)
MCHC: 35.1 g/dL (ref 30.0–36.0)
MCV: 88.9 fL (ref 80.0–100.0)
Platelets: 254 10*3/uL (ref 150–400)
RBC: 4.07 MIL/uL — ABNORMAL LOW (ref 4.22–5.81)
RDW: 12.4 % (ref 11.5–15.5)
WBC: 11.6 10*3/uL — ABNORMAL HIGH (ref 4.0–10.5)
nRBC: 0 % (ref 0.0–0.2)

## 2023-01-06 LAB — GLUCOSE, CAPILLARY
Glucose-Capillary: 137 mg/dL — ABNORMAL HIGH (ref 70–99)
Glucose-Capillary: 196 mg/dL — ABNORMAL HIGH (ref 70–99)
Glucose-Capillary: 215 mg/dL — ABNORMAL HIGH (ref 70–99)

## 2023-01-06 LAB — MAGNESIUM: Magnesium: 1.7 mg/dL (ref 1.7–2.4)

## 2023-01-06 MED ORDER — TRESIBA FLEXTOUCH 100 UNIT/ML ~~LOC~~ SOPN: 10.0000 [IU] | PEN_INJECTOR | Freq: Every day | SUBCUTANEOUS | Status: DC

## 2023-01-06 MED ORDER — ACETAMINOPHEN ER 650 MG PO TBCR: 650.0000 mg | EXTENDED_RELEASE_TABLET | Freq: Three times a day (TID) | ORAL | Status: AC | PRN

## 2023-01-06 MED ORDER — SIMETHICONE 80 MG PO CHEW: 80.0000 mg | CHEWABLE_TABLET | Freq: Four times a day (QID) | ORAL | 0 refills | Status: DC

## 2023-01-06 MED ORDER — PANTOPRAZOLE SODIUM 40 MG PO TBEC: 40.0000 mg | DELAYED_RELEASE_TABLET | Freq: Every day | ORAL | 0 refills | Status: DC

## 2023-01-06 MED ORDER — POLYETHYLENE GLYCOL 3350 17 G PO PACK: 17.0000 g | PACK | Freq: Every day | ORAL | 0 refills | Status: AC

## 2023-01-06 MED ORDER — ONDANSETRON HCL 4 MG PO TABS: 4.0000 mg | ORAL_TABLET | Freq: Three times a day (TID) | ORAL | 1 refills | Status: AC | PRN

## 2023-01-06 MED ORDER — SACCHAROMYCES BOULARDII 250 MG PO CAPS: 250.0000 mg | ORAL_CAPSULE | Freq: Two times a day (BID) | ORAL | 0 refills | Status: AC

## 2023-01-06 MED ORDER — TELMISARTAN 20 MG PO TABS
20.0000 mg | ORAL_TABLET | Freq: Every day | ORAL | 0 refills | Status: AC
Start: 2023-01-06 — End: ?

## 2023-01-06 NOTE — Plan of Care (Signed)
  Problem: Coping: Goal: Ability to adjust to condition or change in health will improve Outcome: Progressing   Problem: Health Behavior/Discharge Planning: Goal: Ability to manage health-related needs will improve Outcome: Progressing   Problem: Metabolic: Goal: Ability to maintain appropriate glucose levels will improve Outcome: Progressing   Problem: Nutritional: Goal: Maintenance of adequate nutrition will improve Outcome: Progressing   Problem: Tissue Perfusion: Goal: Adequacy of tissue perfusion will improve Outcome: Progressing   Problem: Education: Goal: Knowledge of General Education information will improve Description: Including pain rating scale, medication(s)/side effects and non-pharmacologic comfort measures Outcome: Progressing

## 2023-01-06 NOTE — Progress Notes (Signed)
Patient discharged home, self care. PIV removed. AVS discussed, pt verbalized understanding. Daughter called to speak with provider prior to discharge. Medications to be picked up from [facility]. Patient transported via [facility] in NAD at the time of discharge.

## 2023-01-06 NOTE — Plan of Care (Signed)

## 2023-01-06 NOTE — Progress Notes (Signed)
Mobility Specialist Progress Note:    01/06/23 1443  Mobility  Activity Ambulated with assistance in hallway  Level of Assistance Standby assist, set-up cues, supervision of patient - no hands on  Assistive Device None  Distance Ambulated (ft) 500 ft  Activity Response Tolerated well  Mobility Referral Yes  $Mobility charge 1 Mobility  Mobility Specialist Start Time (ACUTE ONLY) 1419  Mobility Specialist Stop Time (ACUTE ONLY) 1427  Mobility Specialist Time Calculation (min) (ACUTE ONLY) 8 min   Received pt in bed having no complaints and agreeable to mobility. Pt was asymptomatic throughout ambulation and returned to room w/o fault. Left in bed w/ call bell in reach and all needs met.   Thompson Grayer Mobility Specialist  Please contact vis Secure Chat or  Rehab Office [phone]

## 2023-01-06 NOTE — Discharge Summary (Addendum)
Physician Discharge Summary   Patient: Joseph [name] MRN: [id number] DOB: 22-Aug-1940  Admit date:     01/02/2023  Discharge date: 01/06/23  Discharge Physician: Lynden Oxford  PCP: Erskine Emery, NP  Recommendations at discharge: Follow-up with PCP in 1 week.  Discuss blood pressure medication as well as diabetic regimen. Follow-up with GI in 2 months.   Follow-up Information     Erskine Emery, NP. Schedule an appointment as soon as possible for a visit in 1 week(s).   Contact information: [address] [place] [address] [phone]         Andria Meuse, MD Follow up.   Specialties: General Surgery, Colon and Rectal Surgery Why: As needed Contact information: [address] [place] [address] [place] [address] [phone]         [facility]. Schedule an appointment as soon as possible for a visit in 2 month(s).   Specialty: Gastroenterology Contact information: [address] [place] [address] [phone]               Discharge Diagnoses: Principal Problem:   Small bowel obstruction [facility]) Active Problems:   Paroxysmal atrial fibrillation (HCC)   Essential hypertension   Type 2 diabetes mellitus with vascular disease (HCC)   CKD (chronic kidney disease), stage III (HCC)   History of CVA (cerebrovascular accident)   Hyperlipidemia   CAD S/P percutaneous coronary angioplasty   SBO (small bowel obstruction) (HCC)  Brief hospital course: PMH of CAD, CKD 3A, CVA, type II DM, PAF, HLD, HTN presented to the hospital with complaints of abdominal pain. Found to have SBO. Currently being treated conservatively.  Initially had NG tube for decompression and n.p.o. status.  NG removed on 6/18. Assessment and Plan: Small bowel obstruction. Appreciate surgical consultation. Initially had NG tube with low intermittent suctioning. Treated with IV fluids. NG removed on 6/18.  Multiple bowel  movements.  Diet advanced. Reports further distention of the abdomen on 6/19.  Also reports worsening abdominal pain.  Reports multiple episodes of diarrhea last night. X-ray abdomen on 6/19 shows improvement in obstruction. Simethicone added.  Recommend MiraLAX going forward. Leukocytosis mildly worsening although abdomen appears to be soft, patient tolerated breakfast and lunch and dinner without any nausea or vomiting and no further diarrhea reported. Close follow-up with PCP.  Paroxysmal A-fib. Rate controlled. CHA2DS2-VASc score 5.  Started on heparin. Now back on Eliquis.  HTN. Blood pressure stable.  Without any medication. His medications were adjusted to accommodate the blood pressure. Recommended discontinue Norvasc going forward given risk for smooth muscle effect with constipation.  Type II DM, with vascular disease with stroke history, uncontrolled with hyperglycemia with long-acting insulin use. Hemoglobin A1c 7.5.  In the hospital was on sliding scale insulin. Will resume low-dose [place] on discharge.  Discontinue glipizide for now.  CKD 3A. Serum creatinine stable. Monitor report  History of CVA. Back on Eliquis.  HLD. Statin intolerant.  CAD. Currently no evidence of chest pain.  Consultants:  General surgery  Procedures performed:  NG tube insertion  DISCHARGE MEDICATION: Allergies as of 01/06/2023       Reactions   Bempedoic Acid Other (See Comments)   NEXLETOL Myalgias, GI upset   [name] [alirocumab] Other (See Comments)   Muscle weakness and rash   Rosuvastatin Other (See Comments)   Low energy/leg and hip pain   Zetia [ezetimibe] Other (See Comments)   myalgias   Hydralazine Other (See Comments)  Muscle Aches   Keflex [cephalexin] Other (See Comments)   Reaction unk        Medication List     STOP taking these medications    amLODipine 10 MG tablet Commonly known as: NORVASC   glipiZIDE 10 MG tablet Commonly known as:  GLUCOTROL       TAKE these medications    acetaminophen 650 MG CR tablet Commonly known as: TYLENOL Take 1 tablet (650 mg total) by mouth every 8 (eight) hours as needed for pain. What changed:  how much to take when to take this reasons to take this   apixaban 5 MG Tabs tablet Commonly known as: Eliquis Take 1 tablet (5 mg total) by mouth 2 (two) times daily.   carvedilol 6.25 MG tablet Commonly known as: COREG TAKE 1 TABLET BY MOUTH TWICE A DAY   clopidogrel 75 MG tablet Commonly known as: PLAVIX TAKE 1 TABLET BY MOUTH EVERY DAY   diltiazem 30 MG tablet Commonly known as: Cardizem Take 1 tablet (30 mg total) by mouth as needed (Rapid Palpitations).   fish oil-omega-3 fatty acids 1000 MG capsule Take 2 g by mouth at bedtime.   isosorbide mononitrate 30 MG 24 hr tablet Commonly known as: IMDUR Take 30 mg by mouth every evening. What changed: Another medication with the same name was removed. Continue taking this medication, and follow the directions you see here.   nitroGLYCERIN 0.4 MG SL tablet Commonly known as: NITROSTAT PLACE 1 TAB UNDER THE TONGUE EVERY 5 MINS AS NEEDED FOR CHEST PAIN FOR UP TO 3 DOSES   ondansetron 4 MG tablet Commonly known as: Zofran Take 1 tablet (4 mg total) by mouth every 8 (eight) hours as needed for nausea or vomiting.   oxymetazoline 0.05 % nasal spray Commonly known as: AFRIN Place 2 sprays into both nostrils at bedtime.   pantoprazole 40 MG tablet Commonly known as: PROTONIX Take 1 tablet (40 mg total) by mouth daily.   polyethylene glycol 17 g packet Commonly known as: MiraLax Take 17 g by mouth daily.   saccharomyces boulardii 250 MG capsule Commonly known as: FLORASTOR Take 1 capsule (250 mg total) by mouth 2 (two) times daily for 7 days.   simethicone 80 MG chewable tablet Commonly known as: MYLICON Chew 1 tablet (80 mg total) by mouth 4 (four) times daily.   telmisartan 20 MG tablet Commonly known as:  MICARDIS Take 1 tablet (20 mg total) by mouth daily. What changed:  medication strength how much to take   Tresiba FlexTouch 100 UNIT/ML FlexTouch Pen Generic drug: insulin degludec Inject 10 Units into the skin daily. What changed: how much to take   Vitamin D3 25 MCG (1000 UT) Caps Take 1,000 Units by mouth daily.   ZINC ACETATE PO Take 1 tablet by mouth daily.       Disposition: Home Diet recommendation: Cardiac diet  Discharge Exam: Vitals:   01/06/23 0500 01/06/23 0810 01/06/23 1501 01/06/23 1604  BP: 116/81 133/82 137/75 (!) 142/83  Pulse: 72 86 85 79  Resp:  18 18 18   Temp: 98.3 F (36.8 C) 97.7 F (36.5 C) 98.2 F (36.8 C) 97.6 F (36.4 C)  TempSrc: Oral Oral Oral Oral  SpO2: 93% 96% 94% 96%  Weight:      Height:       General: Appear in mild distress; no visible Abnormal Neck Mass Or lumps, Conjunctiva normal Cardiovascular: S1 and S2 Present, no Murmur, Respiratory: good respiratory effort, Bilateral Air entry  present and CTA, no Crackles, no wheezes Abdomen: Bowel Sound present, noo Extremities: no Pedal edema Neurology: alert and oriented to time, place, and person  Seen in the hallway ambulating. Filed Weights   01/02/23 1326 01/02/23 1956  Weight: 97.1 kg 99.8 kg   Condition at discharge: stable  The results of significant diagnostics from this hospitalization (including imaging, microbiology, ancillary and laboratory) are listed below for reference.   Imaging Studies: DG Abd 1 View  Result Date: 01/05/2023 CLINICAL DATA:  Abdominal distention and diarrhea EXAM: ABDOMEN - 1 VIEW COMPARISON:  01/04/2023 FINDINGS: Further clearance of the contrast medium from the colon. A loop of small bowel to the left of midline measures up to 3.8 cm in diameter, mildly dilated. Previous loops of small bowel were to about 4.8 cm on yesterday's exam. The appearance suggests improvement. Substantial lower thoracic and lumbar spondylosis along with degenerative disc  disease. Posterolateral rod and pedicle screw fixation at L4-L5-S1 with posterior decompression. 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 Mild spurring of both femoral heads. Atherosclerosis is present, including aortoiliac atherosclerotic disease. IMPRESSION: 1. Further clearance of the contrast medium from the colon. 2. Mildly dilated loop of small bowel to the left of midline, but with improvement from yesterday's exam. 3.  Aortic Atherosclerosis (ICD10-I70.0). 4. Substantial lower thoracic and lumbar spondylosis and degenerative disc disease. 5. Posterolateral rod and pedicle screw fixation at L4-L5-S1 with posterior decompression. Electronically Signed   By: Gaylyn Rong M.D.   On: 01/05/2023 16:23   DG Abd Portable 1V  Result Date: 01/04/2023 CLINICAL DATA:  Small-bowel obstruction. EXAM: PORTABLE ABDOMEN - 1 VIEW COMPARISON:  KUB 1 day prior, CT abdomen/pelvis 2 days prior FINDINGS: The enteric catheter tip and sidehole are in the stomach. Enteric contrast has progressed antegrade into the colon and rectum. There remains gaseous distention of loops of small bowel though this may be slightly improved compared to the study from 1 day prior. There is residual contrast in the stomach. There is no definite free intraperitoneal air, within the confines of supine technique. The imaged lung bases are clear. There is no acute osseous abnormality. IMPRESSION: Enteric contrast is passed antegrade into the colon and rectum. Gaseous distention of the small bowel may be slightly improved compared to the study from 1 day prior. Electronically Signed   By: Lesia Hausen M.D.   On: 01/04/2023 10:55   DG Abd Portable 1V-Small Bowel Obstruction Protocol-initial, 8 hr delay  Result Date: 01/03/2023 CLINICAL DATA:  9 hour small-bowel follow EXAM: PORTABLE ABDOMEN - 1 VIEW COMPARISON:  Film from the previous day. FINDINGS: Gastric catheter is noted within the stomach. Contrast material is seen  within the stomach. Multiple dilated loops of small bowel are seen. Contrast is noted from prior CT in the bladder. No free air is noted. Postsurgical changes are noted. IMPRESSION: Persistent small bowel dilatation. Administered contrast lies within the stomach. Electronically Signed   By: Alcide Clever M.D.   On: 01/03/2023 03:53   DG Abd Portable 1V-Small Bowel Protocol-Position Verification  Result Date: 01/02/2023 CLINICAL DATA:  Confirm nasogastric tube EXAM: PORTABLE ABDOMEN - 1 VIEW COMPARISON:  CT abdomen and pelvis 01/02/2023 FINDINGS: Nasogastric tube tip is in the gastric fundus. Dilated small bowel loops are again seen compatible with small-bowel obstruction. IMPRESSION: Nasogastric tube tip is in the gastric fundus. Electronically Signed   By: Darliss Cheney M.D.   On: 01/02/2023 18:50   CT ABDOMEN PELVIS W CONTRAST  Result Date: 01/02/2023  CLINICAL DATA:  Acute non localized abdominal pain. EXAM: CT ABDOMEN AND PELVIS WITH CONTRAST TECHNIQUE: Multidetector CT imaging of the abdomen and pelvis was performed using the standard protocol following bolus administration of intravenous contrast. RADIATION DOSE REDUCTION: This exam was performed according to the departmental dose-optimization program which includes automated exposure control, adjustment of the mA and/or kV according to patient size and/or use of iterative reconstruction technique. CONTRAST:  75mL OMNIPAQUE IOHEXOL 350 MG/ML SOLN COMPARISON:  03/16/2020 FINDINGS: Lower Chest: No acute findings. Hepatobiliary: No hepatic masses identified. Layering sludge or tiny gallstones noted, without evidence of cholecystitis or biliary ductal dilatation. Pancreas:  No mass or inflammatory changes. Spleen: Within normal limits in size and appearance. Adrenals/Urinary Tract: No suspicious masses identified. Multiple small less than 1 cm renal calculi seen bilaterally. No evidence of ureteral calculi or hydronephrosis. A 4 mm calculus is seen within  the urinary bladder. Stomach/Bowel: Moderately dilated small bowel loops air-fluid levels are seen, with transition point in the right lateral abdomen. No mass or inflammatory process is seen at this site, and this is suspicious for adhesion. Mild sigmoid diverticulosis is noted, however there is no evidence of diverticulitis. Small amount of free fluid in pelvis, however no evidence of abscess. Vascular/Lymphatic: No pathologically enlarged lymph nodes. No acute vascular findings. Aortic atherosclerotic calcification incidentally noted. Reproductive:  Prior prostatectomy. Other:  None. Musculoskeletal:  No suspicious bone lesions identified. IMPRESSION: Partial small bowel obstruction, with transition point in right lateral abdomen suspicious for adhesion. Small amount of free fluid in pelvis. No evidence of abscess. Bilateral nephrolithiasis and 4 mm bladder calculus. No evidence of ureteral calculi or hydronephrosis. Cholelithiasis versus gallbladder sludge. No radiographic evidence of cholecystitis. Mild sigmoid diverticulosis, without radiographic evidence of diverticulitis. Aortic Atherosclerosis (ICD10-I70.0). Electronically Signed   By: Danae Orleans M.D.   On: 01/02/2023 15:28   MYOCARDIAL PERFUSION IMAGING  Result Date: 12/30/2022   ECG is normal. ECG rhythm shows sinus bradycardia. The ECG shows premature atrial contractions.   A pharmacological stress test was performed using IV Lexiscan 0.4mg  over 10 seconds performed without concurrent submaximal exercise. The patient reported dyspnea during the stress test. Onset of symptoms occurred at stage 0.5 of the protocol. Symptoms began at minute 30 sec during stress and ended at minute 4 during recovery. Normal blood pressure and normal heart rate response noted during stress. Heart rate recovery was normal.   No ST deviation was noted. ECG was interpretable and without significant changes. The ECG was not diagnostic due to pharmacologic protocol.   LV  perfusion is abnormal. There is no evidence of ischemia. 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 Defect 1: There is a small defect with severe reduction in uptake present in the apical septal and apex location(s) that is fixed. There is normal wall motion in the defect area. Consistent with small area of infarction.   Left ventricular function is normal. Nuclear stress EF: 57 %. End diastolic cavity size is normal. End systolic cavity size is normal. No evidence of transient ischemic dilation (TID) noted.   Prior study available for comparison from 11/22/2018 with side to side comparison.  No change in perfusion compared to 2020.   Findings are consistent with no ischemia. The study is low risk.    Microbiology: Results for orders placed or performed during the hospital encounter of 10/26/20  Resp Panel by RT-PCR (Flu A&B, Covid) Nasopharyngeal Swab     Status: None   Collection Time: 10/26/20 10:17 PM   Specimen: Nasopharyngeal Swab;  Nasopharyngeal(NP) swabs in vial transport medium  Result Value Ref Range Status   SARS Coronavirus 2 by RT PCR NEGATIVE NEGATIVE Final    Comment: (NOTE) SARS-CoV-2 target nucleic acids are NOT DETECTED.  The SARS-CoV-2 RNA is generally detectable in upper respiratory specimens during the acute phase of infection. The lowest concentration of SARS-CoV-2 viral copies this assay can detect is 138 copies/mL. A negative result does not preclude SARS-Cov-2 infection and should not be used as the sole basis for treatment or other patient management decisions. A negative result may occur with  improper specimen collection/handling, submission of specimen other than nasopharyngeal swab, presence of viral mutation(s) within the areas targeted by this assay, and inadequate number of viral copies(<138 copies/mL). A negative result must be combined with clinical observations, patient history, and epidemiological information. The expected result is Negative.  Fact Sheet for  Patients:  BloggerCourse.com  Fact Sheet for Healthcare Providers:  SeriousBroker.it  This test is no t yet approved or cleared by the [place] FDA and  has been authorized for detection and/or diagnosis of SARS-CoV-2 by FDA under an Emergency Use Authorization (EUA). This EUA will remain  in effect (meaning this test can be used) for the duration of the COVID-19 declaration under Section 564(b)(1) of the Act, 21 U.S.C.section 360bbb-3(b)(1), unless the authorization is terminated  or revoked sooner.       Influenza A by PCR NEGATIVE NEGATIVE Final   Influenza B by PCR NEGATIVE NEGATIVE Final    Comment: (NOTE) The Xpert Xpress SARS-CoV-2/FLU/RSV plus assay is intended as an aid in the diagnosis of influenza from Nasopharyngeal swab specimens and should not be used as a sole basis for treatment. Nasal washings and aspirates are unacceptable for Xpert Xpress SARS-CoV-2/FLU/RSV testing.  Fact Sheet for Patients: BloggerCourse.com  Fact Sheet for Healthcare Providers: SeriousBroker.it  This test is not yet approved or cleared by the [place] FDA and has been authorized for detection and/or diagnosis of SARS-CoV-2 by FDA under an Emergency Use Authorization (EUA). This EUA will remain in effect (meaning this test can be used) for the duration of the COVID-19 declaration under Section 564(b)(1) of the Act, 21 U.S.C. section 360bbb-3(b)(1), unless the authorization is terminated or revoked.  Performed at [facility], [address], [place], [place] [address]    Labs: CBC: Recent Labs  Lab 01/02/23 1337 01/03/23 0058 01/04/23 0842 01/05/23 0018 01/06/23 0004  WBC 5.1 4.4 8.9 9.9 11.6*  NEUTROABS 3.1  --   --   --   --   HGB 14.7 13.6 13.6 13.2 12.7*  HCT 43.1 39.4 39.4 37.7* 36.2*  MCV 90.7 91.2 91.8 89.1 88.9  PLT 279 253 248 239 254   Basic  Metabolic Panel: Recent Labs  Lab 01/02/23 1337 01/02/23 2118 01/03/23 0058 01/04/23 0842 01/05/23 0018 01/06/23 0004  NA 136  --  132* 139 134* 133*  K 4.3  --  4.1 4.3 3.5 3.4*  CL 100  --  100 106 106 103  CO2 21*  --  20* 22 18* 21*  GLUCOSE 170*  --  194* 154* 160* 159*  BUN 28*  --  31* 30* 24* 16  CREATININE 1.47*  --  1.49* 1.34* 1.14 1.15  CALCIUM 9.8  --  9.1 8.6* 8.4* 8.3*  MG  --  1.9  --  1.8 1.7 1.7   Liver Function Tests: Recent Labs  Lab 01/02/23 1337 01/04/23 0842  AST 25 20  ALT 38 27  ALKPHOS 72 58  BILITOT 0.8 0.9  PROT 6.7 5.7*  ALBUMIN 4.0 3.1*   CBG: Recent Labs  Lab 01/05/23 1726 01/05/23 2009 01/06/23 0808 01/06/23 1240 01/06/23 1557  GLUCAP 119* 164* 137* 196* 215*    Discharge time spent: greater than 30 minutes.  Author: Lynden Oxford, MD  [facility]

## 2023-01-06 NOTE — Care Management Important Message (Signed)
Important Message  Patient Details  Name: Joseph [name] MRN: [id number] Date of Birth: [birth year]   Medicare Important Message Given:  Yes     Sherilyn Banker 01/06/2023, 2:38 PM

## 2023-01-13 ENCOUNTER — Other Ambulatory Visit: Payer: Self-pay

## 2023-01-13 ENCOUNTER — Encounter (HOSPITAL_COMMUNITY): Payer: Self-pay

## 2023-01-13 ENCOUNTER — Emergency Department (HOSPITAL_COMMUNITY): Payer: Medicare Other

## 2023-01-13 ENCOUNTER — Emergency Department (HOSPITAL_COMMUNITY)
Admission: EM | Admit: 2023-01-13 | Discharge: 2023-01-13 | Disposition: A | Discharge status: Home / Self Care | Payer: Medicare Other | Attending: Emergency Medicine | Admitting: Emergency Medicine

## 2023-01-13 DIAGNOSIS — Z8546 Personal history of malignant neoplasm of prostate: Secondary | ICD-10-CM | POA: Insufficient documentation

## 2023-01-13 DIAGNOSIS — Z7901 Long term (current) use of anticoagulants: Secondary | ICD-10-CM | POA: Insufficient documentation

## 2023-01-13 DIAGNOSIS — R112 Nausea with vomiting, unspecified: Secondary | ICD-10-CM

## 2023-01-13 DIAGNOSIS — I2510 Atherosclerotic heart disease of native coronary artery without angina pectoris: Secondary | ICD-10-CM | POA: Diagnosis not present

## 2023-01-13 DIAGNOSIS — I4891 Unspecified atrial fibrillation: Secondary | ICD-10-CM | POA: Diagnosis not present

## 2023-01-13 DIAGNOSIS — R1011 Right upper quadrant pain: Secondary | ICD-10-CM | POA: Insufficient documentation

## 2023-01-13 DIAGNOSIS — R1013 Epigastric pain: Secondary | ICD-10-CM | POA: Insufficient documentation

## 2023-01-13 DIAGNOSIS — E1122 Type 2 diabetes mellitus with diabetic chronic kidney disease: Secondary | ICD-10-CM | POA: Insufficient documentation

## 2023-01-13 DIAGNOSIS — Z79899 Other long term (current) drug therapy: Secondary | ICD-10-CM | POA: Insufficient documentation

## 2023-01-13 DIAGNOSIS — D72829 Elevated white blood cell count, unspecified: Secondary | ICD-10-CM | POA: Diagnosis not present

## 2023-01-13 DIAGNOSIS — N189 Chronic kidney disease, unspecified: Secondary | ICD-10-CM | POA: Diagnosis not present

## 2023-01-13 DIAGNOSIS — R778 Other specified abnormalities of plasma proteins: Secondary | ICD-10-CM | POA: Diagnosis not present

## 2023-01-13 DIAGNOSIS — R6883 Chills (without fever): Secondary | ICD-10-CM | POA: Insufficient documentation

## 2023-01-13 DIAGNOSIS — R1012 Left upper quadrant pain: Secondary | ICD-10-CM | POA: Diagnosis not present

## 2023-01-13 DIAGNOSIS — M793 Panniculitis, unspecified: Secondary | ICD-10-CM

## 2023-01-13 LAB — COMPREHENSIVE METABOLIC PANEL
ALT: 24 U/L (ref 0–44)
AST: 19 U/L (ref 15–41)
Albumin: 3.6 g/dL (ref 3.5–5.0)
Alkaline Phosphatase: 53 U/L (ref 38–126)
Anion gap: 10 (ref 5–15)
BUN: 23 mg/dL (ref 8–23)
CO2: 21 mmol/L — ABNORMAL LOW (ref 22–32)
Calcium: 9.3 mg/dL (ref 8.9–10.3)
Chloride: 102 mmol/L (ref 98–111)
Creatinine, Ser: 1.28 mg/dL — ABNORMAL HIGH (ref 0.61–1.24)
GFR, Estimated: 55 mL/min — ABNORMAL LOW (ref >60)
Glucose, Bld: 165 mg/dL — ABNORMAL HIGH (ref 70–99)
Potassium: 4.3 mmol/L (ref 3.5–5.1)
Sodium: 133 mmol/L — ABNORMAL LOW (ref 135–145)
Total Bilirubin: 1.2 mg/dL (ref 0.3–1.2)
Total Protein: 6 g/dL — ABNORMAL LOW (ref 6.5–8.1)

## 2023-01-13 LAB — CBC WITH DIFFERENTIAL/PLATELET
Abs Immature Granulocytes: 0.07 10*3/uL (ref 0.00–0.07)
Basophils Absolute: 0 10*3/uL (ref 0.0–0.1)
Basophils Relative: 0 %
Eosinophils Absolute: 0 10*3/uL (ref 0.0–0.5)
Eosinophils Relative: 0 %
HCT: 40.1 % (ref 39.0–52.0)
Hemoglobin: 13.8 g/dL (ref 13.0–17.0)
Immature Granulocytes: 1 %
Lymphocytes Relative: 1 %
Lymphs Abs: 0.1 10*3/uL — ABNORMAL LOW (ref 0.7–4.0)
MCH: 30.8 pg (ref 26.0–34.0)
MCHC: 34.4 g/dL (ref 30.0–36.0)
MCV: 89.5 fL (ref 80.0–100.0)
Monocytes Absolute: 0.3 10*3/uL (ref 0.1–1.0)
Monocytes Relative: 2 %
Neutro Abs: 14.8 10*3/uL — ABNORMAL HIGH (ref 1.7–7.7)
Neutrophils Relative %: 96 %
Platelets: 251 10*3/uL (ref 150–400)
RBC: 4.48 MIL/uL (ref 4.22–5.81)
RDW: 12.4 % (ref 11.5–15.5)
WBC: 15.4 10*3/uL — ABNORMAL HIGH (ref 4.0–10.5)
nRBC: 0 % (ref 0.0–0.2)

## 2023-01-13 LAB — TROPONIN I (HIGH SENSITIVITY)
Troponin I (High Sensitivity): 30 ng/L — ABNORMAL HIGH (ref <18)
Troponin I (High Sensitivity): 30 ng/L — ABNORMAL HIGH (ref <18)

## 2023-01-13 LAB — LIPASE, BLOOD: Lipase: 27 U/L (ref 11–51)

## 2023-01-13 MED ORDER — ONDANSETRON 8 MG PO TBDP: 8.0000 mg | ORAL_TABLET | Freq: Three times a day (TID) | ORAL | 0 refills | Status: DC | PRN

## 2023-01-13 MED ORDER — IOHEXOL 350 MG/ML SOLN
75.0000 mL | Freq: Once | INTRAVENOUS | Status: AC | PRN
  Administered 2023-01-13: 75 mL via INTRAVENOUS

## 2023-01-13 MED ORDER — ALUM & MAG HYDROXIDE-SIMETH 200-200-20 MG/5ML PO SUSP
30.0000 mL | Freq: Once | ORAL | Status: AC
  Administered 2023-01-13: 30 mL via ORAL
  Filled 2023-01-13: qty 30

## 2023-01-13 MED ORDER — NITROGLYCERIN 0.4 MG SL SUBL
0.4000 mg | SUBLINGUAL_TABLET | SUBLINGUAL | Status: DC | PRN
  Administered 2023-01-13: 0.4 mg via SUBLINGUAL
  Filled 2023-01-13: qty 1

## 2023-01-13 MED ORDER — FAMOTIDINE IN NACL 20-0.9 MG/50ML-% IV SOLN
20.0000 mg | Freq: Once | INTRAVENOUS | Status: AC
  Administered 2023-01-13: 20 mg via INTRAVENOUS
  Filled 2023-01-13: qty 50

## 2023-01-13 NOTE — ED Provider Notes (Signed)
[place] [facility]ERGENCY DEPARTMENT AT [facility] Provider Note   CSN: [id number] Arrival date & time: 01/13/23  1329     History  Chief Complaint  Patient presents with   Abdominal Pain    Joseph [name] is a 82 y.o. male.  Patient is an 82 year old male with a past medical history of CAD, A-fib on Eliquis, CKD, diabetes, prior prostate cancer status post prostatectomy presenting to the emergency department with abdominal pain.  Patient was recently admitted to the hospital about 2 weeks ago with a small bowel obstruction that was managed nonoperatively.  He states he was initially doing well until last night around 9 PM started to develop worsening abdominal pain.  He states that the pain feels like a pressure across his lower abdomen and in his lower chest.  He denies any shaded nausea or vomiting.  He states he did have 1 small bowel movement earlier today.  He denies any dysuria or hematuria.  The history is provided by the patient.  Abdominal Pain      Home Medications Prior to Admission medications   Medication Sig Start Date End Date Taking? Authorizing Provider  acetaminophen (TYLENOL) 650 MG CR tablet Take 1 tablet (650 mg total) by mouth every 8 (eight) hours as needed for pain. 01/06/23   Rolly Salter, MD  apixaban (ELIQUIS) 5 MG TABS tablet Take 1 tablet (5 mg total) by mouth 2 (two) times daily. 12/09/22   Nahser, Deloris Ping, MD  carvedilol (COREG) 6.25 MG tablet TAKE 1 TABLET BY MOUTH TWICE A DAY 06/29/22   Nahser, Deloris Ping, MD  Cholecalciferol (VITAMIN D3) 25 MCG (1000 UT) CAPS Take 1,000 Units by mouth daily.    [provider]  clopidogrel (PLAVIX) 75 MG tablet TAKE 1 TABLET BY MOUTH EVERY DAY 08/11/22   Nahser, Deloris Ping, MD  diltiazem (CARDIZEM) 30 MG tablet Take 1 tablet (30 mg total) by mouth as needed (Rapid Palpitations). 01/31/18   Tereso Newcomer T, PA-C  fish oil-omega-3 fatty acids 1000 MG capsule Take 2 g by mouth at bedtime.    [provider]  isosorbide mononitrate (IMDUR) 30 MG 24 hr tablet Take 30 mg by mouth every evening.    [provider]  nitroGLYCERIN (NITROSTAT) 0.4 MG SL tablet PLACE 1 TAB UNDER THE TONGUE EVERY 5 MINS AS NEEDED FOR CHEST PAIN FOR UP TO 3 DOSES 05/07/22   Nahser, Deloris Ping, MD  ondansetron (ZOFRAN) 4 MG tablet Take 1 tablet (4 mg total) by mouth every 8 (eight) hours as needed for nausea or vomiting. 01/06/23 01/06/24  Rolly Salter, MD  oxymetazoline (AFRIN) 0.05 % nasal spray Place 2 sprays into both nostrils at bedtime.    [provider]  pantoprazole (PROTONIX) 40 MG tablet Take 1 tablet (40 mg total) by mouth daily. 01/06/23   Rolly Salter, MD  polyethylene glycol (MIRALAX) 17 g packet Take 17 g by mouth daily. 01/06/23   Rolly Salter, MD  saccharomyces boulardii (FLORASTOR) 250 MG capsule Take 1 capsule (250 mg total) by mouth 2 (two) times daily for 7 days. 01/06/23 01/13/23  Rolly Salter, MD  simethicone (MYLICON) 80 MG chewable tablet Chew 1 tablet (80 mg total) by mouth 4 (four) times daily. 01/06/23   Rolly Salter, MD  telmisartan (MICARDIS) 20 MG tablet Take 1 tablet (20 mg total) by mouth daily. 01/06/23   Rolly Salter, MD  TRESIBA FLEXTOUCH 100 UNIT/ML FlexTouch Pen Inject 10  Units into the skin daily. 01/06/23   Rolly Salter, MD  Zinc Acetate, Oral, (ZINC ACETATE PO) Take 1 tablet by mouth daily.    [provider]      Allergies    Bempedoic acid, [name] [alirocumab], Rosuvastatin, Zetia [ezetimibe], Hydralazine, and Keflex [cephalexin]    Review of Systems   Review of Systems  Gastrointestinal:  Positive for abdominal pain.    Physical Exam Updated Vital Signs BP 124/69   Pulse 87   Temp 98.1 F (36.7 C) (Oral)   Resp (!) 31   Ht 6\' 2"  (1.88 m)   Wt 97.1 kg   SpO2 95%   BMI 27.48 kg/m  Physical Exam Vitals and nursing note reviewed.  Constitutional:      General: He is not in acute distress.    Appearance: He is  well-developed. He is obese.  HENT:     Head: Normocephalic and atraumatic.     Mouth/Throat:     Mouth: Mucous membranes are moist.     Pharynx: Oropharynx is clear.  Eyes:     Extraocular Movements: Extraocular movements intact.  Cardiovascular:     Rate and Rhythm: Normal rate and regular rhythm.     Heart sounds: Normal heart sounds.  Pulmonary:     Effort: Pulmonary effort is normal.     Breath sounds: Normal breath sounds.  Abdominal:     General: Abdomen is flat. Bowel sounds are normal.     Palpations: Abdomen is soft.     Tenderness: There is abdominal tenderness in the right upper quadrant, epigastric area and left upper quadrant. There is no guarding or rebound.  Skin:    General: Skin is warm and dry.  Neurological:     General: No focal deficit present.     Mental Status: He is alert and oriented to person, place, and time.  Psychiatric:        Mood and Affect: Mood normal.        Behavior: Behavior normal.     ED Results / Procedures / Treatments   Labs (all labs ordered are listed, but only abnormal results are displayed) Labs Reviewed  COMPREHENSIVE METABOLIC PANEL - Abnormal; Notable for the following components:      Result Value   Sodium 133 (*)    CO2 21 (*)    Glucose, Bld 165 (*)    Creatinine, Ser 1.28 (*)    Total Protein 6.0 (*)    GFR, Estimated 55 (*)    All other components within normal limits  CBC WITH DIFFERENTIAL/PLATELET - Abnormal; Notable for the following components:   WBC 15.4 (*)    Neutro Abs 14.8 (*)    Lymphs Abs 0.1 (*)    All other components within normal limits  TROPONIN I (HIGH SENSITIVITY) - Abnormal; Notable for the following components:   Troponin I (High Sensitivity) 30 (*)    All other components within normal limits  LIPASE, BLOOD  TROPONIN I (HIGH SENSITIVITY)    EKG EKG Interpretation Date/Time:  Thursday January 13 2023 13:59:23 EDT Ventricular Rate:  91 PR Interval:  230 QRS Duration:  106 QT  Interval:  344 QTC Calculation: 424 R Axis:   -57  Text Interpretation: Sinus rhythm Multiform ventricular premature complexes Prolonged PR interval Left anterior fascicular block Anteroseptal infarct, old No significant change since last tracing Confirmed by Elayne Snare ([id number]) on 01/13/2023 2:39:58 PM  Radiology US Abdomen Limited RUQ (LIVER/GB)  Result Date: 01/13/2023 CLINICAL DATA:  RUQ discomfort EXAM: ULTRASOUND ABDOMEN LIMITED RIGHT UPPER QUADRANT COMPARISON:  CT abdomen/pelvis June 16, 24. FINDINGS: Gallbladder: Possible gallstones. No thickening visualized. No sonographic Murphy sign noted by sonographer. Common bile duct: Diameter: 4.8 mm, within normal limits Liver: No focal lesion identified. Increased parenchymal echogenicity relative to the kidney. Portal vein is patent on color Doppler imaging with normal direction of blood flow towards the liver. Other: None. IMPRESSION: 1. Limited study with possible cholelithiasis. No evidence of acute cholecystitis. 2. Increased hepatic echogenicity which is nonspecific but can be seen with hepatic steatosis. Electronically Signed   By: Feliberto Harts M.D.   On: 01/13/2023 15:30    Procedures Procedures    Medications Ordered in ED Medications  famotidine (PEPCID) IVPB 20 mg premix (0 mg Intravenous Stopped 01/13/23 1510)  alum & mag hydroxide-simeth (MAALOX/MYLANTA) 200-200-20 MG/5ML suspension 30 mL (30 mLs Oral Given 01/13/23 1417)    ED Course/ Medical Decision Making/ A&P Clinical Course as of 01/13/23 1547  Thu Jan 13, 2023  1547 Patient signed out to Dr. Lynelle Doctor pending imaging and reassessment. [VK]    Clinical Course User Index [VK] Rexford Maus, DO                             Medical Decision Making This patient presents to the ED with chief complaint(s) of abdominal pain with pertinent past medical history of recent SBO, CAD, A fib on Eliquis, prostate cancer s/p prostatectomy which further complicates the  presenting complaint. The complaint involves an extensive differential diagnosis and also carries with it a high risk of complications and morbidity.    The differential diagnosis includes patient has normal bowel sounds without any vomiting making recurrent SBO less likely considering gastritis, GERD, pancreatitis, hepatitis, cholelithiasis, cholecystitis, atypical ACS, gastroenteritis, other intra-abdominal infection  Additional history obtained: Additional history obtained from N/A Records reviewed previous admission documents  ED Course and Reassessment: On patient's arrival he is hemodynamically stable in no acute distress.  Most of his tenderness is across his upper abdomen with normal bowel sounds.  The patient will have labs including troponin, LFTs and lipase, EKG, chest x-ray and right upper quadrant ultrasound and will be closely reassessed.  Independent labs interpretation:  The following labs were independently interpreted: Leukocytosis, mildly elevated troponin otherwise within normal range  Independent visualization of imaging: - I independently visualized the following imaging with scope of interpretation limited to determining acute life threatening conditions related to emergency care: Right upper quadrant ultrasound, which revealed possible cholelithiasis without cholecystitis     Amount and/or Complexity of Data Reviewed Labs: ordered. Radiology: ordered.  Risk OTC drugs. Prescription drug management.          Final Clinical Impression(s) / ED Diagnoses Final diagnoses:  None    Rx / DC Orders ED Discharge Orders     None         Rexford Maus, DO 01/13/23 1547

## 2023-01-13 NOTE — Discharge Instructions (Signed)
The antacid medications as prescribed.  Take the medications for nausea as needed.  Follow-up with your primary care doctor to be rechecked.  Return to the ED as needed for worsening symptoms

## 2023-01-13 NOTE — ED Provider Notes (Signed)
Clinical Course as of 01/13/23 2113  Thu Jan 13, 2023  1547 Patient signed out to Dr. Lynelle Doctor pending imaging and reassessment. [VK]  1712 CT scan does not show evidence of bowel obstruction.  Nonspecific upper mesentery haziness may represent panniculitis.  Mild diverticulosis noted but no other acute process [JK]  1907 Second opponent is stable at 30 [JK]  2112 Patient is feeling better now.  He has been able to eat drink water and have some crackers. [JK]    Clinical Course User Index [JK] Linwood Dibbles, MD [VK] Rexford Maus, DO   Patient was initially seen by Dr. Theresia Lo.  Please see her note.  Plan was to follow-up on the patient's serial troponins as well as his CT scan.  Patient's troponins are slightly elevated however they are stable.  Doubt that his symptoms are related to acute coronary syndrome.  She did have abdominal tenderness on exam when he arrived.  His CT scan shows findings consistent with panniculitis but there is no evidence of recurrent obstruction or other emergent etiology.  Patient symptoms have improved with treatment in the ED.  He is stable for discharge but is not sure how he will get home.  Will make sure patient is able to be safely transported home.   Linwood Dibbles, MD 01/13/23 2113

## 2023-01-13 NOTE — ED Notes (Signed)
Tolerated water and crackers.

## 2023-01-16 ENCOUNTER — Emergency Department (HOSPITAL_COMMUNITY): Payer: Medicare Other

## 2023-01-16 ENCOUNTER — Other Ambulatory Visit: Payer: Self-pay

## 2023-01-16 ENCOUNTER — Inpatient Hospital Stay (HOSPITAL_COMMUNITY)
Admission: EM | Admit: 2023-01-16 | Discharge: 2023-01-21 | DRG: 384 | Disposition: A | Discharge status: Skilled Nursing Facility | Payer: Medicare Other | Attending: Internal Medicine | Admitting: Internal Medicine

## 2023-01-16 ENCOUNTER — Encounter (HOSPITAL_COMMUNITY): Payer: Self-pay

## 2023-01-16 ENCOUNTER — Telehealth: Payer: Self-pay | Admitting: Physician Assistant

## 2023-01-16 DIAGNOSIS — K760 Fatty (change of) liver, not elsewhere classified: Secondary | ICD-10-CM | POA: Diagnosis present

## 2023-01-16 DIAGNOSIS — Z8249 Family history of ischemic heart disease and other diseases of the circulatory system: Secondary | ICD-10-CM

## 2023-01-16 DIAGNOSIS — K2980 Duodenitis without bleeding: Secondary | ICD-10-CM | POA: Diagnosis not present

## 2023-01-16 DIAGNOSIS — K828 Other specified diseases of gallbladder: Secondary | ICD-10-CM | POA: Diagnosis present

## 2023-01-16 DIAGNOSIS — R1033 Periumbilical pain: Secondary | ICD-10-CM | POA: Diagnosis not present

## 2023-01-16 DIAGNOSIS — M5137 Other intervertebral disc degeneration, lumbosacral region: Secondary | ICD-10-CM | POA: Diagnosis present

## 2023-01-16 DIAGNOSIS — I480 Paroxysmal atrial fibrillation: Secondary | ICD-10-CM | POA: Diagnosis present

## 2023-01-16 DIAGNOSIS — Z6827 Body mass index (BMI) 27.0-27.9, adult: Secondary | ICD-10-CM

## 2023-01-16 DIAGNOSIS — N210 Calculus in bladder: Secondary | ICD-10-CM | POA: Diagnosis present

## 2023-01-16 DIAGNOSIS — R1013 Epigastric pain: Secondary | ICD-10-CM

## 2023-01-16 DIAGNOSIS — R110 Nausea: Secondary | ICD-10-CM

## 2023-01-16 DIAGNOSIS — K269 Duodenal ulcer, unspecified as acute or chronic, without hemorrhage or perforation: Secondary | ICD-10-CM | POA: Diagnosis not present

## 2023-01-16 DIAGNOSIS — I7121 Aneurysm of the ascending aorta, without rupture: Secondary | ICD-10-CM | POA: Insufficient documentation

## 2023-01-16 DIAGNOSIS — R7989 Other specified abnormal findings of blood chemistry: Secondary | ICD-10-CM | POA: Insufficient documentation

## 2023-01-16 DIAGNOSIS — Z955 Presence of coronary angioplasty implant and graft: Secondary | ICD-10-CM

## 2023-01-16 DIAGNOSIS — I739 Peripheral vascular disease, unspecified: Secondary | ICD-10-CM | POA: Diagnosis present

## 2023-01-16 DIAGNOSIS — N179 Acute kidney failure, unspecified: Secondary | ICD-10-CM | POA: Diagnosis not present

## 2023-01-16 DIAGNOSIS — N200 Calculus of kidney: Secondary | ICD-10-CM | POA: Diagnosis present

## 2023-01-16 DIAGNOSIS — I10 Essential (primary) hypertension: Secondary | ICD-10-CM | POA: Diagnosis present

## 2023-01-16 DIAGNOSIS — Z79899 Other long term (current) drug therapy: Secondary | ICD-10-CM

## 2023-01-16 DIAGNOSIS — E1169 Type 2 diabetes mellitus with other specified complication: Secondary | ICD-10-CM

## 2023-01-16 DIAGNOSIS — R109 Unspecified abdominal pain: Secondary | ICD-10-CM | POA: Diagnosis present

## 2023-01-16 DIAGNOSIS — Z8719 Personal history of other diseases of the digestive system: Secondary | ICD-10-CM

## 2023-01-16 DIAGNOSIS — Z1152 Encounter for screening for COVID-19: Secondary | ICD-10-CM

## 2023-01-16 DIAGNOSIS — I2510 Atherosclerotic heart disease of native coronary artery without angina pectoris: Secondary | ICD-10-CM | POA: Diagnosis present

## 2023-01-16 DIAGNOSIS — Z881 Allergy status to other antibiotic agents status: Secondary | ICD-10-CM

## 2023-01-16 DIAGNOSIS — I252 Old myocardial infarction: Secondary | ICD-10-CM

## 2023-01-16 DIAGNOSIS — D132 Benign neoplasm of duodenum: Secondary | ICD-10-CM | POA: Diagnosis present

## 2023-01-16 DIAGNOSIS — G8929 Other chronic pain: Secondary | ICD-10-CM | POA: Diagnosis present

## 2023-01-16 DIAGNOSIS — E860 Dehydration: Secondary | ICD-10-CM | POA: Diagnosis present

## 2023-01-16 DIAGNOSIS — Z888 Allergy status to other drugs, medicaments and biological substances status: Secondary | ICD-10-CM

## 2023-01-16 DIAGNOSIS — E871 Hypo-osmolality and hyponatremia: Secondary | ICD-10-CM | POA: Diagnosis not present

## 2023-01-16 DIAGNOSIS — N183 Chronic kidney disease, stage 3 unspecified: Secondary | ICD-10-CM | POA: Diagnosis present

## 2023-01-16 DIAGNOSIS — N1831 Chronic kidney disease, stage 3a: Secondary | ICD-10-CM | POA: Diagnosis present

## 2023-01-16 DIAGNOSIS — Q219 Congenital malformation of cardiac septum, unspecified: Secondary | ICD-10-CM

## 2023-01-16 DIAGNOSIS — Z981 Arthrodesis status: Secondary | ICD-10-CM

## 2023-01-16 DIAGNOSIS — E1159 Type 2 diabetes mellitus with other circulatory complications: Secondary | ICD-10-CM | POA: Diagnosis present

## 2023-01-16 DIAGNOSIS — E1122 Type 2 diabetes mellitus with diabetic chronic kidney disease: Secondary | ICD-10-CM | POA: Diagnosis present

## 2023-01-16 DIAGNOSIS — R7401 Elevation of levels of liver transaminase levels: Secondary | ICD-10-CM | POA: Diagnosis present

## 2023-01-16 DIAGNOSIS — Z7902 Long term (current) use of antithrombotics/antiplatelets: Secondary | ICD-10-CM

## 2023-01-16 DIAGNOSIS — Z8673 Personal history of transient ischemic attack (TIA), and cerebral infarction without residual deficits: Secondary | ICD-10-CM

## 2023-01-16 DIAGNOSIS — Z7901 Long term (current) use of anticoagulants: Secondary | ICD-10-CM

## 2023-01-16 DIAGNOSIS — K449 Diaphragmatic hernia without obstruction or gangrene: Secondary | ICD-10-CM | POA: Diagnosis present

## 2023-01-16 DIAGNOSIS — E785 Hyperlipidemia, unspecified: Secondary | ICD-10-CM | POA: Diagnosis present

## 2023-01-16 DIAGNOSIS — K8020 Calculus of gallbladder without cholecystitis without obstruction: Secondary | ICD-10-CM | POA: Diagnosis present

## 2023-01-16 DIAGNOSIS — K317 Polyp of stomach and duodenum: Secondary | ICD-10-CM | POA: Diagnosis present

## 2023-01-16 DIAGNOSIS — Z9861 Coronary angioplasty status: Secondary | ICD-10-CM

## 2023-01-16 DIAGNOSIS — E663 Overweight: Secondary | ICD-10-CM | POA: Insufficient documentation

## 2023-01-16 DIAGNOSIS — Z794 Long term (current) use of insulin: Secondary | ICD-10-CM

## 2023-01-16 DIAGNOSIS — L089 Local infection of the skin and subcutaneous tissue, unspecified: Secondary | ICD-10-CM | POA: Diagnosis present

## 2023-01-16 DIAGNOSIS — E1151 Type 2 diabetes mellitus with diabetic peripheral angiopathy without gangrene: Secondary | ICD-10-CM | POA: Diagnosis present

## 2023-01-16 DIAGNOSIS — E1165 Type 2 diabetes mellitus with hyperglycemia: Secondary | ICD-10-CM | POA: Diagnosis present

## 2023-01-16 DIAGNOSIS — I129 Hypertensive chronic kidney disease with stage 1 through stage 4 chronic kidney disease, or unspecified chronic kidney disease: Secondary | ICD-10-CM | POA: Diagnosis present

## 2023-01-16 LAB — RAPID URINE DRUG SCREEN, HOSP PERFORMED
Amphetamines: NOT DETECTED
Barbiturates: NOT DETECTED
Benzodiazepines: NOT DETECTED
Cocaine: NOT DETECTED
Opiates: NOT DETECTED
Tetrahydrocannabinol: NOT DETECTED

## 2023-01-16 LAB — CBC WITH DIFFERENTIAL/PLATELET
Abs Immature Granulocytes: 0.03 10*3/uL (ref 0.00–0.07)
Basophils Absolute: 0 10*3/uL (ref 0.0–0.1)
Basophils Relative: 1 %
Eosinophils Absolute: 0.1 10*3/uL (ref 0.0–0.5)
Eosinophils Relative: 2 %
HCT: 38 % — ABNORMAL LOW (ref 39.0–52.0)
Hemoglobin: 13.1 g/dL (ref 13.0–17.0)
Immature Granulocytes: 1 %
Lymphocytes Relative: 4 %
Lymphs Abs: 0.3 10*3/uL — ABNORMAL LOW (ref 0.7–4.0)
MCH: 31 pg (ref 26.0–34.0)
MCHC: 34.5 g/dL (ref 30.0–36.0)
MCV: 89.8 fL (ref 80.0–100.0)
Monocytes Absolute: 0.3 10*3/uL (ref 0.1–1.0)
Monocytes Relative: 5 %
Neutro Abs: 5.5 10*3/uL (ref 1.7–7.7)
Neutrophils Relative %: 87 %
Platelets: 209 10*3/uL (ref 150–400)
RBC: 4.23 MIL/uL (ref 4.22–5.81)
RDW: 12.5 % (ref 11.5–15.5)
WBC: 6.2 10*3/uL (ref 4.0–10.5)
nRBC: 0 % (ref 0.0–0.2)

## 2023-01-16 LAB — COMPREHENSIVE METABOLIC PANEL
ALT: 88 U/L — ABNORMAL HIGH (ref 0–44)
AST: 88 U/L — ABNORMAL HIGH (ref 15–41)
Albumin: 3.1 g/dL — ABNORMAL LOW (ref 3.5–5.0)
Alkaline Phosphatase: 121 U/L (ref 38–126)
Anion gap: 11 (ref 5–15)
BUN: 39 mg/dL — ABNORMAL HIGH (ref 8–23)
CO2: 21 mmol/L — ABNORMAL LOW (ref 22–32)
Calcium: 8.5 mg/dL — ABNORMAL LOW (ref 8.9–10.3)
Chloride: 96 mmol/L — ABNORMAL LOW (ref 98–111)
Creatinine, Ser: 1.92 mg/dL — ABNORMAL HIGH (ref 0.61–1.24)
GFR, Estimated: 34 mL/min — ABNORMAL LOW (ref >60)
Glucose, Bld: 158 mg/dL — ABNORMAL HIGH (ref 70–99)
Potassium: 4.1 mmol/L (ref 3.5–5.1)
Sodium: 128 mmol/L — ABNORMAL LOW (ref 135–145)
Total Bilirubin: 1.1 mg/dL (ref 0.3–1.2)
Total Protein: 5.7 g/dL — ABNORMAL LOW (ref 6.5–8.1)

## 2023-01-16 LAB — URINALYSIS, ROUTINE W REFLEX MICROSCOPIC
Bacteria, UA: NONE SEEN
Bilirubin Urine: NEGATIVE
Glucose, UA: 50 mg/dL — AB
Ketones, ur: 20 mg/dL — AB
Nitrite: NEGATIVE
Protein, ur: 30 mg/dL — AB
Specific Gravity, Urine: 1.043 — ABNORMAL HIGH (ref 1.005–1.030)
pH: 5 (ref 5.0–8.0)

## 2023-01-16 LAB — D-DIMER, QUANTITATIVE: D-Dimer, Quant: 1.47 ug/mL-FEU — ABNORMAL HIGH (ref 0.00–0.50)

## 2023-01-16 LAB — SARS CORONAVIRUS 2 BY RT PCR: SARS Coronavirus 2 by RT PCR: NEGATIVE

## 2023-01-16 LAB — TROPONIN I (HIGH SENSITIVITY)
Troponin I (High Sensitivity): 29 ng/L — ABNORMAL HIGH (ref <18)
Troponin I (High Sensitivity): 24 ng/L — ABNORMAL HIGH (ref <18)

## 2023-01-16 LAB — LIPASE, BLOOD: Lipase: 40 U/L (ref 11–51)

## 2023-01-16 LAB — BRAIN NATRIURETIC PEPTIDE: B Natriuretic Peptide: 314.1 pg/mL — ABNORMAL HIGH (ref 0.0–100.0)

## 2023-01-16 LAB — LACTIC ACID, PLASMA
Lactic Acid, Venous: 1.6 mmol/L (ref 0.5–1.9)
Lactic Acid, Venous: 2.4 mmol/L (ref 0.5–1.9)
Lactic Acid, Venous: 1.2 mmol/L (ref 0.5–1.9)

## 2023-01-16 LAB — MAGNESIUM: Magnesium: 2.1 mg/dL (ref 1.7–2.4)

## 2023-01-16 LAB — GLUCOSE, CAPILLARY
Glucose-Capillary: 167 mg/dL — ABNORMAL HIGH (ref 70–99)
Glucose-Capillary: 155 mg/dL — ABNORMAL HIGH (ref 70–99)

## 2023-01-16 MED ORDER — FENTANYL CITRATE PF 50 MCG/ML IJ SOSY
50.0000 ug | PREFILLED_SYRINGE | Freq: Once | INTRAMUSCULAR | Status: AC
  Administered 2023-01-16: 50 ug via INTRAVENOUS
  Filled 2023-01-16: qty 1

## 2023-01-16 MED ORDER — ONDANSETRON HCL 4 MG PO TABS: 4.0000 mg | ORAL_TABLET | Freq: Four times a day (QID) | ORAL | Status: DC | PRN

## 2023-01-16 MED ORDER — NITROGLYCERIN 0.4 MG SL SUBL: 0.4000 mg | SUBLINGUAL_TABLET | SUBLINGUAL | Status: DC | PRN

## 2023-01-16 MED ORDER — APIXABAN 5 MG PO TABS
5.0000 mg | ORAL_TABLET | Freq: Two times a day (BID) | ORAL | Status: DC
  Administered 2023-01-16 – 2023-01-21 (×11): 5 mg via ORAL
  Filled 2023-01-16 (×11): qty 1

## 2023-01-16 MED ORDER — IOHEXOL 350 MG/ML SOLN: 75.0000 mL | Freq: Once | INTRAVENOUS | Status: DC | PRN

## 2023-01-16 MED ORDER — ACETAMINOPHEN 325 MG PO TABS
650.0000 mg | ORAL_TABLET | Freq: Four times a day (QID) | ORAL | Status: DC | PRN
  Administered 2023-01-20 – 2023-01-21 (×3): 650 mg via ORAL
  Filled 2023-01-16 (×3): qty 2

## 2023-01-16 MED ORDER — CARVEDILOL 6.25 MG PO TABS
6.2500 mg | ORAL_TABLET | Freq: Two times a day (BID) | ORAL | Status: DC
  Administered 2023-01-16 – 2023-01-21 (×9): 6.25 mg via ORAL
  Filled 2023-01-16 (×10): qty 1

## 2023-01-16 MED ORDER — ALUM & MAG HYDROXIDE-SIMETH 200-200-20 MG/5ML PO SUSP
30.0000 mL | Freq: Once | ORAL | Status: AC
  Administered 2023-01-16: 30 mL via ORAL
  Filled 2023-01-16: qty 30

## 2023-01-16 MED ORDER — ACETAMINOPHEN 650 MG RE SUPP: 650.0000 mg | Freq: Four times a day (QID) | RECTAL | Status: DC | PRN

## 2023-01-16 MED ORDER — ONDANSETRON HCL 4 MG/2ML IJ SOLN: 4.0000 mg | Freq: Four times a day (QID) | INTRAMUSCULAR | Status: DC | PRN

## 2023-01-16 MED ORDER — SODIUM CHLORIDE 0.9 % IV BOLUS
1000.0000 mL | Freq: Once | INTRAVENOUS | Status: AC
  Administered 2023-01-16: 1000 mL via INTRAVENOUS

## 2023-01-16 MED ORDER — FAMOTIDINE IN NACL 20-0.9 MG/50ML-% IV SOLN
20.0000 mg | Freq: Once | INTRAVENOUS | Status: AC
  Administered 2023-01-16: 20 mg via INTRAVENOUS
  Filled 2023-01-16: qty 50

## 2023-01-16 MED ORDER — POLYETHYLENE GLYCOL 3350 17 G PO PACK: 17.0000 g | PACK | Freq: Every day | ORAL | Status: DC | PRN

## 2023-01-16 MED ORDER — ONDANSETRON HCL 4 MG/2ML IJ SOLN
4.0000 mg | Freq: Once | INTRAMUSCULAR | Status: AC
  Administered 2023-01-16: 4 mg via INTRAVENOUS
  Filled 2023-01-16: qty 2

## 2023-01-16 MED ORDER — SODIUM CHLORIDE 0.9 % IV SOLN: INTRAVENOUS | Status: AC

## 2023-01-16 MED ORDER — IOHEXOL 350 MG/ML SOLN
60.0000 mL | Freq: Once | INTRAVENOUS | Status: AC | PRN
  Administered 2023-01-16: 60 mL via INTRAVENOUS

## 2023-01-16 MED ORDER — PANTOPRAZOLE SODIUM 40 MG PO TBEC
40.0000 mg | DELAYED_RELEASE_TABLET | Freq: Every day | ORAL | Status: DC
  Administered 2023-01-16 – 2023-01-17 (×2): 40 mg via ORAL
  Filled 2023-01-16 (×2): qty 1

## 2023-01-16 MED ORDER — SODIUM CHLORIDE 0.9 % IV SOLN: INTRAVENOUS | Status: DC

## 2023-01-16 MED ORDER — CLOPIDOGREL BISULFATE 75 MG PO TABS
75.0000 mg | ORAL_TABLET | Freq: Every day | ORAL | Status: DC
  Administered 2023-01-16 – 2023-01-21 (×6): 75 mg via ORAL
  Filled 2023-01-16 (×6): qty 1

## 2023-01-16 MED ORDER — ISOSORBIDE MONONITRATE ER 30 MG PO TB24
30.0000 mg | ORAL_TABLET | Freq: Every evening | ORAL | Status: DC
  Administered 2023-01-16 – 2023-01-20 (×5): 30 mg via ORAL
  Filled 2023-01-16 (×5): qty 1

## 2023-01-16 NOTE — ED Provider Notes (Signed)
**Joseph [name] De-Identified via Obfuscation** Joseph Joseph [name]   CSN: [id number] Arrival date & time: 01/16/23  0546     History  Chief Complaint  Patient presents with   Abdominal Pain    Joseph Joseph [name] is a 82 y.o. male with a PMHx of DM, HTN, who presents to the ED with concerns for epigastric abdominal pain onset 3 days. Notes that his pain feels the same as when he was evaluated in the ED 3 days ago. He denies having a GI specialist at this time. Notes a burning sensation to his abdomen, he has been compliant with his medications. Notes that after he was discharged from being admitted, his symptoms were slightly better. Has an appointment with his PCP on 01/17/23. His last BM was yesterday with soft small volume stool. Has associated nausea. Denies fever, constipation, diarrhea, urinary symptoms, vomiting.   He also notes left upper chest wall pain this morning while at rest. His pain isn't worse with inspiration. No recent injury, trauma, fall, heavy lifting. His chest wall pain was initially at 5/10, now it is resolved after taking a NTG. Has a cardiac history of caths, stents. His cardiologist is Dr. Elease Hashimoto and he has an appointment in 1 month.    Per pt chart review: Pt was admitted to the hospital on 6/16-6/20 for a SBO. At his discharge, he was informed to follow up with [facility] in 2 months. Pt was evaluated in the ED on 01/13/23 for similar symptoms, at that time had negative lab work up. His CT AP showed panniculitis.   The history is provided by the patient. No language interpreter was used.       Home Medications Prior to Admission medications   Medication Sig Start Date End Date Taking? Authorizing Provider  acetaminophen (TYLENOL) 650 MG CR tablet Take 1 tablet (650 mg total) by mouth every 8 (eight) hours as needed for pain. 01/06/23  Yes Rolly Salter, MD  apixaban (ELIQUIS) 5 MG TABS tablet Take 1 tablet (5 mg total) by mouth 2 (two) times daily. 12/09/22   Yes Nahser, Deloris Ping, MD  carvedilol (COREG) 6.25 MG tablet TAKE 1 TABLET BY MOUTH TWICE A DAY Patient taking differently: Take 6.25 mg by mouth 2 (two) times daily with a meal. 06/29/22  Yes Nahser, Deloris Ping, MD  Cholecalciferol (VITAMIN D3) 25 MCG (1000 UT) CAPS Take 1,000 Units by mouth daily.   Yes [provider]  clopidogrel (PLAVIX) 75 MG tablet TAKE 1 TABLET BY MOUTH EVERY DAY Patient taking differently: Take 75 mg by mouth daily. 08/11/22  Yes Nahser, Deloris Ping, MD  diltiazem (CARDIZEM) 30 MG tablet Take 1 tablet (30 mg total) by mouth as needed (Rapid Palpitations). 01/31/18  Yes Weaver, Scott T, PA-C  fish oil-omega-3 fatty acids 1000 MG capsule Take 1 g by mouth at bedtime.   Yes [provider]  isosorbide mononitrate (IMDUR) 30 MG 24 hr tablet Take 30 mg by mouth every evening.   Yes [provider]  loperamide (IMODIUM) 2 MG capsule Take 2 mg by mouth as needed for diarrhea or loose stools.   Yes [provider]  nitroGLYCERIN (NITROSTAT) 0.4 MG SL tablet PLACE 1 TAB UNDER THE TONGUE EVERY 5 MINS AS NEEDED FOR CHEST PAIN FOR UP TO 3 DOSES Patient taking differently: Place 0.4 mg under the tongue every 5 (five) minutes as needed for chest pain. 05/07/22  Yes Nahser, Deloris Ping, MD  ondansetron (ZOFRAN) 4 MG  tablet Take 1 tablet (4 mg total) by mouth every 8 (eight) hours as needed for nausea or vomiting. 01/06/23 01/06/24 Yes Rolly Salter, MD  oxymetazoline (AFRIN) 0.05 % nasal spray Place 2 sprays into both nostrils at bedtime.   Yes [provider]  pantoprazole (PROTONIX) 40 MG tablet Take 1 tablet (40 mg total) by mouth daily. 01/06/23  Yes Rolly Salter, MD  polyethylene glycol (MIRALAX) 17 g packet Take 17 g by mouth daily. Patient taking differently: Take 17 g by mouth daily as needed for mild constipation. 01/06/23  Yes Rolly Salter, MD  sulfamethoxazole-trimethoprim (BACTRIM DS) 800-160 MG tablet Take 1 tablet by mouth 2 (two) times  daily. 01/12/23  Yes [provider]  telmisartan (MICARDIS) 20 MG tablet Take 1 tablet (20 mg total) by mouth daily. 01/06/23  Yes Rolly Salter, MD  TRESIBA FLEXTOUCH 100 UNIT/ML FlexTouch Pen Inject 10 Units into the skin daily. Patient taking differently: Inject 40-44 Units into the skin daily. Adjust based on blood sugar. 01/06/23  Yes Rolly Salter, MD  Zinc Acetate, Oral, (ZINC ACETATE PO) Take 1 tablet by mouth daily.   Yes [provider]  doxycycline (VIBRA-TABS) 100 MG tablet Take 100 mg by mouth 2 (two) times daily. Patient not taking: Reported on 01/16/2023 01/08/23   [provider]  ondansetron (ZOFRAN-ODT) 8 MG disintegrating tablet Take 1 tablet (8 mg total) by mouth every 8 (eight) hours as needed for nausea or vomiting. Patient not taking: Reported on 01/16/2023 01/13/23   Linwood Dibbles, MD  simethicone (MYLICON) 80 MG chewable tablet Chew 1 tablet (80 mg total) by mouth 4 (four) times daily. Patient not taking: Reported on 01/16/2023 01/06/23   Rolly Salter, MD      Allergies    Bempedoic acid, [name] [alirocumab], Rosuvastatin, Zetia [ezetimibe], Hydralazine, and Keflex [cephalexin]    Review of Systems   Review of Systems  Constitutional:  Negative for fever.  Respiratory:  Negative for shortness of breath.   Cardiovascular:  Positive for chest pain (resolved).  Gastrointestinal:  Positive for abdominal pain and nausea. Negative for constipation, diarrhea and vomiting.  All other systems reviewed and are negative.   Physical Exam Updated Vital Signs BP 130/69   Pulse 64   Resp (!) 23   Ht 6\' 2"  (1.88 m)   Wt 97.1 kg   SpO2 93%   BMI 27.48 kg/m  Physical Exam Vitals and nursing Joseph [name] reviewed.  Constitutional:      General: He is not in acute distress.    Appearance: Normal appearance.  Eyes:     General: No scleral icterus.    Extraocular Movements: Extraocular movements intact.  Cardiovascular:     Rate and Rhythm: Normal rate.   Pulmonary:     Effort: Pulmonary effort is normal. No respiratory distress.  Chest:     Chest wall: No tenderness.     Comments: No chest wall TTP. No overlying skin changes.  Abdominal:     Palpations: Abdomen is soft. There is no mass.     Tenderness: There is abdominal tenderness in the right lower quadrant, epigastric area and left lower quadrant.     Comments: TTP noted to epigastric, LLQ, RLQ regions.  Musculoskeletal:        General: Normal range of motion.     Cervical back: Neck supple.  Skin:    General: Skin is warm and dry.     Findings: No rash.  Neurological:  Mental Status: He is alert.     Sensory: Sensation is intact.     Motor: Motor function is intact.  Psychiatric:        Behavior: Behavior normal.     ED Results / Procedures / Treatments   Labs (all labs ordered are listed, but only abnormal results are displayed) Labs Reviewed  COMPREHENSIVE METABOLIC PANEL - Abnormal; Notable for the following components:      Result Value   Sodium 128 (*)    Chloride 96 (*)    CO2 21 (*)    Glucose, Bld 158 (*)    BUN 39 (*)    Creatinine, Ser 1.92 (*)    Calcium 8.5 (*)    Total Protein 5.7 (*)    Albumin 3.1 (*)    AST 88 (*)    ALT 88 (*)    GFR, Estimated 34 (*)    All other components within normal limits  CBC WITH DIFFERENTIAL/PLATELET - Abnormal; Notable for the following components:   HCT 38.0 (*)    Lymphs Abs 0.3 (*)    All other components within normal limits  D-DIMER, QUANTITATIVE - Abnormal; Notable for the following components:   D-Dimer, Quant 1.47 (*)    All other components within normal limits  BRAIN NATRIURETIC PEPTIDE - Abnormal; Notable for the following components:   B Natriuretic Peptide 314.1 (*)    All other components within normal limits  TROPONIN I (HIGH SENSITIVITY) - Abnormal; Notable for the following components:   Troponin I (High Sensitivity) 29 (*)    All other components within normal limits  TROPONIN I (HIGH  SENSITIVITY) - Abnormal; Notable for the following components:   Troponin I (High Sensitivity) 24 (*)    All other components within normal limits  SARS CORONAVIRUS 2 BY RT PCR  LIPASE, BLOOD  MAGNESIUM  URINALYSIS, ROUTINE W REFLEX MICROSCOPIC    EKG EKG Interpretation Date/Time:  Sunday January 16 2023 08:15:30 EDT Ventricular Rate:  73 PR Interval:    QRS Duration:  113 QT Interval:  391 QTC Calculation: 431 R Axis:   231  Text Interpretation: Right and left arm electrode reversal, interpretation assumes no reversal Atrial fibrillation Anteroseptal infarct, old Abnormal T, consider ischemia, lateral leads Confirmed by Coralee Pesa [phone]) on 01/16/2023 10:25:37 AM  Radiology CT ABDOMEN PELVIS W CONTRAST  Addendum Date: 01/16/2023   ADDENDUM REPORT: 01/16/2023 09:50 ADDENDUM: The 1st impression contains a typographical error, and should read NO evidence of appendicitis or other acute findings. Electronically Signed   By: Danae Orleans M.D.   On: 01/16/2023 09:50   Result Date: 01/16/2023 CLINICAL DATA:  Right lower quadrant abdominal pain. Prior prostatectomy for prostate carcinoma. * Tracking Code: BO * EXAM: CT ABDOMEN AND PELVIS WITH CONTRAST TECHNIQUE: Multidetector CT imaging of the abdomen and pelvis was performed using the standard protocol following bolus administration of intravenous contrast. RADIATION DOSE REDUCTION: This exam was performed according to the departmental dose-optimization program which includes automated exposure control, adjustment of the mA and/or kV according to patient size and/or use of iterative reconstruction technique. CONTRAST:  60mL OMNIPAQUE IOHEXOL 350 MG/ML SOLN COMPARISON:  01/13/2023 FINDINGS: Lower Chest: Dependent bibasilar atelectasis, similar to prior exam. Hepatobiliary: No suspicious hepatic masses identified. Layering sludge versus tiny gallstones 1 again seen, without evidence of cholecystitis or biliary ductal dilatation. Pancreas:  No mass  or inflammatory changes. Spleen: Within normal limits in size and appearance. Adrenals/Urinary Tract: No suspicious masses identified. Multiple small less than 1  cm bilateral renal calculi are seen. No evidence of ureteral calculi or hydronephrosis, however a 5 mm calculus is again seen in the urinary bladder near the left UVJ. Stomach/Bowel: No evidence of obstruction, inflammatory process or abnormal fluid collections. Diverticulosis is seen mainly involving the sigmoid colon, however there is no evidence of diverticulitis. Although the appendix is not directly visualized, no inflammatory process seen in region of the cecum or elsewhere. Vascular/Lymphatic: No pathologically enlarged lymph nodes. No acute vascular findings. Aortic atherosclerotic calcification incidentally noted. Reproductive: Prior prostatectomy. No mass or other significant abnormality identified. Other:  None. Musculoskeletal: No suspicious bone lesions identified. Lower lumbar spine fusion hardware again noted. IMPRESSION: Evidence of appendicitis or other acute findings. Bilateral nephrolithiasis. Unchanged 5 mm calculus in urinary bladder near the left UVJ. No evidence of hydroureteronephrosis. Colonic diverticulosis, without radiographic evidence of diverticulitis. Layering sludge versus tiny gallstones, without radiographic evidence of cholecystitis. Prior prostatectomy. No evidence of abdominal or pelvic metastatic disease. Aortic Atherosclerosis (ICD10-I70.0). Electronically Signed: By: Danae Orleans M.D. On: 01/16/2023 09:41   CT Angio Chest PE W and/or Wo Contrast  Result Date: 01/16/2023 CLINICAL DATA:  Chest pain. Elevated D-dimer. Intermediate probability for pulmonary embolism. EXAM: CT ANGIOGRAPHY CHEST WITH CONTRAST TECHNIQUE: Multidetector CT imaging of the chest was performed using the standard protocol during bolus administration of intravenous contrast. Multiplanar CT image reconstructions and MIPs were obtained to  evaluate the vascular anatomy. RADIATION DOSE REDUCTION: This exam was performed according to the departmental dose-optimization program which includes automated exposure control, adjustment of the mA and/or kV according to patient size and/or use of iterative reconstruction technique. CONTRAST:  60mL OMNIPAQUE IOHEXOL 350 MG/ML SOLN COMPARISON:  05/17/2019 FINDINGS: Cardiovascular: Satisfactory opacification of pulmonary arteries noted, and no pulmonary emboli identified. No evidence of thoracic aortic dissection. 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 Aortic and coronary atherosclerotic calcification incidentally noted. Mediastinum/Nodes: No masses or pathologically enlarged lymph nodes identified. Lungs/Pleura: Increased dependent atelectasis both lower lobes. No evidence of pulmonary consolidation mass, or pleural effusion. Upper abdomen: No acute findings. Musculoskeletal: No suspicious bone lesions identified. Review of the MIP images confirms the above findings. IMPRESSION: No evidence of pulmonary embolism. Increased dependent atelectasis in both lower lobes. 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 Recommend annual imaging followup by CTA or MRA. This recommendation follows 2010 ACCF/AHA/AATS/ACR/ASA/SCA/SCAI/SIR/STS/SVM Guidelines for the Diagnosis and Management of Patients with Thoracic Aortic Disease. Circulation. 2010; 121: Z610-R604. Aortic aneurysm NOS (ICD10-I71.9) Aortic Atherosclerosis (ICD10-I70.0). Electronically Signed   By: Danae Orleans M.D.   On: 01/16/2023 09:33   US Abdomen Limited RUQ (LIVER/GB)  Result Date: 01/16/2023 CLINICAL DATA:  82 year old male with history of transaminitis. EXAM: ULTRASOUND ABDOMEN LIMITED RIGHT UPPER QUADRANT COMPARISON:  Abdominal ultrasound 01/13/2023. FINDINGS: Gallbladder: No gallstones or wall thickening visualized. No sonographic Murphy sign noted by sonographer. Common bile duct: Diameter: 3.6 mm Liver: No focal lesion  identified. Diffusely increased hepatic echogenicity. Portal vein is patent on color Doppler imaging with normal direction of blood flow towards the liver. Other: None. IMPRESSION: 1. No acute findings. Specifically, no gallstones or findings to suggest an acute cholecystitis. 2. Diffusely increased hepatic echogenicity suggestive of hepatic steatosis. Electronically Signed   By: Trudie Reed M.D.   On: 01/16/2023 08:18   DG Abdomen 1 View  Result Date: 01/16/2023 CLINICAL DATA:  Abdominal pain. Recent small bowel obstruction 01/02/2023. EXAM: ABDOMEN - 1 VIEW COMPARISON:  01/05/2023 FINDINGS: Air is present throughout the colon. There are a few air-filled nondilated small  bowel loops present. No free peritoneal air. Remainder of the exam is unchanged. IMPRESSION: Nonspecific, nonobstructive bowel gas pattern. Electronically Signed   By: Elberta Fortis M.D.   On: 01/16/2023 08:12   DG Chest 2 View  Result Date: 01/16/2023 CLINICAL DATA:  Chest wall pain and abdominal pain. Recent small bowel obstruction 01/02/2023. EXAM: CHEST - 2 VIEW COMPARISON:  01/13/2023 FINDINGS: Lungs are adequately inflated with mild stable left base opacification likely atelectasis and less likely infection. No significant effusion. Cardiomediastinal silhouette and remainder of the exam is unchanged. IMPRESSION: Mild left base opacification likely atelectasis and less likely infection. Electronically Signed   By: Elberta Fortis M.D.   On: 01/16/2023 08:10    Procedures Procedures    Medications Ordered in ED Medications  0.9 %  sodium chloride infusion ( Intravenous New Bag/Given 01/16/23 0643)  ondansetron (ZOFRAN) injection 4 mg (4 mg Intravenous Given 01/16/23 0640)  fentaNYL (SUBLIMAZE) injection 50 mcg (50 mcg Intravenous Given 01/16/23 0641)  famotidine (PEPCID) IVPB 20 mg premix (0 mg Intravenous Stopped 01/16/23 0811)  alum & mag hydroxide-simeth (MAALOX/MYLANTA) 200-200-20 MG/5ML suspension 30 mL (30 mLs Oral  Given 01/16/23 0725)  iohexol (OMNIPAQUE) 350 MG/ML injection 60 mL (60 mLs Intravenous Contrast Given 01/16/23 0920)    ED Course/ Medical Decision Making/ A&P Clinical Course as of 01/16/23 1348  Sun Jan 16, 2023  0813 D-Dimer, Quant(!): 1.47 [SB]  0830 Pt re-evaluated and resting comfortably on stretcher. Discussed with patient lab and imaging findings. Pt with continued RLQ abdominal pain. Discussed with patient treatment plan. Answered all available questions.  [SB]  1032 Discussed with patient plans for admission, patient agreeable at this time.  [SB]  1042 Consult with hospitalist, Dr. Dierdre Searles who will evaluate the patient for admission.  [SB]  1134 Called patients daughter, Joseph Joseph [name] and confirmed HIPAA compliant information for patient. Pt confirmed two identifiers.  Discussed with Phoebe patients lab and imaging studies as well as plans for admission. Daughter is appreciative at this time. Answered all available questions.   [SB]    Clinical Course User Index [SB] Jamiere Gulas A, PA-C                             Medical Decision Making Amount and/or Complexity of Data Reviewed Labs: ordered. Decision-making details documented in ED Course. Radiology: ordered.  Risk OTC drugs. Prescription drug management. Decision regarding hospitalization.   Patient presents to the emergency department with concerns for epigastric abdominal pain x 3 days. On exam patient with no chest wall TTP. No overlying skin changes. TTP noted to epigastric, LLQ, RLQ region. Remainder of exam without acute findings.  Pt afebrile. Differential diagnosis includes pancreatitis, cholecystitis, appendicitis, diverticulitis,  GERD.  Additional history obtained:  External records from outside source obtained and reviewed including: Pt was admitted to the hospital on 6/16-6/20 for a SBO. At his discharge, he was informed to follow up with [facility] in 2 months. Pt was evaluated in the ED on 01/13/23 for similar  symptoms, at that time had negative lab work up. His CT AP showed panniculitis.   Labs:  I ordered, and personally interpreted labs.  The pertinent results include:  Initial troponin at 29, delta troponin at 24 Lipase unremarkable CBC without leujocytosis and unremarkable CMP with hyponatremia at 128, glucose at 158, Creatinine at 1.92, BUN at 39, AST and ALT elevated at 88.   Imaging: I ordered imaging studies including RUQ [place], CXR,  KUB, CTA Chest, CT AP I independently visualized and interpreted imaging which showed CT abdomen pelvis with  No evidence of appendicitis or other acute findings.    Bilateral nephrolithiasis.    Unchanged 5 mm calculus in urinary bladder near the left UVJ. No  evidence of hydroureteronephrosis.    Colonic diverticulosis, without radiographic evidence of  diverticulitis.    Layering sludge versus tiny gallstones, without radiographic  evidence of cholecystitis.    Prior prostatectomy. No evidence of abdominal or pelvic metastatic  disease.    Aortic Atherosclerosis (ICD10-I70.0).  CTA chest with  No evidence of pulmonary embolism.    Increased dependent atelectasis in both lower lobes.    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 Recommend annual  imaging followup by CTA or MRA. This recommendation follows 2010  ACCF/AHA/AATS/ACR/ASA/SCA/SCAI/SIR/STS/SVM Guidelines for the  Diagnosis and Management of Patients with Thoracic Aortic Disease.  Circulation. 2010; 121: U981-X914. Aortic aneurysm NOS (ICD10-I71.9)    Aortic Atherosclerosis (ICD10-I70.0).   Chest x-ray with  Mild left base opacification likely atelectasis and less likely  infection.   Right upper quadrant ultrasound with 1. No acute findings. Specifically, no gallstones or findings to  suggest an acute cholecystitis.  2. Diffusely increased hepatic echogenicity suggestive of hepatic  steatosis.   KUB with  Nonspecific, nonobstructive bowel gas pattern.     I  agree with the radiologist interpretation  Medications:  I ordered medication including fentanyl, IVF, zofran, pepcid, gi cocktail for symptom management.  Reevaluation of the patient after these medicines and interventions, I reevaluated the patient and found that they have improved I have reviewed the patients home medicines and have made adjustments as needed   Consultations: I requested consultation with the Hospitalist, Dr. Dierdre Searles and discussed lab and imaging findings as well as pertinent plan - they recommend: will evaluate for admission.    Disposition: Presentation suspicious for AKI and hyponatremia.  Also notable for continued epigastric abdominal pain status post recent admission.  Doubt concerns this time for pancreatitis, cholecystitis, appendicitis, diverticulitis.  Patient's symptoms improved slightly with GERD treatment. After consideration of the diagnostic results and the patients response to treatment, I feel that the patient would benefit from Admission to the hospital.  Discussed with patient plans for admission to the hospital.  Patient agreeable at this time.  Patient appears safe for admission at this time.  This chart was dictated using voice recognition software, Dragon. Despite the best efforts of this provider to proofread and correct errors, errors may still occur which can change documentation meaning.   Final Clinical Impression(s) / ED Diagnoses Final diagnoses:  AKI (acute kidney injury) (HCC)  Epigastric abdominal pain  Hyponatremia    Rx / DC Orders ED Discharge Orders     None         Hanifah Royse A, PA-C 01/16/23 1404    Horton, Clabe Seal, DO 01/16/23 1429

## 2023-01-16 NOTE — ED Notes (Signed)
Pt placed on 2L [place] of o2 due to sats dropping after pain meds, pt o2 sats now back stable. No other acute distress noted. Call bell in reach.

## 2023-01-16 NOTE — ED Notes (Signed)
Transported to CT 

## 2023-01-16 NOTE — ED Notes (Addendum)
Pt ambulated in hallway without oxygen on, sats 92-98%  No complaints other than being tired and needing sleep

## 2023-01-16 NOTE — Progress Notes (Signed)
NEW ADMISSION NOTE New Admission Note:   Arrival Method:  Patient arrived from the ED. Mental Orientation: Alert and oriented x 4. Telemetry: 59M 18 NSR Assessment: Completed Skin:  Warm, dry and intact.  IV: L AC NS @ 27ml/hr Pain: Dull pain in the abdomen 2/10 Tubes: N/A Safety Measures: Safety Fall Prevention Plan has been given, discussed and signed Admission: in process [facility] [place] Orientation: Patient has been orientated to the room, unit and staff.  Family: Daughter at bedside.  Orders have been reviewed and implemented. Will continue to monitor the patient. Call light has been placed within reach and bed alarm has been activated.   Arvilla Meres, RN

## 2023-01-16 NOTE — H&P (Addendum)
History and Physical    CREWE SKEETER WUJ:[id number] DOB: 04/01/1941 DOA: 01/16/2023  PCP: Erskine Emery, NP Patient coming from: home  Chief Complaint: nausea and abdominal pain  HPI: Joseph [name] is a 82 y.o. male with medical history significant of CAD, stent to LAD and PCI to RCA in 2004, DES to LAD and mid and distal Circumflex, PAF on Eliquis, T2DM, HTN, HLD intolerant to statins, CKD stage III, CVA, who presented with nausea and abdominal pain.   Patient was recently admitted between 6/16 and 01/06/2023 for small bowel obstruction.  General surgery was consulted.  He was treated with NG tube suction and supportive care, and eventually discharged home without surgical intervention.  He was able to tolerate a soft diet before the discharge.  Patient reports that he was initially doing okay after discharge.  However, he started to have intermittent nausea and abdominal pain 4 to 5 days ago.  His abdominal pain was cramping/dull/sharp pain, moderate to severe pain, intermittent and constant, with no radiation.  Denies vomiting, constipation or diarrhea.  Patient reports he has had decreased appetite due to nausea and not able to eat anything.  He has not had much bowel movement due to decreased appetite.  Associated symptoms included subjective fevers and chills but he did not take his temperature.  Patient lives by himself at home and denies sick contact.  Denies cough, wheezing, shortness of breath, chest pain/pressure, palpitations, dysuria, urinary frequency or urgency.  Patient came to ED on 01/13/2023 and CT abdomen suggested panniculitis, and his symptoms had improved with supportive care in the ED. patient reports persistent above symptoms and return to ED today.  In the ED, vital signs showed T98.60F, P98, RR 16, BP 112/57 and O2 sats 98% room air.  The labs showed sodium 128, CO2 21, BUN 39, creatinine 1.92, gap 11, AST 88, ALT 88, normal bilirubin, troponin 30, 29, 24, nonrevealing CBC,  D-dimer of 1.47.  EKG showed atrial fibrillation with no ischemic change. RUQ U/S showed hepatic steatosis. CXR showed no acute changes.  KUB showed no acute changes.  CT A/P Unchanged 5 mm calculus in urinary bladder near the left UVJ. No evidence of hydroureteronephrosis.  CTPA showed no PE or dissection. 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  Patient received IV fluids, Pepcid 20 mg IV x 1, fentanyl 50 mcg IV x 1, Zofran 4 mg IV x 1 at the ED   Review of Systems: As per HPI otherwise 10 point review of systems negative.  Review of Systems Otherwise negative except as per HPI, including: General: Denies night sweats or unintended weight loss.  Positive for severe fevers and chills.  Positive for decreased appetite, generalized weakness and fatigue. Resp: Denies cough, wheezing, shortness of breath. Cardiac: Denies chest pain, palpitations, orthopnea, paroxysmal nocturnal dyspnea. GI: Denies vomiting, diarrhea or constipation.  Positive for nausea and abdominal pain. GU: Denies dysuria, frequency, hesitancy or incontinence MS: Denies muscle aches, joint pain or swelling Neuro: Denies headache, neurologic deficits (focal weakness, numbness, tingling), abnormal gait Psych: Denies anxiety, depression, SI/HI/AVH Skin: Denies new rashes or lesions ID: Denies sick contacts, exotic exposures, travel  Past Medical History:  Diagnosis Date   Abdominal discomfort    Abdominal discomfort: EGD 2011 showed no significant abnormalities.  Possibly due to metformin.   Allergic rhinitis    Anticoagulated 05/30/2016   CAD (coronary artery disease)    a. s/p MI in 2004:  stents to the LAD and staged stent RCA,  EF 55%;  b.  NSTEMI (1/16):  LHC - mid LAD stent with diff dist restenosis, prox to mid Dx mod dsz jailed by stent, small OM1 99, mCFX 75, dCFX 80, mRCA stent ok, mPDA mild to mod dsz, EF 55% >> PCI:  Synergy DES to LAD; Synergy DES (x2) mid and dist CFX   CAD in native artery 06/12/2019    Chronic kidney disease, stage 2, mildly decreased GFR    stage 2-3   DDD (degenerative disc disease)    L4 to S1 degenerative disease and spondylolisthesis: low back pain.  s/p back surgery   DM (diabetes mellitus) (HCC)    History of CVA (cerebrovascular accident)    Small CVA seen by head CT (incidental) in 1/13.  Carotid dopplers in 1/13 showed minimal disease   History of Doppler ultrasound    Arterial dopplers (3/11): no evidence for significant PAD. ABIs 10/12: Normal.     HLD (hyperlipidemia)    HTN (hypertension)    had side effects with chlorthalidone   Hx of cardiovascular stress test    Myoview (1/13): Small fixed apical septal defect with no ischemia, EF 58%.   Hx of echocardiogram    Echo (3/11): EF 60%, normal wall motion, mild MR, mild left atrial enlargement, mildly dilated ascending aorta.   PAF (paroxysmal atrial fibrillation) (HCC) 05/29/2016   Spondylolisthesis    L4-S1   Statin intolerance     Past Surgical History:  Procedure Laterality Date   COLONOSCOPY N/A 12/26/2017   Procedure: COLONOSCOPY;  Surgeon: Benancio Deeds, MD;  Location: [facility] ENDOSCOPY;  Service: Gastroenterology;  Laterality: N/A;   COLONOSCOPY W/ POLYPECTOMY  12/2017   CORONARY ANGIOGRAM  11/13/2014   Procedure: CORONARY ANGIOGRAM;  Surgeon: Laurey Morale, MD;  Location: [facility] CATH LAB;  Service: Cardiovascular;;   CORONARY STENT INTERVENTION N/A 05/18/2019   Procedure: CORONARY STENT INTERVENTION;  Surgeon: Marykay Lex, MD;  Location: [facility] INVASIVE CV LAB;  Service: Cardiovascular;  Laterality: N/A;   CORONARY STENT PLACEMENT  2004   x2 LAD and RCA    LEFT HEART CATH AND CORONARY ANGIOGRAPHY N/A 05/18/2019   Procedure: LEFT HEART CATH AND CORONARY ANGIOGRAPHY;  Surgeon: Marykay Lex, MD;  Location: [facility] INVASIVE CV LAB;  Service: Cardiovascular;  Laterality: N/A;   LEFT HEART CATH AND CORONARY ANGIOGRAPHY N/A 06/11/2019   Procedure: LEFT HEART CATH AND CORONARY ANGIOGRAPHY;  Surgeon:  Yvonne Kendall, MD;  Location: [facility] INVASIVE CV LAB;  Service: Cardiovascular;  Laterality: N/A;   LEFT HEART CATH AND CORONARY ANGIOGRAPHY N/A 10/27/2020   Procedure: LEFT HEART CATH AND CORONARY ANGIOGRAPHY;  Surgeon: Lennette Bihari, MD;  Location: [facility] INVASIVE CV LAB;  Service: Cardiovascular;  Laterality: N/A;   LEFT HEART CATHETERIZATION WITH CORONARY ANGIOGRAM N/A 08/02/2014   Procedure: LEFT HEART CATHETERIZATION WITH CORONARY ANGIOGRAM;  Surgeon: Corky Crafts, MD;  Location: [facility] CATH LAB;  Service: Cardiovascular;  Laterality: N/A;   PROSTATE SURGERY     SPINAL FUSION     L4-S1    SOCIAL HISTORY:  reports that he has never smoked. He has never used smokeless tobacco. He reports that he does not drink alcohol and does not use drugs.  Allergies  Allergen Reactions   Bempedoic Acid Other (See Comments)    NEXLETOL Myalgias, GI upset   [name] [Alirocumab] Other (See Comments)    Muscle weakness and rash   Rosuvastatin Other (See Comments)    Low energy/leg and hip pain   Zetia [Ezetimibe] Other (  See Comments)    myalgias   Hydralazine Other (See Comments)    Muscle Aches   Keflex [Cephalexin] Other (See Comments)    Reaction unk    FAMILY HISTORY: Family History  Problem Relation Age of Onset   Heart attack Mother    Heart attack Father    Stroke Neg Hx      Prior to Admission medications   Medication Sig Start Date End Date Taking? Authorizing Provider  acetaminophen (TYLENOL) 650 MG CR tablet Take 1 tablet (650 mg total) by mouth every 8 (eight) hours as needed for pain. 01/06/23  Yes Rolly Salter, MD  apixaban (ELIQUIS) 5 MG TABS tablet Take 1 tablet (5 mg total) by mouth 2 (two) times daily. 12/09/22  Yes Nahser, Deloris Ping, MD  carvedilol (COREG) 6.25 MG tablet TAKE 1 TABLET BY MOUTH TWICE A DAY Patient taking differently: Take 6.25 mg by mouth 2 (two) times daily with a meal. 06/29/22  Yes Nahser, Deloris Ping, MD  Cholecalciferol (VITAMIN D3) 25 MCG (1000 UT)  CAPS Take 1,000 Units by mouth daily.   Yes [provider]  clopidogrel (PLAVIX) 75 MG tablet TAKE 1 TABLET BY MOUTH EVERY DAY Patient taking differently: Take 75 mg by mouth daily. 08/11/22  Yes Nahser, Deloris Ping, MD  diltiazem (CARDIZEM) 30 MG tablet Take 1 tablet (30 mg total) by mouth as needed (Rapid Palpitations). 01/31/18  Yes Weaver, Scott T, PA-C  fish oil-omega-3 fatty acids 1000 MG capsule Take 1 g by mouth at bedtime.   Yes [provider]  isosorbide mononitrate (IMDUR) 30 MG 24 hr tablet Take 30 mg by mouth every evening.   Yes [provider]  loperamide (IMODIUM) 2 MG capsule Take 2 mg by mouth as needed for diarrhea or loose stools.   Yes [provider]  nitroGLYCERIN (NITROSTAT) 0.4 MG SL tablet PLACE 1 TAB UNDER THE TONGUE EVERY 5 MINS AS NEEDED FOR CHEST PAIN FOR UP TO 3 DOSES Patient taking differently: Place 0.4 mg under the tongue every 5 (five) minutes as needed for chest pain. 05/07/22  Yes Nahser, Deloris Ping, MD  ondansetron (ZOFRAN) 4 MG tablet Take 1 tablet (4 mg total) by mouth every 8 (eight) hours as needed for nausea or vomiting. 01/06/23 01/06/24 Yes Rolly Salter, MD  oxymetazoline (AFRIN) 0.05 % nasal spray Place 2 sprays into both nostrils at bedtime.   Yes [provider]  pantoprazole (PROTONIX) 40 MG tablet Take 1 tablet (40 mg total) by mouth daily. 01/06/23  Yes Rolly Salter, MD  polyethylene glycol (MIRALAX) 17 g packet Take 17 g by mouth daily. Patient taking differently: Take 17 g by mouth daily as needed for mild constipation. 01/06/23  Yes Rolly Salter, MD  sulfamethoxazole-trimethoprim (BACTRIM DS) 800-160 MG tablet Take 1 tablet by mouth 2 (two) times daily. 01/12/23  Yes [provider]  telmisartan (MICARDIS) 20 MG tablet Take 1 tablet (20 mg total) by mouth daily. 01/06/23  Yes Rolly Salter, MD  TRESIBA FLEXTOUCH 100 UNIT/ML FlexTouch Pen Inject 10 Units into the skin daily. Patient taking  differently: Inject 40-44 Units into the skin daily. Adjust based on blood sugar. 01/06/23  Yes Rolly Salter, MD  Zinc Acetate, Oral, (ZINC ACETATE PO) Take 1 tablet by mouth daily.   Yes [provider]  doxycycline (VIBRA-TABS) 100 MG tablet Take 100 mg by mouth 2 (two) times daily. Patient not taking: Reported on 01/16/2023 01/08/23   [provider]  ondansetron (ZOFRAN-ODT) 8 MG disintegrating tablet Take 1 tablet (8 mg total) by mouth every 8 (eight) hours as needed for nausea or vomiting. Patient not taking: Reported on 01/16/2023 01/13/23   Linwood Dibbles, MD  simethicone (MYLICON) 80 MG chewable tablet Chew 1 tablet (80 mg total) by mouth 4 (four) times daily. Patient not taking: Reported on 01/16/2023 01/06/23   Rolly Salter, MD    Physical Exam: Vitals:   01/16/23 0830 01/16/23 1000 01/16/23 1100 01/16/23 1305  BP: 108/67 119/65  129/68  Pulse: 93 84  74  Resp: (!) 21 (!) 24  19  Temp:   98 F (36.7 C) 98.5 F (36.9 C)  SpO2: 94% 93%  93%  Weight:    97.5 kg  Height:    6\' 2"  (1.88 m)      Constitutional: NAD, calm, comfortable.  Acute and chronic ill-appearing. Eyes: PERRL, lids and conjunctivae normal ENMT: Mucous membranes are moist. Posterior pharynx clear of any exudate or lesions.Normal dentition.  Neck: normal, supple, no masses, no thyromegaly Respiratory: clear to auscultation bilaterally, no wheezing, no crackles. Normal respiratory effort. No accessory muscle use.  Cardiovascular: Regular rate and rhythm, no murmurs / rubs / gallops. No extremity edema. 2+ pedal pulses. No carotid bruits.  Abdomen: Mid abdomen tenderness to palpation without guarding or rebound tenderness, no masses palpated. No hepatosplenomegaly. Bowel sounds positive.  Musculoskeletal: no clubbing / cyanosis. No joint deformity upper and lower extremities. Good ROM, no contractures. Normal muscle tone.  Skin: no rashes, lesions, ulcers. No induration Neurologic: CN 2-12 grossly  intact. Sensation intact, DTR normal. Strength 5/5 in all 4.  Psychiatric: Normal judgment and insight. Alert and oriented x 3. Normal mood.     Labs on Admission: I have personally reviewed following labs and imaging studies  CBC: Recent Labs  Lab 01/13/23 1400 01/16/23 0555  WBC 15.4* 6.2  NEUTROABS 14.8* 5.5  HGB 13.8 13.1  HCT 40.1 38.0*  MCV 89.5 89.8  PLT 251 209   Basic Metabolic Panel: Recent Labs  Lab 01/13/23 1400 01/16/23 0555  Dietrich Samuelson 133* 128*  K 4.3 4.1  CL 102 96*  CO2 21* 21*  GLUCOSE 165* 158*  BUN 23 39*  CREATININE 1.28* 1.92*  CALCIUM 9.3 8.5*  MG  --  2.1   GFR: Estimated Creatinine Clearance: 33.3 mL/min (A) (by C-G formula based on SCr of 1.92 mg/dL (H)). Liver Function Tests: Recent Labs  Lab 01/13/23 1400 01/16/23 0555  AST 19 88*  ALT 24 88*  ALKPHOS 53 121  BILITOT 1.2 1.1  PROT 6.0* 5.7*  ALBUMIN 3.6 3.1*   Recent Labs  Lab 01/13/23 1400 01/16/23 0555  LIPASE 27 40   No results for input(s): "AMMONIA" in the last 168 hours. Coagulation Profile: No results for input(s): "INR", "PROTIME" in the last 168 hours. Cardiac Enzymes: No results for input(s): "CKTOTAL", "CKMB", "CKMBINDEX", "TROPONINI" in the last 168 hours. BNP (last 3 results) No results for input(s): "PROBNP" in the last 8760 hours. HbA1C: No results for input(s): "HGBA1C" in the last 72 hours. CBG: No results for input(s): "GLUCAP" in the last 168 hours. Lipid Profile: No results for input(s): "CHOL", "HDL", "LDLCALC", "TRIG", "CHOLHDL", "LDLDIRECT" in the last 72 hours. Thyroid Function Tests: No results for input(s): "TSH", "T4TOTAL", "FREET4", "T3FREE", "THYROIDAB" in the last 72 hours. Anemia Panel: No results for input(s): "VITAMINB12", "FOLATE", "FERRITIN", "TIBC", "IRON", "RETICCTPCT" in the last 72 hours. Urine analysis:    Component Value Date/Time   COLORURINE  YELLOW 01/02/2023 1337   APPEARANCEUR CLEAR 01/02/2023 1337   LABSPEC 1.018 01/02/2023  1337   PHURINE 5.0 01/02/2023 1337   GLUCOSEU NEGATIVE 01/02/2023 1337   HGBUR SMALL (A) 01/02/2023 1337   BILIRUBINUR NEGATIVE 01/02/2023 1337   KETONESUR NEGATIVE 01/02/2023 1337   PROTEINUR 100 (A) 01/02/2023 1337   UROBILINOGEN 0.2 07/28/2011 1922   NITRITE NEGATIVE 01/02/2023 1337   LEUKOCYTESUR NEGATIVE 01/02/2023 1337   Sepsis Labs: !!!!!!!!!!!!!!!!!!!!!!!!!!!!!!!!!!!!!!!!!!!! @LABRCNTIP (procalcitonin:4,lacticidven:4) )No results found for this or any previous visit (from the past 240 hour(s)).   Radiological Exams on Admission: CT ABDOMEN PELVIS W CONTRAST  Addendum Date: 01/16/2023   ADDENDUM REPORT: 01/16/2023 09:50 ADDENDUM: The 1st impression contains a typographical error, and should read NO evidence of appendicitis or other acute findings. Electronically Signed   By: Danae Orleans M.D.   On: 01/16/2023 09:50   Result Date: 01/16/2023 CLINICAL DATA:  Right lower quadrant abdominal pain. Prior prostatectomy for prostate carcinoma. * Tracking Code: BO * EXAM: CT ABDOMEN AND PELVIS WITH CONTRAST TECHNIQUE: Multidetector CT imaging of the abdomen and pelvis was performed using the standard protocol following bolus administration of intravenous contrast. RADIATION DOSE REDUCTION: This exam was performed according to the departmental dose-optimization program which includes automated exposure control, adjustment of the mA and/or kV according to patient size and/or use of iterative reconstruction technique. CONTRAST:  60mL OMNIPAQUE IOHEXOL 350 MG/ML SOLN COMPARISON:  01/13/2023 FINDINGS: Lower Chest: Dependent bibasilar atelectasis, similar to prior exam. Hepatobiliary: No suspicious hepatic masses identified. Layering sludge versus tiny gallstones 1 again seen, without evidence of cholecystitis or biliary ductal dilatation. Pancreas:  No mass or inflammatory changes. Spleen: Within normal limits in size and appearance. Adrenals/Urinary Tract: No suspicious masses identified. Multiple small  less than 1 cm bilateral renal calculi are seen. No evidence of ureteral calculi or hydronephrosis, however a 5 mm calculus is again seen in the urinary bladder near the left UVJ. Stomach/Bowel: No evidence of obstruction, inflammatory process or abnormal fluid collections. Diverticulosis is seen mainly involving the sigmoid colon, however there is no evidence of diverticulitis. Although the appendix is not directly visualized, no inflammatory process seen in region of the cecum or elsewhere. Vascular/Lymphatic: No pathologically enlarged lymph nodes. No acute vascular findings. Aortic atherosclerotic calcification incidentally noted. Reproductive: Prior prostatectomy. No mass or other significant abnormality identified. Other:  None. Musculoskeletal: No suspicious bone lesions identified. Lower lumbar spine fusion hardware again noted. IMPRESSION: Evidence of appendicitis or other acute findings. Bilateral nephrolithiasis. Unchanged 5 mm calculus in urinary bladder near the left UVJ. No evidence of hydroureteronephrosis. Colonic diverticulosis, without radiographic evidence of diverticulitis. Layering sludge versus tiny gallstones, without radiographic evidence of cholecystitis. Prior prostatectomy. No evidence of abdominal or pelvic metastatic disease. Aortic Atherosclerosis (ICD10-I70.0). Electronically Signed: By: Danae Orleans M.D. On: 01/16/2023 09:41   CT Angio Chest PE W and/or Wo Contrast  Result Date: 01/16/2023 CLINICAL DATA:  Chest pain. Elevated D-dimer. Intermediate probability for pulmonary embolism. EXAM: CT ANGIOGRAPHY CHEST WITH CONTRAST TECHNIQUE: Multidetector CT imaging of the chest was performed using the standard protocol during bolus administration of intravenous contrast. Multiplanar CT image reconstructions and MIPs were obtained to evaluate the vascular anatomy. RADIATION DOSE REDUCTION: This exam was performed according to the departmental dose-optimization program which includes  automated exposure control, adjustment of the mA and/or kV according to patient size and/or use of iterative reconstruction technique. CONTRAST:  60mL OMNIPAQUE IOHEXOL 350 MG/ML SOLN COMPARISON:  05/17/2019 FINDINGS: Cardiovascular: Satisfactory opacification of pulmonary arteries noted, and no  pulmonary emboli identified. No evidence of thoracic aortic dissection. 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 Aortic and coronary atherosclerotic calcification incidentally noted. Mediastinum/Nodes: No masses or pathologically enlarged lymph nodes identified. Lungs/Pleura: Increased dependent atelectasis both lower lobes. No evidence of pulmonary consolidation mass, or pleural effusion. Upper abdomen: No acute findings. Musculoskeletal: No suspicious bone lesions identified. Review of the MIP images confirms the above findings. IMPRESSION: No evidence of pulmonary embolism. Increased dependent atelectasis in both lower lobes. 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 Recommend annual imaging followup by CTA or MRA. This recommendation follows 2010 ACCF/AHA/AATS/ACR/ASA/SCA/SCAI/SIR/STS/SVM Guidelines for the Diagnosis and Management of Patients with Thoracic Aortic Disease. Circulation. 2010; 121: Z610-R604. Aortic aneurysm NOS (ICD10-I71.9) Aortic Atherosclerosis (ICD10-I70.0). Electronically Signed   By: Danae Orleans M.D.   On: 01/16/2023 09:33   US Abdomen Limited RUQ (LIVER/GB)  Result Date: 01/16/2023 CLINICAL DATA:  82 year old male with history of transaminitis. EXAM: ULTRASOUND ABDOMEN LIMITED RIGHT UPPER QUADRANT COMPARISON:  Abdominal ultrasound 01/13/2023. FINDINGS: Gallbladder: No gallstones or wall thickening visualized. No sonographic Murphy sign noted by sonographer. Common bile duct: Diameter: 3.6 mm Liver: No focal lesion identified. Diffusely increased hepatic echogenicity. Portal vein is patent on color Doppler imaging with normal direction of blood flow towards the liver. Other:  None. IMPRESSION: 1. No acute findings. Specifically, no gallstones or findings to suggest an acute cholecystitis. 2. Diffusely increased hepatic echogenicity suggestive of hepatic steatosis. Electronically Signed   By: Trudie Reed M.D.   On: 01/16/2023 08:18   DG Abdomen 1 View  Result Date: 01/16/2023 CLINICAL DATA:  Abdominal pain. Recent small bowel obstruction 01/02/2023. EXAM: ABDOMEN - 1 VIEW COMPARISON:  01/05/2023 FINDINGS: Air is present throughout the colon. There are a few air-filled nondilated small bowel loops present. No free peritoneal air. Remainder of the exam is unchanged. IMPRESSION: Nonspecific, nonobstructive bowel gas pattern. Electronically Signed   By: Elberta Fortis M.D.   On: 01/16/2023 08:12   DG Chest 2 View  Result Date: 01/16/2023 CLINICAL DATA:  Chest wall pain and abdominal pain. Recent small bowel obstruction 01/02/2023. EXAM: CHEST - 2 VIEW COMPARISON:  01/13/2023 FINDINGS: Lungs are adequately inflated with mild stable left base opacification likely atelectasis and less likely infection. No significant effusion. Cardiomediastinal silhouette and remainder of the exam is unchanged. IMPRESSION: Mild left base opacification likely atelectasis and less likely infection. Electronically Signed   By: Elberta Fortis M.D.   On: 01/16/2023 08:10     All images have been reviewed by me personally.  EKG: Independently reviewed.   Assessment/Plan Principal Problem:   Pain in the abdomen Active Problems:   Paroxysmal atrial fibrillation (HCC)   Essential hypertension   Type 2 diabetes mellitus with vascular disease (HCC)   CKD (chronic kidney disease), stage III (HCC)   History of CVA (cerebrovascular accident)   Hyperlipidemia   CAD S/P percutaneous coronary angioplasty   Peripheral vascular disease (HCC)   Anticoagulated   CAD in native artery   Hyponatremia   AKI (acute kidney injury) (HCC)   Elevated troponin   Overweight     Assessment and  Plan: Assessment Plan  # Nausea and abdomen pain # recent admission for SBO 6/16- 6/20 # Hx of PAD  Patient presented with nausea and abdominal pain.  Per patient his symptoms were similar to his symptoms from recent admission for SBO.  Workup in ED did not find any acute etiology except for a Unchanged 5 mm calculus in urinary bladder near the left  UVJ. No evidence of hydroureteronephrosis.   Will obtain UA to see if he has UTI given his bladder stone. Supportive care.   - med tele Obs - UA and UDS pending - COVID pending- ? GI presentation? - Lipase negative - Lactic acid pending ( hx of PAD) - IVF for thyration - CL diet advance as tol  -Antiemesis and pain management -Imaging study showed no evidence of SBO   # Hyponatremia # AKI # CKD stage III( BL Cr 1.28-1.34)  - Verneal Wiers 128 -BUN 39, creatinine 1.92 on admission -Etiology is likely due to prerenal from decreased oral intake -IV fluids for hydration- NS 88m/h x 24 h -Clear liquid diet -Follow-up BMP -He received contrast CT scan at ED, will need to monitor renal function closely - hold home ARB   # Elevated troponin  - mildly elevated and flat - EKG- Afib with no ischemic change -Likely demand supply   # PAF on Eliquis # CAD status post stents # 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 I do not see outpt vascular/CTS FU and he will need a referral before DC. He is non smoker  -Rate controlled A-fib on EKG -Continue home Eliquis  # T2DM -Hemoglobin A1c 7.5 on 12/22/2022 -Clear liquid diet -Glucose ACHS   # HTN and HLD intolerant to statins -Continue home meds  # History of CVA # Overweight with BMI 27.48  Body mass index is 27.48 kg/m.  -Chronic and stable -Weight loss per PCP as outpatient       DVT prophylaxis: SCD and Eliquis Code Status: Full code Family Communication: none at bedside Consults called: none  Admission status:  obs  Status is: Observation The patient remains OBS appropriate and will d/c before 2 midnights.   Time Spent: 65 minutes.  >50% of the time was devoted to discussing the patients care, assessment, plan and disposition with other care givers along with counseling the patient about the risks and benefits of treatment.    Dede Query MD [facility]  If 7PM-7AM, please contact night-coverage   01/16/2023, 1:32 PM

## 2023-01-16 NOTE — ED Notes (Signed)
ED TO INPATIENT HANDOFF REPORT  ED Nurse Name and Phone #:   S Name/Age/Gender Joseph [name] 82 y.o. male Room/Bed: 009C/009C  Code Status   Code Status: Full Code  Home/SNF/Other Home Patient oriented to: self, place, time, and situation Is this baseline? Yes   Triage Complete: Triage complete  Chief Complaint Pain in the abdomen [R10.9]  Triage Note Pt BIB EMS c/o abdominal pain, has been seen for this recently dx with small bowel obstruction on 6/16-6/20. Pt states pain still bad in upper left epigastric area that is sharp. Pt took nitroglycerin. Cardiac hx.    Allergies Allergies  Allergen Reactions   Bempedoic Acid Other (See Comments)    NEXLETOL Myalgias, GI upset   [name] [Alirocumab] Other (See Comments)    Muscle weakness and rash   Rosuvastatin Other (See Comments)    Low energy/leg and hip pain   Zetia [Ezetimibe] Other (See Comments)    myalgias   Hydralazine Other (See Comments)    Muscle Aches   Keflex [Cephalexin] Other (See Comments)    Reaction unk    Level of Care/Admitting Diagnosis ED Disposition     ED Disposition  Admit   Condition  --   Comment  Hospital Area: [facility] [[id number]]  Level of Care: Telemetry Medical [104]  May place patient in observation at [facility] or [facility] if equivalent level of care is available:: Yes  Covid Evaluation: Asymptomatic - no recent exposure (last 10 days) testing not required  Diagnosis: Pain in the abdomen [161096]  Admitting Physician: Dede Query [[id number]]  Attending Physician: Dierdre Searles, NA [[id number]]          B Medical/Surgery History Past Medical History:  Diagnosis Date   Abdominal discomfort    Abdominal discomfort: EGD 2011 showed no significant abnormalities.  Possibly due to metformin.   Allergic rhinitis    Anticoagulated 05/30/2016   CAD (coronary artery disease)    a. s/p MI in 2004:  stents to the LAD and staged stent RCA, EF 55%;  b.  NSTEMI (1/16):  LHC - mid LAD  stent with diff dist restenosis, prox to mid Dx mod dsz jailed by stent, small OM1 99, mCFX 75, dCFX 80, mRCA stent ok, mPDA mild to mod dsz, EF 55% >> PCI:  Synergy DES to LAD; Synergy DES (x2) mid and dist CFX   CAD in native artery 06/12/2019   Chronic kidney disease, stage 2, mildly decreased GFR    stage 2-3   DDD (degenerative disc disease)    L4 to S1 degenerative disease and spondylolisthesis: low back pain.  s/p back surgery   DM (diabetes mellitus) (HCC)    History of CVA (cerebrovascular accident)    Small CVA seen by head CT (incidental) in 1/13.  Carotid dopplers in 1/13 showed minimal disease   History of Doppler ultrasound    Arterial dopplers (3/11): no evidence for significant PAD. ABIs 10/12: Normal.     HLD (hyperlipidemia)    HTN (hypertension)    had side effects with chlorthalidone   Hx of cardiovascular stress test    Myoview (1/13): Small fixed apical septal defect with no ischemia, EF 58%.   Hx of echocardiogram    Echo (3/11): EF 60%, normal wall motion, mild MR, mild left atrial enlargement, mildly dilated ascending aorta.   PAF (paroxysmal atrial fibrillation) (HCC) 05/29/2016   Spondylolisthesis    L4-S1   Statin intolerance    Past Surgical History:  Procedure Laterality  Date   COLONOSCOPY N/A 12/26/2017   Procedure: COLONOSCOPY;  Surgeon: Benancio Deeds, MD;  Location: [facility] ENDOSCOPY;  Service: Gastroenterology;  Laterality: N/A;   COLONOSCOPY W/ POLYPECTOMY  12/2017   CORONARY ANGIOGRAM  11/13/2014   Procedure: CORONARY ANGIOGRAM;  Surgeon: Laurey Morale, MD;  Location: [facility] CATH LAB;  Service: Cardiovascular;;   CORONARY STENT INTERVENTION N/A 05/18/2019   Procedure: CORONARY STENT INTERVENTION;  Surgeon: Marykay Lex, MD;  Location: [facility] INVASIVE CV LAB;  Service: Cardiovascular;  Laterality: N/A;   CORONARY STENT PLACEMENT  2004   x2 LAD and RCA    LEFT HEART CATH AND CORONARY ANGIOGRAPHY N/A 05/18/2019   Procedure: LEFT HEART CATH AND  CORONARY ANGIOGRAPHY;  Surgeon: Marykay Lex, MD;  Location: [facility] INVASIVE CV LAB;  Service: Cardiovascular;  Laterality: N/A;   LEFT HEART CATH AND CORONARY ANGIOGRAPHY N/A 06/11/2019   Procedure: LEFT HEART CATH AND CORONARY ANGIOGRAPHY;  Surgeon: Yvonne Kendall, MD;  Location: [facility] INVASIVE CV LAB;  Service: Cardiovascular;  Laterality: N/A;   LEFT HEART CATH AND CORONARY ANGIOGRAPHY N/A 10/27/2020   Procedure: LEFT HEART CATH AND CORONARY ANGIOGRAPHY;  Surgeon: Lennette Bihari, MD;  Location: [facility] INVASIVE CV LAB;  Service: Cardiovascular;  Laterality: N/A;   LEFT HEART CATHETERIZATION WITH CORONARY ANGIOGRAM N/A 08/02/2014   Procedure: LEFT HEART CATHETERIZATION WITH CORONARY ANGIOGRAM;  Surgeon: Corky Crafts, MD;  Location: [facility] CATH LAB;  Service: Cardiovascular;  Laterality: N/A;   PROSTATE SURGERY     SPINAL FUSION     L4-S1     A IV Location/Drains/Wounds Patient Lines/Drains/Airways Status     Active Line/Drains/Airways     Name Placement date Placement time Site Days   Peripheral IV 01/16/23 20 G 1" Left Antecubital 01/16/23  0553  Antecubital  less than 1            Intake/Output Last 24 hours No intake or output data in the 24 hours ending 01/16/23 1158  Labs/Imaging Results for orders placed or performed during the hospital encounter of 01/16/23 (from the past 48 hour(s))  Comprehensive metabolic panel     Status: Abnormal   Collection Time: 01/16/23  5:55 AM  Result Value Ref Range   Sodium 128 (L) 135 - 145 mmol/L   Potassium 4.1 3.5 - 5.1 mmol/L   Chloride 96 (L) 98 - 111 mmol/L   CO2 21 (L) 22 - 32 mmol/L   Glucose, Bld 158 (H) 70 - 99 mg/dL    Comment: Glucose reference range applies only to samples taken after fasting for at least 8 hours.   BUN 39 (H) 8 - 23 mg/dL   Creatinine, Ser 6.29 (H) 0.61 - 1.24 mg/dL   Calcium 8.5 (L) 8.9 - 10.3 mg/dL   Total Protein 5.7 (L) 6.5 - 8.1 g/dL   Albumin 3.1 (L) 3.5 - 5.0 g/dL   AST 88 (H) 15 - 41 U/L   ALT  88 (H) 0 - 44 U/L   Alkaline Phosphatase 121 38 - 126 U/L   Total Bilirubin 1.1 0.3 - 1.2 mg/dL   GFR, Estimated 34 (L) >60 mL/min    Comment: (NOTE) Calculated using the CKD-EPI Creatinine Equation (2021)    Anion gap 11 5 - 15    Comment: Performed at [facility] Lab, [address], [place], [place] [address]  Lipase, blood     Status: None   Collection Time: 01/16/23  5:55 AM  Result Value Ref Range   Lipase 40  11 - 51 U/L    Comment: Performed at [facility], [address], [place], [place] [address]  CBC with Diff     Status: Abnormal   Collection Time: 01/16/23  5:55 AM  Result Value Ref Range   WBC 6.2 4.0 - 10.5 K/uL   RBC 4.23 4.22 - 5.81 MIL/uL   Hemoglobin 13.1 13.0 - 17.0 g/dL   HCT 60.4 (L) 54.0 - 98.1 %   MCV 89.8 80.0 - 100.0 fL   MCH 31.0 26.0 - 34.0 pg   MCHC 34.5 30.0 - 36.0 g/dL   RDW 19.1 47.8 - 29.5 %   Platelets 209 150 - 400 K/uL   nRBC 0.0 0.0 - 0.2 %   Neutrophils Relative % 87 %   Neutro Abs 5.5 1.7 - 7.7 K/uL   Lymphocytes Relative 4 %   Lymphs Abs 0.3 (L) 0.7 - 4.0 K/uL   Monocytes Relative 5 %   Monocytes Absolute 0.3 0.1 - 1.0 K/uL   Eosinophils Relative 2 %   Eosinophils Absolute 0.1 0.0 - 0.5 K/uL   Basophils Relative 1 %   Basophils Absolute 0.0 0.0 - 0.1 K/uL   Immature Granulocytes 1 %   Abs Immature Granulocytes 0.03 0.00 - 0.07 K/uL    Comment: Performed at [facility], [address] [place], [place] [address]  Troponin I (High Sensitivity)     Status: Abnormal   Collection Time: 01/16/23  5:55 AM  Result Value Ref Range   Troponin I (High Sensitivity) 29 (H) <18 ng/L    Comment: (NOTE) Elevated high sensitivity troponin I (hsTnI) values and significant  changes across serial measurements may suggest ACS but many other  chronic and acute conditions are known to elevate hsTnI results.  Refer to the "Links" section for chest pain algorithms and additional  guidance. Performed at [facility], [address] [place], [place] [address]   D-dimer, quantitative     Status: Abnormal   Collection Time: 01/16/23  5:55 AM  Result Value Ref Range   D-Dimer, Quant 1.47 (H) 0.00 - 0.50 ug/mL-FEU    Comment: (NOTE) At the manufacturer cut-off value of 0.5 g/mL FEU, this assay has a negative predictive value of 95-100%.This assay is intended for use in conjunction with a clinical pretest probability (PTP) assessment model to exclude pulmonary embolism (PE) and deep venous thrombosis (DVT) in outpatients suspected of PE or DVT. Results should be correlated with clinical presentation. Performed at [facility], [address], [place], [place] [address]   Brain natriuretic peptide     Status: Abnormal   Collection Time: 01/16/23  5:55 AM  Result Value Ref Range   B Natriuretic Peptide 314.1 (H) 0.0 - 100.0 pg/mL    Comment: Performed at [facility], [address], [place], [place] [address]  Troponin I (High Sensitivity)     Status: Abnormal   Collection Time: 01/16/23  8:15 AM  Result Value Ref Range   Troponin I (High Sensitivity) 24 (H) <18 ng/L    Comment: (NOTE) Elevated high sensitivity troponin I (hsTnI) values and significant  changes across serial measurements may suggest ACS but many other  chronic and acute conditions are known to elevate hsTnI results.  Refer to the "Links" section for chest pain algorithms and additional  guidance. Performed at [facility], [address], [place], [place] [address]    CT ABDOMEN PELVIS W CONTRAST  Addendum  Date: 01/16/2023   ADDENDUM REPORT: 01/16/2023 09:50 ADDENDUM: The 1st impression contains a typographical error, and should read NO evidence of appendicitis or other acute findings. Electronically Signed   By: Danae Orleans M.D.   On: 01/16/2023 09:50   Result Date: 01/16/2023 CLINICAL DATA:  Right lower quadrant abdominal pain. Prior prostatectomy for prostate carcinoma. * Tracking Code: BO * EXAM: CT ABDOMEN AND PELVIS  WITH CONTRAST TECHNIQUE: Multidetector CT imaging of the abdomen and pelvis was performed using the standard protocol following bolus administration of intravenous contrast. RADIATION DOSE REDUCTION: This exam was performed according to the departmental dose-optimization program which includes automated exposure control, adjustment of the mA and/or kV according to patient size and/or use of iterative reconstruction technique. CONTRAST:  60mL OMNIPAQUE IOHEXOL 350 MG/ML SOLN COMPARISON:  01/13/2023 FINDINGS: Lower Chest: Dependent bibasilar atelectasis, similar to prior exam. Hepatobiliary: No suspicious hepatic masses identified. Layering sludge versus tiny gallstones 1 again seen, without evidence of cholecystitis or biliary ductal dilatation. Pancreas:  No mass or inflammatory changes. Spleen: Within normal limits in size and appearance. Adrenals/Urinary Tract: No suspicious masses identified. Multiple small less than 1 cm bilateral renal calculi are seen. No evidence of ureteral calculi or hydronephrosis, however a 5 mm calculus is again seen in the urinary bladder near the left UVJ. Stomach/Bowel: No evidence of obstruction, inflammatory process or abnormal fluid collections. Diverticulosis is seen mainly involving the sigmoid colon, however there is no evidence of diverticulitis. Although the appendix is not directly visualized, no inflammatory process seen in region of the cecum or elsewhere. Vascular/Lymphatic: No pathologically enlarged lymph nodes. No acute vascular findings. Aortic atherosclerotic calcification incidentally noted. Reproductive: Prior prostatectomy. No mass or other significant abnormality identified. Other:  None. Musculoskeletal: No suspicious bone lesions identified. Lower lumbar spine fusion hardware again noted. IMPRESSION: Evidence of appendicitis or other acute findings. Bilateral nephrolithiasis. Unchanged 5 mm calculus in urinary bladder near the left UVJ. No evidence of  hydroureteronephrosis. Colonic diverticulosis, without radiographic evidence of diverticulitis. Layering sludge versus tiny gallstones, without radiographic evidence of cholecystitis. Prior prostatectomy. No evidence of abdominal or pelvic metastatic disease. Aortic Atherosclerosis (ICD10-I70.0). Electronically Signed: By: Danae Orleans M.D. On: 01/16/2023 09:41   CT Angio Chest PE W and/or Wo Contrast  Result Date: 01/16/2023 CLINICAL DATA:  Chest pain. Elevated D-dimer. Intermediate probability for pulmonary embolism. EXAM: CT ANGIOGRAPHY CHEST WITH CONTRAST TECHNIQUE: Multidetector CT imaging of the chest was performed using the standard protocol during bolus administration of intravenous contrast. Multiplanar CT image reconstructions and MIPs were obtained to evaluate the vascular anatomy. RADIATION DOSE REDUCTION: This exam was performed according to the departmental dose-optimization program which includes automated exposure control, adjustment of the mA and/or kV according to patient size and/or use of iterative reconstruction technique. CONTRAST:  60mL OMNIPAQUE IOHEXOL 350 MG/ML SOLN COMPARISON:  05/17/2019 FINDINGS: Cardiovascular: Satisfactory opacification of pulmonary arteries noted, and no pulmonary emboli identified. No evidence of thoracic aortic dissection. 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 Aortic and coronary atherosclerotic calcification incidentally noted. Mediastinum/Nodes: No masses or pathologically enlarged lymph nodes identified. Lungs/Pleura: Increased dependent atelectasis both lower lobes. No evidence of pulmonary consolidation mass, or pleural effusion. Upper abdomen: No acute findings. Musculoskeletal: No suspicious bone lesions identified. Review of the MIP images confirms the above findings. IMPRESSION: No evidence of pulmonary embolism. Increased dependent atelectasis in both lower lobes. 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 Recommend annual imaging  followup by CTA or MRA. This recommendation follows 2010 ACCF/AHA/AATS/ACR/ASA/SCA/SCAI/SIR/STS/SVM Guidelines for  the Diagnosis and Management of Patients with Thoracic Aortic Disease. Circulation. 2010; 121: Z610-R604. Aortic aneurysm NOS (ICD10-I71.9) Aortic Atherosclerosis (ICD10-I70.0). Electronically Signed   By: Danae Orleans M.D.   On: 01/16/2023 09:33   US Abdomen Limited RUQ (LIVER/GB)  Result Date: 01/16/2023 CLINICAL DATA:  82 year old male with history of transaminitis. EXAM: ULTRASOUND ABDOMEN LIMITED RIGHT UPPER QUADRANT COMPARISON:  Abdominal ultrasound 01/13/2023. FINDINGS: Gallbladder: No gallstones or wall thickening visualized. No sonographic Murphy sign noted by sonographer. Common bile duct: Diameter: 3.6 mm Liver: No focal lesion identified. Diffusely increased hepatic echogenicity. Portal vein is patent on color Doppler imaging with normal direction of blood flow towards the liver. Other: None. IMPRESSION: 1. No acute findings. Specifically, no gallstones or findings to suggest an acute cholecystitis. 2. Diffusely increased hepatic echogenicity suggestive of hepatic steatosis. Electronically Signed   By: Trudie Reed M.D.   On: 01/16/2023 08:18   DG Abdomen 1 View  Result Date: 01/16/2023 CLINICAL DATA:  Abdominal pain. Recent small bowel obstruction 01/02/2023. EXAM: ABDOMEN - 1 VIEW COMPARISON:  01/05/2023 FINDINGS: Air is present throughout the colon. There are a few air-filled nondilated small bowel loops present. No free peritoneal air. Remainder of the exam is unchanged. IMPRESSION: Nonspecific, nonobstructive bowel gas pattern. Electronically Signed   By: Elberta Fortis M.D.   On: 01/16/2023 08:12   DG Chest 2 View  Result Date: 01/16/2023 CLINICAL DATA:  Chest wall pain and abdominal pain. Recent small bowel obstruction 01/02/2023. EXAM: CHEST - 2 VIEW COMPARISON:  01/13/2023 FINDINGS: Lungs are adequately inflated with mild stable left base opacification likely  atelectasis and less likely infection. No significant effusion. Cardiomediastinal silhouette and remainder of the exam is unchanged. IMPRESSION: Mild left base opacification likely atelectasis and less likely infection. Electronically Signed   By: Elberta Fortis M.D.   On: 01/16/2023 08:10    Pending Labs Unresulted Labs (From admission, onward)     Start     Ordered   01/16/23 1141  SARS Coronavirus 2 by RT PCR (hospital order, performed in [facility] hospital lab) *cepheid single result test* Anterior Nasal Swab  (Tier 2 - SARS Coronavirus 2 by RT PCR (hospital order, performed in [facility] hospital lab) *cepheid single result test*)  Once,   R        01/16/23 1140   01/16/23 1055  Urinalysis, Routine w reflex microscopic -Urine, Clean Catch  Once,   URGENT       Question:  Specimen Source  Answer:  Urine, Clean Catch   01/16/23 1054   01/16/23 0724  Magnesium  Add-on,   AD        01/16/23 0725            Vitals/Pain Today's Vitals   01/16/23 0815 01/16/23 0830 01/16/23 1000 01/16/23 1100  BP: 108/64 108/67 119/65   Pulse: 91 93 84   Resp: 20 (!) 21 (!) 24   Temp:    98 F (36.7 C)  SpO2: 97% 94% 93%   Weight:      Height:      PainSc:        Isolation Precautions Airborne and Contact precautions  Medications Medications  0.9 %  sodium chloride infusion ( Intravenous New Bag/Given 01/16/23 0643)  ondansetron (ZOFRAN) injection 4 mg (4 mg Intravenous Given 01/16/23 0640)  fentaNYL (SUBLIMAZE) injection 50 mcg (50 mcg Intravenous Given 01/16/23 0641)  famotidine (PEPCID) IVPB 20 mg premix (0 mg Intravenous Stopped 01/16/23 0811)  alum & mag hydroxide-simeth (  MAALOX/MYLANTA) 200-200-20 MG/5ML suspension 30 mL (30 mLs Oral Given 01/16/23 0725)  iohexol (OMNIPAQUE) 350 MG/ML injection 60 mL (60 mLs Intravenous Contrast Given 01/16/23 0920)    Mobility walks     Focused Assessments Renal Assessment Handoff:  Hemodialysis Schedule:  Last Hemodialysis date and time:     Restricted appendage:     R Recommendations: See Admitting Provider Note  Report given to:   Additional Notes:

## 2023-01-16 NOTE — Telephone Encounter (Signed)
   The patient's daughter Joseph [name] called the answering service after-hours today. Her father is actively being seen in the ED and states he so weak he can hardly walk.  Joseph [name] reports he has been having issues with blocked bowel and an infection requiring antibiotics but continues to have ongoing stomach problems. He is actively undergoing workup in the ED with labs showing worsening hyponatremia, AKI, transaminitis noted. Joseph [name] does not think there are any active heart issues but states "they won't keep him without a doctor to admit him" so was calling for our assistance. I told her we cannot specifically drop in and admit the patient, that the ER has a process they follow to decide and contact the inpatient team if they feel a patient needs admission. Joseph [name] [place] and has not yet talked to the ER team about plan of care. I told her I would pass along her concerns to the ER team and ask that they call her to provide an update, [phone]. I relayed this request in secure chat which ED team reviewed. Joseph [name] verbalized gratitude.  Joseph Montana, PA-C

## 2023-01-16 NOTE — ED Triage Notes (Signed)
Pt BIB EMS c/o abdominal pain, has been seen for this recently dx with small bowel obstruction on 6/16-6/20. Pt states pain still bad in upper left epigastric area that is sharp. Pt took nitroglycerin. Cardiac hx.

## 2023-01-16 NOTE — ED Notes (Signed)
Patient transported to X-ray 

## 2023-01-17 ENCOUNTER — Observation Stay (HOSPITAL_COMMUNITY): Payer: Medicare Other

## 2023-01-17 DIAGNOSIS — K5669 Other partial intestinal obstruction: Secondary | ICD-10-CM | POA: Diagnosis not present

## 2023-01-17 DIAGNOSIS — Z6827 Body mass index (BMI) 27.0-27.9, adult: Secondary | ICD-10-CM | POA: Diagnosis not present

## 2023-01-17 DIAGNOSIS — K2980 Duodenitis without bleeding: Secondary | ICD-10-CM | POA: Diagnosis present

## 2023-01-17 DIAGNOSIS — I480 Paroxysmal atrial fibrillation: Secondary | ICD-10-CM | POA: Diagnosis present

## 2023-01-17 DIAGNOSIS — E785 Hyperlipidemia, unspecified: Secondary | ICD-10-CM | POA: Diagnosis present

## 2023-01-17 DIAGNOSIS — K3189 Other diseases of stomach and duodenum: Secondary | ICD-10-CM | POA: Diagnosis not present

## 2023-01-17 DIAGNOSIS — R1013 Epigastric pain: Secondary | ICD-10-CM | POA: Diagnosis present

## 2023-01-17 DIAGNOSIS — I739 Peripheral vascular disease, unspecified: Secondary | ICD-10-CM

## 2023-01-17 DIAGNOSIS — K317 Polyp of stomach and duodenum: Secondary | ICD-10-CM | POA: Diagnosis present

## 2023-01-17 DIAGNOSIS — R1010 Upper abdominal pain, unspecified: Secondary | ICD-10-CM | POA: Diagnosis not present

## 2023-01-17 DIAGNOSIS — K760 Fatty (change of) liver, not elsewhere classified: Secondary | ICD-10-CM | POA: Diagnosis present

## 2023-01-17 DIAGNOSIS — E860 Dehydration: Secondary | ICD-10-CM | POA: Diagnosis present

## 2023-01-17 DIAGNOSIS — K222 Esophageal obstruction: Secondary | ICD-10-CM | POA: Diagnosis not present

## 2023-01-17 DIAGNOSIS — N179 Acute kidney failure, unspecified: Secondary | ICD-10-CM | POA: Diagnosis present

## 2023-01-17 DIAGNOSIS — K269 Duodenal ulcer, unspecified as acute or chronic, without hemorrhage or perforation: Secondary | ICD-10-CM | POA: Diagnosis present

## 2023-01-17 DIAGNOSIS — I7121 Aneurysm of the ascending aorta, without rupture: Secondary | ICD-10-CM | POA: Diagnosis present

## 2023-01-17 DIAGNOSIS — E1151 Type 2 diabetes mellitus with diabetic peripheral angiopathy without gangrene: Secondary | ICD-10-CM | POA: Diagnosis present

## 2023-01-17 DIAGNOSIS — Z955 Presence of coronary angioplasty implant and graft: Secondary | ICD-10-CM | POA: Diagnosis not present

## 2023-01-17 DIAGNOSIS — Z8673 Personal history of transient ischemic attack (TIA), and cerebral infarction without residual deficits: Secondary | ICD-10-CM | POA: Diagnosis not present

## 2023-01-17 DIAGNOSIS — I2510 Atherosclerotic heart disease of native coronary artery without angina pectoris: Secondary | ICD-10-CM | POA: Diagnosis present

## 2023-01-17 DIAGNOSIS — I428 Other cardiomyopathies: Secondary | ICD-10-CM | POA: Diagnosis not present

## 2023-01-17 DIAGNOSIS — E663 Overweight: Secondary | ICD-10-CM | POA: Diagnosis present

## 2023-01-17 DIAGNOSIS — I252 Old myocardial infarction: Secondary | ICD-10-CM | POA: Diagnosis not present

## 2023-01-17 DIAGNOSIS — N1831 Chronic kidney disease, stage 3a: Secondary | ICD-10-CM | POA: Diagnosis present

## 2023-01-17 DIAGNOSIS — G8929 Other chronic pain: Secondary | ICD-10-CM | POA: Diagnosis present

## 2023-01-17 DIAGNOSIS — Z1152 Encounter for screening for COVID-19: Secondary | ICD-10-CM | POA: Diagnosis not present

## 2023-01-17 DIAGNOSIS — R7989 Other specified abnormal findings of blood chemistry: Secondary | ICD-10-CM | POA: Diagnosis present

## 2023-01-17 DIAGNOSIS — E1165 Type 2 diabetes mellitus with hyperglycemia: Secondary | ICD-10-CM | POA: Diagnosis present

## 2023-01-17 DIAGNOSIS — E871 Hypo-osmolality and hyponatremia: Secondary | ICD-10-CM | POA: Diagnosis present

## 2023-01-17 DIAGNOSIS — Z794 Long term (current) use of insulin: Secondary | ICD-10-CM | POA: Diagnosis not present

## 2023-01-17 DIAGNOSIS — D132 Benign neoplasm of duodenum: Secondary | ICD-10-CM | POA: Diagnosis present

## 2023-01-17 DIAGNOSIS — E1159 Type 2 diabetes mellitus with other circulatory complications: Secondary | ICD-10-CM | POA: Diagnosis not present

## 2023-01-17 DIAGNOSIS — K449 Diaphragmatic hernia without obstruction or gangrene: Secondary | ICD-10-CM | POA: Diagnosis not present

## 2023-01-17 DIAGNOSIS — R109 Unspecified abdominal pain: Secondary | ICD-10-CM | POA: Diagnosis present

## 2023-01-17 DIAGNOSIS — R1033 Periumbilical pain: Secondary | ICD-10-CM | POA: Diagnosis not present

## 2023-01-17 DIAGNOSIS — Z8719 Personal history of other diseases of the digestive system: Secondary | ICD-10-CM | POA: Diagnosis not present

## 2023-01-17 DIAGNOSIS — E1122 Type 2 diabetes mellitus with diabetic chronic kidney disease: Secondary | ICD-10-CM | POA: Diagnosis present

## 2023-01-17 DIAGNOSIS — I7111 Aneurysm of the ascending aorta, ruptured: Secondary | ICD-10-CM | POA: Diagnosis not present

## 2023-01-17 DIAGNOSIS — I129 Hypertensive chronic kidney disease with stage 1 through stage 4 chronic kidney disease, or unspecified chronic kidney disease: Secondary | ICD-10-CM | POA: Diagnosis present

## 2023-01-17 LAB — CBC
HCT: 33.1 % — ABNORMAL LOW (ref 39.0–52.0)
Hemoglobin: 11.3 g/dL — ABNORMAL LOW (ref 13.0–17.0)
MCH: 30.8 pg (ref 26.0–34.0)
MCHC: 34.1 g/dL (ref 30.0–36.0)
MCV: 90.2 fL (ref 80.0–100.0)
Platelets: 182 10*3/uL (ref 150–400)
RBC: 3.67 MIL/uL — ABNORMAL LOW (ref 4.22–5.81)
RDW: 12.4 % (ref 11.5–15.5)
WBC: 4.3 10*3/uL (ref 4.0–10.5)
nRBC: 0 % (ref 0.0–0.2)

## 2023-01-17 LAB — COMPREHENSIVE METABOLIC PANEL
ALT: 62 U/L — ABNORMAL HIGH (ref 0–44)
AST: 48 U/L — ABNORMAL HIGH (ref 15–41)
Albumin: 2.5 g/dL — ABNORMAL LOW (ref 3.5–5.0)
Alkaline Phosphatase: 102 U/L (ref 38–126)
Anion gap: 9 (ref 5–15)
BUN: 29 mg/dL — ABNORMAL HIGH (ref 8–23)
CO2: 20 mmol/L — ABNORMAL LOW (ref 22–32)
Calcium: 8 mg/dL — ABNORMAL LOW (ref 8.9–10.3)
Chloride: 100 mmol/L (ref 98–111)
Creatinine, Ser: 1.66 mg/dL — ABNORMAL HIGH (ref 0.61–1.24)
GFR, Estimated: 40 mL/min — ABNORMAL LOW (ref >60)
Glucose, Bld: 148 mg/dL — ABNORMAL HIGH (ref 70–99)
Potassium: 4.2 mmol/L (ref 3.5–5.1)
Sodium: 129 mmol/L — ABNORMAL LOW (ref 135–145)
Total Bilirubin: 0.9 mg/dL (ref 0.3–1.2)
Total Protein: 4.8 g/dL — ABNORMAL LOW (ref 6.5–8.1)

## 2023-01-17 LAB — GLUCOSE, CAPILLARY
Glucose-Capillary: 130 mg/dL — ABNORMAL HIGH (ref 70–99)
Glucose-Capillary: 173 mg/dL — ABNORMAL HIGH (ref 70–99)
Glucose-Capillary: 233 mg/dL — ABNORMAL HIGH (ref 70–99)
Glucose-Capillary: 213 mg/dL — ABNORMAL HIGH (ref 70–99)

## 2023-01-17 MED ORDER — HYDRALAZINE HCL 20 MG/ML IJ SOLN: 10.0000 mg | INTRAMUSCULAR | Status: DC | PRN

## 2023-01-17 MED ORDER — INSULIN ASPART 100 UNIT/ML IJ SOLN
2.0000 [IU] | Freq: Three times a day (TID) | INTRAMUSCULAR | Status: DC
  Administered 2023-01-18 – 2023-01-19 (×4): 2 [IU] via SUBCUTANEOUS

## 2023-01-17 MED ORDER — FUROSEMIDE 10 MG/ML IJ SOLN
40.0000 mg | Freq: Once | INTRAMUSCULAR | Status: AC
  Administered 2023-01-17: 40 mg via INTRAVENOUS
  Filled 2023-01-17: qty 4

## 2023-01-17 MED ORDER — GUAIFENESIN 100 MG/5ML PO LIQD: 5.0000 mL | ORAL | Status: DC | PRN

## 2023-01-17 MED ORDER — IPRATROPIUM-ALBUTEROL 0.5-2.5 (3) MG/3ML IN SOLN: 3.0000 mL | RESPIRATORY_TRACT | Status: DC | PRN

## 2023-01-17 MED ORDER — SENNOSIDES-DOCUSATE SODIUM 8.6-50 MG PO TABS: 1.0000 | ORAL_TABLET | Freq: Every evening | ORAL | Status: DC | PRN

## 2023-01-17 MED ORDER — METOPROLOL TARTRATE 5 MG/5ML IV SOLN: 5.0000 mg | INTRAVENOUS | Status: DC | PRN

## 2023-01-17 MED ORDER — INSULIN ASPART 100 UNIT/ML IJ SOLN
0.0000 [IU] | Freq: Three times a day (TID) | INTRAMUSCULAR | Status: DC
  Administered 2023-01-18: 3 [IU] via SUBCUTANEOUS
  Administered 2023-01-18 – 2023-01-19 (×2): 8 [IU] via SUBCUTANEOUS
  Administered 2023-01-19: 3 [IU] via SUBCUTANEOUS
  Administered 2023-01-19: 6 [IU] via SUBCUTANEOUS
  Administered 2023-01-20 (×3): 5 [IU] via SUBCUTANEOUS
  Administered 2023-01-21: 3 [IU] via SUBCUTANEOUS
  Administered 2023-01-21: 5 [IU] via SUBCUTANEOUS

## 2023-01-17 MED ORDER — TRAZODONE HCL 50 MG PO TABS: 50.0000 mg | ORAL_TABLET | Freq: Every evening | ORAL | Status: DC | PRN

## 2023-01-17 MED ORDER — IPRATROPIUM-ALBUTEROL 0.5-2.5 (3) MG/3ML IN SOLN
3.0000 mL | Freq: Three times a day (TID) | RESPIRATORY_TRACT | Status: DC
  Administered 2023-01-17 – 2023-01-19 (×4): 3 mL via RESPIRATORY_TRACT
  Filled 2023-01-17 (×6): qty 3

## 2023-01-17 NOTE — Progress Notes (Signed)
PROGRESS NOTE    Joseph [name]  ZOX:[id number] DOB: 1940-08-02 DOA: 01/16/2023 PCP: Erskine Emery, NP   Brief Narrative:   82 y.o. male with medical history significant of CAD, stent to LAD and PCI to RCA in 2004, DES to LAD and mid and distal Circumflex, PAF on Eliquis, T2DM, HTN, HLD intolerant to statins, CKD stage III, CVA, who presented with nausea and abdominal pain.  Patient was admitted to the hospital about 2 weeks ago for small bowel obstruction treated with NG tube and conservative measures and eventually discharged home.  After going home patient started developing intermittent nausea and vomiting for 5 days prior to admission.  In between came to the ED on 6/2 7, CT abdomen pelvis showed panniculitis and was discharged home again.  Upon this admission noted to have AKI, hyponatremia with clinical signs of dehydration.  Right upper quadrant ultrasound showed hepatic steatosis, CT abdomen pelvis showed 5 mm bladder stone otherwise no acute pathology.  CTA was also negative.   Assessment & Plan:  Principal Problem:   Pain in the abdomen Active Problems:   Paroxysmal atrial fibrillation (HCC)   Essential hypertension   Type 2 diabetes mellitus with vascular disease (HCC)   CKD (chronic kidney disease), stage III (HCC)   History of CVA (cerebrovascular accident)   Hyperlipidemia   CAD S/P percutaneous coronary angioplasty   Peripheral vascular disease (HCC)   Anticoagulated   CAD in native artery   Hyponatremia   AKI (acute kidney injury) (HCC)   Elevated troponin   Overweight   Thoracic ascending aortic aneurysm (HCC)    Intermittent nausea vomiting with abdominal pain Recent small bowel obstruction History of peripheral arterial disease Transaminitis - Recent workup showed SBO but current imaging does not show evidence of small bowel obstruction.  Currently getting supportive care, IV fluids, antiemetics, diet as tolerated. Also has some heart burn type of symptoms.  Will consult GI for possible EGD -Transaminitis slowly improving.  Lipase negative.  Hyponatremia/acute kidney injury CKD stage IIIa - Baseline creatinine 1.2, admission creatinine 1.92 > 1.66  Abnormal BS -Stop IVF, Lasix 40mg  iv once. Nebs sch and prn. IS/flutter. Out of bed to chair.   Paroxysmal atrial fibrillation on Eliquis History of coronary artery disease status post stent - Continue Eliquis.  Continue Coreg, Imdur, Plavix  Diabetes mellitus type 2 - A1c 7.5.  Sliding scale and Accu-Cheks  Essential hypertension - Coreg, Imdur.  IV as needed  Hyperlipidemia - Intolerant to statins  History of CVA - On Eliquis   DVT prophylaxis: Eliquis Code Status: Full  Family Communication:  Daughter at bedside Still has abdnormal BS, and has GI symptoms       Diet Orders (From admission, onward)     Start     Ordered   01/16/23 1346  Diet clear liquid Room service appropriate? Yes; Fluid consistency: Thin  Diet effective now       Comments: Diabetic diet  Question Answer Comment  Room service appropriate? Yes   Fluid consistency: Thin      01/16/23 1345            Subjective: Feels ok  But has exertional SOB and cough   Examination:  General exam: Appears calm and comfortable  Respiratory system: b/l rhonchi Cardiovascular system: S1 & S2 heard, RRR. No JVD, murmurs, rubs, gallops or clicks. No pedal edema. Gastrointestinal system: Abdomen is nondistended, soft and nontender. No organomegaly or masses felt. Normal bowel sounds heard. Central nervous  system: Alert and oriented. No focal neurological deficits. Extremities: Symmetric 5 x 5 power. Skin: No rashes, lesions or ulcers Psychiatry: Judgement and insight appear normal. Mood & affect appropriate.  Objective: Vitals:   01/16/23 1546 01/16/23 1805 01/16/23 1916 01/17/23 0535  BP:  134/64 130/60 131/63  Pulse:  93 70 (!) 52  Resp:  18 17 18   Temp:  98.6 F (37 C) 98.6 F (37 C) 98.8 F (37.1  C)  TempSrc: Oral Oral Oral Oral  SpO2:  95% 92% 90%  Weight:      Height:        Intake/Output Summary (Last 24 hours) at 01/17/2023 0858 Last data filed at 01/17/2023 0653 Gross per 24 hour  Intake 920.61 ml  Output 450 ml  Net 470.61 ml   Filed Weights   01/16/23 0550 01/16/23 1305  Weight: 97.1 kg 97.5 kg    Scheduled Meds:  apixaban  5 mg Oral BID   carvedilol  6.25 mg Oral BID WC   clopidogrel  75 mg Oral Daily   isosorbide mononitrate  30 mg Oral QPM   pantoprazole  40 mg Oral Daily   Continuous Infusions:  sodium chloride 75 mL/hr at 01/16/23 2122    Nutritional status     Body mass index is 27.6 kg/m.  Data Reviewed:   CBC: Recent Labs  Lab 01/13/23 1400 01/16/23 0555 01/17/23 0137  WBC 15.4* 6.2 4.3  NEUTROABS 14.8* 5.5  --   HGB 13.8 13.1 11.3*  HCT 40.1 38.0* 33.1*  MCV 89.5 89.8 90.2  PLT 251 209 182   Basic Metabolic Panel: Recent Labs  Lab 01/13/23 1400 01/16/23 0555 01/17/23 0137  NA 133* 128* 129*  K 4.3 4.1 4.2  CL 102 96* 100  CO2 21* 21* 20*  GLUCOSE 165* 158* 148*  BUN 23 39* 29*  CREATININE 1.28* 1.92* 1.66*  CALCIUM 9.3 8.5* 8.0*  MG  --  2.1  --    GFR: Estimated Creatinine Clearance: 38.5 mL/min (A) (by C-G formula based on SCr of 1.66 mg/dL (H)). Liver Function Tests: Recent Labs  Lab 01/13/23 1400 01/16/23 0555 01/17/23 0137  AST 19 88* 48*  ALT 24 88* 62*  ALKPHOS 53 121 102  BILITOT 1.2 1.1 0.9  PROT 6.0* 5.7* 4.8*  ALBUMIN 3.6 3.1* 2.5*   Recent Labs  Lab 01/13/23 1400 01/16/23 0555  LIPASE 27 40   No results for input(s): "AMMONIA" in the last 168 hours. Coagulation Profile: No results for input(s): "INR", "PROTIME" in the last 168 hours. Cardiac Enzymes: No results for input(s): "CKTOTAL", "CKMB", "CKMBINDEX", "TROPONINI" in the last 168 hours. BNP (last 3 results) No results for input(s): "PROBNP" in the last 8760 hours. HbA1C: No results for input(s): "HGBA1C" in the last 72  hours. CBG: Recent Labs  Lab 01/16/23 1651 01/16/23 2059 01/17/23 0748  GLUCAP 167* 155* 130*   Lipid Profile: No results for input(s): "CHOL", "HDL", "LDLCALC", "TRIG", "CHOLHDL", "LDLDIRECT" in the last 72 hours. Thyroid Function Tests: No results for input(s): "TSH", "T4TOTAL", "FREET4", "T3FREE", "THYROIDAB" in the last 72 hours. Anemia Panel: No results for input(s): "VITAMINB12", "FOLATE", "FERRITIN", "TIBC", "IRON", "RETICCTPCT" in the last 72 hours. Sepsis Labs: Recent Labs  Lab 01/16/23 1353 01/16/23 1705 01/16/23 2028  LATICACIDVEN 1.6 2.4* 1.2    Recent Results (from the past 240 hour(s))  SARS Coronavirus 2 by RT PCR (hospital order, performed in [facility] hospital lab) *cepheid single result test* Anterior Nasal Swab  Status: None   Collection Time: 01/16/23 12:02 PM   Specimen: Anterior Nasal Swab  Result Value Ref Range Status   SARS Coronavirus 2 by RT PCR NEGATIVE NEGATIVE Final    Comment: Performed at [facility], [address], [place], [place] [address]         Radiology Studies: CT ABDOMEN PELVIS W CONTRAST  Addendum Date: 01/16/2023   ADDENDUM REPORT: 01/16/2023 09:50 ADDENDUM: The 1st impression contains a typographical error, and should read NO evidence of appendicitis or other acute findings. Electronically Signed   By: Danae Orleans M.D.   On: 01/16/2023 09:50   Result Date: 01/16/2023 CLINICAL DATA:  Right lower quadrant abdominal pain. Prior prostatectomy for prostate carcinoma. * Tracking Code: BO * EXAM: CT ABDOMEN AND PELVIS WITH CONTRAST TECHNIQUE: Multidetector CT imaging of the abdomen and pelvis was performed using the standard protocol following bolus administration of intravenous contrast. RADIATION DOSE REDUCTION: This exam was performed according to the departmental dose-optimization program which includes automated exposure control, adjustment of the mA and/or kV according to patient size and/or use of iterative  reconstruction technique. CONTRAST:  60mL OMNIPAQUE IOHEXOL 350 MG/ML SOLN COMPARISON:  01/13/2023 FINDINGS: Lower Chest: Dependent bibasilar atelectasis, similar to prior exam. Hepatobiliary: No suspicious hepatic masses identified. Layering sludge versus tiny gallstones 1 again seen, without evidence of cholecystitis or biliary ductal dilatation. Pancreas:  No mass or inflammatory changes. Spleen: Within normal limits in size and appearance. Adrenals/Urinary Tract: No suspicious masses identified. Multiple small less than 1 cm bilateral renal calculi are seen. No evidence of ureteral calculi or hydronephrosis, however a 5 mm calculus is again seen in the urinary bladder near the left UVJ. Stomach/Bowel: No evidence of obstruction, inflammatory process or abnormal fluid collections. Diverticulosis is seen mainly involving the sigmoid colon, however there is no evidence of diverticulitis. Although the appendix is not directly visualized, no inflammatory process seen in region of the cecum or elsewhere. Vascular/Lymphatic: No pathologically enlarged lymph nodes. No acute vascular findings. Aortic atherosclerotic calcification incidentally noted. Reproductive: Prior prostatectomy. No mass or other significant abnormality identified. Other:  None. Musculoskeletal: No suspicious bone lesions identified. Lower lumbar spine fusion hardware again noted. IMPRESSION: Evidence of appendicitis or other acute findings. Bilateral nephrolithiasis. Unchanged 5 mm calculus in urinary bladder near the left UVJ. No evidence of hydroureteronephrosis. Colonic diverticulosis, without radiographic evidence of diverticulitis. Layering sludge versus tiny gallstones, without radiographic evidence of cholecystitis. Prior prostatectomy. No evidence of abdominal or pelvic metastatic disease. Aortic Atherosclerosis (ICD10-I70.0). Electronically Signed: By: Danae Orleans M.D. On: 01/16/2023 09:41   CT Angio Chest PE W and/or Wo  Contrast  Result Date: 01/16/2023 CLINICAL DATA:  Chest pain. Elevated D-dimer. Intermediate probability for pulmonary embolism. EXAM: CT ANGIOGRAPHY CHEST WITH CONTRAST TECHNIQUE: Multidetector CT imaging of the chest was performed using the standard protocol during bolus administration of intravenous contrast. Multiplanar CT image reconstructions and MIPs were obtained to evaluate the vascular anatomy. RADIATION DOSE REDUCTION: This exam was performed according to the departmental dose-optimization program which includes automated exposure control, adjustment of the mA and/or kV according to patient size and/or use of iterative reconstruction technique. CONTRAST:  60mL OMNIPAQUE IOHEXOL 350 MG/ML SOLN COMPARISON:  05/17/2019 FINDINGS: Cardiovascular: Satisfactory opacification of pulmonary arteries noted, and no pulmonary emboli identified. No evidence of thoracic aortic dissection. 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 Aortic and coronary atherosclerotic calcification incidentally noted. Mediastinum/Nodes: No masses or pathologically enlarged lymph nodes identified. Lungs/Pleura: Increased dependent atelectasis both lower  lobes. No evidence of pulmonary consolidation mass, or pleural effusion. Upper abdomen: No acute findings. Musculoskeletal: No suspicious bone lesions identified. Review of the MIP images confirms the above findings. IMPRESSION: No evidence of pulmonary embolism. Increased dependent atelectasis in both lower lobes. 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 Recommend annual imaging followup by CTA or MRA. This recommendation follows 2010 ACCF/AHA/AATS/ACR/ASA/SCA/SCAI/SIR/STS/SVM Guidelines for the Diagnosis and Management of Patients with Thoracic Aortic Disease. Circulation. 2010; 121: N562-Z308. Aortic aneurysm NOS (ICD10-I71.9) Aortic Atherosclerosis (ICD10-I70.0). Electronically Signed   By: Danae Orleans M.D.   On: 01/16/2023 09:33   US Abdomen Limited RUQ  (LIVER/GB)  Result Date: 01/16/2023 CLINICAL DATA:  82 year old male with history of transaminitis. EXAM: ULTRASOUND ABDOMEN LIMITED RIGHT UPPER QUADRANT COMPARISON:  Abdominal ultrasound 01/13/2023. FINDINGS: Gallbladder: No gallstones or wall thickening visualized. No sonographic Murphy sign noted by sonographer. Common bile duct: Diameter: 3.6 mm Liver: No focal lesion identified. Diffusely increased hepatic echogenicity. Portal vein is patent on color Doppler imaging with normal direction of blood flow towards the liver. Other: None. IMPRESSION: 1. No acute findings. Specifically, no gallstones or findings to suggest an acute cholecystitis. 2. Diffusely increased hepatic echogenicity suggestive of hepatic steatosis. Electronically Signed   By: Trudie Reed M.D.   On: 01/16/2023 08:18   DG Abdomen 1 View  Result Date: 01/16/2023 CLINICAL DATA:  Abdominal pain. Recent small bowel obstruction 01/02/2023. EXAM: ABDOMEN - 1 VIEW COMPARISON:  01/05/2023 FINDINGS: Air is present throughout the colon. There are a few air-filled nondilated small bowel loops present. No free peritoneal air. Remainder of the exam is unchanged. IMPRESSION: Nonspecific, nonobstructive bowel gas pattern. Electronically Signed   By: Elberta Fortis M.D.   On: 01/16/2023 08:12   DG Chest 2 View  Result Date: 01/16/2023 CLINICAL DATA:  Chest wall pain and abdominal pain. Recent small bowel obstruction 01/02/2023. EXAM: CHEST - 2 VIEW COMPARISON:  01/13/2023 FINDINGS: Lungs are adequately inflated with mild stable left base opacification likely atelectasis and less likely infection. No significant effusion. Cardiomediastinal silhouette and remainder of the exam is unchanged. IMPRESSION: Mild left base opacification likely atelectasis and less likely infection. Electronically Signed   By: Elberta Fortis M.D.   On: 01/16/2023 08:10           LOS: 0 days   Time spent= 35 mins    Carmeron Heady Joline Maxcy, MD [facility]  If 7PM-7AM, please contact night-coverage  01/17/2023, 8:58 AM

## 2023-01-17 NOTE — TOC CM/SW Note (Signed)
Transition of Care [facility]) - Inpatient Brief Assessment   Patient Details  Name: Joseph [name] MRN: [id number] Date of Birth: [birth year]  Transition of Care [facility]) CM/SW Contact:    Tom-Johnson, Hershal Coria, RN Phone Number: 01/17/2023, 3:02 PM   Clinical Narrative:  Patient admitted with Nausea and abdominal pains. Recently admitted for SBO, treated conservatively and discharged home. On Eliquis for A-fib.   From home alone, has two supportive children. Independent with care till last two weeks prior to hospitalization. PCP is Erskine Emery, NP and uses [facility] in [place].   CM will continue to follow as patient progresses with care towards discharge.   Transition of Care Asessment: Insurance and Status: Insurance coverage has been reviewed Patient has primary care physician: Yes Home environment has been reviewed: Yes Prior level of function:: Independent Prior/Current Home Services: No current home services Social Determinants of Health Reivew: SDOH reviewed no interventions necessary Readmission risk has been reviewed: Yes Transition of care needs: transition of care needs identified, TOC will continue to follow

## 2023-01-17 NOTE — Anesthesia Preprocedure Evaluation (Signed)
Anesthesia Evaluation  Patient identified by MRN, date of birth, ID band Patient awake    Reviewed: Allergy & Precautions, NPO status , Patient's Chart, lab work & pertinent test results  History of Anesthesia Complications Negative for: history of anesthetic complications  Airway Mallampati: II  TM Distance: >3 FB Neck ROM: Full    Dental  (+) Dental Advisory Given, Poor Dentition,    Pulmonary neg pulmonary ROS   breath sounds clear to auscultation       Cardiovascular hypertension, Pt. on home beta blockers and Pt. on medications + angina (last used nitroglycerin 3 days ago)  + CAD, + Past MI (2004, 2016), + Cardiac Stents and + Peripheral Vascular Disease  + dysrhythmias Atrial Fibrillation  Rhythm:Regular Rate:Normal  HLD, thoracic ascending aortic aneurysm  Low-risk stress test 12/30/2022  TTE 10/28/2020: IMPRESSIONS    1. Left ventricular ejection fraction, by estimation, is 60 to 65%. The  left ventricle has normal function. The left ventricle has no regional  wall motion abnormalities. Left ventricular diastolic parameters are  indeterminate.   2. Right ventricular systolic function is normal. The right ventricular  size is normal.   3. The mitral valve is grossly normal. Trivial mitral valve  regurgitation.   4. The aortic valve is tricuspid. Aortic valve regurgitation is not  visualized. No aortic stenosis is present.     Neuro/Psych neg Seizures CVA (incidentally seenon head CT), No Residual Symptoms    GI/Hepatic Neg liver ROS,GERD  Medicated,,  Endo/Other  diabetes, Type 2    Renal/GU CRFRenal disease     Musculoskeletal  (+) Arthritis ,    Abdominal   Peds  Hematology negative hematology ROS (+)   Anesthesia Other Findings 82 yo male with recent partial SBO which resolved with conservative measures. Now readmitted with recurrent nausea and generalized upper abdominal pain (> LUQ). CT scan  negative for recurrent obstruction or other acute findings. Etiology unclear.  Last Eliquis and Plavix yesterday.   Reproductive/Obstetrics                             Anesthesia Physical Anesthesia Plan  ASA: 3  Anesthesia Plan: MAC   Post-op Pain Management: Minimal or no pain anticipated   Induction: Intravenous  PONV Risk Score and Plan: 1 and Propofol infusion and Treatment may vary due to age or medical condition  Airway Management Planned: Natural Airway and Nasal Cannula  Additional Equipment:   Intra-op Plan:   Post-operative Plan:   Informed Consent: I have reviewed the patients History and Physical, chart, labs and discussed the procedure including the risks, benefits and alternatives for the proposed anesthesia with the patient or authorized representative who has indicated his/her understanding and acceptance.     Dental advisory given  Plan Discussed with: CRNA and Anesthesiologist  Anesthesia Plan Comments: (Discussed with patient risks of MAC including, but not limited to, minor pain or discomfort, hearing people in the room, and possible need for backup general anesthesia. Risks for general anesthesia also discussed including, but not limited to, sore throat, hoarse voice, chipped/damaged teeth, injury to vocal cords, nausea and vomiting, allergic reactions, lung infection, heart attack, stroke, and death. All questions answered. )       Anesthesia Quick Evaluation

## 2023-01-17 NOTE — Discharge Instructions (Signed)

## 2023-01-17 NOTE — Progress Notes (Signed)
Initial visit with Joseph [name] and his daughter, Joseph [name]. Chaplain asked open ended questions to facilitate emotional expression and story telling. Joseph [name] has been experiencing significant abdominal pain for the last 3 weeks. He was hospitalized, discharged, and readmitted and has had minimal resolution to his pain. He is scheduled for an endoscopy tomorrow. Chaplain utilized reflective listening to help identify strengths and sources of concern. Joseph [name] describes himself as someone who is determined and that is where he identifies his strength in continuing on with so much uncertainty. Chaplain provided prayer at the patient's request and in his spiritual tradition.  Please page as further needs arise.  Maryanna Shape. Carley Hammed, M.Div. [facility] Chaplain Pager [phone] Office [phone]      01/17/23 1658  Spiritual Encounters  Type of Visit Initial  Care provided to: Patient;Family  Referral source Nurse (RN/NT/LPN)  Reason for visit Routine spiritual support  Spiritual Framework  Presenting Themes Goals in life/care;Values and beliefs;Coping tools  Community/Connection Family  Patient Stress Factors Health changes;Lack of knowledge  Goals  Clinical Care Goals Looking for resolution to his pain  Interventions  Spiritual Care Interventions Made Compassionate presence;Reflective listening;Established relationship of care and support;Normalization of emotions;Explored values/beliefs/practices/strengths;Prayer  Intervention Outcomes  Outcomes Connection to spiritual care;Awareness around self/spiritual resourses;Connection to values and goals of care  Spiritual Care Plan  Spiritual Care Issues Still Outstanding Referring to oncoming chaplain for further support

## 2023-01-17 NOTE — H&P (View-Only) (Signed)
Consultation Note   Referring Provider:  [facility] PCP: Erskine Emery, NP Primary Gastroenterologist: Gentry Fitz. Previously Dr. Charm Barges.  Reason for consultation: abdominal pain / GERD symptoms  DOA: 01/16/2023         Hospital Day: 2   Assessment and Plan   82 yo male with recent partial SBO which resolved with conservative measures. Now readmitted with recurrent nausea and generalized upper abdominal pain (> LUQ). CT scan negative for recurrent obstruction or other acute findings. Etiology unclear. PUD? Biliary pain? Pain not consistently worse with PO intake so ischemia seems unlikely -Offered EGD on Plavix and Eliquis to be done tomorrow.  The risks and benefits of EGD with possible biopsies were discussed with the patient who agrees to proceed.  -NPO after MN -Continue daily PPI -If EGD negative and pain persists consider further gallbladder evaluation  Cholelithiasis vr gallbladder sludge on CT scan and [place]   CAD / history of stent placement. On Plavix   PAF on Eliquis  See PMH for additional medical history   History of Present Illness Patient is a 82 y.o. year old male whose past medical history includes but is not necessarily limited to CAD / stenting, PAF on Eliquis, CKd 3, kidney stones, CVA, DM,  diverticulosis, partial small bowel obstruction, colon polyps, post-polypectomy bleed  Patient was admitted mid June for SBO which resolved with supportive care. He presented to ED again on 6/27 with abdominal pain. CT scan suggested  panniculitis. He was discharged home and returned to ED yesterday with recurrent generalized upper abdominal pain ( > LUQ) and nausea without vomiting. Pain is described as burning. It is nearly constant, possibly gets worse with PO intake but hard to say that it gets better with defecation. Pain has no relationship to physical activity /  position.  Prior to recent partial SBO he hasn't had pain  like this before. He has chronic back pain so he is unsure if pain radiates through to his back. No NSAID use at home. He doesn't feel constipated.   WBC is normal. Hgb down from 13 to 11.3 without overt GI bleeding ( ? 2/2 IVF). Normal lipase. New mild elevation in liver enzymes  (2x ULN) on admission, improved overnight. . Afebrile.   CT scan + for small bilateral renal stones a 5mm bladder stones and layering sludge vrs gallstones without evidence for cholecystitis. No acute findings.    Labs and Imaging: Recent Labs    01/16/23 0555 01/17/23 0137  WBC 6.2 4.3  HGB 13.1 11.3*  HCT 38.0* 33.1*  PLT 209 182   Recent Labs    01/16/23 0555 01/17/23 0137  NA 128* 129*  K 4.1 4.2  CL 96* 100  CO2 21* 20*  GLUCOSE 158* 148*  BUN 39* 29*  CREATININE 1.92* 1.66*  CALCIUM 8.5* 8.0*   Recent Labs    01/17/23 0137  PROT 4.8*  ALBUMIN 2.5*  AST 48*  ALT 62*  ALKPHOS 102  BILITOT 0.9   No results for input(s): "HEPBSAG", "HCVAB", "HEPAIGM", "HEPBIGM" in the last 72 hours. No results for input(s): "LABPROT", "INR" in the last 72 hours.  Previous GI Evaluation:   June 2019  Unprepped to assess for post-polypectomy bleed. Procedure at  outside facility - Preparation of the colon was unsatisfactory. - Blood in the entire examined colon as above, residual stool present as well. - A single ulcer in the ascending colon from prior polypectomy site. I think less likely the cause for bleeding but one clip was placed to close the lesion. - A single ulcer with adherent clot / possible vessel in the distal transverse / proximal descending colon in an area with residual blood. It's possible this lesion could account for bleeding. 2 clips were placed. - Diverticulosis in the left colon. Unable to clear the colon completely due to residual stool, some areas of the sigmoid not well visualized. - Internal hemorrhoids. - The examination was otherwise normal.   Principal Problem:   Pain in the  abdomen Active Problems:   Type 2 diabetes mellitus with vascular disease (HCC)   Essential hypertension   CAD S/P percutaneous coronary angioplasty   Peripheral vascular disease (HCC)   History of CVA (cerebrovascular accident)   Paroxysmal atrial fibrillation (HCC)   Anticoagulated   Hyperlipidemia   CAD in native artery   CKD (chronic kidney disease), stage III (HCC)   Hyponatremia   AKI (acute kidney injury) (HCC)   Elevated troponin   Overweight   Thoracic ascending aortic aneurysm (HCC)   Abdominal pain     Past Medical History:  Diagnosis Date   Abdominal discomfort    Abdominal discomfort: EGD 2011 showed no significant abnormalities.  Possibly due to metformin.   Allergic rhinitis    Anticoagulated 05/30/2016   CAD (coronary artery disease)    a. s/p MI in 2004:  stents to the LAD and staged stent RCA, EF 55%;  b.  NSTEMI (1/16):  LHC - mid LAD stent with diff dist restenosis, prox to mid Dx mod dsz jailed by stent, small OM1 99, mCFX 75, dCFX 80, mRCA stent ok, mPDA mild to mod dsz, EF 55% >> PCI:  Synergy DES to LAD; Synergy DES (x2) mid and dist CFX   CAD in native artery 06/12/2019   Chronic kidney disease, stage 2, mildly decreased GFR    stage 2-3   DDD (degenerative disc disease)    L4 to S1 degenerative disease and spondylolisthesis: low back pain.  s/p back surgery   DM (diabetes mellitus) (HCC)    History of CVA (cerebrovascular accident)    Small CVA seen by head CT (incidental) in 1/13.  Carotid dopplers in 1/13 showed minimal disease   History of Doppler ultrasound    Arterial dopplers (3/11): no evidence for significant PAD. ABIs 10/12: Normal.     HLD (hyperlipidemia)    HTN (hypertension)    had side effects with chlorthalidone   Hx of cardiovascular stress test    Myoview (1/13): Small fixed apical septal defect with no ischemia, EF 58%.   Hx of echocardiogram    Echo (3/11): EF 60%, normal wall motion, mild MR, mild left atrial enlargement,  mildly dilated ascending aorta.   PAF (paroxysmal atrial fibrillation) (HCC) 05/29/2016   Spondylolisthesis    L4-S1   Statin intolerance     Past Surgical History:  Procedure Laterality Date   COLONOSCOPY N/A 12/26/2017   Procedure: COLONOSCOPY;  Surgeon: Benancio Deeds, MD;  Location: [facility] ENDOSCOPY;  Service: Gastroenterology;  Laterality: N/A;   COLONOSCOPY W/ POLYPECTOMY  12/2017   CORONARY ANGIOGRAM  11/13/2014   Procedure: CORONARY ANGIOGRAM;  Surgeon: Laurey Morale, MD;  Location: [facility] CATH LAB;  Service: Cardiovascular;;   CORONARY STENT INTERVENTION  N/A 05/18/2019   Procedure: CORONARY STENT INTERVENTION;  Surgeon: Marykay Lex, MD;  Location: [facility] INVASIVE CV LAB;  Service: Cardiovascular;  Laterality: N/A;   CORONARY STENT PLACEMENT  2004   x2 LAD and RCA    LEFT HEART CATH AND CORONARY ANGIOGRAPHY N/A 05/18/2019   Procedure: LEFT HEART CATH AND CORONARY ANGIOGRAPHY;  Surgeon: Marykay Lex, MD;  Location: [facility] INVASIVE CV LAB;  Service: Cardiovascular;  Laterality: N/A;   LEFT HEART CATH AND CORONARY ANGIOGRAPHY N/A 06/11/2019   Procedure: LEFT HEART CATH AND CORONARY ANGIOGRAPHY;  Surgeon: Yvonne Kendall, MD;  Location: [facility] INVASIVE CV LAB;  Service: Cardiovascular;  Laterality: N/A;   LEFT HEART CATH AND CORONARY ANGIOGRAPHY N/A 10/27/2020   Procedure: LEFT HEART CATH AND CORONARY ANGIOGRAPHY;  Surgeon: Lennette Bihari, MD;  Location: [facility] INVASIVE CV LAB;  Service: Cardiovascular;  Laterality: N/A;   LEFT HEART CATHETERIZATION WITH CORONARY ANGIOGRAM N/A 08/02/2014   Procedure: LEFT HEART CATHETERIZATION WITH CORONARY ANGIOGRAM;  Surgeon: Corky Crafts, MD;  Location: [facility] CATH LAB;  Service: Cardiovascular;  Laterality: N/A;   PROSTATE SURGERY     SPINAL FUSION     L4-S1    Family History  Problem Relation Age of Onset   Heart attack Mother    Heart attack Father    Stroke Neg Hx     Prior to Admission medications   Medication Sig Start Date End Date  Taking? Authorizing Provider  acetaminophen (TYLENOL) 650 MG CR tablet Take 1 tablet (650 mg total) by mouth every 8 (eight) hours as needed for pain. 01/06/23  Yes Rolly Salter, MD  apixaban (ELIQUIS) 5 MG TABS tablet Take 1 tablet (5 mg total) by mouth 2 (two) times daily. 12/09/22  Yes Nahser, Deloris Ping, MD  carvedilol (COREG) 6.25 MG tablet TAKE 1 TABLET BY MOUTH TWICE A DAY Patient taking differently: Take 6.25 mg by mouth 2 (two) times daily with a meal. 06/29/22  Yes Nahser, Deloris Ping, MD  Cholecalciferol (VITAMIN D3) 25 MCG (1000 UT) CAPS Take 1,000 Units by mouth daily.   Yes [provider]  clopidogrel (PLAVIX) 75 MG tablet TAKE 1 TABLET BY MOUTH EVERY DAY Patient taking differently: Take 75 mg by mouth daily. 08/11/22  Yes Nahser, Deloris Ping, MD  diltiazem (CARDIZEM) 30 MG tablet Take 1 tablet (30 mg total) by mouth as needed (Rapid Palpitations). 01/31/18  Yes Weaver, Scott T, PA-C  fish oil-omega-3 fatty acids 1000 MG capsule Take 1 g by mouth at bedtime.   Yes [provider]  isosorbide mononitrate (IMDUR) 30 MG 24 hr tablet Take 30 mg by mouth every evening.   Yes [provider]  loperamide (IMODIUM) 2 MG capsule Take 2 mg by mouth as needed for diarrhea or loose stools.   Yes [provider]  nitroGLYCERIN (NITROSTAT) 0.4 MG SL tablet PLACE 1 TAB UNDER THE TONGUE EVERY 5 MINS AS NEEDED FOR CHEST PAIN FOR UP TO 3 DOSES Patient taking differently: Place 0.4 mg under the tongue every 5 (five) minutes as needed for chest pain. 05/07/22  Yes Nahser, Deloris Ping, MD  ondansetron (ZOFRAN) 4 MG tablet Take 1 tablet (4 mg total) by mouth every 8 (eight) hours as needed for nausea or vomiting. 01/06/23 01/06/24 Yes Rolly Salter, MD  oxymetazoline (AFRIN) 0.05 % nasal spray Place 2 sprays into both nostrils at bedtime.   Yes [provider]  pantoprazole (PROTONIX) 40 MG tablet Take 1 tablet (40 mg total) by mouth  daily. 01/06/23  Yes Rolly Salter, MD   polyethylene glycol (MIRALAX) 17 g packet Take 17 g by mouth daily. Patient taking differently: Take 17 g by mouth daily as needed for mild constipation. 01/06/23  Yes Rolly Salter, MD  sulfamethoxazole-trimethoprim (BACTRIM DS) 800-160 MG tablet Take 1 tablet by mouth 2 (two) times daily. 01/12/23  Yes [provider]  telmisartan (MICARDIS) 20 MG tablet Take 1 tablet (20 mg total) by mouth daily. 01/06/23  Yes Rolly Salter, MD  TRESIBA FLEXTOUCH 100 UNIT/ML FlexTouch Pen Inject 10 Units into the skin daily. Patient taking differently: Inject 40-44 Units into the skin daily. Adjust based on blood sugar. 01/06/23  Yes Rolly Salter, MD  Zinc Acetate, Oral, (ZINC ACETATE PO) Take 1 tablet by mouth daily.   Yes [provider]  doxycycline (VIBRA-TABS) 100 MG tablet Take 100 mg by mouth 2 (two) times daily. Patient not taking: Reported on 01/16/2023 01/08/23   [provider]  ondansetron (ZOFRAN-ODT) 8 MG disintegrating tablet Take 1 tablet (8 mg total) by mouth every 8 (eight) hours as needed for nausea or vomiting. Patient not taking: Reported on 01/16/2023 01/13/23   Linwood Dibbles, MD  simethicone (MYLICON) 80 MG chewable tablet Chew 1 tablet (80 mg total) by mouth 4 (four) times daily. Patient not taking: Reported on 01/16/2023 01/06/23   Rolly Salter, MD    Current Facility-Administered Medications  Medication Dose Route Frequency Provider Last Rate Last Admin   acetaminophen (TYLENOL) tablet 650 mg  650 mg Oral Q6H PRN Dede Query, MD       Or   acetaminophen (TYLENOL) suppository 650 mg  650 mg Rectal Q6H PRN Dede Query, MD       apixaban (ELIQUIS) tablet 5 mg  5 mg Oral BID Dierdre Searles, Na, MD   5 mg at 01/17/23 0939   carvedilol (COREG) tablet 6.25 mg  6.25 mg Oral BID WC Dierdre Searles, Na, MD   6.25 mg at 01/17/23 1610   clopidogrel (PLAVIX) tablet 75 mg  75 mg Oral Daily Dierdre Searles, Na, MD   75 mg at 01/17/23 0939   guaiFENesin (ROBITUSSIN) 100 MG/5ML liquid 5 mL  5 mL Oral Q4H PRN Amin, Ankit  Chirag, MD       hydrALAZINE (APRESOLINE) injection 10 mg  10 mg Intravenous Q4H PRN Amin, Ankit Chirag, MD       ipratropium-albuterol (DUONEB) 0.5-2.5 (3) MG/3ML nebulizer solution 3 mL  3 mL Nebulization Q4H PRN Amin, Ankit Chirag, MD       ipratropium-albuterol (DUONEB) 0.5-2.5 (3) MG/3ML nebulizer solution 3 mL  3 mL Nebulization TID Amin, Ankit Chirag, MD   3 mL at 01/17/23 1404   isosorbide mononitrate (IMDUR) 24 hr tablet 30 mg  30 mg Oral QPM Li, Na, MD   30 mg at 01/16/23 1814   metoprolol tartrate (LOPRESSOR) injection 5 mg  5 mg Intravenous Q4H PRN Amin, Ankit Chirag, MD       nitroGLYCERIN (NITROSTAT) SL tablet 0.4 mg  0.4 mg Sublingual Q5 min PRN Dierdre Searles, Na, MD       ondansetron (ZOFRAN) tablet 4 mg  4 mg Oral Q6H PRN Dierdre Searles, Na, MD       Or   ondansetron (ZOFRAN) injection 4 mg  4 mg Intravenous Q6H PRN Dierdre Searles, Na, MD       pantoprazole (PROTONIX) EC tablet 40 mg  40 mg Oral Daily Li, Na, MD   40 mg at 01/17/23 0939   polyethylene glycol (  MIRALAX / GLYCOLAX) packet 17 g  17 g Oral Daily PRN Dierdre Searles, Na, MD       senna-docusate (Senokot-S) tablet 1 tablet  1 tablet Oral QHS PRN Amin, Ankit Chirag, MD       traZODone (DESYREL) tablet 50 mg  50 mg Oral QHS PRN Dimple Nanas, MD        Allergies as of 01/16/2023 - Review Complete 01/16/2023  Allergen Reaction Noted   Bempedoic acid Other (See Comments) 03/21/2019   [name] [alirocumab] Other (See Comments) 07/24/2018   Rosuvastatin Other (See Comments) 04/06/2017   Zetia [ezetimibe] Other (See Comments) 08/02/2017   Hydralazine Other (See Comments) 06/01/2021   Keflex [cephalexin] Other (See Comments) 10/27/2020    Social History   Socioeconomic History   Marital status: Divorced    Spouse name: Not on file   Number of children: 2   Years of education: Not on file   Highest education level: Not on file  Occupational History   Occupation: retired  Tobacco Use   Smoking status: Never   Smokeless tobacco: Never  Vaping Use    Vaping Use: Never used  Substance and Sexual Activity   Alcohol use: No   Drug use: No   Sexual activity: Not Currently  Other Topics Concern   Not on file  Social History Narrative   Not on file   Social Determinants of Health   Financial Resource Strain: Not on file  Food Insecurity: No Food Insecurity (01/16/2023)   Hunger Vital Sign    Worried About Running Out of Food in the Last Year: Never true    Ran Out of Food in the Last Year: Never true  Transportation Needs: No Transportation Needs (01/16/2023)   PRAPARE - Administrator, Civil Service (Medical): No    Lack of Transportation (Non-Medical): No  Physical Activity: Not on file  Stress: Not on file  Social Connections: Not on file  Intimate Partner Violence: Not At Risk (01/16/2023)   Humiliation, Afraid, Rape, and Kick questionnaire    Fear of Current or Ex-Partner: No    Emotionally Abused: No    Physically Abused: No    Sexually Abused: No     Code Status   Code Status: Full Code  Review of Systems: All systems reviewed and negative except where noted in HPI.  Physical Exam: Vital signs in last 24 hours: Temp:  [98.3 F (36.8 C)-98.8 F (37.1 C)] 98.3 F (36.8 C) (07/01 0923) Pulse Rate:  [52-93] 67 (07/01 1025) Resp:  [17-18] 18 (07/01 0923) BP: (130-137)/(60-66) 137/66 (07/01 0923) SpO2:  [90 %-95 %] 95 % (07/01 1025) Last BM Date : 01/15/23  General:  Pleasant male in NAD Psych:  Cooperative. Normal mood and affect Eyes: Pupils equal Ears:  Normal auditory acuity Nose: No deformity, discharge or lesions Neck:  Supple, no masses felt Lungs:  Clear to auscultation.  Heart:  Regular rate Abdomen:  Soft, nondistended, nontender, active bowel sounds, no masses felt Rectal :  Deferred Msk: Symmetrical without gross deformities.  Neurologic:  Alert, oriented, grossly normal neurologically Extremities : No edema Skin:  Intact without significant lesions.    Intake/Output from previous  day: 06/30 0701 - 07/01 0700 In: 920.6 [I.V.:920.6] Out: 450 [Urine:450] Intake/Output this shift:  Total I/O In: 885.6 [P.O.:620; I.V.:265.6] Out: 1180 [Urine:1180]     Willette Cluster, NP-C @  01/17/2023, 2:41 PM

## 2023-01-17 NOTE — Consult Note (Signed)
                                             Consultation Note   Referring Provider:  [facility] PCP: Hodge, Regina F, NP Primary Gastroenterologist: Unassigned. Previously Dr. Butler.  Reason for consultation: abdominal pain / GERD symptoms  DOA: 01/16/2023         Hospital Day: 2   Assessment and Plan   82 yo male with recent partial SBO which resolved with conservative measures. Now readmitted with recurrent nausea and generalized upper abdominal pain (> LUQ). CT scan negative for recurrent obstruction or other acute findings. Etiology unclear. PUD? Biliary pain? Pain not consistently worse with PO intake so ischemia seems unlikely -Offered EGD on Plavix and Eliquis to be done tomorrow.  The risks and benefits of EGD with possible biopsies were discussed with the patient who agrees to proceed.  -NPO after MN -Continue daily PPI -If EGD negative and pain persists consider further gallbladder evaluation  Cholelithiasis vr gallbladder sludge on CT scan and US   CAD / history of stent placement. On Plavix   PAF on Eliquis  See PMH for additional medical history   History of Present Illness Patient is a 82 y.o. year old male whose past medical history includes but is not necessarily limited to CAD / stenting, PAF on Eliquis, CKd 3, kidney stones, CVA, DM,  diverticulosis, partial small bowel obstruction, colon polyps, post-polypectomy bleed  Patient was admitted mid June for SBO which resolved with supportive care. He presented to ED again on 6/27 with abdominal pain. CT scan suggested  panniculitis. He was discharged home and returned to ED yesterday with recurrent generalized upper abdominal pain ( > LUQ) and nausea without vomiting. Pain is described as burning. It is nearly constant, possibly gets worse with PO intake but hard to say that it gets better with defecation. Pain has no relationship to physical activity /  position.  Prior to recent partial SBO he hasn't had pain  like this before. He has chronic back pain so he is unsure if pain radiates through to his back. No NSAID use at home. He doesn't feel constipated.   WBC is normal. Hgb down from 13 to 11.3 without overt GI bleeding ( ? 2/2 IVF). Normal lipase. New mild elevation in liver enzymes  (2x ULN) on admission, improved overnight. . Afebrile.   CT scan + for small bilateral renal stones a 5mm bladder stones and layering sludge vrs gallstones without evidence for cholecystitis. No acute findings.    Labs and Imaging: Recent Labs    01/16/23 0555 01/17/23 0137  WBC 6.2 4.3  HGB 13.1 11.3*  HCT 38.0* 33.1*  PLT 209 182   Recent Labs    01/16/23 0555 01/17/23 0137  NA 128* 129*  K 4.1 4.2  CL 96* 100  CO2 21* 20*  GLUCOSE 158* 148*  BUN 39* 29*  CREATININE 1.92* 1.66*  CALCIUM 8.5* 8.0*   Recent Labs    01/17/23 0137  PROT 4.8*  ALBUMIN 2.5*  AST 48*  ALT 62*  ALKPHOS 102  BILITOT 0.9   No results for input(s): "HEPBSAG", "HCVAB", "HEPAIGM", "HEPBIGM" in the last 72 hours. No results for input(s): "LABPROT", "INR" in the last 72 hours.  Previous GI Evaluation:   June 2019  Unprepped to assess for post-polypectomy bleed. Procedure at   outside facility - Preparation of the colon was unsatisfactory. - Blood in the entire examined colon as above, residual stool present as well. - A single ulcer in the ascending colon from prior polypectomy site. I think less likely the cause for bleeding but one clip was placed to close the lesion. - A single ulcer with adherent clot / possible vessel in the distal transverse / proximal descending colon in an area with residual blood. It's possible this lesion could account for bleeding. 2 clips were placed. - Diverticulosis in the left colon. Unable to clear the colon completely due to residual stool, some areas of the sigmoid not well visualized. - Internal hemorrhoids. - The examination was otherwise normal.   Principal Problem:   Pain in the  abdomen Active Problems:   Type 2 diabetes mellitus with vascular disease (HCC)   Essential hypertension   CAD S/P percutaneous coronary angioplasty   Peripheral vascular disease (HCC)   History of CVA (cerebrovascular accident)   Paroxysmal atrial fibrillation (HCC)   Anticoagulated   Hyperlipidemia   CAD in native artery   CKD (chronic kidney disease), stage III (HCC)   Hyponatremia   AKI (acute kidney injury) (HCC)   Elevated troponin   Overweight   Thoracic ascending aortic aneurysm (HCC)   Abdominal pain     Past Medical History:  Diagnosis Date   Abdominal discomfort    Abdominal discomfort: EGD 2011 showed no significant abnormalities.  Possibly due to metformin.   Allergic rhinitis    Anticoagulated 05/30/2016   CAD (coronary artery disease)    a. s/p MI in 2004:  stents to the LAD and staged stent RCA, EF 55%;  b.  NSTEMI (1/16):  LHC - mid LAD stent with diff dist restenosis, prox to mid Dx mod dsz jailed by stent, small OM1 99, mCFX 75, dCFX 80, mRCA stent ok, mPDA mild to mod dsz, EF 55% >> PCI:  Synergy DES to LAD; Synergy DES (x2) mid and dist CFX   CAD in native artery 06/12/2019   Chronic kidney disease, stage 2, mildly decreased GFR    stage 2-3   DDD (degenerative disc disease)    L4 to S1 degenerative disease and spondylolisthesis: low back pain.  s/p back surgery   DM (diabetes mellitus) (HCC)    History of CVA (cerebrovascular accident)    Small CVA seen by head CT (incidental) in 1/13.  Carotid dopplers in 1/13 showed minimal disease   History of Doppler ultrasound    Arterial dopplers (3/11): no evidence for significant PAD. ABIs 10/12: Normal.     HLD (hyperlipidemia)    HTN (hypertension)    had side effects with chlorthalidone   Hx of cardiovascular stress test    Myoview (1/13): Small fixed apical septal defect with no ischemia, EF 58%.   Hx of echocardiogram    Echo (3/11): EF 60%, normal wall motion, mild MR, mild left atrial enlargement,  mildly dilated ascending aorta.   PAF (paroxysmal atrial fibrillation) (HCC) 05/29/2016   Spondylolisthesis    L4-S1   Statin intolerance     Past Surgical History:  Procedure Laterality Date   COLONOSCOPY N/A 12/26/2017   Procedure: COLONOSCOPY;  Surgeon: Armbruster, Steven P, MD;  Location: [facility];  Service: Gastroenterology;  Laterality: N/A;   COLONOSCOPY W/ POLYPECTOMY  12/2017   CORONARY ANGIOGRAM  11/13/2014   Procedure: CORONARY ANGIOGRAM;  Surgeon: Dalton S McLean, MD;  Location: [facility] CATH LAB;  Service: Cardiovascular;;   CORONARY STENT INTERVENTION   N/A 05/18/2019   Procedure: CORONARY STENT INTERVENTION;  Surgeon: Harding, David W, MD;  Location: [facility] INVASIVE CV LAB;  Service: Cardiovascular;  Laterality: N/A;   CORONARY STENT PLACEMENT  2004   x2 LAD and RCA    LEFT HEART CATH AND CORONARY ANGIOGRAPHY N/A 05/18/2019   Procedure: LEFT HEART CATH AND CORONARY ANGIOGRAPHY;  Surgeon: Harding, David W, MD;  Location: [facility]VAS[facility] CV [facility];  Service: Cardiovascular;  Laterality: N/A;   LEFT HEART CATH AND CORONARY ANGIOGRAPHY N/A 06/11/2019   Procedure: LEFT HEART CATH AND CORONARY ANGIOGRAPHY;  Surgeon: End, Christopher, MD;  Location: [facility] CV [facility];  Service: Cardiovascular;  Laterality: N/A;   LEFT HEART CATH AND CORONARY ANGIOGRAPHY N/A 10/27/2020   Procedure: LEFT HEART CATH AND CORONARY ANGIOGRAPHY;  Surgeon: Kelly, Thomas A, MD;  Location: [facility];  Service: Cardiovascular;  Laterality: N/A;   LEFT HEART CATHETERIZATION WITH CORONARY ANGIOGRAM N/A 08/02/2014   Procedure: LEFT HEART CATHETERIZATION WITH CORONARY ANGIOGRAM;  Surgeon: Jayadeep S Varanasi, MD;  Location: [facility] CATH [facility];  Service: Cardiovascular;  Laterality: N/A;   PROSTATE SURGERY     SPINAL FUSION     L4-S1    Family History  Problem Relation Age of Onset   Heart attack Mother    Heart attack Father    Stroke Neg Hx     Prior to Admission medications   Medication Sig Start Date End Date  Taking? Authorizing Provider  acetaminophen (TYLENOL) 650 MG CR tablet Take 1 tablet (650 mg total) by mouth every 8 (eight) hours as needed for pain. 01/06/23  Yes Patel, Pranav M, MD  apixaban (ELIQUIS) 5 MG TABS tablet Take 1 tablet (5 mg total) by mouth 2 (two) times daily. 12/09/22  Yes Nahser, Philip J, MD  carvedilol (COREG) 6.25 MG tablet TAKE 1 TABLET BY MOUTH TWICE A DAY Patient taking differently: Take 6.25 mg by mouth 2 (two) times daily with a meal. 06/29/22  Yes Nahser, Philip J, MD  Cholecalciferol (VITAMIN D3) 25 MCG (1000 UT) CAPS Take 1,000 Units by mouth daily.   Yes [provider]  clopidogrel (PLAVIX) 75 MG tablet TAKE 1 TABLET BY MOUTH EVERY DAY Patient taking differently: Take 75 mg by mouth daily. 08/11/22  Yes Nahser, Philip J, MD  diltiazem (CARDIZEM) 30 MG tablet Take 1 tablet (30 mg total) by mouth as needed (Rapid Palpitations). 01/31/18  Yes Weaver, Scott T, PA-C  fish oil-omega-3 fatty acids 1000 MG capsule Take 1 g by mouth at bedtime.   Yes [provider]  isosorbide mononitrate (IMDUR) 30 MG 24 hr tablet Take 30 mg by mouth every evening.   Yes [provider]  loperamide (IMODIUM) 2 MG capsule Take 2 mg by mouth as needed for diarrhea or loose stools.   Yes [provider]  nitroGLYCERIN (NITROSTAT) 0.4 MG SL tablet PLACE 1 TAB UNDER THE TONGUE EVERY 5 MINS AS NEEDED FOR CHEST PAIN FOR UP TO 3 DOSES Patient taking differently: Place 0.4 mg under the tongue every 5 (five) minutes as needed for chest pain. 05/07/22  Yes Nahser, Philip J, MD  ondansetron (ZOFRAN) 4 MG tablet Take 1 tablet (4 mg total) by mouth every 8 (eight) hours as needed for nausea or vomiting. 01/06/23 01/06/24 Yes Patel, Pranav M, MD  oxymetazoline (AFRIN) 0.05 % nasal spray Place 2 sprays into both nostrils at bedtime.   Yes [provider]  pantoprazole (PROTONIX) 40 MG tablet Take 1 tablet (40 mg total) by mouth   daily. 01/06/23  Yes Patel, Pranav M, MD   polyethylene glycol (MIRALAX) 17 g packet Take 17 g by mouth daily. Patient taking differently: Take 17 g by mouth daily as needed for mild constipation. 01/06/23  Yes Patel, Pranav M, MD  sulfamethoxazole-trimethoprim (BACTRIM DS) 800-160 MG tablet Take 1 tablet by mouth 2 (two) times daily. 01/12/23  Yes [provider]  telmisartan (MICARDIS) 20 MG tablet Take 1 tablet (20 mg total) by mouth daily. 01/06/23  Yes Patel, Pranav M, MD  TRESIBA FLEXTOUCH 100 UNIT/ML FlexTouch Pen Inject 10 Units into the skin daily. Patient taking differently: Inject 40-44 Units into the skin daily. Adjust based on blood sugar. 01/06/23  Yes Patel, Pranav M, MD  Zinc Acetate, Oral, (ZINC ACETATE PO) Take 1 tablet by mouth daily.   Yes [provider]  doxycycline (VIBRA-TABS) 100 MG tablet Take 100 mg by mouth 2 (two) times daily. Patient not taking: Reported on 01/16/2023 01/08/23   [provider]  ondansetron (ZOFRAN-ODT) 8 MG disintegrating tablet Take 1 tablet (8 mg total) by mouth every 8 (eight) hours as needed for nausea or vomiting. Patient not taking: Reported on 01/16/2023 01/13/23   Knapp, Jon, MD  simethicone (MYLICON) 80 MG chewable tablet Chew 1 tablet (80 mg total) by mouth 4 (four) times daily. Patient not taking: Reported on 01/16/2023 01/06/23   Patel, Pranav M, MD    Current Facility-Administered Medications  Medication Dose Route Frequency Provider Last Rate Last Admin   acetaminophen (TYLENOL) tablet 650 mg  650 mg Oral Q6H PRN Li, Na, MD       Or   acetaminophen (TYLENOL) suppository 650 mg  650 mg Rectal Q6H PRN Li, Na, MD       apixaban (ELIQUIS) tablet 5 mg  5 mg Oral BID Li, Na, MD   5 mg at 01/17/23 0939   carvedilol (COREG) tablet 6.25 mg  6.25 mg Oral BID WC Li, Na, MD   6.25 mg at 01/17/23 0939   clopidogrel (PLAVIX) tablet 75 mg  75 mg Oral Daily Li, Na, MD   75 mg at 01/17/23 0939   guaiFENesin (ROBITUSSIN) 100 MG/5ML liquid 5 mL  5 mL Oral Q4H PRN Amin, Ankit  Chirag, MD       hydrALAZINE (APRESOLINE) injection 10 mg  10 mg Intravenous Q4H PRN Amin, Ankit Chirag, MD       ipratropium-albuterol (DUONEB) 0.5-2.5 (3) MG/3ML nebulizer solution 3 mL  3 mL Nebulization Q4H PRN Amin, Ankit Chirag, MD       ipratropium-albuterol (DUONEB) 0.5-2.5 (3) MG/3ML nebulizer solution 3 mL  3 mL Nebulization TID Amin, Ankit Chirag, MD   3 mL at 01/17/23 1404   isosorbide mononitrate (IMDUR) 24 hr tablet 30 mg  30 mg Oral QPM Li, Na, MD   30 mg at 01/16/23 1814   metoprolol tartrate (LOPRESSOR) injection 5 mg  5 mg Intravenous Q4H PRN Amin, Ankit Chirag, MD       nitroGLYCERIN (NITROSTAT) SL tablet 0.4 mg  0.4 mg Sublingual Q5 min PRN Li, Na, MD       ondansetron (ZOFRAN) tablet 4 mg  4 mg Oral Q6H PRN Li, Na, MD       Or   ondansetron (ZOFRAN) injection 4 mg  4 mg Intravenous Q6H PRN Li, Na, MD       pantoprazole (PROTONIX) EC tablet 40 mg  40 mg Oral Daily Li, Na, MD   40 mg at 01/17/23 0939   polyethylene glycol (  MIRALAX / GLYCOLAX) packet 17 g  17 g Oral Daily PRN Li, Na, MD       senna-docusate (Senokot-S) tablet 1 tablet  1 tablet Oral QHS PRN Amin, Ankit Chirag, MD       traZODone (DESYREL) tablet 50 mg  50 mg Oral QHS PRN Amin, Ankit Chirag, MD        Allergies as of 01/16/2023 - Review Complete 01/16/2023  Allergen Reaction Noted   Bempedoic acid Other (See Comments) 03/21/2019   [name] [alirocumab] Other (See Comments) 07/24/2018   Rosuvastatin Other (See Comments) 04/06/2017   Zetia [ezetimibe] Other (See Comments) 08/02/2017   Hydralazine Other (See Comments) 06/01/2021   Keflex [cephalexin] Other (See Comments) 10/27/2020    Social History   Socioeconomic History   Marital status: Divorced    Spouse name: Not on file   Number of children: 2   Years of education: Not on file   Highest education level: Not on file  Occupational History   Occupation: retired  Tobacco Use   Smoking status: Never   Smokeless tobacco: Never  Vaping Use    Vaping Use: Never used  Substance and Sexual Activity   Alcohol use: No   Drug use: No   Sexual activity: Not Currently  Other Topics Concern   Not on file  Social History Narrative   Not on file   Social Determinants of Health   Financial Resource Strain: Not on file  Food Insecurity: No Food Insecurity (01/16/2023)   Hunger Vital Sign    Worried About Running Out of Food in the Last Year: Never true    Ran Out of Food in the Last Year: Never true  Transportation Needs: No Transportation Needs (01/16/2023)   PRAPARE - Transportation    Lack of Transportation (Medical): No    Lack of Transportation (Non-Medical): No  Physical Activity: Not on file  Stress: Not on file  Social Connections: Not on file  Intimate Partner Violence: Not At Risk (01/16/2023)   Humiliation, Afraid, Rape, and Kick questionnaire    Fear of Current or Ex-Partner: No    Emotionally Abused: No    Physically Abused: No    Sexually Abused: No     Code Status   Code Status: Full Code  Review of Systems: All systems reviewed and negative except where noted in HPI.  Physical Exam: Vital signs in last 24 hours: Temp:  [98.3 F (36.8 C)-98.8 F (37.1 C)] 98.3 F (36.8 C) (07/01 0923) Pulse Rate:  [52-93] 67 (07/01 1025) Resp:  [17-18] 18 (07/01 0923) BP: (130-137)/(60-66) 137/66 (07/01 0923) SpO2:  [90 %-95 %] 95 % (07/01 1025) Last BM Date : 01/15/23  General:  Pleasant male in NAD Psych:  Cooperative. Normal mood and affect Eyes: Pupils equal Ears:  Normal auditory acuity Nose: No deformity, discharge or lesions Neck:  Supple, no masses felt Lungs:  Clear to auscultation.  Heart:  Regular rate Abdomen:  Soft, nondistended, nontender, active bowel sounds, no masses felt Rectal :  Deferred Msk: Symmetrical without gross deformities.  Neurologic:  Alert, oriented, grossly normal neurologically Extremities : No edema Skin:  Intact without significant lesions.    Intake/Output from previous  day: 06/30 0701 - 07/01 0700 In: 920.6 [I.V.:920.6] Out: 450 [Urine:450] Intake/Output this shift:  Total I/O In: 885.6 [P.O.:620; I.V.:265.6] Out: 1180 [Urine:1180]     Yukiko Minnich, NP-C @  01/17/2023, 2:41 PM     

## 2023-01-18 ENCOUNTER — Inpatient Hospital Stay (HOSPITAL_COMMUNITY): Payer: Medicare Other | Admitting: Anesthesiology

## 2023-01-18 ENCOUNTER — Encounter (HOSPITAL_COMMUNITY): Payer: Self-pay | Admitting: Internal Medicine

## 2023-01-18 ENCOUNTER — Inpatient Hospital Stay (HOSPITAL_COMMUNITY): Payer: Medicare Other

## 2023-01-18 ENCOUNTER — Encounter (HOSPITAL_COMMUNITY)
Admission: EM | Disposition: A | Discharge status: Skilled Nursing Facility | Payer: Self-pay | Source: Home / Self Care | Attending: Internal Medicine

## 2023-01-18 DIAGNOSIS — I252 Old myocardial infarction: Secondary | ICD-10-CM

## 2023-01-18 DIAGNOSIS — K2980 Duodenitis without bleeding: Secondary | ICD-10-CM

## 2023-01-18 DIAGNOSIS — K449 Diaphragmatic hernia without obstruction or gangrene: Secondary | ICD-10-CM

## 2023-01-18 DIAGNOSIS — I428 Other cardiomyopathies: Secondary | ICD-10-CM | POA: Diagnosis not present

## 2023-01-18 DIAGNOSIS — K3189 Other diseases of stomach and duodenum: Secondary | ICD-10-CM

## 2023-01-18 DIAGNOSIS — K317 Polyp of stomach and duodenum: Secondary | ICD-10-CM

## 2023-01-18 DIAGNOSIS — K222 Esophageal obstruction: Secondary | ICD-10-CM

## 2023-01-18 DIAGNOSIS — R1010 Upper abdominal pain, unspecified: Secondary | ICD-10-CM

## 2023-01-18 DIAGNOSIS — D132 Benign neoplasm of duodenum: Secondary | ICD-10-CM

## 2023-01-18 HISTORY — PX: BIOPSY: SHX5522

## 2023-01-18 HISTORY — PX: ESOPHAGOGASTRODUODENOSCOPY: SHX5428

## 2023-01-18 LAB — PHOSPHORUS: Phosphorus: 2.4 mg/dL — ABNORMAL LOW (ref 2.5–4.6)

## 2023-01-18 LAB — BASIC METABOLIC PANEL
Anion gap: 8 (ref 5–15)
BUN: 20 mg/dL (ref 8–23)
CO2: 22 mmol/L (ref 22–32)
Calcium: 8.2 mg/dL — ABNORMAL LOW (ref 8.9–10.3)
Chloride: 97 mmol/L — ABNORMAL LOW (ref 98–111)
Creatinine, Ser: 1.33 mg/dL — ABNORMAL HIGH (ref 0.61–1.24)
GFR, Estimated: 53 mL/min — ABNORMAL LOW (ref >60)
Glucose, Bld: 193 mg/dL — ABNORMAL HIGH (ref 70–99)
Potassium: 3.8 mmol/L (ref 3.5–5.1)
Sodium: 127 mmol/L — ABNORMAL LOW (ref 135–145)

## 2023-01-18 LAB — ECHOCARDIOGRAM COMPLETE
AR max vel: 2.51 cm2
AV Area VTI: 2.48 cm2
AV Area mean vel: 2.41 cm2
AV Mean grad: 3 mmHg
AV Peak grad: 6.6 mmHg
Ao pk vel: 1.28 m/s
Area-P 1/2: 3.72 cm2
Height: 74 in
S' Lateral: 3.5 cm
Weight: 3439.18 oz

## 2023-01-18 LAB — CBC
HCT: 35.6 % — ABNORMAL LOW (ref 39.0–52.0)
Hemoglobin: 12.4 g/dL — ABNORMAL LOW (ref 13.0–17.0)
MCH: 30.6 pg (ref 26.0–34.0)
MCHC: 34.8 g/dL (ref 30.0–36.0)
MCV: 87.9 fL (ref 80.0–100.0)
Platelets: 207 10*3/uL (ref 150–400)
RBC: 4.05 MIL/uL — ABNORMAL LOW (ref 4.22–5.81)
RDW: 12.2 % (ref 11.5–15.5)
WBC: 5.4 10*3/uL (ref 4.0–10.5)
nRBC: 0 % (ref 0.0–0.2)

## 2023-01-18 LAB — GLUCOSE, CAPILLARY
Glucose-Capillary: 177 mg/dL — ABNORMAL HIGH (ref 70–99)
Glucose-Capillary: 201 mg/dL — ABNORMAL HIGH (ref 70–99)
Glucose-Capillary: 190 mg/dL — ABNORMAL HIGH (ref 70–99)
Glucose-Capillary: 295 mg/dL — ABNORMAL HIGH (ref 70–99)
Glucose-Capillary: 216 mg/dL — ABNORMAL HIGH (ref 70–99)

## 2023-01-18 LAB — MAGNESIUM: Magnesium: 2 mg/dL (ref 1.7–2.4)

## 2023-01-18 SURGERY — EGD (ESOPHAGOGASTRODUODENOSCOPY)
Anesthesia: Monitor Anesthesia Care

## 2023-01-18 MED ORDER — PROPOFOL 500 MG/50ML IV EMUL
INTRAVENOUS | Status: DC | PRN
  Administered 2023-01-18: 150 ug/kg/min via INTRAVENOUS

## 2023-01-18 MED ORDER — FUROSEMIDE 10 MG/ML IJ SOLN
20.0000 mg | Freq: Once | INTRAMUSCULAR | Status: AC
  Administered 2023-01-18: 20 mg via INTRAVENOUS
  Filled 2023-01-18: qty 2

## 2023-01-18 MED ORDER — PANTOPRAZOLE SODIUM 40 MG PO TBEC
40.0000 mg | DELAYED_RELEASE_TABLET | Freq: Two times a day (BID) | ORAL | Status: DC
  Administered 2023-01-18 – 2023-01-21 (×7): 40 mg via ORAL
  Filled 2023-01-18 (×7): qty 1

## 2023-01-18 MED ORDER — SUCRALFATE 1 G PO TABS
1.0000 g | ORAL_TABLET | Freq: Two times a day (BID) | ORAL | Status: DC
  Administered 2023-01-18 – 2023-01-21 (×7): 1 g via ORAL
  Filled 2023-01-18 (×7): qty 1

## 2023-01-18 MED ORDER — LIDOCAINE HCL (CARDIAC) PF 100 MG/5ML IV SOSY
PREFILLED_SYRINGE | INTRAVENOUS | Status: DC | PRN
  Administered 2023-01-18: 80 mg via INTRAVENOUS

## 2023-01-18 MED ORDER — PROPOFOL 10 MG/ML IV BOLUS
INTRAVENOUS | Status: DC | PRN
  Administered 2023-01-18: 30 mg via INTRAVENOUS

## 2023-01-18 MED ORDER — SODIUM CHLORIDE 0.9 % IV SOLN: INTRAVENOUS | Status: DC

## 2023-01-18 MED ORDER — LACTATED RINGERS IV SOLN: INTRAVENOUS | Status: DC

## 2023-01-18 NOTE — [place] FL2 (Signed)
[place] MEDICAID FL2 LEVEL OF CARE FORM     IDENTIFICATION  [name] Birthdate: May 08, 1941 Sex: male Admission Date (Current Location): 01/16/2023  [facility] and [place] Number:  Producer, television/film/video and Address:  The . [facility], [address], [place], [place] [address]      Provider Number: [id number]  Attending Physician Name and Address:  Dimple Nanas, MD  Relative Name and Phone Number:  Kathrin Penner (Daughter) [phone]    Current Level of Care: Hospital Recommended Level of Care: Skilled Nursing Facility Prior Approval Number:    Date Approved/Denied:   PASRR Number: [id number]  Discharge Plan: SNF    Current Diagnoses: Patient Active Problem List   Diagnosis Date Noted   Abdominal pain 01/17/2023   Pain in the abdomen 01/16/2023   Hyponatremia 01/16/2023   AKI (acute kidney injury) (HCC) 01/16/2023   Elevated troponin 01/16/2023   Overweight 01/16/2023   Thoracic ascending aortic aneurysm (HCC) 01/16/2023   SBO (small bowel obstruction) (HCC) 01/03/2023   CKD (chronic kidney disease), stage III (HCC) 01/02/2023   Small bowel obstruction (HCC) 01/02/2023   CAD in native artery 06/12/2019   Unstable angina (HCC) 05/17/2019   Lower GI bleed 12/26/2017   Postural dizziness with presyncope 12/26/2017   Coronary artery disease of native artery of native heart with stable angina pectoris (HCC) 04/11/2017   Hyperlipidemia 04/11/2017   Anticoagulated 05/30/2016   Paroxysmal atrial fibrillation (HCC) 05/29/2016   Exertional angina (HCC) 08/02/2014   Bradycardia 02/02/2012   History of CVA (cerebrovascular accident) 08/01/2011   Leg pain 03/31/2011   Abdominal pain 10/13/2010   Hip pain 10/13/2010   Essential hypertension 09/09/2009   Peripheral vascular disease (HCC) 09/09/2009   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   CAD S/P percutaneous coronary angioplasty 09/08/2009     Orientation RESPIRATION BLADDER Height & Weight     Self, Time, Situation, Place  O2 (2L nasal cannula) Continent Weight: 214 lb 15.2 oz (97.5 kg) Height:  6\' 2"  (188 cm)  BEHAVIORAL SYMPTOMS/MOOD NEUROLOGICAL BOWEL NUTRITION STATUS      Continent Diet (see d/c summary)  AMBULATORY STATUS COMMUNICATION OF NEEDS Skin   Extensive Assist Verbally Normal                       Personal Care Assistance Level of Assistance  Bathing, Feeding, Dressing Bathing Assistance: Limited assistance Feeding assistance: Independent Dressing Assistance: Limited assistance     Functional Limitations Info  Sight, Hearing, Speech Sight Info: Adequate Hearing Info: Impaired Speech Info: Adequate    SPECIAL CARE FACTORS FREQUENCY  PT (By licensed PT), OT (By licensed OT)     PT Frequency: 5x/ week OT Frequency: 5x/ week            Contractures Contractures Info: Not present    Additional Factors Info  Insulin Sliding Scale, Code Status, Allergies Code Status Info: Full Allergies Info: Bempedoic Acid  [name] (Alirocumab)  Rosuvastatin  Zetia (Ezetimibe)  Hydralazine  Keflex (Cephalexin)   Insulin Sliding Scale Info: see d/c medication list       Current Medications (01/18/2023):  This is the current hospital active medication list Current Facility-Administered Medications  Medication Dose Route Frequency Provider Last Rate Last Admin   acetaminophen (TYLENOL) tablet 650 mg  650 mg Oral Q6H PRN Dierdre Searles, Na, MD       Or   acetaminophen (TYLENOL) suppository 650 mg  650 mg Rectal  Q6H PRN Dede Query, MD       apixaban (ELIQUIS) tablet 5 mg  5 mg Oral BID Dierdre Searles, Na, MD   5 mg at 01/18/23 0926   carvedilol (COREG) tablet 6.25 mg  6.25 mg Oral BID WC Dierdre Searles, Na, MD   6.25 mg at 01/17/23 1722   clopidogrel (PLAVIX) tablet 75 mg  75 mg Oral Daily Li, Na, MD   75 mg at 01/18/23 0926   guaiFENesin (ROBITUSSIN) 100 MG/5ML liquid 5 mL  5 mL Oral Q4H PRN Amin, Ankit Chirag, MD       hydrALAZINE  (APRESOLINE) injection 10 mg  10 mg Intravenous Q4H PRN Amin, Ankit Chirag, MD       insulin aspart (novoLOG) injection 0-15 Units  0-15 Units Subcutaneous TID WC Amin, Ankit Chirag, MD   3 Units at 01/18/23 1145   insulin aspart (novoLOG) injection 2 Units  2 Units Subcutaneous TID WC Amin, Ankit Chirag, MD   2 Units at 01/18/23 1145   ipratropium-albuterol (DUONEB) 0.5-2.5 (3) MG/3ML nebulizer solution 3 mL  3 mL Nebulization Q4H PRN Amin, Ankit Chirag, MD       ipratropium-albuterol (DUONEB) 0.5-2.5 (3) MG/3ML nebulizer solution 3 mL  3 mL Nebulization TID Amin, Ankit Chirag, MD   3 mL at 01/18/23 1321   isosorbide mononitrate (IMDUR) 24 hr tablet 30 mg  30 mg Oral QPM Li, Na, MD   30 mg at 01/17/23 1722   metoprolol tartrate (LOPRESSOR) injection 5 mg  5 mg Intravenous Q4H PRN Amin, Ankit Chirag, MD       nitroGLYCERIN (NITROSTAT) SL tablet 0.4 mg  0.4 mg Sublingual Q5 min PRN Dierdre Searles, Na, MD       ondansetron (ZOFRAN) tablet 4 mg  4 mg Oral Q6H PRN Dierdre Searles, Na, MD       Or   ondansetron (ZOFRAN) injection 4 mg  4 mg Intravenous Q6H PRN Dierdre Searles, Na, MD       pantoprazole (PROTONIX) EC tablet 40 mg  40 mg Oral BID AC Mansouraty, Netty Starring., MD   40 mg at 01/18/23 0926   polyethylene glycol (MIRALAX / GLYCOLAX) packet 17 g  17 g Oral Daily PRN Dierdre Searles, Na, MD       senna-docusate (Senokot-S) tablet 1 tablet  1 tablet Oral QHS PRN Amin, Ankit Chirag, MD       sucralfate (CARAFATE) tablet 1 g  1 g Oral BID Mansouraty, Netty Starring., MD   1 g at 01/18/23 0926   traZODone (DESYREL) tablet 50 mg  50 mg Oral QHS PRN Dimple Nanas, MD         Discharge Medications: Please see discharge summary for a list of discharge medications.  Relevant Imaging Results:  Relevant Lab Results:   Additional Information SSN: [id number]  Flynn Lininger F Koven Belinsky, LCSW

## 2023-01-18 NOTE — Evaluation (Signed)
Physical Therapy Evaluation Patient Details Name: Joseph [name] MRN: [id number] DOB: 12/08/1940 Today's Date: 01/18/2023  History of Present Illness  82 yo male with recent partial SBO which resolved with conservative measures. Now readmitted with recurrent nausea and generalized upper abdominal pain. Underwent EGD and revealed duodenitis/duodenal ulcer PMH: CAD / stenting, PAF on Eliquis, CKd 3, kidney stones, CVA, DM,  diverticulosis, partial small bowel obstruction, colon polyps, post-polypectomy bleed   Clinical Impression  Pt admitted with above. PTA pt was indep, living alone, driving, and didn't use any AD. Pt now presenting with generalized weakness, deconditioned, DOE 3/4 with mobility, SOB with activity on 2lO2 via [place], impaired balance, and requires use of RW to amb 10' in room. Pt to benefit from inpatient rehab program < 3 hours a day to achieve safe mod I level of function for safe transition home. Acute PT to cont to follow.        Assistance Recommended at Discharge Frequent or constant Supervision/Assistance  If plan is discharge home, recommend the following:  Can travel by private vehicle  A little help with walking and/or transfers;A little help with bathing/dressing/bathroom;Assistance with cooking/housework;Assist for transportation;Help with stairs or ramp for entrance   Yes    Equipment Recommendations Rolling walker (2 wheels)  Recommendations for Other Services       Functional Status Assessment Patient has had a recent decline in their functional status and demonstrates the ability to make significant improvements in function in a reasonable and predictable amount of time.     Precautions / Restrictions Precautions Precautions: Fall Restrictions Weight Bearing Restrictions: No      Mobility  Bed Mobility Overal bed mobility: Modified Independent             General bed mobility comments: HOB elevated, used bed rail. increased time, no physical  assist needed    Transfers Overall transfer level: Needs assistance Equipment used: Rolling walker (2 wheels) Transfers: Sit to/from Stand Sit to Stand: Min assist           General transfer comment: minA to power up, increased time, pt with posterior bias and sway requiring minA to maintain standing. Pt then returned to sitting. minA to power up again with verbal cues for hand placement    Ambulation/Gait Ambulation/Gait assistance: Min assist Gait Distance (Feet): 10 Feet Assistive device: Rolling walker (2 wheels) Gait Pattern/deviations: Decreased stride length, Step-to pattern, Trunk flexed Gait velocity: dec Gait velocity interpretation: <1.8 ft/sec, indicate of risk for recurrent falls   General Gait Details: pt +SOB, SPO2 92% on 2lO2 via [place]. pt with short shuffled steps from one side of the bed to the other with report of 3/4 DOE.  Stairs            Wheelchair Mobility     Tilt Bed    Modified Rankin (Stroke Patients Only)       Balance Overall balance assessment: Needs assistance Sitting-balance support: Feet supported, No upper extremity supported Sitting balance-Leahy Scale: Fair     Standing balance support: During functional activity, Reliant on assistive device for balance, Bilateral upper extremity supported Standing balance-Leahy Scale: Fair Standing balance comment: reliant on RW for safe amb                             Pertinent Vitals/Pain Pain Assessment Pain Assessment: 0-10 Pain Score: 4  Pain Location: abdomen Pain Descriptors / Indicators: Sore Pain Intervention(s): Monitored during session  Home Living Family/patient expects to be discharged to:: Private residence Living Arrangements: Alone Available Help at Discharge: Friend(s);Neighbor;Available PRN/intermittently Type of Home: House Home Access: Stairs to enter Entrance Stairs-Rails: Doctor, general practice of Steps: 3   Home Layout: One  level Home Equipment: Shower seat      Prior Function Prior Level of Function : Independent/Modified Independent;Driving             Mobility Comments: indep, no AD ADLs Comments: indep     Hand Dominance   Dominant Hand: Right    Extremity/Trunk Assessment   Upper Extremity Assessment Upper Extremity Assessment: Generalized weakness    Lower Extremity Assessment Lower Extremity Assessment: Generalized weakness    Cervical / Trunk Assessment Cervical / Trunk Assessment: Other exceptions Cervical / Trunk Exceptions: h/o neck and back pain  Communication   Communication: HOH  Cognition Arousal/Alertness: Awake/alert Behavior During Therapy: WFL for tasks assessed/performed Overall Cognitive Status: Within Functional Limits for tasks assessed                                          General Comments General comments (skin integrity, edema, etc.): edema t/o body, SpO2 91% on RA at rest, 92% on 2LO2 via [place] during amb    Exercises     Assessment/Plan    PT Assessment Patient needs continued PT services  PT Problem List Decreased strength;Decreased range of motion;Decreased activity tolerance;Decreased balance;Decreased mobility;Decreased coordination;Decreased knowledge of use of DME;Decreased safety awareness       PT Treatment Interventions DME instruction;Gait training;Stair training;Functional mobility training;Therapeutic activities;Therapeutic exercise;Balance training;Neuromuscular re-education    PT Goals (Current goals can be found in the Care Plan section)  Acute Rehab PT Goals Patient Stated Goal: get stronger PT Goal Formulation: With patient/family Time For Goal Achievement: 02/01/23 Potential to Achieve Goals: Good    Frequency Min 3X/week     Co-evaluation               AM-PAC PT "6 Clicks" Mobility  Outcome Measure Help needed turning from your back to your side while in a flat bed without using bedrails?:  None Help needed moving from lying on your back to sitting on the side of a flat bed without using bedrails?: A Little Help needed moving to and from a bed to a chair (including a wheelchair)?: A Little Help needed standing up from a chair using your arms (e.g., wheelchair or bedside chair)?: A Little Help needed to walk in hospital room?: A Lot Help needed climbing 3-5 steps with a railing? : A Lot 6 Click Score: 17    End of Session Equipment Utilized During Treatment: Gait belt;Oxygen Activity Tolerance: Patient tolerated treatment well Patient left: in chair;with call bell/phone within reach;with family/visitor present Nurse Communication: Mobility status (dumped 675 ml from urinal) PT Visit Diagnosis: Unsteadiness on feet (R26.81);Muscle weakness (generalized) (M62.81);Difficulty in walking, not elsewhere classified (R26.2)    Time: 5732-2025 PT Time Calculation (min) (ACUTE ONLY): 32 min   Charges:   PT Evaluation $PT Eval Moderate Complexity: 1 Mod PT Treatments $Gait Training: 8-22 mins PT General Charges $$ ACUTE PT VISIT: 1 Visit         Lewis Shock, PT, DPT Acute Rehabilitation Services Secure chat preferred Office #: [phone]   Iona Hansen 01/18/2023, 2:23 PM

## 2023-01-18 NOTE — Progress Notes (Signed)
PROGRESS NOTE    Joseph QUINCEY  [name]:[id number] DOB: 1941-03-07 DOA: 01/16/2023 PCP: Erskine Emery, NP   Brief Narrative:   82 y.o. male with medical history significant of CAD, stent to LAD and PCI to RCA in 2004, DES to LAD and mid and distal Circumflex, PAF on Eliquis, T2DM, HTN, HLD intolerant to statins, CKD stage III, CVA, who presented with nausea and abdominal pain.  Patient was admitted to the hospital about 2 weeks ago for small bowel obstruction treated with NG tube and conservative measures and eventually discharged home.  After going home patient started developing intermittent nausea and vomiting for 5 days prior to admission.  In between came to the ED on 6/2 7, CT abdomen pelvis showed panniculitis and was discharged home again.  Upon this admission noted to have AKI, hyponatremia with clinical signs of dehydration.  Right upper quadrant ultrasound showed hepatic steatosis, CT abdomen pelvis showed 5 mm bladder stone otherwise no acute pathology.  CTA was also negative.  Eventually due to abdominal symptoms, GI was consulted and patient underwent endoscopy which showed duodenitis/duodenal ulcer started on PPI and Carafate.   Assessment & Plan:  Principal Problem:   Pain in the abdomen Active Problems:   Paroxysmal atrial fibrillation (HCC)   Essential hypertension   Type 2 diabetes mellitus with vascular disease (HCC)   CKD (chronic kidney disease), stage III (HCC)   History of CVA (cerebrovascular accident)   Hyperlipidemia   CAD S/P percutaneous coronary angioplasty   Peripheral vascular disease (HCC)   Anticoagulated   CAD in native artery   Hyponatremia   AKI (acute kidney injury) (HCC)   Elevated troponin   Overweight   Thoracic ascending aortic aneurysm (HCC)   Abdominal pain    Intermittent nausea vomiting with abdominal pain Duodenitis/duodenal ulcer Duodenal polyp - Seen by [facility].  Status post endoscopy 7/2.  Showed duodenal ulcer/duodenitis duodenal  polyp which was biopsied.  For now recommending PPI twice daily and Carafate and outpatient follow-up.  Recent small bowel obstruction History of peripheral arterial disease Transaminitis - No evidence of obstruction during this visit.  Seen by GI. -Transaminitis slowly improving.  Lipase negative.  Hyponatremia/acute kidney injury CKD stage IIIa - Baseline creatinine 1.2, admission creatinine 1.92.  Improved  Abnormal BS Scheduled and as needed bronchodilators.  I-S/flutter valve.  Lasix 20 mg IV once today  Paroxysmal atrial fibrillation on Eliquis History of coronary artery disease status post stent - Continue Eliquis.  Continue Coreg, Imdur, Plavix  Diabetes mellitus type 2 - A1c 7.5.  Sliding scale and Accu-Cheks  Essential hypertension - Coreg, Imdur.  IV as needed  Hyperlipidemia - Intolerant to statins  History of CVA - On Eliquis  PT/OT  DVT prophylaxis: Eliquis Code Status: Full  Family Communication: None at bed Closely monitor sodium, diet as tolerated.     Diet Orders (From admission, onward)     Start     Ordered   01/18/23 0709  Diet NPO time specified  Diet effective now        01/18/23 9562            Subjective:  Still having some coughing and dyspnea with exertion.  Tolerated his endoscopy well this morning. Examination: Constitutional: Not in acute distress Respiratory: Minimal basilar rhonchi Cardiovascular: Normal sinus rhythm, no rubs Abdomen: Nontender nondistended good bowel sounds Musculoskeletal: No edema noted Skin: No rashes seen Neurologic: CN 2-12 grossly intact.  And nonfocal Psychiatric: Normal judgment and insight. Alert  and oriented x 3. Normal mood.   Objective: Vitals:   01/18/23 0748 01/18/23 0750 01/18/23 0755 01/18/23 0800  BP: (!) 98/59 98/60  100/66  Pulse: 86 74 68 74  Resp: (!) 26 (!) 25 (!) 32 (!) 33  Temp: 97.7 F (36.5 C)     TempSrc: Temporal     SpO2: 93% 93% 92% 92%  Weight:      Height:         Intake/Output Summary (Last 24 hours) at 01/18/2023 0823 Last data filed at 01/18/2023 0750 Gross per 24 hour  Intake 1445.64 ml  Output 3480 ml  Net -2034.36 ml   Filed Weights   01/16/23 0550 01/16/23 1305  Weight: 97.1 kg 97.5 kg    Scheduled Meds:  apixaban  5 mg Oral BID   carvedilol  6.25 mg Oral BID WC   clopidogrel  75 mg Oral Daily   insulin aspart  0-15 Units Subcutaneous TID WC   insulin aspart  2 Units Subcutaneous TID WC   ipratropium-albuterol  3 mL Nebulization TID   isosorbide mononitrate  30 mg Oral QPM   pantoprazole  40 mg Oral BID AC   sucralfate  1 g Oral BID   Continuous Infusions:    Nutritional status     Body mass index is 27.6 kg/m.  Data Reviewed:   CBC: Recent Labs  Lab 01/13/23 1400 01/16/23 0555 01/17/23 0137 01/18/23 0240  WBC 15.4* 6.2 4.3 5.4  NEUTROABS 14.8* 5.5  --   --   HGB 13.8 13.1 11.3* 12.4*  HCT 40.1 38.0* 33.1* 35.6*  MCV 89.5 89.8 90.2 87.9  PLT 251 209 182 207   Basic Metabolic Panel: Recent Labs  Lab 01/13/23 1400 01/16/23 0555 01/17/23 0137 01/18/23 0240  NA 133* 128* 129* 127*  K 4.3 4.1 4.2 3.8  CL 102 96* 100 97*  CO2 21* 21* 20* 22  GLUCOSE 165* 158* 148* 193*  BUN 23 39* 29* 20  CREATININE 1.28* 1.92* 1.66* 1.33*  CALCIUM 9.3 8.5* 8.0* 8.2*  MG  --  2.1  --  2.0  PHOS  --   --   --  2.4*   GFR: Estimated Creatinine Clearance: 48.1 mL/min (A) (by C-G formula based on SCr of 1.33 mg/dL (H)). Liver Function Tests: Recent Labs  Lab 01/13/23 1400 01/16/23 0555 01/17/23 0137  AST 19 88* 48*  ALT 24 88* 62*  ALKPHOS 53 121 102  BILITOT 1.2 1.1 0.9  PROT 6.0* 5.7* 4.8*  ALBUMIN 3.6 3.1* 2.5*   Recent Labs  Lab 01/13/23 1400 01/16/23 0555  LIPASE 27 40   No results for input(s): "AMMONIA" in the last 168 hours. Coagulation Profile: No results for input(s): "INR", "PROTIME" in the last 168 hours. Cardiac Enzymes: No results for input(s): "CKTOTAL", "CKMB", "CKMBINDEX", "TROPONINI"  in the last 168 hours. BNP (last 3 results) No results for input(s): "PROBNP" in the last 8760 hours. HbA1C: No results for input(s): "HGBA1C" in the last 72 hours. CBG: Recent Labs  Lab 01/17/23 0748 01/17/23 1117 01/17/23 1705 01/17/23 2125 01/18/23 0712  GLUCAP 130* 173* 233* 213* 177*   Lipid Profile: No results for input(s): "CHOL", "HDL", "LDLCALC", "TRIG", "CHOLHDL", "LDLDIRECT" in the last 72 hours. Thyroid Function Tests: No results for input(s): "TSH", "T4TOTAL", "FREET4", "T3FREE", "THYROIDAB" in the last 72 hours. Anemia Panel: No results for input(s): "VITAMINB12", "FOLATE", "FERRITIN", "TIBC", "IRON", "RETICCTPCT" in the last 72 hours. Sepsis Labs: Recent Labs  Lab 01/16/23 1353 01/16/23 1705  01/16/23 2028  LATICACIDVEN 1.6 2.4* 1.2    Recent Results (from the past 240 hour(s))  SARS Coronavirus 2 by RT PCR (hospital order, performed in [facility] hospital lab) *cepheid single result test* Anterior Nasal Swab     Status: None   Collection Time: 01/16/23 12:02 PM   Specimen: Anterior Nasal Swab  Result Value Ref Range Status   SARS Coronavirus 2 by RT PCR NEGATIVE NEGATIVE Final    Comment: Performed at [facility], [address], [place], [place] [address]         Radiology Studies: VAS [place] MESENTERIC  Result Date: 01/17/2023 ABDOMINAL VISCERAL Patient Name:  Joseph [name]  Date of Exam:   01/17/2023 Medical Rec #: [id number]       Accession #:    [id number] Date of Birth: [birth year]       Patient Gender: M Patient Age:   29 years Exam Location:  [facility] Procedure:      VAS [place] MESENTERIC Referring Phys: [id number] NA LI -------------------------------------------------------------------------------- Indications: Intermittent nausea and abdominal pain. History of small bowel              obstruction in 6/24. High Risk Factors: Hypertension, hyperlipidemia, coronary artery disease. Limitations: Air/bowel gas. Performing Technologist: Marilynne Halsted  RDMS, RVT  Examination Guidelines: A complete evaluation includes B-mode imaging, spectral Doppler, color Doppler, and power Doppler as needed of all accessible portions of each vessel. Bilateral testing is considered an integral part of a complete examination. Limited examinations for reoccurring indications may be performed as noted.  Duplex Findings: +--------------------+--------+--------+------+----------------------+ Mesenteric          PSV cm/sEDV cm/sPlaque       Comments        +--------------------+--------+--------+------+----------------------+ Aorta Prox             90                                        +--------------------+--------+--------+------+----------------------+ Celiac Artery Origin  222                                        +--------------------+--------+--------+------+----------------------+ SMA Proximal          295                                        +--------------------+--------+--------+------+----------------------+ SMA Mid               124                                        +--------------------+--------+--------+------+----------------------+ SMA Distal            141                                        +--------------------+--------+--------+------+----------------------+ [facility]  not clearly visualized +--------------------+--------+--------+------+----------------------+ Splenic                                   not clearly visualized +--------------------+--------+--------+------+----------------------+ IMA                   198                                        +--------------------+--------+--------+------+----------------------+    Summary: Mesenteric:  Patent with mildly elevated velocities without significant stenosis.  *See table(s) above for measurements and observations.  Diagnosing physician: Gerarda Fraction  Electronically signed by Gerarda Fraction on 01/17/2023 at  3:13:33 PM.    Final    CT ABDOMEN PELVIS W CONTRAST  Addendum Date: 01/16/2023   ADDENDUM REPORT: 01/16/2023 09:50 ADDENDUM: The 1st impression contains a typographical error, and should read NO evidence of appendicitis or other acute findings. Electronically Signed   By: Danae Orleans M.D.   On: 01/16/2023 09:50   Result Date: 01/16/2023 CLINICAL DATA:  Right lower quadrant abdominal pain. Prior prostatectomy for prostate carcinoma. * Tracking Code: BO * EXAM: CT ABDOMEN AND PELVIS WITH CONTRAST TECHNIQUE: Multidetector CT imaging of the abdomen and pelvis was performed using the standard protocol following bolus administration of intravenous contrast. RADIATION DOSE REDUCTION: This exam was performed according to the departmental dose-optimization program which includes automated exposure control, adjustment of the mA and/or kV according to patient size and/or use of iterative reconstruction technique. CONTRAST:  60mL OMNIPAQUE IOHEXOL 350 MG/ML SOLN COMPARISON:  01/13/2023 FINDINGS: Lower Chest: Dependent bibasilar atelectasis, similar to prior exam. Hepatobiliary: No suspicious hepatic masses identified. Layering sludge versus tiny gallstones 1 again seen, without evidence of cholecystitis or biliary ductal dilatation. Pancreas:  No mass or inflammatory changes. Spleen: Within normal limits in size and appearance. Adrenals/Urinary Tract: No suspicious masses identified. Multiple small less than 1 cm bilateral renal calculi are seen. No evidence of ureteral calculi or hydronephrosis, however a 5 mm calculus is again seen in the urinary bladder near the left UVJ. Stomach/Bowel: No evidence of obstruction, inflammatory process or abnormal fluid collections. Diverticulosis is seen mainly involving the sigmoid colon, however there is no evidence of diverticulitis. Although the appendix is not directly visualized, no inflammatory process seen in region of the cecum or elsewhere. Vascular/Lymphatic: No  pathologically enlarged lymph nodes. No acute vascular findings. Aortic atherosclerotic calcification incidentally noted. Reproductive: Prior prostatectomy. No mass or other significant abnormality identified. Other:  None. Musculoskeletal: No suspicious bone lesions identified. Lower lumbar spine fusion hardware again noted. IMPRESSION: Evidence of appendicitis or other acute findings. Bilateral nephrolithiasis. Unchanged 5 mm calculus in urinary bladder near the left UVJ. No evidence of hydroureteronephrosis. Colonic diverticulosis, without radiographic evidence of diverticulitis. Layering sludge versus tiny gallstones, without radiographic evidence of cholecystitis. Prior prostatectomy. No evidence of abdominal or pelvic metastatic disease. Aortic Atherosclerosis (ICD10-I70.0). Electronically Signed: By: Danae Orleans M.D. On: 01/16/2023 09:41   CT Angio Chest PE W and/or Wo Contrast  Result Date: 01/16/2023 CLINICAL DATA:  Chest pain. Elevated D-dimer. Intermediate probability for pulmonary embolism. EXAM: CT ANGIOGRAPHY CHEST WITH CONTRAST TECHNIQUE: Multidetector CT imaging of the chest was performed using the standard protocol during bolus administration of intravenous contrast. Multiplanar CT image reconstructions and MIPs were obtained to evaluate the vascular anatomy. RADIATION DOSE REDUCTION: This exam  was performed according to the departmental dose-optimization program which includes automated exposure control, adjustment of the mA and/or kV according to patient size and/or use of iterative reconstruction technique. CONTRAST:  60mL OMNIPAQUE IOHEXOL 350 MG/ML SOLN COMPARISON:  05/17/2019 FINDINGS: Cardiovascular: Satisfactory opacification of pulmonary arteries noted, and no pulmonary emboli identified. No evidence of thoracic aortic dissection. 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 Aortic and coronary atherosclerotic calcification incidentally noted. Mediastinum/Nodes: No masses or  pathologically enlarged lymph nodes identified. Lungs/Pleura: Increased dependent atelectasis both lower lobes. No evidence of pulmonary consolidation mass, or pleural effusion. Upper abdomen: No acute findings. Musculoskeletal: No suspicious bone lesions identified. Review of the MIP images confirms the above findings. IMPRESSION: No evidence of pulmonary embolism. Increased dependent atelectasis in both lower lobes. 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 Recommend annual imaging followup by CTA or MRA. This recommendation follows 2010 ACCF/AHA/AATS/ACR/ASA/SCA/SCAI/SIR/STS/SVM Guidelines for the Diagnosis and Management of Patients with Thoracic Aortic Disease. Circulation. 2010; 121: Z610-R604. Aortic aneurysm NOS (ICD10-I71.9) Aortic Atherosclerosis (ICD10-I70.0). Electronically Signed   By: Danae Orleans M.D.   On: 01/16/2023 09:33           LOS: 1 day   Time spent= 35 mins    Iyonnah Ferrante Joline Maxcy, MD [facility]  If 7PM-7AM, please contact night-coverage  01/18/2023, 8:23 AM

## 2023-01-18 NOTE — Op Note (Signed)
[facility] [name] Procedure Date : 01/18/2023 MRN: [id number] Attending MD: Corliss Parish , MD, [id number] Date of Birth: 02-07-1941 CSN: [id number] Age: 82 Admit Type: Outpatient Procedure:                Upper GI endoscopy Indications:              Epigastric abdominal pain, Nausea with vomiting Providers:                Corliss Parish, MD, Carlena Hurl, Marja Kays, Technician Referring MD:             Willaim Rayas. Adela Lank, MD, inpatient medical service Medicines:                Monitored Anesthesia Care Complications:            No immediate complications. Estimated Blood Loss:     Estimated blood loss was minimal. Procedure:                Pre-Anesthesia Assessment:                           - Prior to the procedure, a History and Physical                            was performed, and patient medications and                            allergies were reviewed. The patient's tolerance of                            previous anesthesia was also reviewed. The risks                            and benefits of the procedure and the sedation                            options and risks were discussed with the patient.                            All questions were answered, and informed consent                            was obtained. Prior Anticoagulants: The patient                            last took Eliquis (apixaban) 1 day and Plavix                            (clopidogrel) 1 day prior to the procedure. ASA                            Grade Assessment: III - A patient with severe  systemic disease. After reviewing the risks and                            benefits, the patient was deemed in satisfactory                            condition to undergo the procedure.                           After obtaining informed consent, the endoscope was                            passed under  direct vision. Throughout the                            procedure, the patient's blood pressure, pulse, and                            oxygen saturations were monitored continuously. The                            GIF-H190 ([id number]) Olympus endoscope was introduced                            through the mouth, and advanced to the second part                            of duodenum. The upper GI endoscopy was                            accomplished without difficulty. The patient                            tolerated the procedure. Scope In: Scope Out: Findings:      No gross lesions were noted in the entire esophagus.      A non-obstructing Schatzki ring was found at the gastroesophageal       junction.      The Z-line was regular and was found 43 cm from the incisors.      A 2 cm hiatal hernia was present.      Localized moderately erythematous mucosa without bleeding was found in       the gastric antrum.      No other gross lesions were noted in the entire examined stomach.       Biopsies were taken with a cold forceps for histology and Helicobacter       pylori testing.      Segmental moderate inflammation characterized by friability, granularity       and shallow ulcerations was found in the duodenal bulb, in the sweep and       in the second portion of the duodenum. Biopsies were taken with a cold       forceps for histology.      A single 20 mm sessile polyp was found in the second portion of the       duodenum. A single biopsy was taken with a cold forceps for  histology to       rule in/out adenoma.      The major papilla was normal. Impression:               - No gross lesions in the entire esophagus.                            Non-obstructing Schatzki ring. Z-line regular, 43                            cm from the incisors.                           - 2 cm hiatal hernia.                           - Erythematous mucosa in the antrum. No other gross                             lesions in the entire stomach. Biopsied.                           - Duodenitis. Biopsied.                           - A single duodenal polypoid lesion. Biopsied.                           - Normal major papilla. Recommendation:           - The patient will be observed post-procedure,                            until all discharge criteria are met.                           - Return patient to hospital ward for ongoing care.                           - Resume previous diet.                           - Transition PPI to 40 mg twice daily.                           - Carafate twice daily for 1 month.                           - Await pathology results.                           - If duodenal polyp returned showing evidence of                            adenomatous tissue, then patient will need repeat  EGD with primary gastroenterologist in [place]                            versus in [place] within the next 3-6 months,                            pending ability to come off Eliquis and Plavix for                            duodenal polyp resection.                           - If negative duodenal polyp for adenomatous                            tissue, then consider repeat EGD with primary                            gastroenterologist in [place] versus in [place]                            in 4 months to ensure healing of duodenal ulcers.                           - Continue present medications otherwise.                           - The findings and recommendations were discussed                            with the patient.                           - The findings and recommendations were discussed                            with the patient's family. Procedure Code(s):        --- Professional ---                           [phone], Esophagogastroduodenoscopy, flexible,                            transoral; with biopsy, single or multiple Diagnosis Code(s):         --- Professional ---                           K22.2, Esophageal obstruction                           K44.9, Diaphragmatic hernia without obstruction or                            gangrene  K31.89, Other diseases of stomach and duodenum                           K29.80, Duodenitis without bleeding                           K31.7, Polyp of stomach and duodenum                           R10.13, Epigastric pain                           R11.2, Nausea with vomiting, unspecified CPT copyright 2022 American Medical Association. All rights reserved. The codes documented in this report are preliminary and upon coder review may  be revised to meet current compliance requirements. Corliss Parish, MD 01/18/2023 7:52:34 AM Number of Addenda: 0

## 2023-01-18 NOTE — Interval H&P Note (Signed)
History and Physical Interval Note:  01/18/2023 7:26 AM  Joseph [name]  has presented today for surgery, with the diagnosis of N/V/Abdominal pain.  The various methods of treatment have been discussed with the patient and family. After consideration of risks, benefits and other options for treatment, the patient has consented to  Procedure(s): ESOPHAGOGASTRODUODENOSCOPY (EGD) (N/A) as a surgical intervention.  The patient's history has been reviewed, patient examined, no change in status, stable for surgery.  I have reviewed the patient's chart and labs.  Questions were answered to the patient's satisfaction.     [employer]

## 2023-01-18 NOTE — Progress Notes (Signed)
*  PRELIMINARY RESULTS* Echocardiogram 2D Echocardiogram has been performed.  Joseph [name] 01/18/2023, 3:02 PM

## 2023-01-18 NOTE — Inpatient Diabetes Management (Signed)
Inpatient Diabetes Program Recommendations  AACE/ADA: New Consensus Statement on Inpatient Glycemic Control (2015)  Target Ranges:  Prepandial:   less than 140 mg/dL      Peak postprandial:   less than 180 mg/dL (1-2 hours)      Critically ill patients:  140 - 180 mg/dL   Lab Results  Component Value Date   GLUCAP 201 (H) 01/18/2023   HGBA1C 8.8 (H) 11/11/2019    Review of Glycemic Control  Latest Reference Range & Units 01/18/23 08:31  Glucose-Capillary 70 - 99 mg/dL 161 (H)  (H): Data is abnormally high Diabetes history: Type 2 DM Outpatient Diabetes medications: Tresiba 40-44 units QD Current orders for Inpatient glycemic control: Novolog 0-15 units TID, Novolog 2 units TID  Inpatient Diabetes Program Recommendations:    Consider adding Semglee 8 units every day.  Thanks, Lujean Rave, MSN, RNC-OB Diabetes Coordinator [phone] (8a-5p)

## 2023-01-18 NOTE — Anesthesia Postprocedure Evaluation (Signed)
Anesthesia Post Note  Patient: Joseph [name]  Procedure(s) Performed: ESOPHAGOGASTRODUODENOSCOPY (EGD) BIOPSY     Patient location during evaluation: PACU Anesthesia Type: MAC Level of consciousness: awake Pain management: pain level controlled Vital Signs Assessment: post-procedure vital signs reviewed and stable Respiratory status: spontaneous breathing, nonlabored ventilation and respiratory function stable Cardiovascular status: stable and blood pressure returned to baseline Postop Assessment: no apparent nausea or vomiting Anesthetic complications: no   No notable events documented.  Last Vitals:  Vitals:   01/18/23 0800 01/18/23 0832  BP: 100/66 129/86  Pulse: 74 (!) 57  Resp: (!) 33 16  Temp:  36.5 C  SpO2: 92% 95%    Last Pain:  Vitals:   01/18/23 0758  TempSrc:   PainSc: 0-No pain                 Linton Rump

## 2023-01-18 NOTE — TOC Progression Note (Signed)
Transition of Care [facility]) - Initial/Assessment Note    Patient Details  Name: Joseph [name] MRN: [id number] Date of Birth: [birth year]  [facility] of [facility]) CM/SW Contact:    Ralene Bathe, LCSW Phone Number: 01/18/2023, 3:18 PM  Clinical Narrative:                 CSW received consult for possible SNF placement at time of discharge. CSW spoke with patient and patient's daughter at bedside. Patient expressed understanding of PT recommendation and is agreeable to SNF placement at time of discharge. Patient reports preference for [facility], [facility], or [facility]. CSW discussed insurance authorization process and provided Medicare SNF ratings list. CSW will send out referrals for review and provide bed offers as available.   Skilled Nursing Rehab Facilities-   [email or web address]   Ratings out of 5 stars (5 the highest)   Name Address  Phone # Quality Care Staffing Health Inspection Overall  [facility] [address] [phone] 2 1 5 4   [facility] [address] [place] [phone] 4 1 3 2   [facility] [address], [facility] [phone] 2 1 1 1   [facility] [address], [place] [phone] 4 1 3 2   [facility]  [address], [place] [phone] 3 2 5 5   [facility] [address] [facility] [phone] 2 1 2 1   [facility] [address] [place] [phone] 4 1 2 1   [facility] [address], [place] [phone] 2 4 3 3   [address] ([place]) [address], [place] [phone] 3 2 2 2   [facility] [address] [place], [place] [phone] 1 2 1 1   [facility] ([place]) [address], [place] [phone] 3 1 1 1   [facility] [address] [place] [phone] 4 3 4 4   [facility] [address], [place] [phone] 3 4 3 3           [facility] [address] [place] [phone]      [facility] [place] [address], [place] [phone] 1 1 2 1   [facility] [address] [place] [phone] 2 2 4 4   [facility] [address], [place] [phone] 2 1 4 3   [facility] [address] [place] [phone] 3 3 3 3           [address] (no [facility]) [address], [place] [phone] 4 4 5 5   [facility] (No [employer]) [address] [place], [place] [phone] 2 2 4 4   [facility] [address] [place] [phone] 2 1 2 1   [facility] (No [employer]) [address], [place] [phone] 5 5 5 5   [facility] [address] [facility] [phone] 2 3 5 5   [facility] [address] [place] [phone] 2 1 1 1   [place] [address] [place] [phone] 5 2 5 5   [facility]  [address] [place] [phone] 2 2 2 2   [facility] [address], [place] [phone] 4 2 1 1   [facility] [address] [place], [place] [phone] 2 2 3 3           [facility] [address] [place] [phone] 1 1 1 1   [facility] [address] Evlyn Clines  [phone] 2 3 3 3   [facility] (No [employer]) [address] [place] [phone] 2 1 3  2  [facility]) [address], [place] [phone] 1 1 1 1   [facility] [place] [address] [place] Levan [phone] 3 2 5 5   [facility] [place] [address], [place] [phone] 2 1 1 1           [facility] [address] [place], [place] [phone] 4 4 5 5   [facility])  [address] [place] [phone] 2 1 2 1   [facility]) [address], [place] [phone]  1 4 3   [facility] [address] [place] [phone] 3 5 4 5   [address] [place], [place] [phone] 3 2 2 2   [facility]) [address] [place] [phone]  2 1 3 2           Patient Goals and CMS Choice            Expected Discharge Plan and Services                                              Prior Living Arrangements/Services                       Activities of Daily Living Home Assistive Devices/Equipment: Eyeglasses ADL Screening (condition at time of admission) Patient's cognitive ability adequate to safely complete daily activities?: Yes Is the patient deaf or have difficulty hearing?: Yes (speak louder) Does the patient have difficulty seeing, even when wearing glasses/contacts?: No Does the patient have difficulty concentrating, remembering, or making decisions?: Yes Patient able to express need for assistance with ADLs?: Yes Does the patient have difficulty dressing or bathing?: No Independently performs ADLs?: Yes (appropriate for developmental age) Communication:  (since tuesday he is unable to take care of himself) Does the patient have difficulty walking or climbing stairs?: Yes Weakness of Legs: Both Weakness of Arms/Hands: Both  Permission Sought/Granted                  Emotional Assessment              Admission diagnosis:  Hyponatremia [E87.1] Epigastric abdominal pain [R10.13] Pain in the abdomen [R10.9] AKI (acute kidney injury) (HCC) [N17.9] Abdominal pain [R10.9] Patient Active Problem List   Diagnosis Date Noted   Abdominal pain 01/17/2023   Pain in the abdomen 01/16/2023   Hyponatremia 01/16/2023   AKI (acute kidney injury) (HCC) 01/16/2023   Elevated troponin 01/16/2023   Overweight 01/16/2023   Thoracic ascending aortic aneurysm (HCC) 01/16/2023   SBO (small bowel obstruction) (HCC) 01/03/2023   CKD (chronic kidney disease), stage III (HCC) 01/02/2023   Small bowel obstruction (HCC) 01/02/2023   CAD in native artery 06/12/2019   Unstable angina (HCC) 05/17/2019   Lower GI bleed 12/26/2017   Postural dizziness with presyncope 12/26/2017   Coronary  artery disease of native artery of native heart with stable angina pectoris (HCC) 04/11/2017   Hyperlipidemia 04/11/2017   Anticoagulated 05/30/2016   Paroxysmal atrial fibrillation (HCC) 05/29/2016   Exertional angina (HCC) 08/02/2014   Bradycardia 02/02/2012   History of CVA (cerebrovascular accident) 08/01/2011   Leg pain 03/31/2011   Abdominal pain 10/13/2010   Hip pain 10/13/2010   Essential hypertension 09/09/2009   Peripheral vascular disease (HCC) 09/09/2009   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  CAD S/P percutaneous coronary angioplasty 09/08/2009   PCP:  Erskine Emery, NP Pharmacy:   [phone] [place] - [address] [place] [address] Phone: [phone] Fax: [phone]     Social Determinants of Health (SDOH) Social History: SDOH Screenings   Food Insecurity: No Food Insecurity (01/16/2023)  Housing: Low Risk  (01/16/2023)  Transportation Needs: No Transportation Needs (01/16/2023)  Utilities: Not At Risk (01/16/2023)  Tobacco Use: Low Risk  (01/18/2023)   SDOH Interventions: Transportation Interventions: Intervention Not Indicated, Inpatient TOC, Patient Resources (Friends/Family)   Readmission Risk Interventions    01/17/2023    3:00 PM  Readmission Risk Prevention Plan  Transportation Screening Complete  PCP or Specialist Appt within 3-5 Days Complete  HRI or Home Care Consult Complete  Palliative Care Screening Complete  Medication Review (RN Care Manager) Referral to Pharmacy

## 2023-01-18 NOTE — Transfer of Care (Signed)
Immediate Anesthesia Transfer of Care Note  Patient: Joseph [name]  Procedure(s) Performed: ESOPHAGOGASTRODUODENOSCOPY (EGD) BIOPSY  Patient Location: PACU and Endoscopy Unit  Anesthesia Type:MAC  Level of Consciousness: drowsy  Airway & Oxygen Therapy: Patient Spontanous Breathing and Patient connected to nasal cannula oxygen  Post-op Assessment: Report given to RN and Post -op Vital signs reviewed and stable  Post vital signs: Reviewed and stable  Last Vitals:  Vitals Value Taken Time  BP 98/59 01/18/23 0748  Temp    Pulse 74 01/18/23 0749  Resp 25 01/18/23 0749  SpO2 93 % 01/18/23 0749  Vitals shown include unvalidated device data.  Last Pain:  Vitals:   01/18/23 0710  TempSrc: Temporal  PainSc:       Patients Stated Pain Goal: 0 (01/16/23 1500)  Complications: No notable events documented.

## 2023-01-18 NOTE — Plan of Care (Signed)
  Problem: Clinical Measurements: Goal: Diagnostic test results will improve Outcome: Not Progressing   Problem: Pain Managment: Goal: General experience of comfort will improve Outcome: Not Progressing   

## 2023-01-19 LAB — CBC
HCT: 36 % — ABNORMAL LOW (ref 39.0–52.0)
Hemoglobin: 12.8 g/dL — ABNORMAL LOW (ref 13.0–17.0)
MCH: 31.4 pg (ref 26.0–34.0)
MCHC: 35.6 g/dL (ref 30.0–36.0)
MCV: 88.5 fL (ref 80.0–100.0)
Platelets: 228 10*3/uL (ref 150–400)
RBC: 4.07 MIL/uL — ABNORMAL LOW (ref 4.22–5.81)
RDW: 12.4 % (ref 11.5–15.5)
WBC: 5.5 10*3/uL (ref 4.0–10.5)
nRBC: 0 % (ref 0.0–0.2)

## 2023-01-19 LAB — BASIC METABOLIC PANEL
Anion gap: 11 (ref 5–15)
BUN: 19 mg/dL (ref 8–23)
CO2: 22 mmol/L (ref 22–32)
Calcium: 8.4 mg/dL — ABNORMAL LOW (ref 8.9–10.3)
Chloride: 98 mmol/L (ref 98–111)
Creatinine, Ser: 1.32 mg/dL — ABNORMAL HIGH (ref 0.61–1.24)
GFR, Estimated: 53 mL/min — ABNORMAL LOW (ref >60)
Glucose, Bld: 185 mg/dL — ABNORMAL HIGH (ref 70–99)
Potassium: 3.7 mmol/L (ref 3.5–5.1)
Sodium: 131 mmol/L — ABNORMAL LOW (ref 135–145)

## 2023-01-19 LAB — MAGNESIUM: Magnesium: 2 mg/dL (ref 1.7–2.4)

## 2023-01-19 LAB — GLUCOSE, CAPILLARY
Glucose-Capillary: 190 mg/dL — ABNORMAL HIGH (ref 70–99)
Glucose-Capillary: 258 mg/dL — ABNORMAL HIGH (ref 70–99)
Glucose-Capillary: 200 mg/dL — ABNORMAL HIGH (ref 70–99)
Glucose-Capillary: 225 mg/dL — ABNORMAL HIGH (ref 70–99)

## 2023-01-19 MED ORDER — INSULIN GLARGINE-YFGN 100 UNIT/ML ~~LOC~~ SOLN
5.0000 [IU] | Freq: Every day | SUBCUTANEOUS | Status: DC
  Administered 2023-01-19 – 2023-01-21 (×3): 5 [IU] via SUBCUTANEOUS
  Filled 2023-01-19 (×3): qty 0.05

## 2023-01-19 MED ORDER — SULFAMETHOXAZOLE-TRIMETHOPRIM 800-160 MG PO TABS
1.0000 | ORAL_TABLET | Freq: Two times a day (BID) | ORAL | Status: DC
  Administered 2023-01-19 – 2023-01-20 (×4): 1 via ORAL
  Filled 2023-01-19 (×5): qty 1

## 2023-01-19 MED ORDER — INSULIN ASPART 100 UNIT/ML IJ SOLN
3.0000 [IU] | Freq: Three times a day (TID) | INTRAMUSCULAR | Status: DC
  Administered 2023-01-19 – 2023-01-21 (×7): 3 [IU] via SUBCUTANEOUS

## 2023-01-19 NOTE — Evaluation (Signed)
Occupational Therapy Evaluation Patient Details Name: Joseph [name] MRN: [id number] DOB: 1941/03/26 Today's Date: 01/19/2023   History of Present Illness 82 yo male with recent partial SBO which resolved with conservative measures. Now readmitted with recurrent nausea and generalized upper abdominal pain. Underwent EGD and revealed duodenitis/duodenal ulcer PMH: CAD / stenting, PAF on Eliquis, CKd 3, kidney stones, CVA, DM,  diverticulosis, partial small bowel obstruction, colon polyps, post-polypectomy bleed   Clinical Impression   Pt admitted with the above diagnosis. Pt currently with functional limitations due to the deficits listed below (see OT Problem List). Prior to admit, pt was living alone independent with all ADL tasks, functional mobility including driving. Pt will benefit from acute skilled OT to increase their safety and independence with ADL and functional mobility for ADL to facilitate discharge. Patient will benefit from continued inpatient follow up therapy, <3 hours/day. OT will continue to follow patient acutely         Recommendations for follow up therapy are one component of a multi-disciplinary discharge planning process, led by the attending physician.  Recommendations may be updated based on patient status, additional functional criteria and insurance authorization.   Assistance Recommended at Discharge Set up Supervision/Assistance  Patient can return home with the following A little help with walking and/or transfers;A little help with bathing/dressing/bathroom;Help with stairs or ramp for entrance;Assistance with cooking/housework;Assist for transportation    Functional Status Assessment  Patient has had a recent decline in their functional status and demonstrates the ability to make significant improvements in function in a reasonable and predictable amount of time.  Equipment Recommendations  Other (comment) (defer to next venue of care)       Precautions /  Restrictions Precautions Precautions: Fall Restrictions Weight Bearing Restrictions: No      Mobility Bed Mobility Overal bed mobility: Modified Independent             General bed mobility comments: HOB elevated to simulate recliner. Pt sleeps in recliner at baseline Patient Response: Cooperative  Transfers Overall transfer level: Needs assistance Equipment used: Rolling walker (2 wheels) Transfers: Sit to/from Stand, Bed to chair/wheelchair/BSC Sit to Stand: Supervision     Step pivot transfers: Min guard     General transfer comment: VC for hand placement with RW management when completing sit<>stand transition.      Balance Overall balance assessment: Needs assistance Sitting-balance support: Feet supported, No upper extremity supported Sitting balance-Leahy Scale: Good Sitting balance - Comments: sitting EOB during sock management   Standing balance support: During functional activity, Reliant on assistive device for balance, Bilateral upper extremity supported Standing balance-Leahy Scale: Fair           ADL either performed or assessed with clinical judgement   ADL Overall ADL's : Needs assistance/impaired Eating/Feeding: Independent;Bed level   Grooming: Wash/dry face;Wash/dry hands;Applying deodorant;Set up;Sitting   Upper Body Bathing: Set up;Sitting   Lower Body Bathing: Min guard;Sitting/lateral leans;Sit to/from stand   Upper Body Dressing : Set up;Sitting   Lower Body Dressing: Supervision/safety;Sit to/from stand;Sitting/lateral leans   Toilet Transfer: Min guard;Ambulation;Rolling walker (2 wheels) Toilet Transfer Details (indicate cue type and reason): simulated to recliner Toileting- Clothing Manipulation and Hygiene: Min guard;Sit to/from stand;Sitting/lateral lean         Vision Baseline Vision/History: 1 Wears glasses (reading) Ability to See in Adequate Light: 0 Adequate Patient Visual Report: No change from baseline Vision  Assessment?: No apparent visual deficits            Pertinent  Vitals/Pain Pain Assessment Pain Assessment: No/denies pain     Hand Dominance Right   Extremity/Trunk Assessment Upper Extremity Assessment Upper Extremity Assessment: LUE deficits/detail (RUE full ROM and 5/5 in all ranges/joints.) LUE Deficits / Details: baseline RTC issues with limited ROM. Pt able to demonstrate shoulder flexion/abduction to ~90*. strength: 3-/5 shoulder flexion/abduction/er, 5/5: shoulder ER, bicep flexion/extension. good gross grasp.   Lower Extremity Assessment Lower Extremity Assessment: Defer to PT evaluation   Cervical / Trunk Assessment Cervical / Trunk Assessment: Other exceptions Cervical / Trunk Exceptions: forward head and rounded shoulders   Communication Communication Communication: HOH   Cognition Arousal/Alertness: Awake/alert Behavior During Therapy: WFL for tasks assessed/performed Overall Cognitive Status: Within Functional Limits for tasks assessed         General Comments  Noted yellow pus-like on pt's pillow upon sitting EOB with source noted from back of head. (possible abscess?) Notified nursing after session.            Home Living Family/patient expects to be discharged to:: Private residence Living Arrangements: Alone Available Help at Discharge: Friend(s);Neighbor;Available PRN/intermittently Type of Home: House Home Access: Stairs to enter [employer] of Steps: 3 Entrance Stairs-Rails: Right;Left Home Layout: One level     Bathroom Shower/Tub: Walk-in Pensions consultant: Handicapped height Bathroom Accessibility: Yes How Accessible: Accessible via walker Home Equipment: Shower seat;Grab bars - toilet   Additional Comments: Pt sleeps in recliner at baseline      Prior Functioning/Environment Prior Level of Function : Independent/Modified Independent;Driving             Mobility Comments: independent, no AD ADLs  Comments: inependent        OT Problem List: Decreased strength;Decreased activity tolerance;Impaired balance (sitting and/or standing)      OT Treatment/Interventions: Self-care/ADL training;Therapeutic exercise;Therapeutic activities;Energy conservation;DME and/or AE instruction;Patient/family education;Balance training    OT Goals(Current goals can be found in the care plan section) Acute Rehab OT Goals Patient Stated Goal: to get out of bed OT Goal Formulation: Patient unable to participate in goal setting Time For Goal Achievement: 02/02/23 Potential to Achieve Goals: Good  OT Frequency: Min 2X/week       AM-PAC OT "6 Clicks" Daily Activity     Outcome Measure Help from another person eating meals?: None Help from another person taking care of personal grooming?: A Little Help from another person toileting, which includes using toliet, bedpan, or urinal?: A Little Help from another person bathing (including washing, rinsing, drying)?: A Little Help from another person to put on and taking off regular upper body clothing?: A Little Help from another person to put on and taking off regular lower body clothing?: A Little 6 Click Score: 19   End of Session Equipment Utilized During Treatment: Gait belt;Rolling walker (2 wheels) Nurse Communication: Mobility status;Other (comment) (drainage from back of head)  Activity Tolerance: Patient tolerated treatment well Patient left: in chair;with call bell/phone within reach;with family/visitor present  OT Visit Diagnosis: Unsteadiness on feet (R26.81);Muscle weakness (generalized) (M62.81)                Time: 1610-9604 OT Time Calculation (min): 30 min Charges:  OT General Charges $OT Visit: 1 Visit OT Evaluation $OT Eval Moderate Complexity: 1 Mod OT Treatments $Self Care/Home Management : 8-22 mins  Limmie Patricia, OTR/L,CBIS  Supplemental OT - [facility] and [facility] Secure Chat Preferred    Akhilesh Sassone, Charisse March 01/19/2023, 9:57  AM

## 2023-01-19 NOTE — Progress Notes (Addendum)
PROGRESS NOTE    Joseph [name]  ZOX:[id number] DOB: 1940/11/25 DOA: 01/16/2023 PCP: Erskine Emery, NP   Brief Narrative:   82 y.o. male with medical history significant of CAD, stent to LAD and PCI to RCA in 2004, DES to LAD and mid and distal Circumflex, PAF on Eliquis, T2DM, HTN, HLD intolerant to statins, CKD stage III, CVA, who presented with nausea and abdominal pain.  Patient was admitted to the hospital about 2 weeks ago for small bowel obstruction treated with NG tube and conservative measures and eventually discharged home.  After going home patient started developing intermittent nausea and vomiting for 5 days prior to admission.  In between came to the ED on 6/2 7, CT abdomen pelvis showed panniculitis and was discharged home again.  Upon this admission noted to have AKI, hyponatremia with clinical signs of dehydration.  Right upper quadrant ultrasound showed hepatic steatosis, CT abdomen pelvis showed 5 mm bladder stone otherwise no acute pathology.  CTA was also negative.  Eventually due to abdominal symptoms, GI was consulted and patient underwent endoscopy which showed duodenitis/duodenal ulcer started on PPI and Carafate.  Echocardiogram showed EF of 60%.  PT/OT recommended SNF.  TOC working on placement.   Assessment & Plan:  Principal Problem:   Pain in the abdomen Active Problems:   Paroxysmal atrial fibrillation (HCC)   Essential hypertension   Type 2 diabetes mellitus with vascular disease (HCC)   CKD (chronic kidney disease), stage III (HCC)   History of CVA (cerebrovascular accident)   Hyperlipidemia   CAD S/P percutaneous coronary angioplasty   Peripheral vascular disease (HCC)   Anticoagulated   CAD in native artery   Hyponatremia   AKI (acute kidney injury) (HCC)   Elevated troponin   Overweight   Thoracic ascending aortic aneurysm (HCC)   Abdominal pain    Intermittent nausea vomiting with abdominal pain Duodenitis/duodenal ulcer Duodenal polyp - Seen  by [facility].  Status post endoscopy 7/2.  Showed duodenal ulcer/duodenitis duodenal polyp which was biopsied.  For now recommending PPI twice daily and Carafate and outpatient follow-up.  Recent small bowel obstruction History of peripheral arterial disease Transaminitis - No evidence of obstruction during this visit.  Seen by GI. -Transaminitis slowly improving.  Lipase negative.  Hyponatremia/acute kidney injury CKD stage IIIa - Baseline creatinine 1.2, admission creatinine 1.92.  Improved, today 1.32  Abnormal BS Improved today  Paroxysmal atrial fibrillation on Eliquis History of coronary artery disease status post stent - Continue Eliquis.  Continue Coreg, Imdur, Plavix  Diabetes mellitus type 2, uncontrolled secondary to hyperglycemia - A1c 7.5.  Sliding scale and Accu-Cheks -Add Semglee 5 units daily.  NovoLog 3 units Premeal.  Essential hypertension - Coreg, Imdur.  IV as needed  Hyperlipidemia - Intolerant to statins  History of CVA - On Eliquis  Reports of his scalp infection for which he was on Bactrim outpatient, took 2 days of it. Will prescribe 7 more days. Overnight had some drainage but appears stable this morning.   PT/OT-SNF.  TOC working on placement  DVT prophylaxis: Eliquis Code Status: Full  Family Communication: Daughter updated at bedside [facility] working on SNF placement     Diet Orders (From admission, onward)     Start     Ordered   01/18/23 0826  Diet Heart Room service appropriate? Yes; Fluid consistency: Thin  Diet effective now       Question Answer Comment  Room service appropriate? Yes   Fluid consistency: Thin  01/18/23 0825            Subjective: Sitting up in the chair, feels well today.  No SOB  Examination: Constitutional: Not in acute distress Respiratory: Clear to auscultation bilaterally Cardiovascular: Normal sinus rhythm, no rubs Abdomen: Nontender nondistended good bowel sounds Musculoskeletal: No edema  noted Skin: No rashes seen Neurologic: CN 2-12 grossly intact.  And nonfocal Psychiatric: Normal judgment and insight. Alert and oriented x 3. Normal mood.     Objective: Vitals:   01/18/23 1322 01/18/23 1642 01/18/23 2111 01/19/23 0513  BP:  115/68 (!) 102/59 126/81  Pulse: 90 92 84 73  Resp: 20 18 18 16   Temp:  98.2 F (36.8 C) 98.3 F (36.8 C) 97.9 F (36.6 C)  TempSrc:  Oral Oral   SpO2:  94% 96% 96%  Weight:      Height:        Intake/Output Summary (Last 24 hours) at 01/19/2023 0900 Last data filed at 01/19/2023 0403 Gross per 24 hour  Intake 750 ml  Output 2080 ml  Net -1330 ml   Filed Weights   01/16/23 0550 01/16/23 1305  Weight: 97.1 kg 97.5 kg    Scheduled Meds:  apixaban  5 mg Oral BID   carvedilol  6.25 mg Oral BID WC   clopidogrel  75 mg Oral Daily   insulin aspart  0-15 Units Subcutaneous TID WC   insulin aspart  2 Units Subcutaneous TID WC   isosorbide mononitrate  30 mg Oral QPM   pantoprazole  40 mg Oral BID AC   sucralfate  1 g Oral BID   Continuous Infusions:    Nutritional status     Body mass index is 27.6 kg/m.  Data Reviewed:   CBC: Recent Labs  Lab 01/13/23 1400 01/16/23 0555 01/17/23 0137 01/18/23 0240 01/19/23 0325  WBC 15.4* 6.2 4.3 5.4 5.5  NEUTROABS 14.8* 5.5  --   --   --   HGB 13.8 13.1 11.3* 12.4* 12.8*  HCT 40.1 38.0* 33.1* 35.6* 36.0*  MCV 89.5 89.8 90.2 87.9 88.5  PLT 251 209 182 207 228   Basic Metabolic Panel: Recent Labs  Lab 01/13/23 1400 01/16/23 0555 01/17/23 0137 01/18/23 0240 01/19/23 0325  NA 133* 128* 129* 127* 131*  K 4.3 4.1 4.2 3.8 3.7  CL 102 96* 100 97* 98  CO2 21* 21* 20* 22 22  GLUCOSE 165* 158* 148* 193* 185*  BUN 23 39* 29* 20 19  CREATININE 1.28* 1.92* 1.66* 1.33* 1.32*  CALCIUM 9.3 8.5* 8.0* 8.2* 8.4*  MG  --  2.1  --  2.0 2.0  PHOS  --   --   --  2.4*  --    GFR: Estimated Creatinine Clearance: 48.4 mL/min (A) (by C-G formula based on SCr of 1.32 mg/dL (H)). Liver  Function Tests: Recent Labs  Lab 01/13/23 1400 01/16/23 0555 01/17/23 0137  AST 19 88* 48*  ALT 24 88* 62*  ALKPHOS 53 121 102  BILITOT 1.2 1.1 0.9  PROT 6.0* 5.7* 4.8*  ALBUMIN 3.6 3.1* 2.5*   Recent Labs  Lab 01/13/23 1400 01/16/23 0555  LIPASE 27 40   No results for input(s): "AMMONIA" in the last 168 hours. Coagulation Profile: No results for input(s): "INR", "PROTIME" in the last 168 hours. Cardiac Enzymes: No results for input(s): "CKTOTAL", "CKMB", "CKMBINDEX", "TROPONINI" in the last 168 hours. BNP (last 3 results) No results for input(s): "PROBNP" in the last 8760 hours. HbA1C: No results for input(s): "  HGBA1C" in the last 72 hours. CBG: Recent Labs  Lab 01/18/23 0831 01/18/23 1121 01/18/23 1645 01/18/23 2112 01/19/23 0724  GLUCAP 201* 190* 295* 216* 190*   Lipid Profile: No results for input(s): "CHOL", "HDL", "LDLCALC", "TRIG", "CHOLHDL", "LDLDIRECT" in the last 72 hours. Thyroid Function Tests: No results for input(s): "TSH", "T4TOTAL", "FREET4", "T3FREE", "THYROIDAB" in the last 72 hours. Anemia Panel: No results for input(s): "VITAMINB12", "FOLATE", "FERRITIN", "TIBC", "IRON", "RETICCTPCT" in the last 72 hours. Sepsis Labs: Recent Labs  Lab 01/16/23 1353 01/16/23 1705 01/16/23 2028  LATICACIDVEN 1.6 2.4* 1.2    Recent Results (from the past 240 hour(s))  SARS Coronavirus 2 by RT PCR (hospital order, performed in [facility] hospital lab) *cepheid single result test* Anterior Nasal Swab     Status: None   Collection Time: 01/16/23 12:02 PM   Specimen: Anterior Nasal Swab  Result Value Ref Range Status   SARS Coronavirus 2 by RT PCR NEGATIVE NEGATIVE Final    Comment: Performed at [facility], [address], [place], [place] [address]         Radiology Studies: ECHOCARDIOGRAM COMPLETE  Result Date: 01/18/2023    ECHOCARDIOGRAM REPORT   Patient Name:   Joseph [name] Date of Exam: 01/18/2023 Medical Rec #:  [id number]      Height:        74.0 in Accession #:    [id number]     Weight:       214.9 lb Date of Birth:  05-26-1941      BSA:          2.241 m Patient Age:    82 years       BP:           129/86 mmHg Patient Gender: M              HR:           93 bpm. Exam Location:  Inpatient Procedure: 2D Echo, Cardiac Doppler and Color Doppler Indications:    Cardiomyopathy-Unspecified I42.9  History:        Patient has prior history of Echocardiogram examinations, most                 recent 10/28/2020. CAD, CKD 3, PAD and Stroke; Risk                 Factors:Hypertension and Non-Smoker.  Sonographer:    Dondra Prader RVT RCS Referring Phys: [id number] Helina Hullum CHIRAG Geronimo Diliberto IMPRESSIONS  1. Left ventricular ejection fraction, by estimation, is 55 to 60%. The left ventricle has normal function. The left ventricle has no regional wall motion abnormalities. There is mild left ventricular hypertrophy. Left ventricular diastolic parameters are indeterminate.  2. Right ventricular systolic function is normal. The right ventricular size is normal.  3. Left atrial size was moderately dilated.  4. The mitral valve is normal in structure. No evidence of mitral valve regurgitation. No evidence of mitral stenosis.  5. The aortic valve is tricuspid. There is moderate calcification of the aortic valve. There is moderate thickening of the aortic valve. Aortic valve regurgitation is not visualized. Aortic valve sclerosis/calcification is present, without any evidence of aortic stenosis.  6. Aortic dilatation noted. There is mild dilatation of the aortic root, measuring 40 mm.  7. The inferior vena cava is normal in size with greater than 50% respiratory variability, suggesting right atrial pressure of 3 mmHg. FINDINGS  Left Ventricle: Left ventricular ejection fraction, by estimation, is 55  to 60%. The left ventricle has normal function. The left ventricle has no regional wall motion abnormalities. The left ventricular internal cavity size was normal in size. There is  mild  left ventricular hypertrophy. Left ventricular diastolic parameters are indeterminate. Right Ventricle: The right ventricular size is normal. No increase in right ventricular wall thickness. Right ventricular systolic function is normal. Left Atrium: Left atrial size was moderately dilated. Right Atrium: Right atrial size was normal in size. Pericardium: Trivial pericardial effusion is present. The pericardial effusion is posterior to the left ventricle. Mitral Valve: The mitral valve is normal in structure. No evidence of mitral valve regurgitation. No evidence of mitral valve stenosis. Tricuspid Valve: The tricuspid valve is normal in structure. Tricuspid valve regurgitation is not demonstrated. No evidence of tricuspid stenosis. Aortic Valve: The aortic valve is tricuspid. There is moderate calcification of the aortic valve. There is moderate thickening of the aortic valve. Aortic valve regurgitation is not visualized. Aortic valve sclerosis/calcification is present, without any  evidence of aortic stenosis. Aortic valve mean gradient measures 3.0 mmHg. Aortic valve peak gradient measures 6.6 mmHg. Aortic valve area, by VTI measures 2.48 cm. Pulmonic Valve: The pulmonic valve was normal in structure. Pulmonic valve regurgitation is trivial. No evidence of pulmonic stenosis. Aorta: Aortic dilatation noted. There is mild dilatation of the aortic root, measuring 40 mm. Venous: The inferior vena cava is normal in size with greater than 50% respiratory variability, suggesting right atrial pressure of 3 mmHg. IAS/Shunts: The interatrial septum was not well visualized.  LEFT VENTRICLE PLAX 2D LVIDd:         4.70 cm LVIDs:         3.50 cm LV PW:         1.20 cm LV IVS:        1.20 cm LVOT diam:     2.20 cm LV SV:         50 LV SV Index:   22 LVOT Area:     3.80 cm  RIGHT VENTRICLE RV S prime:     13.70 cm/s TAPSE (M-mode): 1.6 cm LEFT ATRIUM             Index        RIGHT ATRIUM          Index LA diam:        4.20 cm  1.87 cm/m   RA Area:     7.46 cm LA Vol (A2C):   75.4 ml 33.64 ml/m  RA Volume:   12.40 ml 5.53 ml/m LA Vol (A4C):   69.6 ml 31.06 ml/m LA Biplane Vol: 74.2 ml 33.11 ml/m  AORTIC VALVE AV Area (Vmax):    2.51 cm AV Area (Vmean):   2.41 cm AV Area (VTI):     2.48 cm AV Vmax:           128.00 cm/s AV Vmean:          83.800 cm/s AV VTI:            0.202 m AV Peak Grad:      6.6 mmHg AV Mean Grad:      3.0 mmHg LVOT Vmax:         84.40 cm/s LVOT Vmean:        53.100 cm/s LVOT VTI:          0.132 m LVOT/AV VTI ratio: 0.65  AORTA Ao Root diam: 4.00 cm Ao Asc diam:  3.60 cm MITRAL VALVE  TRICUSPID VALVE MV Area (PHT): 3.72 cm    TR Peak grad:   20.2 mmHg MV Decel Time: 204 msec    TR Vmax:        225.00 cm/s MV E velocity: 95.10 cm/s                            SHUNTS                            Systemic VTI:  0.13 m                            Systemic Diam: 2.20 cm Charlton Haws MD Electronically signed by Charlton Haws MD Signature Date/Time: 01/18/2023/3:56:30 PM    Final    VAS [place] MESENTERIC  Result Date: 01/17/2023 ABDOMINAL VISCERAL Patient Name:  Joseph [name]  Date of Exam:   01/17/2023 Medical Rec #: [id number]       Accession #:    [id number] Date of Birth: [birth year]       Patient Gender: M Patient Age:   50 years Exam Location:  [facility] Procedure:      VAS [place] MESENTERIC Referring Phys: [id number] NA LI -------------------------------------------------------------------------------- Indications: Intermittent nausea and abdominal pain. History of small bowel              obstruction in 6/24. High Risk Factors: Hypertension, hyperlipidemia, coronary artery disease. Limitations: Air/bowel gas. Performing Technologist: Marilynne Halsted RDMS, RVT  Examination Guidelines: A complete evaluation includes B-mode imaging, spectral Doppler, color Doppler, and power Doppler as needed of all accessible portions of each vessel. Bilateral testing is considered an integral part of a complete examination.  Limited examinations for reoccurring indications may be performed as noted.  Duplex Findings: +--------------------+--------+--------+------+----------------------+ Mesenteric          PSV cm/sEDV cm/sPlaque       Comments        +--------------------+--------+--------+------+----------------------+ Aorta Prox             90                                        +--------------------+--------+--------+------+----------------------+ Celiac Artery Origin  222                                        +--------------------+--------+--------+------+----------------------+ SMA Proximal          295                                        +--------------------+--------+--------+------+----------------------+ SMA Mid               124                                        +--------------------+--------+--------+------+----------------------+ SMA Distal            141                                        +--------------------+--------+--------+------+----------------------+  CHA                                       not clearly visualized +--------------------+--------+--------+------+----------------------+ Splenic                                   not clearly visualized +--------------------+--------+--------+------+----------------------+ IMA                   198                                        +--------------------+--------+--------+------+----------------------+    Summary: Mesenteric:  Patent with mildly elevated velocities without significant stenosis.  *See table(s) above for measurements and observations.  Diagnosing physician: Gerarda Fraction  Electronically signed by Gerarda Fraction on 01/17/2023 at 3:13:33 PM.    Final            LOS: 2 days   Time spent= 35 mins    Marthe Dant Joline Maxcy, MD [facility]  If 7PM-7AM, please contact night-coverage  01/19/2023, 9:00 AM

## 2023-01-19 NOTE — Progress Notes (Signed)
Physical Therapy Treatment Patient Details Name: Joseph [name] MRN: [id number] DOB: [birth year] Today's Date: 01/19/2023   History of Present Illness 82 yo male with recent partial SBO which resolved with conservative measures. Now readmitted with recurrent nausea and generalized upper abdominal pain. Underwent EGD and revealed duodenitis/duodenal ulcer PMH: CAD / stenting, PAF on Eliquis, CKd 3, kidney stones, CVA, DM,  diverticulosis, partial small bowel obstruction, colon polyps, post-polypectomy bleed.    PT Comments  Pt received in recliner, agreeable to therapy session and with good participation and improved tolerance for transfer and gait training with RW this date. Pt VSS on RA with exertion this date, pt heavily reliant on RW but able to perform household distance gait trial with up to minA for stability/RW management. Pt continues to benefit from PT services to progress toward functional mobility goals.      Assistance Recommended at Discharge Frequent or constant Supervision/Assistance  If plan is discharge home, recommend the following:  Can travel by private vehicle    A little help with walking and/or transfers;A little help with bathing/dressing/bathroom;Assistance with cooking/housework;Assist for transportation;Help with stairs or ramp for entrance   Yes  Equipment Recommendations  Rolling walker (2 wheels)    Recommendations for Other Services       Precautions / Restrictions Precautions Precautions: Fall Restrictions Weight Bearing Restrictions: No     Mobility  Bed Mobility               General bed mobility comments: Pt received in chair. Pt sleeps in recliner at baseline.    Transfers Overall transfer level: Needs assistance Equipment used: Rolling walker (2 wheels) Transfers: Sit to/from Stand, Bed to chair/wheelchair/BSC Sit to Stand: Min assist           General transfer comment: VC for hand placement with RW management when completing  sit<>stand transition. MinA from lower chair height<>RW, min guard for stand>sit with cues.    Ambulation/Gait Ambulation/Gait assistance: Min assist, Min guard Gait Distance (Feet): 100 Feet (56ft x2 with standing rest break) Assistive device: Rolling walker (2 wheels) Gait Pattern/deviations: Decreased stride length, Step-to pattern, Trunk flexed, Narrow base of support Gait velocity: decreased, grossly <0.4 m/s     General Gait Details: HR 70's bpm, SpO2 96-98% on RA with exertion, no significant dyspnea, pt states 1/4 DOE "if any". BP 117/67 prior to standing (in chair) and no dizziness reported with standing.   Stairs Stairs:  (pt defers)           Wheelchair Mobility     Tilt Bed    Modified Rankin (Stroke Patients Only)       Balance Overall balance assessment: Needs assistance Sitting-balance support: Feet supported, No upper extremity supported   Sitting balance - Comments: sitting in recliner pre/post gait   Standing balance support: During functional activity, Reliant on assistive device for balance, Bilateral upper extremity supported Standing balance-Leahy Scale: Fair Standing balance comment: fair with RW, heavy reliance on RW support                            Cognition Arousal/Alertness: Awake/alert Behavior During Therapy: WFL for tasks assessed/performed, Flat affect Overall Cognitive Status: Within Functional Limits for tasks assessed  Exercises Other Exercises Other Exercises: reviewed supine BLE AROM: ankle pumps, SLR, hip abduction, pt performs ~5 reps ea for teachback. Encouraged IS hourly, pt pulling ~1250 mL x10 reps    General Comments General comments (skin integrity, edema, etc.): SpO2/BP WFL prior to amb and SpO2 WFL mid-trial      Pertinent Vitals/Pain Pain Assessment Pain Assessment: No/denies pain Pain Intervention(s): Monitored during session, Repositioned      PT Goals (current goals can now be found in the care plan section) Acute Rehab PT Goals Patient Stated Goal: To get stronger PT Goal Formulation: With patient/family Time For Goal Achievement: 02/01/23 Progress towards PT goals: Progressing toward goals    Frequency    Min 3X/week      PT Plan Current plan remains appropriate       AM-PAC PT "6 Clicks" Mobility   Outcome Measure  Help needed turning from your back to your side while in a flat bed without using bedrails?: A Little Help needed moving from lying on your back to sitting on the side of a flat bed without using bedrails?: A Lot (without rail; pt sleeps in recliner) Help needed moving to and from a bed to a chair (including a wheelchair)?: A Little Help needed standing up from a chair using your arms (e.g., wheelchair or bedside chair)?: A Little Help needed to walk in hospital room?: A Little Help needed climbing 3-5 steps with a railing? : Total 6 Click Score: 15    End of Session Equipment Utilized During Treatment: Gait belt Activity Tolerance: Patient tolerated treatment well Patient left: in chair;with call bell/phone within reach;with chair alarm set Nurse Communication: Mobility status PT Visit Diagnosis: Unsteadiness on feet (R26.81);Muscle weakness (generalized) (M62.81);Difficulty in walking, not elsewhere classified (R26.2)     Time: 1610-9604 PT Time Calculation (min) (ACUTE ONLY): 21 min  Charges:    $Gait Training: 8-22 mins PT General Charges $$ ACUTE PT VISIT: 1 Visit                     Naser Schuld P., PTA Acute Rehabilitation Services Secure Chat Preferred 9a-5:30pm Office: [phone]    Dorathy Kinsman [facility] 01/19/2023, 7:06 PM

## 2023-01-19 NOTE — TOC Progression Note (Addendum)
Transition of Care [facility]) - Initial/Assessment Note    Patient Details  Name: Joseph [name] MRN: [id number] Date of Birth: 1941-06-18  [facility]) CM/SW Contact:    Ralene Bathe, LCSW Phone Number: 01/19/2023, 3:06 PM  Clinical Narrative:                 15:30-  LCSW met with the patient at bedside and provided an update.  The patient requested that LCSW send the SNF referral to [facility] to review.  LCSW sent referral and called [facility] to inquire as to whether they could accept the patient and is awaiting a returned call.   The patient does not have any bed offers at this time.  Patient's daughter updated.    TOC following.        Patient Goals and CMS Choice            Expected Discharge Plan and Services                                              Prior Living Arrangements/Services                       Activities of Daily Living Home Assistive Devices/Equipment: Eyeglasses ADL Screening (condition at time of admission) Patient's cognitive ability adequate to safely complete daily activities?: Yes Is the patient deaf or have difficulty hearing?: Yes (speak louder) Does the patient have difficulty seeing, even when wearing glasses/contacts?: No Does the patient have difficulty concentrating, remembering, or making decisions?: Yes Patient able to express need for assistance with ADLs?: Yes Does the patient have difficulty dressing or bathing?: No Independently performs ADLs?: Yes (appropriate for developmental age) Communication:  (since tuesday he is unable to take care of himself) Does the patient have difficulty walking or climbing stairs?: Yes Weakness of Legs: Both Weakness of Arms/Hands: Both  Permission Sought/Granted                  Emotional Assessment              Admission diagnosis:  Hyponatremia [E87.1] Epigastric abdominal pain [R10.13] Pain in the abdomen [R10.9] AKI (acute  kidney injury) (HCC) [N17.9] Abdominal pain [R10.9] Patient Active Problem List   Diagnosis Date Noted   Abdominal pain 01/17/2023   Pain in the abdomen 01/16/2023   Hyponatremia 01/16/2023   AKI (acute kidney injury) (HCC) 01/16/2023   Elevated troponin 01/16/2023   Overweight 01/16/2023   Thoracic ascending aortic aneurysm (HCC) 01/16/2023   SBO (small bowel obstruction) (HCC) 01/03/2023   CKD (chronic kidney disease), stage III (HCC) 01/02/2023   Small bowel obstruction (HCC) 01/02/2023   CAD in native artery 06/12/2019   Unstable angina (HCC) 05/17/2019   Lower GI bleed 12/26/2017   Postural dizziness with presyncope 12/26/2017   Coronary artery disease of native artery of native heart with stable angina pectoris (HCC) 04/11/2017   Hyperlipidemia 04/11/2017   Anticoagulated 05/30/2016   Paroxysmal atrial fibrillation (HCC) 05/29/2016   Exertional angina (HCC) 08/02/2014   Bradycardia 02/02/2012   History of CVA (cerebrovascular accident) 08/01/2011   Leg pain 03/31/2011   Abdominal pain 10/13/2010   Hip pain 10/13/2010   Essential hypertension 09/09/2009   Peripheral vascular disease (HCC) 09/09/2009   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   CAD S/P  percutaneous coronary angioplasty 09/08/2009   PCP:  Erskine Emery, NP Pharmacy:   [phone] - [place], [place] - [address] [place] [address] Phone: [phone] Fax: [phone]     Social Determinants of Health (SDOH) Social History: SDOH Screenings   Food Insecurity: No Food Insecurity (01/16/2023)  Housing: Low Risk  (01/16/2023)  Transportation Needs: No Transportation Needs (01/16/2023)  Utilities: Not At Risk (01/16/2023)  Tobacco Use: Low Risk  (01/18/2023)   SDOH Interventions: Transportation Interventions: Intervention Not Indicated, Inpatient TOC, Patient Resources (Friends/Family)   Readmission Risk Interventions    01/17/2023    3:00 PM   Readmission Risk Prevention Plan  Transportation Screening Complete  PCP or Specialist Appt within 3-5 Days Complete  HRI or Home Care Consult Complete  Palliative Care Screening Complete  Medication Review (RN Care Manager) Referral to Pharmacy

## 2023-01-19 NOTE — Plan of Care (Signed)
  Problem: Activity: Goal: Risk for activity intolerance will decrease Outcome: Not Progressing   Problem: Safety: Goal: Ability to remain free from injury will improve Outcome: Not Progressing   

## 2023-01-19 NOTE — Progress Notes (Signed)
   01/19/23 1118  Spiritual Encounters  Type of Visit Follow up  Care provided to: Pt and family  Conversation partners present during encounter Nurse;Physician  Referral source Nurse (RN/NT/LPN)  Reason for visit Routine spiritual support  OnCall Visit No  Spiritual Framework  Presenting Themes Other (comment)  Interventions  Spiritual Care Interventions Made Compassionate presence;Prayer   Responded to  request for spiritual consult follow up. Patient was with daughter. Patient was in high spirits and laughing. Patient believes he received good news and will be discharged as soon as a rehab is located. Patient believes his "prayers have been answered" and states he is feeling much better.   Doctor also stated that the patient is okayed for discharge  Nurse stated that once rehab arranged patient will be discharged. Patient pleased.

## 2023-01-19 NOTE — Inpatient Diabetes Management (Signed)
Inpatient Diabetes Program Recommendations  AACE/ADA: New Consensus Statement on Inpatient Glycemic Control (2015)  Target Ranges:  Prepandial:   less than 140 mg/dL      Peak postprandial:   less than 180 mg/dL (1-2 hours)      Critically ill patients:  140 - 180 mg/dL   Lab Results  Component Value Date   GLUCAP 258 (H) 01/19/2023   HGBA1C 8.8 (H) 11/11/2019    Review of Glycemic Control  Latest Reference Range & Units 01/18/23 16:45 01/18/23 21:12 01/19/23 07:24 01/19/23 11:20  Glucose-Capillary 70 - 99 mg/dL 161 (H) 096 (H) 045 (H) 258 (H)  (H): Data is abnormally high Diabetes history: Type 2 DM Outpatient Diabetes medications: Tresiba 40-44 units QD Current orders for Inpatient glycemic control: Semglee 5 units every day, Novolog 0-15 units TID, Novolog 3 units TID  Inpatient Diabetes Program Recommendations:    Consider discontinuing Novolog 2 units TID. Of note, patient received correction + Novolog 2 units + Novolog 3 units with lunch thus could increase risk of hypoglycemia. Secure chat sent to Dr Nelson Chimes.   Thanks, Lujean Rave, MSN, RNC-OB Diabetes Coordinator [phone] (8a-5p)

## 2023-01-20 ENCOUNTER — Encounter (HOSPITAL_COMMUNITY): Payer: Self-pay | Admitting: Gastroenterology

## 2023-01-20 DIAGNOSIS — E1159 Type 2 diabetes mellitus with other circulatory complications: Secondary | ICD-10-CM

## 2023-01-20 DIAGNOSIS — E782 Mixed hyperlipidemia: Secondary | ICD-10-CM

## 2023-01-20 DIAGNOSIS — N1832 Chronic kidney disease, stage 3b: Secondary | ICD-10-CM

## 2023-01-20 DIAGNOSIS — R1084 Generalized abdominal pain: Secondary | ICD-10-CM

## 2023-01-20 DIAGNOSIS — Z7901 Long term (current) use of anticoagulants: Secondary | ICD-10-CM

## 2023-01-20 DIAGNOSIS — I7111 Aneurysm of the ascending aorta, ruptured: Secondary | ICD-10-CM

## 2023-01-20 LAB — BASIC METABOLIC PANEL
Anion gap: 10 (ref 5–15)
BUN: 25 mg/dL — ABNORMAL HIGH (ref 8–23)
CO2: 22 mmol/L (ref 22–32)
Calcium: 8.9 mg/dL (ref 8.9–10.3)
Chloride: 98 mmol/L (ref 98–111)
Creatinine, Ser: 1.35 mg/dL — ABNORMAL HIGH (ref 0.61–1.24)
GFR, Estimated: 52 mL/min — ABNORMAL LOW (ref >60)
Glucose, Bld: 212 mg/dL — ABNORMAL HIGH (ref 70–99)
Potassium: 4.1 mmol/L (ref 3.5–5.1)
Sodium: 130 mmol/L — ABNORMAL LOW (ref 135–145)

## 2023-01-20 LAB — CBC
HCT: 35.9 % — ABNORMAL LOW (ref 39.0–52.0)
Hemoglobin: 12.5 g/dL — ABNORMAL LOW (ref 13.0–17.0)
MCH: 30.4 pg (ref 26.0–34.0)
MCHC: 34.8 g/dL (ref 30.0–36.0)
MCV: 87.3 fL (ref 80.0–100.0)
Platelets: 268 10*3/uL (ref 150–400)
RBC: 4.11 MIL/uL — ABNORMAL LOW (ref 4.22–5.81)
RDW: 12.4 % (ref 11.5–15.5)
WBC: 8 10*3/uL (ref 4.0–10.5)
nRBC: 0 % (ref 0.0–0.2)

## 2023-01-20 LAB — GLUCOSE, CAPILLARY
Glucose-Capillary: 207 mg/dL — ABNORMAL HIGH (ref 70–99)
Glucose-Capillary: 216 mg/dL — ABNORMAL HIGH (ref 70–99)
Glucose-Capillary: 227 mg/dL — ABNORMAL HIGH (ref 70–99)
Glucose-Capillary: 162 mg/dL — ABNORMAL HIGH (ref 70–99)

## 2023-01-20 LAB — MAGNESIUM: Magnesium: 2 mg/dL (ref 1.7–2.4)

## 2023-01-20 MED ORDER — ORAL CARE MOUTH RINSE: 15.0000 mL | OROMUCOSAL | Status: DC | PRN

## 2023-01-20 NOTE — Progress Notes (Signed)
PROGRESS NOTE    Joseph [name]  ZOX:[id number] DOB: [birth year] DOA: 01/16/2023 PCP: Erskine Emery, NP   Brief Narrative:   82 y.o. male with medical history significant of CAD, stent to LAD and PCI to RCA in 2004, DES to LAD and mid and distal Circumflex, PAF on Eliquis, T2DM, HTN, HLD intolerant to statins, CKD stage III, CVA, who presented with nausea and abdominal pain.  Patient was admitted to the hospital about 2 weeks ago for small bowel obstruction treated with NG tube and conservative measures and eventually discharged home.  After going home patient started developing intermittent nausea and vomiting for 5 days prior to admission.  In between came to the ED on 6/2 7, CT abdomen pelvis showed panniculitis and was discharged home again.  Upon this admission noted to have AKI, hyponatremia with clinical signs of dehydration.  Right upper quadrant ultrasound showed hepatic steatosis, CT abdomen pelvis showed 5 mm bladder stone otherwise no acute pathology.  CTA was also negative.  Eventually due to abdominal symptoms, GI was consulted and patient underwent endoscopy which showed duodenitis/duodenal ulcer started on PPI and Carafate.  Echocardiogram showed EF of 60%.    Medically stable for discharge: PT/OT recommended SNF.  TOC working on placement.  Tentative plan for discharge 01/21/2023   Assessment & Plan:  Principal Problem:   Pain in the abdomen Active Problems:   Paroxysmal atrial fibrillation (HCC)   Essential hypertension   Type 2 diabetes mellitus with vascular disease (HCC)   CKD (chronic kidney disease), stage III (HCC)   History of CVA (cerebrovascular accident)   Hyperlipidemia   CAD S/P percutaneous coronary angioplasty   Peripheral vascular disease (HCC)   Anticoagulated   CAD in native artery   Hyponatremia   AKI (acute kidney injury) (HCC)   Elevated troponin   Overweight   Thoracic ascending aortic aneurysm (HCC)   Abdominal pain    Intermittent nausea  vomiting with abdominal pain Duodenitis/duodenal ulcer Duodenal polyp - Seen by [facility].  Status post endoscopy 7/2.  Showed duodenal ulcer/duodenitis duodenal polyp which was biopsied.  For now recommending PPI twice daily and Carafate and outpatient follow-up.  Recent small bowel obstruction History of peripheral arterial disease Transaminitis - No evidence of obstruction during this visit.  Seen by GI. -Transaminitis slowly improving.  Lipase negative.  Hyponatremia/acute kidney injury CKD stage IIIa - Baseline creatinine 1.2, admission creatinine 1.92.  Improved, today 1.32  Abnormal BS Improved today  Paroxysmal atrial fibrillation on Eliquis History of coronary artery disease status post stent - Continue Eliquis.  Continue Coreg, Imdur, Plavix  Diabetes mellitus type 2, uncontrolled secondary to hyperglycemia - A1c 7.5.  Sliding scale and Accu-Cheks -Add Semglee 5 units daily.  NovoLog 3 units Premeal.  Essential hypertension - Coreg, Imdur.  IV as needed  Hyperlipidemia - Intolerant to statins  History of CVA - On Eliquis  Reports of his scalp infection for which he was on Bactrim outpatient, took 2 days of it. Will prescribe 7 more days. Overnight had some drainage but appears stable this morning.   PT/OT-SNF.  TOC working on placement  DVT prophylaxis: Eliquis Code Status: Full  Family Communication: Daughter updated at bedside [facility] working on SNF placement     Diet Orders (From admission, onward)     Start     Ordered   01/18/23 0826  Diet Heart Room service appropriate? Yes; Fluid consistency: Thin  Diet effective now       Question Answer  Comment  Room service appropriate? Yes   Fluid consistency: Thin      01/18/23 0825            Subjective: Sitting up in the chair, feels well today.  No SOB  Examination: Constitutional: Not in acute distress Respiratory: Clear to auscultation bilaterally Cardiovascular: Normal sinus rhythm, no  rubs Abdomen: Nontender nondistended good bowel sounds Musculoskeletal: No edema noted Skin: No rashes seen Neurologic: CN 2-12 grossly intact.  And nonfocal Psychiatric: Normal judgment and insight. Alert and oriented x 3. Normal mood.     Objective: Vitals:   01/19/23 0910 01/19/23 1709 01/19/23 2037 01/20/23 0515  BP: 112/74 121/84 120/85 125/65  Pulse: 72 76 78 79  Resp:  18 18 17   Temp: (!) 97.5 F (36.4 C) (!) 97.4 F (36.3 C) 97.9 F (36.6 C) 98.2 F (36.8 C)  TempSrc: Oral Oral  Oral  SpO2: 96% 98% 97% 95%  Weight:      Height:        Intake/Output Summary (Last 24 hours) at 01/20/2023 4098 Last data filed at 01/20/2023 0519 Gross per 24 hour  Intake 460 ml  Output 300 ml  Net 160 ml    Filed Weights   01/16/23 0550 01/16/23 1305  Weight: 97.1 kg 97.5 kg    Scheduled Meds:  apixaban  5 mg Oral BID   carvedilol  6.25 mg Oral BID WC   clopidogrel  75 mg Oral Daily   insulin aspart  0-15 Units Subcutaneous TID WC   insulin aspart  3 Units Subcutaneous TID WC   insulin glargine-yfgn  5 Units Subcutaneous Daily   isosorbide mononitrate  30 mg Oral QPM   pantoprazole  40 mg Oral BID AC   sucralfate  1 g Oral BID   sulfamethoxazole-trimethoprim  1 tablet Oral Q12H   Continuous Infusions:    Nutritional status     Body mass index is 27.6 kg/m.  Data Reviewed:   CBC: Recent Labs  Lab 01/13/23 1400 01/16/23 0555 01/17/23 0137 01/18/23 0240 01/19/23 0325 01/20/23 0130  WBC 15.4* 6.2 4.3 5.4 5.5 8.0  NEUTROABS 14.8* 5.5  --   --   --   --   HGB 13.8 13.1 11.3* 12.4* 12.8* 12.5*  HCT 40.1 38.0* 33.1* 35.6* 36.0* 35.9*  MCV 89.5 89.8 90.2 87.9 88.5 87.3  PLT 251 209 182 207 228 268    Basic Metabolic Panel: Recent Labs  Lab 01/16/23 0555 01/17/23 0137 01/18/23 0240 01/19/23 0325 01/20/23 0130  NA 128* 129* 127* 131* 130*  K 4.1 4.2 3.8 3.7 4.1  CL 96* 100 97* 98 98  CO2 21* 20* 22 22 22   GLUCOSE 158* 148* 193* 185* 212*  BUN 39* 29*  20 19 25*  CREATININE 1.92* 1.66* 1.33* 1.32* 1.35*  CALCIUM 8.5* 8.0* 8.2* 8.4* 8.9  MG 2.1  --  2.0 2.0 2.0  PHOS  --   --  2.4*  --   --     GFR: Estimated Creatinine Clearance: 47.4 mL/min (A) (by C-G formula based on SCr of 1.35 mg/dL (H)). Liver Function Tests: Recent Labs  Lab 01/13/23 1400 01/16/23 0555 01/17/23 0137  AST 19 88* 48*  ALT 24 88* 62*  ALKPHOS 53 121 102  BILITOT 1.2 1.1 0.9  PROT 6.0* 5.7* 4.8*  ALBUMIN 3.6 3.1* 2.5*    Recent Labs  Lab 01/13/23 1400 01/16/23 0555  LIPASE 27 40    No results for input(s): "AMMONIA" in the  last 168 hours. Coagulation Profile: No results for input(s): "INR", "PROTIME" in the last 168 hours. Cardiac Enzymes: No results for input(s): "CKTOTAL", "CKMB", "CKMBINDEX", "TROPONINI" in the last 168 hours. BNP (last 3 results) No results for input(s): "PROBNP" in the last 8760 hours. HbA1C: No results for input(s): "HGBA1C" in the last 72 hours. CBG: Recent Labs  Lab 01/19/23 0724 01/19/23 1120 01/19/23 1713 01/19/23 2036 01/20/23 0717  GLUCAP 190* 258* 200* 225* 207*    Lipid Profile: No results for input(s): "CHOL", "HDL", "LDLCALC", "TRIG", "CHOLHDL", "LDLDIRECT" in the last 72 hours. Thyroid Function Tests: No results for input(s): "TSH", "T4TOTAL", "FREET4", "T3FREE", "THYROIDAB" in the last 72 hours. Anemia Panel: No results for input(s): "VITAMINB12", "FOLATE", "FERRITIN", "TIBC", "IRON", "RETICCTPCT" in the last 72 hours. Sepsis Labs: Recent Labs  Lab 01/16/23 1353 01/16/23 1705 01/16/23 2028  LATICACIDVEN 1.6 2.4* 1.2     Recent Results (from the past 240 hour(s))  SARS Coronavirus 2 by RT PCR (hospital order, performed in [facility] hospital lab) *cepheid single result test* Anterior Nasal Swab     Status: None   Collection Time: 01/16/23 12:02 PM   Specimen: Anterior Nasal Swab  Result Value Ref Range Status   SARS Coronavirus 2 by RT PCR NEGATIVE NEGATIVE Final    Comment: Performed at  [facility], [address] [place], [place] [address]         Radiology Studies: ECHOCARDIOGRAM COMPLETE  Result Date: 01/18/2023    ECHOCARDIOGRAM REPORT   Patient Name:   RAHEEL MILOVICH Date of Exam: 01/18/2023 Medical Rec #:  [id number]      Height:       74.0 in Accession #:    [id number]     Weight:       214.9 lb Date of Birth:  29-Oct-1940      BSA:          2.241 m Patient Age:    82 years       BP:           129/86 mmHg Patient Gender: M              HR:           93 bpm. Exam Location:  Inpatient Procedure: 2D Echo, Cardiac Doppler and Color Doppler Indications:    Cardiomyopathy-Unspecified I42.9  History:        Patient has prior history of Echocardiogram examinations, most                 recent 10/28/2020. CAD, CKD 3, PAD and Stroke; Risk                 Factors:Hypertension and Non-Smoker.  Sonographer:    Dondra Prader RVT RCS Referring Phys: [phone] ANKIT CHIRAG AMIN IMPRESSIONS  1. Left ventricular ejection fraction, by estimation, is 55 to 60%. The left ventricle has normal function. The left ventricle has no regional wall motion abnormalities. There is mild left ventricular hypertrophy. Left ventricular diastolic parameters are indeterminate.  2. Right ventricular systolic function is normal. The right ventricular size is normal.  3. Left atrial size was moderately dilated.  4. The mitral valve is normal in structure. No evidence of mitral valve regurgitation. No evidence of mitral stenosis.  5. The aortic valve is tricuspid. There is moderate calcification of the aortic valve. There is moderate thickening of the aortic valve. Aortic valve regurgitation is not visualized. Aortic valve sclerosis/calcification is present, without any evidence of aortic  stenosis.  6. Aortic dilatation noted. There is mild dilatation of the aortic root, measuring 40 mm.  7. The inferior vena cava is normal in size with greater than 50% respiratory variability, suggesting right atrial pressure of 3 mmHg.  FINDINGS  Left Ventricle: Left ventricular ejection fraction, by estimation, is 55 to 60%. The left ventricle has normal function. The left ventricle has no regional wall motion abnormalities. The left ventricular internal cavity size was normal in size. There is  mild left ventricular hypertrophy. Left ventricular diastolic parameters are indeterminate. Right Ventricle: The right ventricular size is normal. No increase in right ventricular wall thickness. Right ventricular systolic function is normal. Left Atrium: Left atrial size was moderately dilated. Right Atrium: Right atrial size was normal in size. Pericardium: Trivial pericardial effusion is present. The pericardial effusion is posterior to the left ventricle. Mitral Valve: The mitral valve is normal in structure. No evidence of mitral valve regurgitation. No evidence of mitral valve stenosis. Tricuspid Valve: The tricuspid valve is normal in structure. Tricuspid valve regurgitation is not demonstrated. No evidence of tricuspid stenosis. Aortic Valve: The aortic valve is tricuspid. There is moderate calcification of the aortic valve. There is moderate thickening of the aortic valve. Aortic valve regurgitation is not visualized. Aortic valve sclerosis/calcification is present, without any  evidence of aortic stenosis. Aortic valve mean gradient measures 3.0 mmHg. Aortic valve peak gradient measures 6.6 mmHg. Aortic valve area, by VTI measures 2.48 cm. Pulmonic Valve: The pulmonic valve was normal in structure. Pulmonic valve regurgitation is trivial. No evidence of pulmonic stenosis. Aorta: Aortic dilatation noted. There is mild dilatation of the aortic root, measuring 40 mm. Venous: The inferior vena cava is normal in size with greater than 50% respiratory variability, suggesting right atrial pressure of 3 mmHg. IAS/Shunts: The interatrial septum was not well visualized.  LEFT VENTRICLE PLAX 2D LVIDd:         4.70 cm LVIDs:         3.50 cm LV PW:          1.20 cm LV IVS:        1.20 cm LVOT diam:     2.20 cm LV SV:         50 LV SV Index:   22 LVOT Area:     3.80 cm  RIGHT VENTRICLE RV S prime:     13.70 cm/s TAPSE (M-mode): 1.6 cm LEFT ATRIUM             Index        RIGHT ATRIUM          Index LA diam:        4.20 cm 1.87 cm/m   RA Area:     7.46 cm LA Vol (A2C):   75.4 ml 33.64 ml/m  RA Volume:   12.40 ml 5.53 ml/m LA Vol (A4C):   69.6 ml 31.06 ml/m LA Biplane Vol: 74.2 ml 33.11 ml/m  AORTIC VALVE AV Area (Vmax):    2.51 cm AV Area (Vmean):   2.41 cm AV Area (VTI):     2.48 cm AV Vmax:           128.00 cm/s AV Vmean:          83.800 cm/s AV VTI:            0.202 m AV Peak Grad:      6.6 mmHg AV Mean Grad:      3.0 mmHg LVOT Vmax:  84.40 cm/s LVOT Vmean:        53.100 cm/s LVOT VTI:          0.132 m LVOT/AV VTI ratio: 0.65  AORTA Ao Root diam: 4.00 cm Ao Asc diam:  3.60 cm MITRAL VALVE               TRICUSPID VALVE MV Area (PHT): 3.72 cm    TR Peak grad:   20.2 mmHg MV Decel Time: 204 msec    TR Vmax:        225.00 cm/s MV E velocity: 95.10 cm/s                            SHUNTS                            Systemic VTI:  0.13 m                            Systemic Diam: 2.20 cm Charlton Haws MD Electronically signed by Charlton Haws MD Signature Date/Time: 01/18/2023/3:56:30 PM    Final            LOS: 3 days   Time spent= 35 mins    Azucena Fallen, DO [facility]  If 7PM-7AM, please contact night-coverage  01/20/2023, 8:22 AM

## 2023-01-20 NOTE — Care Management Important Message (Signed)
Important Message  Patient Details  Name: Joseph [name] MRN: [id number] Date of Birth: Dec 11, 1940   Medicare Important Message Given:  Yes     Dorena Bodo 01/20/2023, 12:07 PM

## 2023-01-20 NOTE — TOC Progression Note (Addendum)
Transition of Care [facility]) - Initial/Assessment Note    Patient Details  Name: Joseph [name] MRN: [id number] Date of Birth: February 25, 1941  [facility] CM/SW Contact:    Ralene Bathe, LCSW Phone Number: 01/20/2023, 10:05 AM  Clinical Narrative:                 Addendum 1400-  LCSW received a call from admissions at [facility].  The facility has extended a bed offer and can accept the patient tomorrow.    LCSW spoke with the patient who is in agreement with discharging to [facility].  Patient daughter and MD informed.   LCSW contacted Okey Regal in admissions at [facility].  The facility is reviewing the patient and will inform LCSW if the facility will extend a bed offer.    TOC following.   Patient Goals and CMS Choice            Expected Discharge Plan and Services                                              Prior Living Arrangements/Services                       Activities of Daily Living Home Assistive Devices/Equipment: Eyeglasses ADL Screening (condition at time of admission) Patient's cognitive ability adequate to safely complete daily activities?: Yes Is the patient deaf or have difficulty hearing?: Yes (speak louder) Does the patient have difficulty seeing, even when wearing glasses/contacts?: No Does the patient have difficulty concentrating, remembering, or making decisions?: Yes Patient able to express need for assistance with ADLs?: Yes Does the patient have difficulty dressing or bathing?: No Independently performs ADLs?: Yes (appropriate for developmental age) Communication:  (since tuesday he is unable to take care of himself) Does the patient have difficulty walking or climbing stairs?: Yes Weakness of Legs: Both Weakness of Arms/Hands: Both  Permission Sought/Granted                  Emotional Assessment              Admission diagnosis:  Hyponatremia [E87.1] Epigastric abdominal pain  [R10.13] Pain in the abdomen [R10.9] AKI (acute kidney injury) (HCC) [N17.9] Abdominal pain [R10.9] Patient Active Problem List   Diagnosis Date Noted   Abdominal pain 01/17/2023   Pain in the abdomen 01/16/2023   Hyponatremia 01/16/2023   AKI (acute kidney injury) (HCC) 01/16/2023   Elevated troponin 01/16/2023   Overweight 01/16/2023   Thoracic ascending aortic aneurysm (HCC) 01/16/2023   SBO (small bowel obstruction) (HCC) 01/03/2023   CKD (chronic kidney disease), stage III (HCC) 01/02/2023   Small bowel obstruction (HCC) 01/02/2023   CAD in native artery 06/12/2019   Unstable angina (HCC) 05/17/2019   Lower GI bleed 12/26/2017   Postural dizziness with presyncope 12/26/2017   Coronary artery disease of native artery of native heart with stable angina pectoris (HCC) 04/11/2017   Hyperlipidemia 04/11/2017   Anticoagulated 05/30/2016   Paroxysmal atrial fibrillation (HCC) 05/29/2016   Exertional angina (HCC) 08/02/2014   Bradycardia 02/02/2012   History of CVA (cerebrovascular accident) 08/01/2011   Leg pain 03/31/2011   Abdominal pain 10/13/2010   Hip pain 10/13/2010   Essential hypertension 09/09/2009   Peripheral vascular disease (HCC) 09/09/2009   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  CAD S/P percutaneous coronary angioplasty 09/08/2009   PCP:  Erskine Emery, NP Pharmacy:   [facility] [phone] - [place], [place] - [address] [place] [address] Phone: [phone] Fax: [phone]     Social Determinants of Health (SDOH) Social History: SDOH Screenings   Food Insecurity: No Food Insecurity (01/16/2023)  Housing: Low Risk  (01/16/2023)  Transportation Needs: No Transportation Needs (01/16/2023)  Utilities: Not At Risk (01/16/2023)  Tobacco Use: Low Risk  (01/18/2023)   SDOH Interventions: Transportation Interventions: Intervention Not Indicated, Inpatient TOC, Patient Resources  (Friends/Family)   Readmission Risk Interventions    01/17/2023    3:00 PM  Readmission Risk Prevention Plan  Transportation Screening Complete  PCP or Specialist Appt within 3-5 Days Complete  HRI or Home Care Consult Complete  Palliative Care Screening Complete  Medication Review (RN Care Manager) Referral to Pharmacy

## 2023-01-21 LAB — BASIC METABOLIC PANEL
Anion gap: 8 (ref 5–15)
BUN: 27 mg/dL — ABNORMAL HIGH (ref 8–23)
CO2: 23 mmol/L (ref 22–32)
Calcium: 8.5 mg/dL — ABNORMAL LOW (ref 8.9–10.3)
Chloride: 98 mmol/L (ref 98–111)
Creatinine, Ser: 1.79 mg/dL — ABNORMAL HIGH (ref 0.61–1.24)
GFR, Estimated: 37 mL/min — ABNORMAL LOW (ref >60)
Glucose, Bld: 178 mg/dL — ABNORMAL HIGH (ref 70–99)
Potassium: 4.2 mmol/L (ref 3.5–5.1)
Sodium: 129 mmol/L — ABNORMAL LOW (ref 135–145)

## 2023-01-21 LAB — GLUCOSE, CAPILLARY
Glucose-Capillary: 189 mg/dL — ABNORMAL HIGH (ref 70–99)
Glucose-Capillary: 211 mg/dL — ABNORMAL HIGH (ref 70–99)
Glucose-Capillary: 231 mg/dL — ABNORMAL HIGH (ref 70–99)

## 2023-01-21 LAB — MAGNESIUM: Magnesium: 1.9 mg/dL (ref 1.7–2.4)

## 2023-01-21 LAB — CBC
HCT: 33.1 % — ABNORMAL LOW (ref 39.0–52.0)
Hemoglobin: 11.4 g/dL — ABNORMAL LOW (ref 13.0–17.0)
MCH: 31.1 pg (ref 26.0–34.0)
MCHC: 34.4 g/dL (ref 30.0–36.0)
MCV: 90.4 fL (ref 80.0–100.0)
Platelets: 293 10*3/uL (ref 150–400)
RBC: 3.66 MIL/uL — ABNORMAL LOW (ref 4.22–5.81)
RDW: 12.6 % (ref 11.5–15.5)
WBC: 6.5 10*3/uL (ref 4.0–10.5)
nRBC: 0 % (ref 0.0–0.2)

## 2023-01-21 MED ORDER — APIXABAN 2.5 MG PO TABS: 2.5000 mg | ORAL_TABLET | Freq: Two times a day (BID) | ORAL | 0 refills | Status: DC

## 2023-01-21 MED ORDER — DOXYCYCLINE HYCLATE 100 MG PO TABS: 100.0000 mg | ORAL_TABLET | Freq: Two times a day (BID) | ORAL | 0 refills | Status: DC

## 2023-01-21 MED ORDER — PANTOPRAZOLE SODIUM 40 MG PO TBEC: 40.0000 mg | DELAYED_RELEASE_TABLET | Freq: Two times a day (BID) | ORAL | Status: DC

## 2023-01-21 MED ORDER — SUCRALFATE 1 G PO TABS: 1.0000 g | ORAL_TABLET | Freq: Two times a day (BID) | ORAL | 0 refills | Status: DC

## 2023-01-21 MED ORDER — DOXYCYCLINE HYCLATE 100 MG PO TABS
100.0000 mg | ORAL_TABLET | Freq: Two times a day (BID) | ORAL | Status: DC
  Administered 2023-01-21: 100 mg via ORAL
  Filled 2023-01-21: qty 1

## 2023-01-21 NOTE — Progress Notes (Signed)
Ok to change septra to doxy 100mg  BID to complete 5d for the scalp infection due to AKI per Dr. Natale Milch.  Ulyses Southward, PharmD, BCIDP, AAHIVP, CPP Infectious Disease Pharmacist 01/21/2023 7:58 AM

## 2023-01-21 NOTE — Progress Notes (Signed)
Attempted to contact rehab facility three times to give report. Each time line was disconnected or call to floor was unanswered. Will attempt to call later when time allows. Transport to be here shortly

## 2023-01-21 NOTE — Discharge Summary (Signed)
Physician Discharge Summary  Joseph [name] ZOX:[id number] DOB: 1941/03/08 DOA: 01/16/2023  PCP: Erskine Emery, NP  Admit date: 01/16/2023 Discharge date: 01/21/2023  Admitted From: Home Disposition:  SNF  Recommendations for Outpatient Follow-up:  Follow up with PCP in 1-2 weeks Follow up with GI as scheduled  Discharge Condition:Stable  CODE STATUS:Full  Diet recommendation:  Low salt low fat low carb diet  Brief/Interim Summary: 82 y.o. male with medical history significant of CAD, stent to LAD and PCI to RCA in 2004, DES to LAD and mid and distal Circumflex, PAF on Eliquis, T2DM, HTN, HLD intolerant to statins, CKD stage III, CVA, who presented with nausea and abdominal pain.  Patient was admitted to the hospital about 2 weeks ago for small bowel obstruction treated with NG tube and conservative measures and eventually discharged home.  After going home patient started developing intermittent nausea and vomiting for 5 days prior to admission.  In between came to the ED on 6/2 7, CT abdomen pelvis showed panniculitis and was discharged home again.  Upon this admission noted to have AKI, hyponatremia with clinical signs of dehydration.  Right upper quadrant ultrasound showed hepatic steatosis, CT abdomen pelvis showed 5 mm bladder stone otherwise no acute pathology.  CTA was also negative.  Eventually due to abdominal symptoms, GI was consulted and patient underwent endoscopy which showed duodenitis/duodenal ulcer started on PPI and Carafate.  Echocardiogram showed EF of 60%.     Medically stable for discharge: PT/OT recommended SNF.  TOC received approval for placement.  Medication changes as below - of note decrease Eliquis to 2.5mg  based on age/creatinine. Doxycyline to continue for 2 days to complete antibiotic course.   Discharge Diagnoses:  Principal Problem:   Pain in the abdomen Active Problems:   Paroxysmal atrial fibrillation (HCC)   Essential hypertension   Type 2 diabetes  mellitus with vascular disease (HCC)   CKD (chronic kidney disease), stage III (HCC)   History of CVA (cerebrovascular accident)   Hyperlipidemia   CAD S/P percutaneous coronary angioplasty   Peripheral vascular disease (HCC)   Anticoagulated   CAD in native artery   Hyponatremia   AKI (acute kidney injury) (HCC)   Elevated troponin   Overweight   Thoracic ascending aortic aneurysm (HCC)   Abdominal pain   Intermittent nausea vomiting with abdominal pain Duodenitis/duodenal ulcer Duodenal polyp - Seen by [facility].  Status post endoscopy 7/2.  Showed duodenal ulcer/duodenitis duodenal polyp which was biopsied.  Continue PPI twice daily and Carafate. Outpatient follow-up per GI schedule.  Recent small bowel obstruction History of peripheral arterial disease Transaminitis - No evidence of obstruction during this visit.  Seen by GI. -Transaminitis slowly improving.  Lipase negative.   Hyponatremia/acute kidney injury CKD stage IIIa - Baseline creatinine 1.2, creatinine still labile but around 1.5/ which may be his new baseline. Eliquis dose will need to be adjusted based on these numbers. Would err on the side of caution given concurrent plavix use.  Paroxysmal atrial fibrillation on Eliquis History of coronary artery disease status post stent - Continue Eliquis(lower dose).  Continue Coreg, Imdur, Plavix   Diabetes mellitus type 2, uncontrolled secondary to hyperglycemia - A1c 7.5.  Sliding scale and Accu-Cheks -Add Semglee 5 units daily.  NovoLog 3 units Premeal.   Essential hypertension - Coreg, Imdur.    Hyperlipidemia - Intolerant to statins   History of CVA - On Eliquis/plavix   Reports of his scalp infection for which he was on Bactrim outpatient,  took 2 days prior to admit-transition to doxycycline - continue for 2 days after DC    Discharge Instructions   Allergies as of 01/21/2023       Reactions   Bempedoic Acid Other (See Comments)   NEXLETOL Myalgias, GI  upset   [name] [alirocumab] Other (See Comments)   Muscle weakness and rash   Rosuvastatin Other (See Comments)   Low energy/leg and hip pain   Zetia [ezetimibe] Other (See Comments)   myalgias   Hydralazine Other (See Comments)   Muscle Aches   Keflex [cephalexin] Other (See Comments)   Reaction unk        Medication List     STOP taking these medications    ondansetron 8 MG disintegrating tablet Commonly known as: ZOFRAN-ODT   simethicone 80 MG chewable tablet Commonly known as: MYLICON   sulfamethoxazole-trimethoprim 800-160 MG tablet Commonly known as: BACTRIM DS       TAKE these medications    acetaminophen 650 MG CR tablet Commonly known as: TYLENOL Take 1 tablet (650 mg total) by mouth every 8 (eight) hours as needed for pain.   apixaban 2.5 MG Tabs tablet Commonly known as: Eliquis Take 1 tablet (2.5 mg total) by mouth 2 (two) times daily. What changed:  medication strength how much to take   carvedilol 6.25 MG tablet Commonly known as: COREG TAKE 1 TABLET BY MOUTH TWICE A DAY What changed: when to take this   clopidogrel 75 MG tablet Commonly known as: PLAVIX TAKE 1 TABLET BY MOUTH EVERY DAY   diltiazem 30 MG tablet Commonly known as: Cardizem Take 1 tablet (30 mg total) by mouth as needed (Rapid Palpitations).   doxycycline 100 MG tablet Commonly known as: VIBRA-TABS Take 1 tablet (100 mg total) by mouth every 12 (twelve) hours. What changed: when to take this   fish oil-omega-3 fatty acids 1000 MG capsule Take 1 g by mouth at bedtime.   isosorbide mononitrate 30 MG 24 hr tablet Commonly known as: IMDUR Take 30 mg by mouth every evening.   loperamide 2 MG capsule Commonly known as: IMODIUM Take 2 mg by mouth as needed for diarrhea or loose stools.   nitroGLYCERIN 0.4 MG SL tablet Commonly known as: NITROSTAT PLACE 1 TAB UNDER THE TONGUE EVERY 5 MINS AS NEEDED FOR CHEST PAIN FOR UP TO 3 DOSES What changed: See the new  instructions.   ondansetron 4 MG tablet Commonly known as: Zofran Take 1 tablet (4 mg total) by mouth every 8 (eight) hours as needed for nausea or vomiting.   oxymetazoline 0.05 % nasal spray Commonly known as: AFRIN Place 2 sprays into both nostrils at bedtime.   pantoprazole 40 MG tablet Commonly known as: PROTONIX Take 1 tablet (40 mg total) by mouth 2 (two) times daily before a meal. What changed: when to take this   polyethylene glycol 17 g packet Commonly known as: MiraLax Take 17 g by mouth daily. What changed:  when to take this reasons to take this   sucralfate 1 g tablet Commonly known as: CARAFATE Take 1 tablet (1 g total) by mouth 2 (two) times daily for 14 days.   telmisartan 20 MG tablet Commonly known as: MICARDIS Take 1 tablet (20 mg total) by mouth daily.   Evaristo Bury FlexTouch 100 UNIT/ML FlexTouch Pen Generic drug: insulin degludec Inject 10 Units into the skin daily. What changed:  how much to take additional instructions   Vitamin D3 25 MCG (1000 UT) Caps Take 1,000 Units  by mouth daily.   ZINC ACETATE PO Take 1 tablet by mouth daily.        Follow-up Information     Erskine Emery, NP Follow up in 1 week(s).   Contact information: [address] [place] [address] [phone]                Allergies  Allergen Reactions   Bempedoic Acid Other (See Comments)    NEXLETOL Myalgias, GI upset   [name] [Alirocumab] Other (See Comments)    Muscle weakness and rash   Rosuvastatin Other (See Comments)    Low energy/leg and hip pain   Zetia [Ezetimibe] Other (See Comments)    myalgias   Hydralazine Other (See Comments)    Muscle Aches   Keflex [Cephalexin] Other (See Comments)    Reaction unk    Consultations: GI, [facility]   Procedures/Studies: ECHOCARDIOGRAM COMPLETE  Result Date: 01/18/2023    ECHOCARDIOGRAM REPORT   Patient Name:   Joseph [name] Date of Exam: 01/18/2023 Medical Rec #:  [id number]      Height:        74.0 in Accession #:    [id number]     Weight:       214.9 lb Date of Birth:  1941/02/12      BSA:          2.241 m Patient Age:    82 years       BP:           129/86 mmHg Patient Gender: M              HR:           93 bpm. Exam Location:  Inpatient Procedure: 2D Echo, Cardiac Doppler and Color Doppler Indications:    Cardiomyopathy-Unspecified I42.9  History:        Patient has prior history of Echocardiogram examinations, most                 recent 10/28/2020. CAD, CKD 3, PAD and Stroke; Risk                 Factors:Hypertension and Non-Smoker.  Sonographer:    Dondra Prader RVT RCS Referring Phys: [phone] ANKIT CHIRAG AMIN IMPRESSIONS  1. Left ventricular ejection fraction, by estimation, is 55 to 60%. The left ventricle has normal function. The left ventricle has no regional wall motion abnormalities. There is mild left ventricular hypertrophy. Left ventricular diastolic parameters are indeterminate.  2. Right ventricular systolic function is normal. The right ventricular size is normal.  3. Left atrial size was moderately dilated.  4. The mitral valve is normal in structure. No evidence of mitral valve regurgitation. No evidence of mitral stenosis.  5. The aortic valve is tricuspid. There is moderate calcification of the aortic valve. There is moderate thickening of the aortic valve. Aortic valve regurgitation is not visualized. Aortic valve sclerosis/calcification is present, without any evidence of aortic stenosis.  6. Aortic dilatation noted. There is mild dilatation of the aortic root, measuring 40 mm.  7. The inferior vena cava is normal in size with greater than 50% respiratory variability, suggesting right atrial pressure of 3 mmHg. FINDINGS  Left Ventricle: Left ventricular ejection fraction, by estimation, is 55 to 60%. The left ventricle has normal function. The left ventricle has no regional wall motion abnormalities. The left ventricular internal cavity size was normal in size. There is  mild left  ventricular hypertrophy. Left ventricular diastolic parameters are indeterminate.  Right Ventricle: The right ventricular size is normal. No increase in right ventricular wall thickness. Right ventricular systolic function is normal. Left Atrium: Left atrial size was moderately dilated. Right Atrium: Right atrial size was normal in size. Pericardium: Trivial pericardial effusion is present. The pericardial effusion is posterior to the left ventricle. Mitral Valve: The mitral valve is normal in structure. No evidence of mitral valve regurgitation. No evidence of mitral valve stenosis. Tricuspid Valve: The tricuspid valve is normal in structure. Tricuspid valve regurgitation is not demonstrated. No evidence of tricuspid stenosis. Aortic Valve: The aortic valve is tricuspid. There is moderate calcification of the aortic valve. There is moderate thickening of the aortic valve. Aortic valve regurgitation is not visualized. Aortic valve sclerosis/calcification is present, without any  evidence of aortic stenosis. Aortic valve mean gradient measures 3.0 mmHg. Aortic valve peak gradient measures 6.6 mmHg. Aortic valve area, by VTI measures 2.48 cm. Pulmonic Valve: The pulmonic valve was normal in structure. Pulmonic valve regurgitation is trivial. No evidence of pulmonic stenosis. Aorta: Aortic dilatation noted. There is mild dilatation of the aortic root, measuring 40 mm. Venous: The inferior vena cava is normal in size with greater than 50% respiratory variability, suggesting right atrial pressure of 3 mmHg. IAS/Shunts: The interatrial septum was not well visualized.  LEFT VENTRICLE PLAX 2D LVIDd:         4.70 cm LVIDs:         3.50 cm LV PW:         1.20 cm LV IVS:        1.20 cm LVOT diam:     2.20 cm LV SV:         50 LV SV Index:   22 LVOT Area:     3.80 cm  RIGHT VENTRICLE RV S prime:     13.70 cm/s TAPSE (M-mode): 1.6 cm LEFT ATRIUM             Index        RIGHT ATRIUM          Index LA diam:        4.20 cm 1.87  cm/m   RA Area:     7.46 cm LA Vol (A2C):   75.4 ml 33.64 ml/m  RA Volume:   12.40 ml 5.53 ml/m LA Vol (A4C):   69.6 ml 31.06 ml/m LA Biplane Vol: 74.2 ml 33.11 ml/m  AORTIC VALVE AV Area (Vmax):    2.51 cm AV Area (Vmean):   2.41 cm AV Area (VTI):     2.48 cm AV Vmax:           128.00 cm/s AV Vmean:          83.800 cm/s AV VTI:            0.202 m AV Peak Grad:      6.6 mmHg AV Mean Grad:      3.0 mmHg LVOT Vmax:         84.40 cm/s LVOT Vmean:        53.100 cm/s LVOT VTI:          0.132 m LVOT/AV VTI ratio: 0.65  AORTA Ao Root diam: 4.00 cm Ao Asc diam:  3.60 cm MITRAL VALVE               TRICUSPID VALVE MV Area (PHT): 3.72 cm    TR Peak grad:   20.2 mmHg MV Decel Time: 204 msec    TR Vmax:  225.00 cm/s MV E velocity: 95.10 cm/s                            SHUNTS                            Systemic VTI:  0.13 m                            Systemic Diam: 2.20 cm Charlton Haws MD Electronically signed by Charlton Haws MD Signature Date/Time: 01/18/2023/3:56:30 PM    Final    VAS [place] MESENTERIC  Result Date: 01/17/2023 ABDOMINAL VISCERAL Patient Name:  Joseph [name]  Date of Exam:   01/17/2023 Medical Rec #: [id number]       Accession #:    [id number] Date of Birth: 1940-11-11       Patient Gender: M Patient Age:   11 years Exam Location:  [facility] Procedure:      VAS [place] MESENTERIC Referring Phys: [id number] NA LI -------------------------------------------------------------------------------- Indications: Intermittent nausea and abdominal pain. History of small bowel              obstruction in 6/24. High Risk Factors: Hypertension, hyperlipidemia, coronary artery disease. Limitations: Air/bowel gas. Performing Technologist: Marilynne Halsted RDMS, RVT  Examination Guidelines: A complete evaluation includes B-mode imaging, spectral Doppler, color Doppler, and power Doppler as needed of all accessible portions of each vessel. Bilateral testing is considered an integral part of a complete examination.  Limited examinations for reoccurring indications may be performed as noted.  Duplex Findings: +--------------------+--------+--------+------+----------------------+ Mesenteric          PSV cm/sEDV cm/sPlaque       Comments        +--------------------+--------+--------+------+----------------------+ Aorta Prox             90                                        +--------------------+--------+--------+------+----------------------+ Celiac Artery Origin  222                                        +--------------------+--------+--------+------+----------------------+ SMA Proximal          295                                        +--------------------+--------+--------+------+----------------------+ SMA Mid               124                                        +--------------------+--------+--------+------+----------------------+ SMA Distal            141                                        +--------------------+--------+--------+------+----------------------+ [facility]  not clearly visualized +--------------------+--------+--------+------+----------------------+ Splenic                                   not clearly visualized +--------------------+--------+--------+------+----------------------+ IMA                   198                                        +--------------------+--------+--------+------+----------------------+    Summary: Mesenteric:  Patent with mildly elevated velocities without significant stenosis.  *See table(s) above for measurements and observations.  Diagnosing physician: Gerarda Fraction  Electronically signed by Gerarda Fraction on 01/17/2023 at 3:13:33 PM.    Final    CT ABDOMEN PELVIS W CONTRAST  Addendum Date: 01/16/2023   ADDENDUM REPORT: 01/16/2023 09:50 ADDENDUM: The 1st impression contains a typographical error, and should read NO evidence of appendicitis or other acute findings. Electronically  Signed   By: Danae Orleans M.D.   On: 01/16/2023 09:50   Result Date: 01/16/2023 CLINICAL DATA:  Right lower quadrant abdominal pain. Prior prostatectomy for prostate carcinoma. * Tracking Code: BO * EXAM: CT ABDOMEN AND PELVIS WITH CONTRAST TECHNIQUE: Multidetector CT imaging of the abdomen and pelvis was performed using the standard protocol following bolus administration of intravenous contrast. RADIATION DOSE REDUCTION: This exam was performed according to the departmental dose-optimization program which includes automated exposure control, adjustment of the mA and/or kV according to patient size and/or use of iterative reconstruction technique. CONTRAST:  60mL OMNIPAQUE IOHEXOL 350 MG/ML SOLN COMPARISON:  01/13/2023 FINDINGS: Lower Chest: Dependent bibasilar atelectasis, similar to prior exam. Hepatobiliary: No suspicious hepatic masses identified. Layering sludge versus tiny gallstones 1 again seen, without evidence of cholecystitis or biliary ductal dilatation. Pancreas:  No mass or inflammatory changes. Spleen: Within normal limits in size and appearance. Adrenals/Urinary Tract: No suspicious masses identified. Multiple small less than 1 cm bilateral renal calculi are seen. No evidence of ureteral calculi or hydronephrosis, however a 5 mm calculus is again seen in the urinary bladder near the left UVJ. Stomach/Bowel: No evidence of obstruction, inflammatory process or abnormal fluid collections. Diverticulosis is seen mainly involving the sigmoid colon, however there is no evidence of diverticulitis. Although the appendix is not directly visualized, no inflammatory process seen in region of the cecum or elsewhere. Vascular/Lymphatic: No pathologically enlarged lymph nodes. No acute vascular findings. Aortic atherosclerotic calcification incidentally noted. Reproductive: Prior prostatectomy. No mass or other significant abnormality identified. Other:  None. Musculoskeletal: No suspicious bone lesions  identified. Lower lumbar spine fusion hardware again noted. IMPRESSION: Evidence of appendicitis or other acute findings. Bilateral nephrolithiasis. Unchanged 5 mm calculus in urinary bladder near the left UVJ. No evidence of hydroureteronephrosis. Colonic diverticulosis, without radiographic evidence of diverticulitis. Layering sludge versus tiny gallstones, without radiographic evidence of cholecystitis. Prior prostatectomy. No evidence of abdominal or pelvic metastatic disease. Aortic Atherosclerosis (ICD10-I70.0). Electronically Signed: By: Danae Orleans M.D. On: 01/16/2023 09:41   CT Angio Chest PE W and/or Wo Contrast  Result Date: 01/16/2023 CLINICAL DATA:  Chest pain. Elevated D-dimer. Intermediate probability for pulmonary embolism. EXAM: CT ANGIOGRAPHY CHEST WITH CONTRAST TECHNIQUE: Multidetector CT imaging of the chest was performed using the standard protocol during bolus administration of intravenous contrast. Multiplanar CT image reconstructions and MIPs were obtained to evaluate the vascular anatomy. RADIATION DOSE REDUCTION: This exam  was performed according to the departmental dose-optimization program which includes automated exposure control, adjustment of the mA and/or kV according to patient size and/or use of iterative reconstruction technique. CONTRAST:  60mL OMNIPAQUE IOHEXOL 350 MG/ML SOLN COMPARISON:  05/17/2019 FINDINGS: Cardiovascular: Satisfactory opacification of pulmonary arteries noted, and no pulmonary emboli identified. No evidence of thoracic aortic dissection. 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 Aortic and coronary atherosclerotic calcification incidentally noted. Mediastinum/Nodes: No masses or pathologically enlarged lymph nodes identified. Lungs/Pleura: Increased dependent atelectasis both lower lobes. No evidence of pulmonary consolidation mass, or pleural effusion. Upper abdomen: No acute findings. Musculoskeletal: No suspicious bone lesions identified.  Review of the MIP images confirms the above findings. IMPRESSION: No evidence of pulmonary embolism. Increased dependent atelectasis in both lower lobes. 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 Recommend annual imaging followup by CTA or MRA. This recommendation follows 2010 ACCF/AHA/AATS/ACR/ASA/SCA/SCAI/SIR/STS/SVM Guidelines for the Diagnosis and Management of Patients with Thoracic Aortic Disease. Circulation. 2010; 121: W098-J191. Aortic aneurysm NOS (ICD10-I71.9) Aortic Atherosclerosis (ICD10-I70.0). Electronically Signed   By: Danae Orleans M.D.   On: 01/16/2023 09:33   US Abdomen Limited RUQ (LIVER/GB)  Result Date: 01/16/2023 CLINICAL DATA:  82 year old male with history of transaminitis. EXAM: ULTRASOUND ABDOMEN LIMITED RIGHT UPPER QUADRANT COMPARISON:  Abdominal ultrasound 01/13/2023. FINDINGS: Gallbladder: No gallstones or wall thickening visualized. No sonographic Murphy sign noted by sonographer. Common bile duct: Diameter: 3.6 mm Liver: No focal lesion identified. Diffusely increased hepatic echogenicity. Portal vein is patent on color Doppler imaging with normal direction of blood flow towards the liver. Other: None. IMPRESSION: 1. No acute findings. Specifically, no gallstones or findings to suggest an acute cholecystitis. 2. Diffusely increased hepatic echogenicity suggestive of hepatic steatosis. Electronically Signed   By: Trudie Reed M.D.   On: 01/16/2023 08:18   DG Abdomen 1 View  Result Date: 01/16/2023 CLINICAL DATA:  Abdominal pain. Recent small bowel obstruction 01/02/2023. EXAM: ABDOMEN - 1 VIEW COMPARISON:  01/05/2023 FINDINGS: Air is present throughout the colon. There are a few air-filled nondilated small bowel loops present. No free peritoneal air. Remainder of the exam is unchanged. IMPRESSION: Nonspecific, nonobstructive bowel gas pattern. Electronically Signed   By: Elberta Fortis M.D.   On: 01/16/2023 08:12   DG Chest 2 View  Result Date:  01/16/2023 CLINICAL DATA:  Chest wall pain and abdominal pain. Recent small bowel obstruction 01/02/2023. EXAM: CHEST - 2 VIEW COMPARISON:  01/13/2023 FINDINGS: Lungs are adequately inflated with mild stable left base opacification likely atelectasis and less likely infection. No significant effusion. Cardiomediastinal silhouette and remainder of the exam is unchanged. IMPRESSION: Mild left base opacification likely atelectasis and less likely infection. Electronically Signed   By: Elberta Fortis M.D.   On: 01/16/2023 08:10   DG Chest Port 1 View  Result Date: 01/13/2023 CLINICAL DATA:  Chest pain. EXAM: PORTABLE CHEST 1 VIEW COMPARISON:  Chest radiograph dated 12/14/2022. FINDINGS: Shallow inspiration. Left lung base atelectasis. No focal consolidation, pleural effusion, or pneumothorax. The cardiac silhouette is within normal limits. No acute osseous pathology. IMPRESSION: Shallow inspiration with left lung base atelectasis. Electronically Signed   By: Elgie Collard M.D.   On: 01/13/2023 17:05   CT ABDOMEN PELVIS W CONTRAST  Result Date: 01/13/2023 CLINICAL DATA:  Concern for bowel obstruction. EXAM: CT ABDOMEN AND PELVIS WITH CONTRAST TECHNIQUE: Multidetector CT imaging of the abdomen and pelvis was performed using the standard protocol following bolus administration of intravenous contrast. RADIATION DOSE REDUCTION: This exam was performed according to  the departmental dose-optimization program which includes automated exposure control, adjustment of the mA and/or kV according to patient size and/or use of iterative reconstruction technique. CONTRAST:  75mL OMNIPAQUE IOHEXOL 350 MG/ML SOLN COMPARISON:  CT abdomen pelvis dated 01/02/2023. FINDINGS: Lower chest: Bibasilar streaky atelectasis/scarring. The visualized lung bases are otherwise clear. Advanced 3 vessel coronary vascular calcification. No intra-abdominal free air or free fluid. Hepatobiliary: The liver is unremarkable. No biliary dilatation.  Layering sludge or small stones in the gallbladder. No pericholecystic fluid or evidence of acute cholecystitis by CT. Pancreas: The pancreas is atrophic. No dilatation of the main pancreatic duct. No active inflammatory changes. Spleen: Normal in size without focal abnormality. Adrenals/Urinary Tract: The adrenal glands are unremarkable. Mild bilateral renal parenchyma atrophy. Multiple nonobstructing bilateral renal calculi measure up to 6 mm in the inferior pole the right kidney. There is no hydronephrosis on either side. There is symmetric enhancement and excretion of contrast by both kidneys. A 6 mm bladder stone is again noted. The urinary bladder is otherwise unremarkable. Stomach/Bowel: Mild sigmoid diverticulosis without active inflammatory changes. There is no bowel obstruction or active inflammation. The appendix is normal. Vascular/Lymphatic: Moderate aortoiliac atherosclerotic disease. The IVC is unremarkable. No portal venous gas. There is no adenopathy. There is mild haziness of the mesentery with multiple top-normal lymph nodes with a "misty mesentery" appearance. This has progressed since the prior CT. This finding is nonspecific but may be related to underlying inflammatory/infectious etiology. Reproductive: Prostatectomy. Other: Small fat containing bilateral inguinal hernias. Midline vertical anterior pelvic wall incisional scar. Musculoskeletal: Degenerative changes of the spine. Lower lumbar posterior fusion. No acute osseous pathology. IMPRESSION: 1. No bowel obstruction. Normal appendix. 2. Nonspecific upper mesentery haziness may represent panniculitis. Enteritis is less likely. 3. Mild sigmoid diverticulosis. 4. Nonobstructing bilateral renal calculi. No hydronephrosis. 5. A 6 mm bladder stone. 6.  Aortic Atherosclerosis (ICD10-I70.0). Electronically Signed   By: Elgie Collard M.D.   On: 01/13/2023 16:58   US Abdomen Limited RUQ (LIVER/GB)  Result Date: 01/13/2023 CLINICAL DATA:  RUQ  discomfort EXAM: ULTRASOUND ABDOMEN LIMITED RIGHT UPPER QUADRANT COMPARISON:  CT abdomen/pelvis June 16, 24. FINDINGS: Gallbladder: Possible gallstones. No thickening visualized. No sonographic Murphy sign noted by sonographer. Common bile duct: Diameter: 4.8 mm, within normal limits Liver: No focal lesion identified. Increased parenchymal echogenicity relative to the kidney. Portal vein is patent on color Doppler imaging with normal direction of blood flow towards the liver. Other: None. IMPRESSION: 1. Limited study with possible cholelithiasis. No evidence of acute cholecystitis. 2. Increased hepatic echogenicity which is nonspecific but can be seen with hepatic steatosis. Electronically Signed   By: Feliberto Harts M.D.   On: 01/13/2023 15:30   DG Abd 1 View  Result Date: 01/05/2023 CLINICAL DATA:  Abdominal distention and diarrhea EXAM: ABDOMEN - 1 VIEW COMPARISON:  01/04/2023 FINDINGS: Further clearance of the contrast medium from the colon. A loop of small bowel to the left of midline measures up to 3.8 cm in diameter, mildly dilated. Previous loops of small bowel were to about 4.8 cm on yesterday's exam. The appearance suggests improvement. Substantial lower thoracic and lumbar spondylosis along with degenerative disc disease. Posterolateral rod and pedicle screw fixation at L4-L5-S1 with posterior decompression. 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 Mild spurring of both femoral heads. Atherosclerosis is present, including aortoiliac atherosclerotic disease. IMPRESSION: 1. Further clearance of the contrast medium from the colon. 2. Mildly dilated loop of small bowel to the left of midline, but with  improvement from yesterday's exam. 3.  Aortic Atherosclerosis (ICD10-I70.0). 4. Substantial lower thoracic and lumbar spondylosis and degenerative disc disease. 5. Posterolateral rod and pedicle screw fixation at L4-L5-S1 with posterior decompression. Electronically Signed   By:  Gaylyn Rong M.D.   On: 01/05/2023 16:23   DG Abd Portable 1V  Result Date: 01/04/2023 CLINICAL DATA:  Small-bowel obstruction. EXAM: PORTABLE ABDOMEN - 1 VIEW COMPARISON:  KUB 1 day prior, CT abdomen/pelvis 2 days prior FINDINGS: The enteric catheter tip and sidehole are in the stomach. Enteric contrast has progressed antegrade into the colon and rectum. There remains gaseous distention of loops of small bowel though this may be slightly improved compared to the study from 1 day prior. There is residual contrast in the stomach. There is no definite free intraperitoneal air, within the confines of supine technique. The imaged lung bases are clear. There is no acute osseous abnormality. IMPRESSION: Enteric contrast is passed antegrade into the colon and rectum. Gaseous distention of the small bowel may be slightly improved compared to the study from 1 day prior. Electronically Signed   By: Lesia Hausen M.D.   On: 01/04/2023 10:55   DG Abd Portable 1V-Small Bowel Obstruction Protocol-initial, 8 hr delay  Result Date: 01/03/2023 CLINICAL DATA:  9 hour small-bowel follow EXAM: PORTABLE ABDOMEN - 1 VIEW COMPARISON:  Film from the previous day. FINDINGS: Gastric catheter is noted within the stomach. Contrast material is seen within the stomach. Multiple dilated loops of small bowel are seen. Contrast is noted from prior CT in the bladder. No free air is noted. Postsurgical changes are noted. IMPRESSION: Persistent small bowel dilatation. Administered contrast lies within the stomach. Electronically Signed   By: Alcide Clever M.D.   On: 01/03/2023 03:53   DG Abd Portable 1V-Small Bowel Protocol-Position Verification  Result Date: 01/02/2023 CLINICAL DATA:  Confirm nasogastric tube EXAM: PORTABLE ABDOMEN - 1 VIEW COMPARISON:  CT abdomen and pelvis 01/02/2023 FINDINGS: Nasogastric tube tip is in the gastric fundus. Dilated small bowel loops are again seen compatible with small-bowel obstruction. IMPRESSION:  Nasogastric tube tip is in the gastric fundus. Electronically Signed   By: Darliss Cheney M.D.   On: 01/02/2023 18:50   CT ABDOMEN PELVIS W CONTRAST  Result Date: 01/02/2023 CLINICAL DATA:  Acute non localized abdominal pain. EXAM: CT ABDOMEN AND PELVIS WITH CONTRAST TECHNIQUE: Multidetector CT imaging of the abdomen and pelvis was performed using the standard protocol following bolus administration of intravenous contrast. RADIATION DOSE REDUCTION: This exam was performed according to the departmental dose-optimization program which includes automated exposure control, adjustment of the mA and/or kV according to patient size and/or use of iterative reconstruction technique. CONTRAST:  75mL OMNIPAQUE IOHEXOL 350 MG/ML SOLN COMPARISON:  03/16/2020 FINDINGS: Lower Chest: No acute findings. Hepatobiliary: No hepatic masses identified. Layering sludge or tiny gallstones noted, without evidence of cholecystitis or biliary ductal dilatation. Pancreas:  No mass or inflammatory changes. Spleen: Within normal limits in size and appearance. Adrenals/Urinary Tract: No suspicious masses identified. Multiple small less than 1 cm renal calculi seen bilaterally. No evidence of ureteral calculi or hydronephrosis. A 4 mm calculus is seen within the urinary bladder. Stomach/Bowel: Moderately dilated small bowel loops air-fluid levels are seen, with transition point in the right lateral abdomen. No mass or inflammatory process is seen at this site, and this is suspicious for adhesion. Mild sigmoid diverticulosis is noted, however there is no evidence of diverticulitis. Small amount of free fluid in pelvis, however no evidence of abscess. Vascular/Lymphatic:  No pathologically enlarged lymph nodes. No acute vascular findings. Aortic atherosclerotic calcification incidentally noted. Reproductive:  Prior prostatectomy. Other:  None. Musculoskeletal:  No suspicious bone lesions identified. IMPRESSION: Partial small bowel obstruction,  with transition point in right lateral abdomen suspicious for adhesion. Small amount of free fluid in pelvis. No evidence of abscess. Bilateral nephrolithiasis and 4 mm bladder calculus. No evidence of ureteral calculi or hydronephrosis. Cholelithiasis versus gallbladder sludge. No radiographic evidence of cholecystitis. Mild sigmoid diverticulosis, without radiographic evidence of diverticulitis. Aortic Atherosclerosis (ICD10-I70.0). Electronically Signed   By: Danae Orleans M.D.   On: 01/02/2023 15:28   MYOCARDIAL PERFUSION IMAGING  Result Date: 12/30/2022   ECG is normal. ECG rhythm shows sinus bradycardia. The ECG shows premature atrial contractions.   A pharmacological stress test was performed using IV Lexiscan 0.4mg  over 10 seconds performed without concurrent submaximal exercise. The patient reported dyspnea during the stress test. Onset of symptoms occurred at stage 0.5 of the protocol. Symptoms began at minute 30 sec during stress and ended at minute 4 during recovery. Normal blood pressure and normal heart rate response noted during stress. Heart rate recovery was normal.   No ST deviation was noted. ECG was interpretable and without significant changes. The ECG was not diagnostic due to pharmacologic protocol.   LV perfusion is abnormal. There is no evidence of ischemia. 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 Defect 1: There is a small defect with severe reduction in uptake present in the apical septal and apex location(s) that is fixed. There is normal wall motion in the defect area. Consistent with small area of infarction.   Left ventricular function is normal. Nuclear stress EF: 57 %. End diastolic cavity size is normal. End systolic cavity size is normal. No evidence of transient ischemic dilation (TID) noted.   Prior study available for comparison from 11/22/2018 with side to side comparison.  No change in perfusion compared to 2020.   Findings are consistent with no ischemia. The study is low risk.      Subjective: No acute issues/events overnight   Discharge Exam: Vitals:   01/21/23 0453 01/21/23 0929  BP: (!) 127/59 115/61  Pulse: 68 68  Resp: 19 16  Temp: 98 F (36.7 C) 98.1 F (36.7 C)  SpO2: 94% 94%   Vitals:   01/20/23 0916 01/20/23 1706 01/21/23 0453 01/21/23 0929  BP: 125/60 115/61 (!) 127/59 115/61  Pulse: 72 65 68 68  Resp: 16  19 16   Temp: 98.2 F (36.8 C) 98 F (36.7 C) 98 F (36.7 C) 98.1 F (36.7 C)  TempSrc: Oral Oral Oral Oral  SpO2: 93% 95% 94% 94%  Weight:      Height:        General: Pt is alert, awake, not in acute distress Cardiovascular: RRR, S1/S2 +, no rubs, no gallops Respiratory: CTA bilaterally, no wheezing, no rhonchi Abdominal: Soft, NT, ND, bowel sounds + Extremities: no edema, no cyanosis    The results of significant diagnostics from this hospitalization (including imaging, microbiology, ancillary and laboratory) are listed below for reference.     Microbiology: Recent Results (from the past 240 hour(s))  SARS Coronavirus 2 by RT PCR (hospital order, performed in [facility] hospital lab) *cepheid single result test* Anterior Nasal Swab     Status: None   Collection Time: 01/16/23 12:02 PM   Specimen: Anterior Nasal Swab  Result Value Ref Range Status   SARS Coronavirus 2 by RT PCR NEGATIVE NEGATIVE Final    Comment: Performed  at [facility], [address], [place], [place] [address]     Labs: BNP (last 3 results) Recent Labs    01/16/23 0555  BNP 314.1*   Basic Metabolic Panel: Recent Labs  Lab 01/16/23 0555 01/17/23 0137 01/18/23 0240 01/19/23 0325 01/20/23 0130 01/21/23 0104  NA 128* 129* 127* 131* 130* 129*  K 4.1 4.2 3.8 3.7 4.1 4.2  CL 96* 100 97* 98 98 98  CO2 21* 20* 22 22 22 23   GLUCOSE 158* 148* 193* 185* 212* 178*  BUN 39* 29* 20 19 25* 27*  CREATININE 1.92* 1.66* 1.33* 1.32* 1.35* 1.79*  CALCIUM 8.5* 8.0* 8.2* 8.4* 8.9 8.5*  MG 2.1  --  2.0 2.0 2.0 1.9  PHOS  --   --  2.4*  --   --    --    Liver Function Tests: Recent Labs  Lab 01/16/23 0555 01/17/23 0137  AST 88* 48*  ALT 88* 62*  ALKPHOS 121 102  BILITOT 1.1 0.9  PROT 5.7* 4.8*  ALBUMIN 3.1* 2.5*   Recent Labs  Lab 01/16/23 0555  LIPASE 40   No results for input(s): "AMMONIA" in the last 168 hours. CBC: Recent Labs  Lab 01/16/23 0555 01/17/23 0137 01/18/23 0240 01/19/23 0325 01/20/23 0130 01/21/23 0104  WBC 6.2 4.3 5.4 5.5 8.0 6.5  NEUTROABS 5.5  --   --   --   --   --   HGB 13.1 11.3* 12.4* 12.8* 12.5* 11.4*  HCT 38.0* 33.1* 35.6* 36.0* 35.9* 33.1*  MCV 89.8 90.2 87.9 88.5 87.3 90.4  PLT 209 182 207 228 268 293   Cardiac Enzymes: No results for input(s): "CKTOTAL", "CKMB", "CKMBINDEX", "TROPONINI" in the last 168 hours. BNP: Invalid input(s): "POCBNP" CBG: Recent Labs  Lab 01/20/23 0717 01/20/23 1144 01/20/23 1651 01/20/23 2102 01/21/23 0737  GLUCAP 207* 216* 227* 162* 189*   D-Dimer No results for input(s): "DDIMER" in the last 72 hours. Hgb A1c No results for input(s): "HGBA1C" in the last 72 hours. Lipid Profile No results for input(s): "CHOL", "HDL", "LDLCALC", "TRIG", "CHOLHDL", "LDLDIRECT" in the last 72 hours. Thyroid function studies No results for input(s): "TSH", "T4TOTAL", "T3FREE", "THYROIDAB" in the last 72 hours.  Invalid input(s): "FREET3" Anemia work up No results for input(s): "VITAMINB12", "FOLATE", "FERRITIN", "TIBC", "IRON", "RETICCTPCT" in the last 72 hours. Urinalysis    Component Value Date/Time   COLORURINE YELLOW 01/16/2023 1315   APPEARANCEUR CLEAR 01/16/2023 1315   LABSPEC 1.043 (H) 01/16/2023 1315   PHURINE 5.0 01/16/2023 1315   GLUCOSEU 50 (A) 01/16/2023 1315   HGBUR SMALL (A) 01/16/2023 1315   BILIRUBINUR NEGATIVE 01/16/2023 1315   KETONESUR 20 (A) 01/16/2023 1315   PROTEINUR 30 (A) 01/16/2023 1315   UROBILINOGEN 0.2 07/28/2011 1922   NITRITE NEGATIVE 01/16/2023 1315   LEUKOCYTESUR TRACE (A) 01/16/2023 1315   Sepsis Labs Recent Labs   Lab 01/18/23 0240 01/19/23 0325 01/20/23 0130 01/21/23 0104  WBC 5.4 5.5 8.0 6.5   Microbiology Recent Results (from the past 240 hour(s))  SARS Coronavirus 2 by RT PCR (hospital order, performed in [facility] hospital lab) *cepheid single result test* Anterior Nasal Swab     Status: None   Collection Time: 01/16/23 12:02 PM   Specimen: Anterior Nasal Swab  Result Value Ref Range Status   SARS Coronavirus 2 by RT PCR NEGATIVE NEGATIVE Final    Comment: Performed at [facility], [address] [place], [place] [address]  Time coordinating discharge: Over 30 minutes  SIGNED:   Azucena Fallen, DO [facility] 01/21/2023, 9:32 AM Pager   If 7PM-7AM, please contact night-coverage [email or web address]

## 2023-01-21 NOTE — TOC Transition Note (Signed)
Transition of Care [facility]) - CM/SW Discharge Note   Patient Details  Name: Joseph [name] MRN: [id number] Date of Birth: 1941/06/21  Transition [facility] CM/SW Contact:  Ralene Bathe, LCSW Phone Number: 01/21/2023, 11:12 AM   Clinical Narrative:    Patient will DC to: [facility] (SNF) Anticipated DC date: 01/21/2023 Family notified: Yes Transport by: [facility]   Per MD patient ready for DC to SNF. RN to call report prior to discharge [phone] room 114 ask for the 100 hall RN). RN, patient, patient's family, and facility notified of DC. Discharge Summary sent to facility. DC packet on chart. Transportation via [facility] requested for patient.  CSW will sign off for now as social work intervention is no longer needed. Please consult [place] again if new needs arise.    Final next level of care: Skilled Nursing Facility Barriers to Discharge: Barriers Resolved   Patient Goals and CMS Choice CMS Medicare.gov Compare Post Acute Care list provided to:: Patient Choice offered to / list presented to : Adult Children, Patient  Discharge Placement                Patient chooses bed at:  ([facility]) Patient to be transferred to facility by: [facility] Name of family member notified: Kathrin Penner (Daughter) [phone] Patient and family notified of of transfer: 01/21/23  Discharge Plan and Services Additional resources added to the After Visit Summary for                                       Social Determinants of Health (SDOH) Interventions SDOH Screenings   Food Insecurity: No Food Insecurity (01/16/2023)  Housing: Low Risk  (01/16/2023)  Transportation Needs: No Transportation Needs (01/16/2023)  Utilities: Not At Risk (01/16/2023)  Tobacco Use: Low Risk  (01/20/2023)     Readmission Risk Interventions    01/17/2023    3:00 PM  Readmission Risk Prevention Plan  Transportation Screening Complete  PCP or Specialist Appt  within 3-5 Days Complete  HRI or Home Care Consult Complete  Palliative Care Screening Complete  Medication Review (RN Care Manager) Referral to Pharmacy

## 2023-01-21 NOTE — Progress Notes (Signed)
Mobility Specialist Progress Note   01/21/23 1041  Mobility  Activity Ambulated with assistance in hallway  Level of Assistance Contact guard assist, steadying assist  Assistive Device Front wheel walker  Distance Ambulated (ft) 192 ft  Activity Response Tolerated well  Mobility Referral Yes  $Mobility charge 1 Mobility  Mobility Specialist Start Time (ACUTE ONLY) 1015  Mobility Specialist Stop Time (ACUTE ONLY) 1040  Mobility Specialist Time Calculation (min) (ACUTE ONLY) 25 min   Received pt in bed eager to mobilize and having no complaints. With increased time and supervision pt able to get to EOB. Able to stand w/ minG for safety and ambulate in hallways w/ CGA. Pt having a steady gait throughout hallway ambulation w/o fault, returned back to room and placed in chair w/ call bell in reach and all needs met    Frederico Hamman Mobility Specialist Please contact via SecureChat or  Rehab office at [phone]

## 2023-01-22 ENCOUNTER — Other Ambulatory Visit: Payer: Self-pay | Admitting: Cardiovascular Disease

## 2023-01-24 ENCOUNTER — Encounter: Payer: Self-pay | Admitting: Gastroenterology

## 2023-01-24 LAB — SURGICAL PATHOLOGY

## 2023-01-25 ENCOUNTER — Other Ambulatory Visit: Payer: Self-pay | Admitting: Nurse Practitioner

## 2023-01-25 MED ORDER — APIXABAN 2.5 MG PO TABS: 2.5000 mg | ORAL_TABLET | Freq: Two times a day (BID) | ORAL | 0 refills | Status: DC

## 2023-01-25 MED ORDER — SUCRALFATE 1 G PO TABS: 1.0000 g | ORAL_TABLET | Freq: Two times a day (BID) | ORAL | 0 refills | Status: DC

## 2023-01-25 NOTE — Progress Notes (Signed)
Pt called the office-OP pharmacy did not receive electronic scripts for sucralfate or apixaban. Resent to [facility] ([phone])

## 2023-01-31 DIAGNOSIS — L89152 Pressure ulcer of sacral region, stage 2: Secondary | ICD-10-CM | POA: Insufficient documentation

## 2023-02-01 ENCOUNTER — Encounter: Payer: Self-pay | Admitting: Cardiovascular Disease

## 2023-02-01 ENCOUNTER — Ambulatory Visit: Payer: Medicare Other | Attending: Cardiovascular Disease | Admitting: Cardiovascular Disease

## 2023-02-01 VITALS — BP 124/70 | HR 82 | Ht 74.0 in | Wt 204.2 lb

## 2023-02-01 DIAGNOSIS — E7800 Pure hypercholesterolemia, unspecified: Secondary | ICD-10-CM | POA: Diagnosis present

## 2023-02-01 DIAGNOSIS — I25118 Atherosclerotic heart disease of native coronary artery with other forms of angina pectoris: Secondary | ICD-10-CM | POA: Diagnosis present

## 2023-02-01 NOTE — Progress Notes (Signed)
Cardiology Office Note:    Date:  02/01/2023   ID:  Joseph [name], Joseph [name] 18-Oct-1940, MRN [id number]  PCP:  Erskine Emery, NP  Cardiologist:  Kristeen Miss, MD  Electrophysiologist:  None   Referring MD: Erskine Emery, NP   Chief Complaint  Patient presents with   Coronary Artery Disease        Hypertension          Feb. 17, 2020    Joseph [name] is a 82 y.o. male with a hx of coronary artery disease.  He is status post anterior wall myocardial infarction in 2004 treated with a stent to the LAD with a staged PCI to the right coronary artery.  Also had an non-ST segment elevation myocardial infarction in January 2016 treated treated with a DES to the mid LAD and a DES to the mid and distal circumflex artery. He also has a history of paroxysmal atrial fibrillation, hypertension, hyperlipidemia.  He is intolerant to statins.  He has a history of diabetes mellitus and a previous stroke.  He is a previous patient of Dr. Okey Dupre .  He was last seen in our office in July, 2019 by Tereso Newcomer, PA.  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  Denies any chest pain. Rides a stationary bike - does not have any issues with that  Follows a low chol diet .   Has had a rash across his body for the past 2 years .     Might be related to the onset of the Eliquis .  He thinks the rash is worse since he started Coreg.   Oct. 5, 2020 Doing well.    No CP,  Works around the yard,  Hip pain limits his exercise.  Just got through building a work shop .   He brought brought blood work from his primary medical doctor.  Vitamin D level is 40.  Sodium is 140.  Potassium is 4.8.  BUN is 34.  Creatinine is 1.24.  Liver enzymes are normal.  Thyroid profiles are normal.  Total cholesterol is 173.  LDL is 106.  Triglycerides are 160.  HDL is 33.  Hemoglobin A1c is 7.6.  He has hyperlipidemia.  He is tried numerous statins and also has tried the PCSK9 inhibitors.  He did not tolerate any of these.   He also has tried Zetia but did not tolerate it.  We will continue with diet and exercise.  Dec. 4, 2020 :  Joseph [name] is seen today in follow up for a recent hospitalization for unstable angina  Heart catheterization performed on June 11, 2019 reveals diffuse three-vessel coronary artery disease.  He was started on Ranexa 500 mg twice a day.  He was started on Plavix. No bleeding issues.  Developed some neck pain last night  Has occasional random sharp and dull pains in his chest   September 10, 2019: Joseph [name] is seen today for follow up of his 3 V cad.   Had PCI recently   He stopped taking Ranexa   Is taking imdur No CP .   Is fairly active  .   Walks on occasion    Aug. 23, 2021  Joseph [name] is seen today for follow up of his CAD;, PAF, PAD He was seen by Tereso Newcomer, PA in May . Cath in Oct. 2020 revealed diffuse 3 V cad with no targets for revascularization  His imdur was increased to 180 mg a day Still has  occasional CP.  Not exertional  Associated with weakness.   Seems to be partially relieved with getting up and moving around  Is in the Latta study   November 11, 2020 Joseph [name] is seen for follow up of his CAD, PAF, PAD He was hospitalized several weeks ago for unstable angina Cath showed mod- severe CAD - was unchanged from previous cath . Recommended medical therapy He has CKD - scheduled to get a BMP today following cath  Seems to be doing well on medical therapy  Has been taking Imdur 120 mg in the am,  30 mg in the evening   June 19. 2023  Joseph [name] is seen for follow up of his CAD, PAF, PAD  No cp, no dyspnea  Not much exercise due to hip and back pain  No angina  He wants to have back surgery if possible  I think he could have back surgery from a cardiac standpoint Would be at low - moderate risk but he is at low risk as he ever will be first    February 01, 2023:  Joseph [name] is seen for follow  up  PAF , HTN, CAD  Seen with daughter [name].   Some bowel issus  recently No CP   BP is a bit elevated.  Still eats some salt  Is on the [name] research ( Inclisiran vs. Placebo)     Past Medical History:  Diagnosis Date   Abdominal discomfort    Abdominal discomfort: EGD 2011 showed no significant abnormalities.  Possibly due to metformin.   Allergic rhinitis    Anticoagulated 05/30/2016   CAD (coronary artery disease)    a. s/p MI in 2004:  stents to the LAD and staged stent RCA, EF 55%;  b.  NSTEMI (1/16):  LHC - mid LAD stent with diff dist restenosis, prox to mid Dx mod dsz jailed by stent, small OM1 99, mCFX 75, dCFX 80, mRCA stent ok, mPDA mild to mod dsz, EF 55% >> PCI:  Synergy DES to LAD; Synergy DES (x2) mid and dist CFX   CAD in native artery 06/12/2019   Chronic kidney disease, stage 2, mildly decreased GFR    stage 2-3   DDD (degenerative disc disease)    L4 to S1 degenerative disease and spondylolisthesis: low back pain.  s/p back surgery   DM (diabetes mellitus) (HCC)    History of CVA (cerebrovascular accident)    Small CVA seen by head CT (incidental) in 1/13.  Carotid dopplers in 1/13 showed minimal disease   History of Doppler ultrasound    Arterial dopplers (3/11): no evidence for significant PAD. ABIs 10/12: Normal.     HLD (hyperlipidemia)    HTN (hypertension)    had side effects with chlorthalidone   Hx of cardiovascular stress test    Myoview (1/13): Small fixed apical septal defect with no ischemia, EF 58%.   Hx of echocardiogram    Echo (3/11): EF 60%, normal wall motion, mild MR, mild left atrial enlargement, mildly dilated ascending aorta.   PAF (paroxysmal atrial fibrillation) (HCC) 05/29/2016   Spondylolisthesis    L4-S1   Statin intolerance     Past Surgical History:  Procedure Laterality Date   BIOPSY  01/18/2023   Procedure: BIOPSY;  Surgeon: Lemar Lofty., MD;  Location: [facility] ENDOSCOPY;  Service: Gastroenterology;;   COLONOSCOPY N/A 12/26/2017   Procedure: COLONOSCOPY;  Surgeon: Benancio Deeds, MD;  Location: [facility] ENDOSCOPY;  Service: Gastroenterology;  Laterality: N/A;  COLONOSCOPY W/ POLYPECTOMY  12/2017   CORONARY ANGIOGRAM  11/13/2014   Procedure: CORONARY ANGIOGRAM;  Surgeon: Laurey Morale, MD;  Location: [facility] CATH LAB;  Service: Cardiovascular;;   CORONARY STENT INTERVENTION N/A 05/18/2019   Procedure: CORONARY STENT INTERVENTION;  Surgeon: Marykay Lex, MD;  Location: [facility] INVASIVE CV LAB;  Service: Cardiovascular;  Laterality: N/A;   CORONARY STENT PLACEMENT  2004   x2 LAD and RCA    ESOPHAGOGASTRODUODENOSCOPY N/A 01/18/2023   Procedure: ESOPHAGOGASTRODUODENOSCOPY (EGD);  Surgeon: Lemar Lofty., MD;  Location: [facility] ENDOSCOPY;  Service: Gastroenterology;  Laterality: N/A;   LEFT HEART CATH AND CORONARY ANGIOGRAPHY N/A 05/18/2019   Procedure: LEFT HEART CATH AND CORONARY ANGIOGRAPHY;  Surgeon: Marykay Lex, MD;  Location: [facility] INVASIVE CV LAB;  Service: Cardiovascular;  Laterality: N/A;   LEFT HEART CATH AND CORONARY ANGIOGRAPHY N/A 06/11/2019   Procedure: LEFT HEART CATH AND CORONARY ANGIOGRAPHY;  Surgeon: Yvonne Kendall, MD;  Location: [facility] INVASIVE CV LAB;  Service: Cardiovascular;  Laterality: N/A;   LEFT HEART CATH AND CORONARY ANGIOGRAPHY N/A 10/27/2020   Procedure: LEFT HEART CATH AND CORONARY ANGIOGRAPHY;  Surgeon: Lennette Bihari, MD;  Location: [facility] INVASIVE CV LAB;  Service: Cardiovascular;  Laterality: N/A;   LEFT HEART CATHETERIZATION WITH CORONARY ANGIOGRAM N/A 08/02/2014   Procedure: LEFT HEART CATHETERIZATION WITH CORONARY ANGIOGRAM;  Surgeon: Corky Crafts, MD;  Location: [facility] CATH LAB;  Service: Cardiovascular;  Laterality: N/A;   PROSTATE SURGERY     SPINAL FUSION     L4-S1    Current Medications: Current Meds  Medication Sig   acetaminophen (TYLENOL) 650 MG CR tablet Take 1 tablet (650 mg total) by mouth every 8 (eight) hours as needed for pain.   apixaban (ELIQUIS) 2.5 MG TABS tablet Take 1 tablet (2.5 mg total) by mouth 2 (two) times  daily.   carvedilol (COREG) 6.25 MG tablet TAKE 1 TABLET BY MOUTH TWICE A DAY (Patient taking differently: Take 6.25 mg by mouth 2 (two) times daily with a meal.)   Cholecalciferol (VITAMIN D3) 25 MCG (1000 UT) CAPS Take 1,000 Units by mouth daily.   clopidogrel (PLAVIX) 75 MG tablet TAKE 1 TABLET BY MOUTH EVERY DAY (Patient taking differently: Take 75 mg by mouth daily.)   diltiazem (CARDIZEM) 30 MG tablet Take 1 tablet (30 mg total) by mouth as needed (Rapid Palpitations).   fish oil-omega-3 fatty acids 1000 MG capsule Take 1 g by mouth at bedtime.   GALZIN 25 MG CAPS Take 1 capsule by mouth daily.   glipiZIDE (GLUCOTROL) 10 MG tablet Take 10 mg by mouth 2 (two) times daily.   isosorbide mononitrate (IMDUR) 30 MG 24 hr tablet Take 30 mg by mouth every evening.   loperamide (IMODIUM) 2 MG capsule Take 2 mg by mouth as needed for diarrhea or loose stools.   nitroGLYCERIN (NITROSTAT) 0.4 MG SL tablet PLACE 1 TAB UNDER THE TONGUE EVERY 5 MINS AS NEEDED FOR CHEST PAIN FOR UP TO 3 DOSES (Patient taking differently: Place 0.4 mg under the tongue every 5 (five) minutes as needed for chest pain.)   ondansetron (ZOFRAN) 4 MG tablet Take 1 tablet (4 mg total) by mouth every 8 (eight) hours as needed for nausea or vomiting.   oxymetazoline (AFRIN) 0.05 % nasal spray Place 2 sprays into both nostrils at bedtime.   pantoprazole (PROTONIX) 40 MG tablet Take 1 tablet (40 mg total) by mouth 2 (two) times daily before a meal.   polyethylene glycol (MIRALAX) 17 g  packet Take 17 g by mouth daily. (Patient taking differently: Take 17 g by mouth daily as needed for mild constipation.)   sucralfate (CARAFATE) 1 g tablet Take 1 tablet (1 g total) by mouth 2 (two) times daily for 14 days.   TRESIBA FLEXTOUCH 100 UNIT/ML FlexTouch Pen Inject 10 Units into the skin daily. (Patient taking differently: Inject 40-44 Units into the skin daily. Adjust based on blood sugar.)   [DISCONTINUED] amLODipine (NORVASC) 10 MG tablet Take  10 mg by mouth daily.   [DISCONTINUED] doxycycline (VIBRA-TABS) 100 MG tablet Take 1 tablet (100 mg total) by mouth every 12 (twelve) hours.   Current Facility-Administered Medications for the 02/01/23 encounter (Office Visit) with Nathanael Krist, Deloris Ping, MD  Medication   Study - [name] - inclisiran 300 mg/1.48mL or placebo SQ injection (PI-[facility]uc[facility])     Allergies:   Bempedoic acid, [name] [alirocumab], Rosuvastatin, Zetia [ezetimibe], Hydralazine, and Keflex [cephalexin]   Social History   Socioeconomic History   Marital status: Divorced    Spouse name: Not on file   Number of children: 2   Years of education: Not on file   Highest education level: Not on file  Occupational History   Occupation: retired  Tobacco Use   Smoking status: Never   Smokeless tobacco: Never  Vaping Use   Vaping status: Never Used  Substance and Sexual Activity   Alcohol use: No   Drug use: No   Sexual activity: Not Currently  Other Topics Concern   Not on file  Social History Narrative   Not on file   Social Determinants of Health   Financial Resource Strain: Not on file  Food Insecurity: No Food Insecurity (01/16/2023)   Hunger Vital Sign    Worried About Running Out of Food in the Last Year: Never true    Ran Out of Food in the Last Year: Never true  Transportation Needs: No Transportation Needs (01/16/2023)   PRAPARE - Administrator, Civil Service (Medical): No    Lack of Transportation (Non-Medical): No  Physical Activity: Not on file  Stress: Not on file  Social Connections: Not on file     Family History: The patient's family history includes Heart attack in his father and mother. There is no history of Stroke.  ROS:   Please see the history of present illness.     All other systems reviewed and are negative.  EKGs/Labs/Other Studies Reviewed:    The following studies were reviewed today:   . Recent Labs: 01/02/2023: TSH 1.982 01/16/2023: B Natriuretic Peptide  314.1 01/17/2023: ALT 62 01/21/2023: BUN 27; Creatinine, Ser 1.79; Hemoglobin 11.4; Magnesium 1.9; Platelets 293; Potassium 4.2; Sodium 129  Recent Lipid Panel    Component Value Date/Time   CHOL 156 01/04/2022 1236   TRIG 184 (H) 01/04/2022 1236   HDL 29 (L) 01/04/2022 1236   CHOLHDL 5.4 (H) 01/04/2022 1236   CHOLHDL 5.4 05/18/2019 0210   VLDL 27 05/18/2019 0210   LDLCALC 95 01/04/2022 1236   LDLDIRECT 83.0 10/28/2014 1622     ECG:     ASSESSMENT:    1. Coronary artery disease of native artery of native heart with stable angina pectoris (HCC)   2. Pure hypercholesterolemia     PLAN:    :  1.  Coronary artery disease:   s/p stenting of LAD  On the [name] research study .   No angina    2.  Paroxysmal atrial fibrillation:    On  Eliquis 2.5 BId   3.  Hyperlipidemia:  Check lipids today   .  [name]      Medication Adjustments/Labs and Tests Ordered: Current medicines are reviewed at length with the patient today.  Concerns regarding medicines are outlined above.  No orders of the defined types were placed in this encounter.  No orders of the defined types were placed in this encounter.   Patient Instructions  Medication Instructions:  STOP Amlodipine **Please call with Telmisartan/Micardis dose** *If you need a refill on your cardiac medications before your next appointment, please call your pharmacy*   Lab Work: NONE If you have labs (blood work) drawn today and your tests are completely normal, you will receive your results only by: MyChart Message (if you have MyChart) OR A paper copy in the mail If you have any lab test that is abnormal or we need to change your treatment, we will call you to review the results.  Testing/Procedures: NONE  Follow-Up: At [facility], you and your health needs are our priority.  As part of our continuing mission to provide you with exceptional heart care, we have created designated Provider Care Teams.  These  Care Teams include your primary Cardiologist (physician) and Advanced Practice Providers (APPs -  Physician Assistants and Nurse Practitioners) who all work together to provide you with the care you need, when you need it.   Your next appointment:   1 year(s)  Provider:   Kristeen Miss, MD        Signed, Kristeen Miss, MD  02/01/2023 6:24 PM    [facility]

## 2023-02-01 NOTE — Patient Instructions (Addendum)
Medication Instructions:  STOP Amlodipine **Please call with Telmisartan/Micardis dose** *If you need a refill on your cardiac medications before your next appointment, please call your pharmacy*   Lab Work: NONE If you have labs (blood work) drawn today and your tests are completely normal, you will receive your results only by: MyChart Message (if you have MyChart) OR A paper copy in the mail If you have any lab test that is abnormal or we need to change your treatment, we will call you to review the results.  Testing/Procedures: NONE  Follow-Up: At [facility], you and your health needs are our priority.  As part of our continuing mission to provide you with exceptional heart care, we have created designated Provider Care Teams.  These Care Teams include your primary Cardiologist (physician) and Advanced Practice Providers (APPs -  Physician Assistants and Nurse Practitioners) who all work together to provide you with the care you need, when you need it.   Your next appointment:   1 year(s)  Provider:   Kristeen Miss, MD

## 2023-02-04 ENCOUNTER — Other Ambulatory Visit: Payer: Self-pay | Admitting: Cardiovascular Disease

## 2023-02-09 DIAGNOSIS — R7989 Other specified abnormal findings of blood chemistry: Secondary | ICD-10-CM | POA: Insufficient documentation

## 2023-02-17 DIAGNOSIS — R1011 Right upper quadrant pain: Secondary | ICD-10-CM | POA: Insufficient documentation

## 2023-02-17 DIAGNOSIS — K5900 Constipation, unspecified: Secondary | ICD-10-CM | POA: Insufficient documentation

## 2023-02-22 ENCOUNTER — Encounter: Payer: Self-pay | Admitting: Physician Assistant

## 2023-04-05 DIAGNOSIS — M7021 Olecranon bursitis, right elbow: Secondary | ICD-10-CM | POA: Insufficient documentation

## 2023-04-05 DIAGNOSIS — L609 Nail disorder, unspecified: Secondary | ICD-10-CM | POA: Insufficient documentation

## 2023-04-27 ENCOUNTER — Other Ambulatory Visit: Payer: Self-pay | Admitting: Podiatry

## 2023-04-27 ENCOUNTER — Encounter: Payer: Self-pay | Admitting: Podiatry

## 2023-04-27 ENCOUNTER — Ambulatory Visit (INDEPENDENT_AMBULATORY_CARE_PROVIDER_SITE_OTHER): Payer: Medicare Other | Admitting: Podiatry

## 2023-04-27 DIAGNOSIS — B351 Tinea unguium: Secondary | ICD-10-CM

## 2023-04-27 DIAGNOSIS — M79675 Pain in left toe(s): Secondary | ICD-10-CM

## 2023-04-27 DIAGNOSIS — M79674 Pain in right toe(s): Secondary | ICD-10-CM | POA: Diagnosis not present

## 2023-04-27 DIAGNOSIS — B353 Tinea pedis: Secondary | ICD-10-CM

## 2023-04-27 DIAGNOSIS — E1151 Type 2 diabetes mellitus with diabetic peripheral angiopathy without gangrene: Secondary | ICD-10-CM

## 2023-04-27 MED ORDER — OXISTAT 1 % EX LOTN: TOPICAL_LOTION | Freq: Two times a day (BID) | CUTANEOUS | 2 refills | Status: DC

## 2023-04-27 NOTE — Progress Notes (Signed)
Subjective:  Patient ID: Joseph [name], male    DOB: 1940-11-10,  MRN: [id number]   Joseph [name] presents to clinic today for:  Chief Complaint  Patient presents with   Diabetes    Routine Foot Care. Nails. A1c ~ 7. Reports subjective numbness and tingling. DM history at least 20 years  . Patient notes nails are thick, discolored, elongated and painful in shoegear when trying to ambulate.  Notes chronic athlete's foot in the toe webs.  He doesn't have anything at home he uses for this.  Recent blood sugars have been averaging in the 200s.    PCP is Erskine Emery, NP.  Past Medical History:  Diagnosis Date   Abdominal discomfort    Abdominal discomfort: EGD 2011 showed no significant abnormalities.  Possibly due to metformin.   Allergic rhinitis    Anticoagulated 05/30/2016   CAD (coronary artery disease)    a. s/p MI in 2004:  stents to the LAD and staged stent RCA, EF 55%;  b.  NSTEMI (1/16):  LHC - mid LAD stent with diff dist restenosis, prox to mid Dx mod dsz jailed by stent, small OM1 99, mCFX 75, dCFX 80, mRCA stent ok, mPDA mild to mod dsz, EF 55% >> PCI:  Synergy DES to LAD; Synergy DES (x2) mid and dist CFX   CAD in native artery 06/12/2019   Chronic kidney disease, stage 2, mildly decreased GFR    stage 2-3   DDD (degenerative disc disease)    L4 to S1 degenerative disease and spondylolisthesis: low back pain.  s/p back surgery   DM (diabetes mellitus) (HCC)    History of CVA (cerebrovascular accident)    Small CVA seen by head CT (incidental) in 1/13.  Carotid dopplers in 1/13 showed minimal disease   History of Doppler ultrasound    Arterial dopplers (3/11): no evidence for significant PAD. ABIs 10/12: Normal.     HLD (hyperlipidemia)    HTN (hypertension)    had side effects with chlorthalidone   Hx of cardiovascular stress test    Myoview (1/13): Small fixed apical septal defect with no ischemia, EF 58%.   Hx of echocardiogram    Echo (3/11): EF 60%,  normal wall motion, mild MR, mild left atrial enlargement, mildly dilated ascending aorta.   PAF (paroxysmal atrial fibrillation) (HCC) 05/29/2016   Spondylolisthesis    L4-S1   Statin intolerance     Past Surgical History:  Procedure Laterality Date   BIOPSY  01/18/2023   Procedure: BIOPSY;  Surgeon: Lemar Lofty., MD;  Location: [facility] ENDOSCOPY;  Service: Gastroenterology;;   COLONOSCOPY N/A 12/26/2017   Procedure: COLONOSCOPY;  Surgeon: Benancio Deeds, MD;  Location: [facility] ENDOSCOPY;  Service: Gastroenterology;  Laterality: N/A;   COLONOSCOPY W/ POLYPECTOMY  12/2017   CORONARY ANGIOGRAM  11/13/2014   Procedure: CORONARY ANGIOGRAM;  Surgeon: Laurey Morale, MD;  Location: [facility] CATH LAB;  Service: Cardiovascular;;   CORONARY STENT INTERVENTION N/A 05/18/2019   Procedure: CORONARY STENT INTERVENTION;  Surgeon: Marykay Lex, MD;  Location: [facility] INVASIVE CV LAB;  Service: Cardiovascular;  Laterality: N/A;   CORONARY STENT PLACEMENT  2004   x2 LAD and RCA    ESOPHAGOGASTRODUODENOSCOPY N/A 01/18/2023   Procedure: ESOPHAGOGASTRODUODENOSCOPY (EGD);  Surgeon: Lemar Lofty., MD;  Location: [facility] ENDOSCOPY;  Service: Gastroenterology;  Laterality: N/A;   LEFT HEART CATH AND CORONARY ANGIOGRAPHY N/A 05/18/2019   Procedure: LEFT HEART CATH AND CORONARY ANGIOGRAPHY;  Surgeon: Marykay Lex, MD;  Location: [facility] INVASIVE CV LAB;  Service: Cardiovascular;  Laterality: N/A;   LEFT HEART CATH AND CORONARY ANGIOGRAPHY N/A 06/11/2019   Procedure: LEFT HEART CATH AND CORONARY ANGIOGRAPHY;  Surgeon: Yvonne Kendall, MD;  Location: [facility] INVASIVE CV LAB;  Service: Cardiovascular;  Laterality: N/A;   LEFT HEART CATH AND CORONARY ANGIOGRAPHY N/A 10/27/2020   Procedure: LEFT HEART CATH AND CORONARY ANGIOGRAPHY;  Surgeon: Lennette Bihari, MD;  Location: [facility] INVASIVE CV LAB;  Service: Cardiovascular;  Laterality: N/A;   LEFT HEART CATHETERIZATION WITH CORONARY ANGIOGRAM N/A 08/02/2014   Procedure: LEFT  HEART CATHETERIZATION WITH CORONARY ANGIOGRAM;  Surgeon: Corky Crafts, MD;  Location: [facility] CATH LAB;  Service: Cardiovascular;  Laterality: N/A;   PROSTATE SURGERY     SPINAL FUSION     L4-S1    Allergies  Allergen Reactions   Bempedoic Acid Other (See Comments)    NEXLETOL Myalgias, GI upset   [name] [Alirocumab] Other (See Comments)    Muscle weakness and rash   Rosuvastatin Other (See Comments)    Low energy/leg and hip pain   Zetia [Ezetimibe] Other (See Comments)    myalgias   Hydralazine Other (See Comments)    Muscle Aches   Keflex [Cephalexin] Other (See Comments)    Reaction unk   Review of Systems: Negative except as noted in the HPI.  Objective:  Joseph [name] is a pleasant 82 y.o. male in NAD. AAO x 3.  Vascular Examination: Capillary refill time is 3-5 seconds to toes bilateral. Trace palpable pedal pulses b/l LE. Digital hair absent b/l.  Skin temperature gradient WNL b/l.   Dermatological Examination: Pedal skin with normal turgor, texture and tone b/l. No open wounds. No interdigital macerations b/l. Toenails x10 are 3mm thick, discolored, dystrophic with subungual debris. There is pain with compression of the nail plates.  They are elongated x10.  Maceration and peeling in the 3rd and 4th interspaces bilateral.    Neurological Examination: Protective sensation intact bilateral LE.   Musculoskeletal Examination: Muscle strength 5/5 to all LE muscle groups b/l.   Assessment/Plan: 1. Pain due to onychomycosis of toenails of both feet   2. Tinea pedis of both feet   3. Type II diabetes mellitus with peripheral circulatory disorder (HCC)     Meds ordered this encounter  Medications   DISCONTD: oxiconazole (OXISTAT) 1 % lotion    Sig: Apply topically 2 (two) times daily. Apply between toes daily    Dispense:  30 mL    Refill:  2   The mycotic toenails were sharply debrided x10 with sterile nail nippers and a power debriding burr to decrease  bulk/thickness and length.    Rx topical antifungal to patient's pharmacy to apply between toes daily for 4 weeks.  Return in about 3 months (around 07/28/2023) for [facility].   Clerance Lav, DPM, FACFAS [facility]     [address] [place], [place] [address]                Office [phone]  Fax [phone]

## 2023-04-28 ENCOUNTER — Other Ambulatory Visit: Payer: Self-pay | Admitting: Podiatry

## 2023-04-28 DIAGNOSIS — D72829 Elevated white blood cell count, unspecified: Secondary | ICD-10-CM | POA: Insufficient documentation

## 2023-04-28 DIAGNOSIS — B353 Tinea pedis: Secondary | ICD-10-CM

## 2023-04-28 MED ORDER — KETOCONAZOLE 2 % EX CREA
1.0000 | TOPICAL_CREAM | Freq: Every day | CUTANEOUS | 2 refills | Status: DC
Start: 2023-04-28 — End: 2023-10-27

## 2023-04-28 MED ORDER — NAFTIN 1 % EX GEL: 1.0000 | Freq: Two times a day (BID) | CUTANEOUS | 1 refills | Status: DC

## 2023-04-30 ENCOUNTER — Encounter: Payer: Self-pay | Admitting: Podiatry

## 2023-05-10 ENCOUNTER — Telehealth: Payer: Self-pay

## 2023-05-10 ENCOUNTER — Ambulatory Visit (INDEPENDENT_AMBULATORY_CARE_PROVIDER_SITE_OTHER): Payer: Medicare Other | Admitting: Physician Assistant

## 2023-05-10 ENCOUNTER — Encounter: Payer: Self-pay | Admitting: Physician Assistant

## 2023-05-10 ENCOUNTER — Other Ambulatory Visit: Payer: Self-pay

## 2023-05-10 ENCOUNTER — Telehealth: Payer: Self-pay | Admitting: *Deleted

## 2023-05-10 VITALS — BP 120/60 | HR 66 | Ht 74.0 in | Wt 217.0 lb

## 2023-05-10 DIAGNOSIS — I480 Paroxysmal atrial fibrillation: Secondary | ICD-10-CM

## 2023-05-10 DIAGNOSIS — Z7901 Long term (current) use of anticoagulants: Secondary | ICD-10-CM

## 2023-05-10 DIAGNOSIS — D132 Benign neoplasm of duodenum: Secondary | ICD-10-CM | POA: Diagnosis not present

## 2023-05-10 DIAGNOSIS — I2510 Atherosclerotic heart disease of native coronary artery without angina pectoris: Secondary | ICD-10-CM | POA: Diagnosis not present

## 2023-05-10 DIAGNOSIS — K2980 Duodenitis without bleeding: Secondary | ICD-10-CM

## 2023-05-10 MED ORDER — OMEPRAZOLE 40 MG PO CPDR: 40.0000 mg | DELAYED_RELEASE_CAPSULE | Freq: Two times a day (BID) | ORAL | 3 refills | Status: DC

## 2023-05-10 NOTE — Telephone Encounter (Signed)
Name: Joseph [name]  DOB: 1940-10-28  MRN: [id number]  Primary Cardiologist: Kristeen Miss, MD   Preoperative team, please contact this patient and set up a phone call appointment for further preoperative risk assessment. Please obtain consent and complete medication review. Thank you for your help.  I confirm that guidance regarding antiplatelet and oral anticoagulation therapy has been completed and, if necessary, noted below.   Patient can hold Eliquis for 2 days prior to procedure as requested.   I also confirmed the patient resides in the state of [place]. As per [facility] Medical Board telemedicine laws, the patient must reside in the state in which the provider is licensed.   Ronney Asters, NP 05/10/2023, 2:54 PM [facility]

## 2023-05-10 NOTE — Telephone Encounter (Signed)
EGD has been set up for 06/13/23 at 1245 pm at [facility] with GM   Left message on machine to call back    FYI PJ I have the pt set up and a call out to him to go over the instructions.

## 2023-05-10 NOTE — Telephone Encounter (Signed)
Pt has been scheduled for tele pre op appt 08/02/22. Med rec and consent are done.

## 2023-05-10 NOTE — Telephone Encounter (Signed)
I have to find a spot. Probably late Dec or January

## 2023-05-10 NOTE — Telephone Encounter (Signed)
EGD scheduled, pt instructed and medications reviewed.  Patient instructions mailed to home.  Patient to call with any questions or concerns.  

## 2023-05-10 NOTE — Progress Notes (Signed)
Chief Complaint: Follow-up hospitalization for abdominal pain  HPI:    Joseph [name] is an 82 year old male with a past medical history as listed below, assigned to Dr. Meridee Score at last hospitalization, CAD with history of stent placement on Plavix, PAF on Eliquis LVEF 55-60% who presents to clinic today for follow-up after hospitalization for abdominal pain.     01/16/23-01/21/23 hospitalization for small bowel obstruction.  Time of admission noted patient had been admitted to the hospital 2 weeks prior for small bowel obstruction treated with NG tube and conservative measures and eventually discharged home.  After getting home patient developed intermittent nausea and vomiting for 5 days prior to admission came to the ED and had CT showing panniculitis and was discharged home again.  Last admission patient had a CT showing a 5 mm bladder stone.  Eventually had EGD which showed duodenitis/duodenal ulcer and started on PPI and Carafate.    01/18/2023 EGD with 2 cm hiatal hernia, erythematous mucosa in the antrum, duodenitis and a single duodenal polypoid lesion.  Patient told to use a PPI 40 mg twice daily and Carafate twice daily for a month.  Discussed that if polyp returns with adenomatous tissue that would need repeat EGD with the primary gastroenterologist in [place] versus in [place] within the next 3 to 6 months.  Recommended follow-up EGD in 4 months regardless for duodenal ulcers.  Pathology showed mild reactive changes.  Duodenal adenoma.    Today, patient presents to clinic accompanied by family member.  Explains that he has been good for the past 2 weeks he did have 1 night of epigastric discomfort but overall things are much better.  He did stop his Carafate and Pantoprazole as he felt like this was giving him A. flutter and is currently only on Pepcid 40 mg at bedtime and over-the-counter Prilosec 20 mg once daily.  Asked some questions about his duodenal adenoma.    Also discusses chronic  constipation.  Apparently treats this with a stool softener every 3 days or so.  He does have MiraLAX at home which he is going to try instead when he runs out of Dulcolax.    Patient has had to hold his Plavix and Eliquis in the past.    Denies fever, chills, weight loss or blood in his stool.  Past Medical History:  Diagnosis Date   Abdominal discomfort    Abdominal discomfort: EGD 2011 showed no significant abnormalities.  Possibly due to metformin.   AKI (acute kidney injury) (HCC)    Allergic rhinitis    Anticoagulated 05/30/2016   CAD (coronary artery disease)    a. s/p MI in 2004:  stents to the LAD and staged stent RCA, EF 55%;  b.  NSTEMI (1/16):  LHC - mid LAD stent with diff dist restenosis, prox to mid Dx mod dsz jailed by stent, small OM1 99, mCFX 75, dCFX 80, mRCA stent ok, mPDA mild to mod dsz, EF 55% >> PCI:  Synergy DES to LAD; Synergy DES (x2) mid and dist CFX   CAD in native artery 06/12/2019   Chronic kidney disease, stage 2, mildly decreased GFR    stage 2-3   DDD (degenerative disc disease)    L4 to S1 degenerative disease and spondylolisthesis: low back pain.  s/p back surgery   DM (diabetes mellitus) (HCC)    History of CVA (cerebrovascular accident)    Small CVA seen by head CT (incidental) in 1/13.  Carotid dopplers in 1/13 showed minimal disease  History of Doppler ultrasound    Arterial dopplers (3/11): no evidence for significant PAD. ABIs 10/12: Normal.     HLD (hyperlipidemia)    HTN (hypertension)    had side effects with chlorthalidone   Hx of cardiovascular stress test    Myoview (1/13): Small fixed apical septal defect with no ischemia, EF 58%.   Hx of echocardiogram    Echo (3/11): EF 60%, normal wall motion, mild MR, mild left atrial enlargement, mildly dilated ascending aorta.   PAF (paroxysmal atrial fibrillation) (HCC) 05/29/2016   Panniculitis    SBO (small bowel obstruction) (HCC)    Spondylolisthesis    L4-S1   Statin intolerance      Past Surgical History:  Procedure Laterality Date   BIOPSY  01/18/2023   Procedure: BIOPSY;  Surgeon: Lemar Lofty., MD;  Location: [facility] ENDOSCOPY;  Service: Gastroenterology;;   COLONOSCOPY N/A 12/26/2017   Procedure: COLONOSCOPY;  Surgeon: Benancio Deeds, MD;  Location: [facility] ENDOSCOPY;  Service: Gastroenterology;  Laterality: N/A;   COLONOSCOPY W/ POLYPECTOMY  12/2017   CORONARY ANGIOGRAM  11/13/2014   Procedure: CORONARY ANGIOGRAM;  Surgeon: Laurey Morale, MD;  Location: [facility] CATH LAB;  Service: Cardiovascular;;   CORONARY STENT INTERVENTION N/A 05/18/2019   Procedure: CORONARY STENT INTERVENTION;  Surgeon: Marykay Lex, MD;  Location: [facility] INVASIVE CV LAB;  Service: Cardiovascular;  Laterality: N/A;   CORONARY STENT PLACEMENT  2004   x2 LAD and RCA    ESOPHAGOGASTRODUODENOSCOPY N/A 01/18/2023   Procedure: ESOPHAGOGASTRODUODENOSCOPY (EGD);  Surgeon: Lemar Lofty., MD;  Location: [facility] ENDOSCOPY;  Service: Gastroenterology;  Laterality: N/A;   LEFT HEART CATH AND CORONARY ANGIOGRAPHY N/A 05/18/2019   Procedure: LEFT HEART CATH AND CORONARY ANGIOGRAPHY;  Surgeon: Marykay Lex, MD;  Location: [facility] INVASIVE CV LAB;  Service: Cardiovascular;  Laterality: N/A;   LEFT HEART CATH AND CORONARY ANGIOGRAPHY N/A 06/11/2019   Procedure: LEFT HEART CATH AND CORONARY ANGIOGRAPHY;  Surgeon: Yvonne Kendall, MD;  Location: [facility] INVASIVE CV LAB;  Service: Cardiovascular;  Laterality: N/A;   LEFT HEART CATH AND CORONARY ANGIOGRAPHY N/A 10/27/2020   Procedure: LEFT HEART CATH AND CORONARY ANGIOGRAPHY;  Surgeon: Lennette Bihari, MD;  Location: [facility] INVASIVE CV LAB;  Service: Cardiovascular;  Laterality: N/A;   LEFT HEART CATHETERIZATION WITH CORONARY ANGIOGRAM N/A 08/02/2014   Procedure: LEFT HEART CATHETERIZATION WITH CORONARY ANGIOGRAM;  Surgeon: Corky Crafts, MD;  Location[facility] CATH LAB;  Service: Cardiovascular;  Laterality: N/A;   PROSTATE SURGERY     SPINAL FUSION     L4-S1     Current Outpatient Medications  Medication Sig Dispense Refill   acetaminophen (TYLENOL) 650 MG CR tablet Take 1 tablet (650 mg total) by mouth every 8 (eight) hours as needed for pain.     amLODipine (NORVASC) 5 MG tablet Take 5 mg by mouth daily.     apixaban (ELIQUIS) 2.5 MG TABS tablet Take 1 tablet (2.5 mg total) by mouth 2 (two) times daily. 60 tablet 0   carvedilol (COREG) 6.25 MG tablet TAKE 1 TABLET BY MOUTH TWICE A DAY (Patient taking differently: Take 6.25 mg by mouth 2 (two) times daily with a meal.) 180 tablet 1   Cholecalciferol (VITAMIN D3) 25 MCG (1000 UT) CAPS Take 1,000 Units by mouth daily.     clopidogrel (PLAVIX) 75 MG tablet TAKE 1 TABLET BY MOUTH EVERY DAY 90 tablet 3   diltiazem (CARDIZEM) 30 MG tablet Take 1 tablet (30 mg total) by mouth as needed (Rapid  Palpitations). 30 tablet 5   famotidine (PEPCID) 40 MG tablet Take 40 mg by mouth at bedtime.     fish oil-omega-3 fatty acids 1000 MG capsule Take 1 g by mouth at bedtime.     GALZIN 25 MG CAPS Take 1 capsule by mouth daily.     glipiZIDE (GLUCOTROL) 10 MG tablet Take 10 mg by mouth 2 (two) times daily.     isosorbide mononitrate (IMDUR) 30 MG 24 hr tablet Take 30 mg by mouth every evening.     ketoconazole (NIZORAL) 2 % cream Apply 1 Application topically daily. Apply between toes once daily 60 g 2   loperamide (IMODIUM) 2 MG capsule Take 2 mg by mouth as needed for diarrhea or loose stools.     NAFTIN 1 % GEL APPLY TOPICALLY IN THE MORNING AND AT BEDTIME 60 g 1   nitroGLYCERIN (NITROSTAT) 0.4 MG SL tablet PLACE 1 TAB UNDER THE TONGUE EVERY 5 MINS AS NEEDED FOR CHEST PAIN FOR UP TO 3 DOSES (Patient taking differently: Place 0.4 mg under the tongue every 5 (five) minutes as needed for chest pain.) 25 tablet 3   nystatin cream (MYCOSTATIN) Apply topically 2 (two) times daily.     ondansetron (ZOFRAN) 4 MG tablet Take 1 tablet (4 mg total) by mouth every 8 (eight) hours as needed for nausea or vomiting. 30 tablet 1    oxymetazoline (AFRIN) 0.05 % nasal spray Place 2 sprays into both nostrils at bedtime.     pantoprazole (PROTONIX) 40 MG tablet Take 1 tablet (40 mg total) by mouth 2 (two) times daily before a meal.     polyethylene glycol (MIRALAX) 17 g packet Take 17 g by mouth daily. (Patient taking differently: Take 17 g by mouth daily as needed for mild constipation.) 14 each 0   telmisartan (MICARDIS) 20 MG tablet Take 1 tablet (20 mg total) by mouth daily. 30 tablet 0   TRESIBA FLEXTOUCH 100 UNIT/ML FlexTouch Pen Inject 10 Units into the skin daily. (Patient taking differently: Inject 40-44 Units into the skin daily. Adjust based on blood sugar.)     triamcinolone cream (KENALOG) 0.1 % Apply 1 Application topically 2 (two) times daily.     sucralfate (CARAFATE) 1 g tablet Take 1 tablet (1 g total) by mouth 2 (two) times daily for 14 days. 28 tablet 0   Current Facility-Administered Medications  Medication Dose Route Frequency Provider Last Rate Last Admin   Study - [name] - inclisiran 300 mg/1.33mL or placebo SQ injection (PI-Stuckey)  300 mg Subcutaneous Q6 months Herby Abraham, MD   300 mg at 12/22/22 1116    Allergies as of 05/10/2023 - Review Complete 05/10/2023  Allergen Reaction Noted   Bempedoic acid Other (See Comments) 03/21/2019   [name] [alirocumab] Other (See Comments) 07/24/2018   Rosuvastatin Other (See Comments) 04/06/2017   Zetia [ezetimibe] Other (See Comments) 08/02/2017   Hydralazine Other (See Comments) 06/01/2021   Keflex [cephalexin] Other (See Comments) 10/27/2020    Family History  Problem Relation Age of Onset   Heart attack Mother    Heart attack Father    Stroke Neg Hx     Social History   Socioeconomic History   Marital status: Divorced    Spouse name: Not on file   Number of children: 2   Years of education: Not on file   Highest education level: Not on file  Occupational History   Occupation: retired  Tobacco Use   Smoking status: Never  Smokeless  tobacco: Never  Vaping Use   Vaping status: Never Used  Substance and Sexual Activity   Alcohol use: No   Drug use: No   Sexual activity: Not Currently  Other Topics Concern   Not on file  Social History Narrative   Not on file   Social Determinants of Health   Financial Resource Strain: Not on file  Food Insecurity: No Food Insecurity (01/16/2023)   Hunger Vital Sign    Worried About Running Out of Food in the Last Year: Never true    Ran Out of Food in the Last Year: Never true  Transportation Needs: No Transportation Needs (01/16/2023)   PRAPARE - Administrator, Civil Service (Medical): No    Lack of Transportation (Non-Medical): No  Physical Activity: Not on file  Stress: Not on file  Social Connections: Not on file  Intimate Partner Violence: Not At Risk (01/16/2023)   Humiliation, Afraid, Rape, and Kick questionnaire    Fear of Current or Ex-Partner: No    Emotionally Abused: No    Physically Abused: No    Sexually Abused: No    Review of Systems:    Constitutional: No weight loss, fever or chills Cardiovascular: No chest pain Respiratory: No SOB  Gastrointestinal: See HPI and otherwise negative   Physical Exam:  Vital signs: BP 120/60   Pulse 66   Ht 6\' 2"  (1.88 m)   Wt 217 lb (98.4 kg)   BMI 27.86 kg/m    Constitutional:   Pleasant elderly Caucasian male appears to be in NAD, Well developed, Well nourished, alert and cooperative Respiratory: Respirations even and unlabored. Lungs clear to auscultation bilaterally.   No wheezes, crackles, or rhonchi.  Cardiovascular: Normal S1, S2. No MRG. Regular rate and rhythm. No peripheral edema, cyanosis or pallor.  Gastrointestinal:  Soft, nondistended, nontender. No rebound or guarding. Normal bowel sounds. No appreciable masses or hepatomegaly. Rectal:  Not performed.  Psychiatric: Demonstrates good judgement and reason without abnormal affect or behaviors.  RELEVANT LABS AND IMAGING: CBC     Component Value Date/Time   WBC 6.5 01/21/2023 0104   RBC 3.66 (L) 01/21/2023 0104   HGB 11.4 (L) 01/21/2023 0104   HCT 33.1 (L) 01/21/2023 0104   PLT 293 01/21/2023 0104   MCV 90.4 01/21/2023 0104   MCH 31.1 01/21/2023 0104   MCHC 34.4 01/21/2023 0104   RDW 12.6 01/21/2023 0104   LYMPHSABS 0.3 (L) 01/16/2023 0555   MONOABS 0.3 01/16/2023 0555   EOSABS 0.1 01/16/2023 0555   BASOSABS 0.0 01/16/2023 0555    CMP     Component Value Date/Time   NA 129 (L) 01/21/2023 0104   NA 136 11/11/2020 1239   K 4.2 01/21/2023 0104   CL 98 01/21/2023 0104   CO2 23 01/21/2023 0104   GLUCOSE 178 (H) 01/21/2023 0104   BUN 27 (H) 01/21/2023 0104   BUN 27 11/11/2020 1239   CREATININE 1.79 (H) 01/21/2023 0104   CREATININE 1.22 (H) 04/05/2016 1601   CALCIUM 8.5 (L) 01/21/2023 0104   PROT 4.8 (L) 01/17/2023 0137   PROT 6.6 07/21/2018 0747   ALBUMIN 2.5 (L) 01/17/2023 0137   ALBUMIN 4.6 07/21/2018 0747   AST 48 (H) 01/17/2023 0137   ALT 62 (H) 01/17/2023 0137   ALKPHOS 102 01/17/2023 0137   BILITOT 0.9 01/17/2023 0137   BILITOT 0.6 07/21/2018 0747   GFRNONAA 37 (L) 01/21/2023 0104   GFRAA 50 (L) 03/16/2020 1919    Assessment:  1.  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 Meridee Score 2.  Duodenitis: Seen at time of EGD in the hospital, repeat recommended to check on healing 3.  CAD and PAF on Plavix and Eliquis  Plan: 1.  Scheduled patient for an EGD in the [facility] with Dr. Meridee Score for removal of duodenal adenoma and to check on status of duodenitis.  Did provide the patient a detailed list of risks for procedures and he agrees to proceed.  I believe patient is safe for procedures in the [facility] but will send a note to [place] our anesthesiologist to ensure that this is the case as well as make sure that Dr. Meridee Score feels comfortable doing the procedure there. 2.  Patient was advised that he would need to hold his Eliquis for 2 days and his Plavix for  5 days prior to time of procedure.  We will communicate with his prescribing physician to ensure this is acceptable for him. 3.  Expressed to the patient that I prefer that he is on increased dose of Prilosec, stenting Omeprazole 40 mg twice daily, #60 with 5 refills.  Hopefully this will actually help his duodenitis heal. 4.  Patient to follow in clinic per recommendations from Dr. Meridee Score after time of procedure  Hyacinth Meeker, PA-C [facility] 05/10/2023, 8:51 AM  Cc: Erskine Emery, NP

## 2023-05-10 NOTE — Telephone Encounter (Signed)
PJ do I need to send the anticoag letter?  I will get him rescheduled.

## 2023-05-10 NOTE — Patient Instructions (Signed)
We have sent the following medications to your pharmacy for you to pick up at your convenience: Omeprazole  You have been scheduled for an endoscopy. Please follow written instructions given to you at your visit today.  If you use inhalers (even only as needed), please bring them with you on the day of your procedure.  If you take any of the following medications, they will need to be adjusted prior to your procedure:   DO NOT TAKE 7 DAYS PRIOR TO TEST- Trulicity (dulaglutide) Ozempic, Wegovy (semaglutide) Mounjaro (tirzepatide) Bydureon Bcise (exanatide extended release)  DO NOT TAKE 1 DAY PRIOR TO YOUR TEST Rybelsus (semaglutide) Adlyxin (lixisenatide) Victoza (liraglutide) Byetta (exanatide) ___________________________________________________________________________    I appreciate the opportunity to care for you. Hyacinth Meeker PA-C

## 2023-05-10 NOTE — Telephone Encounter (Signed)
Pt has been scheduled for tele pre op appt 05/24/23. Med rec and consent are done.     Patient Consent for Virtual Visit        Joseph [name] has provided verbal consent on 05/10/2023 for a virtual visit (video or telephone).   CONSENT FOR VIRTUAL VISIT FOR:  Joseph [name]  By participating in this virtual visit I agree to the following:  I hereby voluntarily request, consent and authorize [facility] and its employed or contracted physicians, physician assistants, nurse practitioners or other licensed health care professionals (the Practitioner), to provide me with telemedicine health care services (the "Services") as deemed necessary by the treating Practitioner. I acknowledge and consent to receive the Services by the Practitioner via telemedicine. I understand that the telemedicine visit will involve communicating with the Practitioner through live audiovisual communication technology and the disclosure of certain medical information by electronic transmission. I acknowledge that I have been given the opportunity to request an in-person assessment or other available alternative prior to the telemedicine visit and am voluntarily participating in the telemedicine visit.  I understand that I have the right to withhold or withdraw my consent to the use of telemedicine in the course of my care at any time, without affecting my right to future care or treatment, and that the Practitioner or I may terminate the telemedicine visit at any time. I understand that I have the right to inspect all information obtained and/or recorded in the course of the telemedicine visit and may receive copies of available information for a reasonable fee.  I understand that some of the potential risks of receiving the Services via telemedicine include:  Delay or interruption in medical evaluation due to technological equipment failure or disruption; Information transmitted may not be sufficient (e.g. poor  resolution of images) to allow for appropriate medical decision making by the Practitioner; and/or  In rare instances, security protocols could fail, causing a breach of personal health information.  Furthermore, I acknowledge that it is my responsibility to provide information about my medical history, conditions and care that is complete and accurate to the best of my ability. I acknowledge that Practitioner's advice, recommendations, and/or decision may be based on factors not within their control, such as incomplete or inaccurate data provided by me or distortions of diagnostic images or specimens that may result from electronic transmissions. I understand that the practice of medicine is not an exact science and that Practitioner makes no warranties or guarantees regarding treatment outcomes. I acknowledge that a copy of this consent can be made available to me via my patient portal [facility] MyChart), or I can request a printed copy by calling the office of [place] [facility].    I understand that my insurance will be billed for this visit.   I have read or had this consent read to me. I understand the contents of this consent, which adequately explains the benefits and risks of the Services being provided via telemedicine.  I have been provided ample opportunity to ask questions regarding this consent and the Services and have had my questions answered to my satisfaction. I give my informed consent for the services to be provided through the use of telemedicine in my medical care

## 2023-05-10 NOTE — Telephone Encounter (Signed)
Inbound call from patient returning phone call. Please advise, thank you.  

## 2023-05-10 NOTE — Telephone Encounter (Signed)
Patient with diagnosis of afib on Eliquis for anticoagulation.    Procedure: EGD Date of procedure: TBD  CHA2DS2-VASc Score = 7  This indicates a 11.2% annual risk of stroke. The patient's score is based upon: CHF History: 0 HTN History: 1 Diabetes History: 1 Stroke History: 2 Vascular Disease History: 1 Age Score: 2 Gender Score: 0   CVA 2013. Also with prior GI bleed.  CrCl 31mL/min Platelet count 293K  Patient can hold Eliquis for 2 days prior to procedure as requested.    **This guidance is not considered finalized until pre-operative APP has relayed final recommendations.**

## 2023-05-10 NOTE — Telephone Encounter (Signed)
-----   Message from Unk Lightning sent at 05/10/2023 10:18 AM EDT ----- Regarding: FW: EGD Lanett Lasorsa can you please take care of this,  I am working with PJ today and she is overwhelmed already.  Can you call this patient and reschedule his procedure for the hospital-based setting with Dr. Meridee Score.  He was seen in clinic today.  Thanks, JL L ----- Message ----- From: Lemar Lofty., MD Sent: 05/10/2023  10:04 AM EDT To: Cathlyn Parsons, CRNA; Unk Lightning, PA Subject: RE: EGD                                        JLL, To have my tools and my clips, he should be done in the hospital-based setting. Thanks. GM ----- Message ----- From: Unk Lightning, PA Sent: 05/10/2023   9:45 AM EDT To: Cathlyn Parsons, CRNA; Lemar Lofty., MD Subject: EGD                                            Dr. Meliton Rattan wanted to make sure you are okay with doing this patient's duodenal adenoma removal in the [facility].  It is scheduled that way for now but can be changed if you feel necessary.  Nalty-would you mind checking things out as well on your end?  Thanks everybody!  Hyacinth Meeker, PA-C

## 2023-05-10 NOTE — Telephone Encounter (Signed)
I will send the anti-coag letter now. What date will you try to r/s to?

## 2023-05-10 NOTE — Telephone Encounter (Signed)
[facility] Pre-operative Risk Assessment     Request for surgical clearance:     Endoscopy Procedure  What type of surgery is being performed?     EGD  When is this surgery scheduled?     TBD  What type of clearance is required ?   Pharmacy  Are there any medications that need to be held prior to surgery and how long? Eliquis 2 days, Plavix 5 days  Practice name and name of physician performing surgery?      [facility]  What is your office phone and fax number?      Phone- [phone]  Fax- [phone]  Anesthesia type (None, local, MAC, general) ?       MAC

## 2023-05-13 NOTE — Telephone Encounter (Signed)
Pt requesting to have televisit switched to in office visit. Requesting cb to discuss further

## 2023-05-13 NOTE — Telephone Encounter (Signed)
Returned patient's call and reschedule him to 10/28 with Sdr. Nahser at 1:40 pm. Patient verbalized understanding and thanked me for the call.

## 2023-05-15 ENCOUNTER — Encounter: Payer: Self-pay | Admitting: Cardiovascular Disease

## 2023-05-15 NOTE — Progress Notes (Unsigned)
Cardiology Office Note:    Date:  05/16/2023   ID:  Joseph [name] [birth year], MRN [id number]  PCP:  Joseph Emery, NP  Cardiologist:  Joseph Miss, MD  Electrophysiologist:  None   Referring MD: Joseph Emery, NP   Chief Complaint  Patient presents with   Callouses   Hypertension        Atrial Fibrillation          Feb. 17, 2020    Joseph [name] is a 82 y.o. male with a hx of coronary artery disease.  He is status post anterior wall myocardial infarction in 2004 treated with a stent to the LAD with a staged PCI to the right coronary artery.  Also had an non-ST segment elevation myocardial infarction in January 2016 treated treated with a DES to the mid LAD and a DES to the mid and distal circumflex artery. He also has a history of paroxysmal atrial fibrillation, hypertension, hyperlipidemia.  He is intolerant to statins.  He has a history of diabetes mellitus and a previous stroke.  He is a previous patient of Dr. Okey [name] .  He was last seen in our office in July, 2019 by Joseph Newcomer, PA.  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  Denies any chest pain. Rides a stationary bike - does not have any issues with that  Follows a low chol diet .   Has had a rash across his body for the past 2 years .     Might be related to the onset of the Eliquis .  He thinks the rash is worse since he started Coreg.   Oct. 5, 2020 Doing well.    No CP,  Works around the yard,  Hip pain limits his exercise.  Just got through building a work shop .   He brought brought blood work from his primary medical doctor.  Vitamin D level is 40.  Sodium is 140.  Potassium is 4.8.  BUN is 34.  Creatinine is 1.24.  Liver enzymes are normal.  Thyroid profiles are normal.  Total cholesterol is 173.  LDL is 106.  Triglycerides are 160.  HDL is 33.  Hemoglobin A1c is 7.6.  He has hyperlipidemia.  He is tried numerous statins and also has tried the PCSK9 inhibitors.  He did not tolerate any  of these.  He also has tried Zetia but did not tolerate it.  We will continue with diet and exercise.  Dec. 4, 2020 :  Joseph [name] is seen today in follow up for a recent hospitalization for unstable angina  Heart catheterization performed on June 11, 2019 reveals diffuse three-vessel coronary artery disease.  He was started on Ranexa 500 mg twice a day.  He was started on Plavix. No bleeding issues.  Developed some neck pain last night  Has occasional random sharp and dull pains in his chest   September 10, 2019: Joseph [name] is seen today for follow up of his 3 V cad.   Had PCI recently   He stopped taking Ranexa   Is taking imdur No CP .   Is fairly active  .   Walks on occasion    Aug. 23, 2021  Joseph [name] is seen today for follow up of his CAD;, PAF, PAD He was seen by Joseph Newcomer, PA in May . Cath in Oct. 2020 revealed diffuse 3 V cad with no targets for revascularization  His imdur was increased to 180 mg a day  Still has occasional CP.  Not exertional  Associated with weakness.   Seems to be partially relieved with getting up and moving around  Is in the [place] study   November 11, 2020 Joseph [name] is seen for follow up of his CAD, PAF, PAD He was hospitalized several weeks ago for unstable angina Cath showed mod- severe CAD - was unchanged from previous cath . Recommended medical therapy He has CKD - scheduled to get a BMP today following cath  Seems to be doing well on medical therapy  Has been taking Imdur 120 mg in the am,  30 mg in the evening   June 19. 2023  Joseph [name] is seen for follow up of his CAD, PAF, PAD  No cp, no dyspnea  Not much exercise due to hip and back pain  No angina  He wants to have back surgery if possible  I think he could have back surgery from a cardiac standpoint Would be at low - moderate risk but he is at low risk as he ever will be first    February 01, 2023:  Joseph [name] is seen for follow  up  PAF , HTN, CAD  Seen with daughter Joseph [name].   Some bowel issus  recently No CP   BP is a bit elevated.  Still eats some salt  Is on the [facility] 4 research ( Inclisiran vs. Placebo)    Oct. 28, 2024 Joseph [name] is seen for follow up of his CAD, PAF, PAD Needs to have EGD with removal of a polyp  Is on Eliquis 2.5 BID for his atrial fib.  His creatinine had increased to 1.79 upon discharge from the hospital.  His baseline creatinine looks to be lower than that.  Will recheck basic metabolic profile today to assess his correct Eliquis dosing.   He is at low risk for his upcoming EGD with polypectomy.  He may hold his Eliquis for 2 or 3 days prior to the procedure.  He should restart the Eliquis as soon as it is safe from a GI bleeding standpoint.   Past Medical History:  Diagnosis Date   Abdominal discomfort    Abdominal discomfort: EGD 2011 showed no significant abnormalities.  Possibly due to metformin.   AKI (acute kidney injury) (HCC)    Allergic rhinitis    Anticoagulated 05/30/2016   CAD (coronary artery disease)    a. s/p MI in 2004:  stents to the LAD and staged stent RCA, EF 55%;  b.  NSTEMI (1/16):  LHC - mid LAD stent with diff dist restenosis, prox to mid Dx mod dsz jailed by stent, small OM1 99, mCFX 75, dCFX 80, mRCA stent ok, mPDA mild to mod dsz, EF 55% >> PCI:  Synergy DES to LAD; Synergy DES (x2) mid and dist CFX   CAD in native artery 06/12/2019   Chronic kidney disease, stage 2, mildly decreased GFR    stage 2-3   DDD (degenerative disc disease)    L4 to S1 degenerative disease and spondylolisthesis: low back pain.  s/p back surgery   DM (diabetes mellitus) (HCC)    History of CVA (cerebrovascular accident)    Small CVA seen by head CT (incidental) in 1/13.  Carotid dopplers in 1/13 showed minimal disease   History of Doppler ultrasound    Arterial dopplers (3/11): no evidence for significant PAD. ABIs 10/12: Normal.     HLD (hyperlipidemia)    HTN (hypertension)    had side effects with chlorthalidone   Hx  of cardiovascular  stress test    Myoview (1/13): Small fixed apical septal defect with no ischemia, EF 58%.   Hx of echocardiogram    Echo (3/11): EF 60%, normal wall motion, mild MR, mild left atrial enlargement, mildly dilated ascending aorta.   PAF (paroxysmal atrial fibrillation) (HCC) 05/29/2016   Panniculitis    SBO (small bowel obstruction) (HCC)    Spondylolisthesis    L4-S1   Statin intolerance     Past Surgical History:  Procedure Laterality Date   BIOPSY  01/18/2023   Procedure: BIOPSY;  Surgeon: Lemar Lofty., MD;  Location: [facility] ENDOSCOPY;  Service: Gastroenterology;;   COLONOSCOPY N/A 12/26/2017   Procedure: COLONOSCOPY;  Surgeon: Benancio Deeds, MD;  Location: [facility] ENDOSCOPY;  Service: Gastroenterology;  Laterality: N/A;   COLONOSCOPY W/ POLYPECTOMY  12/2017   CORONARY ANGIOGRAM  11/13/2014   Procedure: CORONARY ANGIOGRAM;  Surgeon: Laurey Morale, MD;  Location[facility] CATH LAB;  Service: Cardiovascular;;   CORONARY STENT INTERVENTION N/A 05/18/2019   Procedure: CORONARY STENT INTERVENTION;  Surgeon: Marykay Lex, MD;  Location: [facility] INVASIVE CV LAB;  Service: Cardiovascular;  Laterality: N/A;   CORONARY STENT PLACEMENT  2004   x2 LAD and RCA    ESOPHAGOGASTRODUODENOSCOPY N/A 01/18/2023   Procedure: ESOPHAGOGASTRODUODENOSCOPY (EGD);  Surgeon: Lemar Lofty., MD;  Location[facility] ENDOSCOPY;  Service: Gastroenterology;  Laterality: N/A;   LEFT HEART CATH AND CORONARY ANGIOGRAPHY N/A 05/18/2019   Procedure: LEFT HEART CATH AND CORONARY ANGIOGRAPHY;  Surgeon: Marykay Lex, MD;  Location: [facility] INVASIVE CV LAB;  Service: Cardiovascular;  Laterality: N/A;   LEFT HEART CATH AND CORONARY ANGIOGRAPHY N/A 06/11/2019   Procedure: LEFT HEART CATH AND CORONARY ANGIOGRAPHY;  Surgeon: Yvonne Kendall, MD;  Location: [facility] INVASIVE CV LAB;  Service: Cardiovascular;  Laterality: N/A;   LEFT HEART CATH AND CORONARY ANGIOGRAPHY N/A 10/27/2020   Procedure: LEFT HEART CATH AND CORONARY ANGIOGRAPHY;   Surgeon: Lennette Bihari, MD;  Location: [facility] INVASIVE CV LAB;  Service: Cardiovascular;  Laterality: N/A;   LEFT HEART CATHETERIZATION WITH CORONARY ANGIOGRAM N/A 08/02/2014   Procedure: LEFT HEART CATHETERIZATION WITH CORONARY ANGIOGRAM;  Surgeon: Corky Crafts, MD;  Location: [facility] CATH LAB;  Service: Cardiovascular;  Laterality: N/A;   PROSTATE SURGERY     SPINAL FUSION     L4-S1    Current Medications: Current Meds  Medication Sig   acetaminophen (TYLENOL) 650 MG CR tablet Take 1 tablet (650 mg total) by mouth every 8 (eight) hours as needed for pain.   amLODipine (NORVASC) 5 MG tablet Take 5 mg by mouth daily.   apixaban (ELIQUIS) 2.5 MG TABS tablet Take 1 tablet (2.5 mg total) by mouth 2 (two) times daily.   carvedilol (COREG) 6.25 MG tablet TAKE 1 TABLET BY MOUTH TWICE A DAY (Patient taking differently: Take 6.25 mg by mouth 2 (two) times daily with a meal.)   Cholecalciferol (VITAMIN D3) 25 MCG (1000 UT) CAPS Take 1,000 Units by mouth daily.   clopidogrel (PLAVIX) 75 MG tablet TAKE 1 TABLET BY MOUTH EVERY DAY   diltiazem (CARDIZEM) 30 MG tablet Take 1 tablet (30 mg total) by mouth as needed (Rapid Palpitations).   famotidine (PEPCID) 40 MG tablet Take 40 mg by mouth at bedtime.   fish oil-omega-3 fatty acids 1000 MG capsule Take 1 g by mouth at bedtime.   GALZIN 25 MG CAPS Take 1 capsule by mouth daily.   glipiZIDE (GLUCOTROL) 10 MG tablet Take 10 mg by mouth 2 (  two) times daily.   isosorbide mononitrate (IMDUR) 30 MG 24 hr tablet Take 30 mg by mouth every evening.   ketoconazole (NIZORAL) 2 % cream Apply 1 Application topically daily. Apply between toes once daily   loperamide (IMODIUM) 2 MG capsule Take 2 mg by mouth as needed for diarrhea or loose stools.   NAFTIN 1 % GEL APPLY TOPICALLY IN THE MORNING AND AT BEDTIME   nitroGLYCERIN (NITROSTAT) 0.4 MG SL tablet PLACE 1 TAB UNDER THE TONGUE EVERY 5 MINS AS NEEDED FOR CHEST PAIN FOR UP TO 3 DOSES   nystatin cream (MYCOSTATIN)  Apply topically 2 (two) times daily.   omeprazole (PRILOSEC) 40 MG capsule Take 1 capsule (40 mg total) by mouth 2 (two) times daily before a meal. Take 30 minutes prior to meals   ondansetron (ZOFRAN) 4 MG tablet Take 1 tablet (4 mg total) by mouth every 8 (eight) hours as needed for nausea or vomiting.   oxymetazoline (AFRIN) 0.05 % nasal spray Place 2 sprays into both nostrils at bedtime.   polyethylene glycol (MIRALAX) 17 g packet Take 17 g by mouth daily.   telmisartan (MICARDIS) 20 MG tablet Take 1 tablet (20 mg total) by mouth daily.   TRESIBA FLEXTOUCH 100 UNIT/ML FlexTouch Pen Inject 10 Units into the skin daily. (Patient taking differently: Inject 40-44 Units into the skin daily. Adjust based on blood sugar.)   triamcinolone cream (KENALOG) 0.1 % Apply 1 Application topically 2 (two) times daily.   Current Facility-Administered Medications for the 05/16/23 encounter (Office Visit) with Ilian Wessell, Deloris Ping, MD  Medication   Study - [name] - inclisiran 300 mg/1.64mL or placebo SQ injection ([facility])     Allergies:   Bempedoic acid, [name] [alirocumab], Rosuvastatin, Zetia [ezetimibe], Hydralazine, and Keflex [cephalexin]   Social History   Socioeconomic History   Marital status: Divorced    Spouse name: Not on file   Number of children: 2   Years of education: Not on file   Highest education level: Not on file  Occupational History   Occupation: retired  Tobacco Use   Smoking status: Never   Smokeless tobacco: Never  Vaping Use   Vaping status: Never Used  Substance and Sexual Activity   Alcohol use: No   Drug use: No   Sexual activity: Not Currently  Other Topics Concern   Not on file  Social History Narrative   Not on file   Social Determinants of Health   Financial Resource Strain: Not on file  Food Insecurity: No Food Insecurity (01/16/2023)   Hunger Vital Sign    Worried About Running Out of Food in the Last Year: Never true    Ran Out of Food in the Last  Year: Never true  Transportation Needs: No Transportation Needs (01/16/2023)   PRAPARE - Administrator, Civil Service (Medical): No    Lack of Transportation (Non-Medical): No  Physical Activity: Not on file  Stress: Not on file  Social Connections: Not on file     Family History: The patient's family history includes Heart attack in his father and mother. There is no history of Stroke.  ROS:   Please see the history of present illness.     All other systems reviewed and are negative.  EKGs/Labs/Other Studies Reviewed:    The following studies were reviewed today:   . Recent Labs: 01/02/2023: TSH 1.982 01/16/2023: B Natriuretic Peptide 314.1 01/17/2023: ALT 62 01/21/2023: BUN 27; Creatinine, Ser 1.79; Hemoglobin 11.4; Magnesium 1.9;  Platelets 293; Potassium 4.2; Sodium 129  Recent Lipid Panel    Component Value Date/Time   CHOL 156 01/04/2022 1236   TRIG 184 (H) 01/04/2022 1236   HDL 29 (L) 01/04/2022 1236   CHOLHDL 5.4 (H) 01/04/2022 1236   CHOLHDL 5.4 05/18/2019 0210   VLDL 27 05/18/2019 0210   LDLCALC 95 01/04/2022 1236   LDLDIRECT 83.0 10/28/2014 1622     ECG:   EKG Interpretation Date/Time:  Monday May 16 2023 13:38:19 EDT Ventricular Rate:  64 PR Interval:  220 QRS Duration:  106 QT Interval:  388 QTC Calculation: 400 R Axis:   -21  Text Interpretation: Sinus rhythm with 1st degree A-V block Septal infarct , age undetermined When compared with ECG of 16-Jan-2023 08:15, pt has converted to sinus rhythm since previous ecg Confirmed by Joseph [name] ([id number]) on 05/16/2023 1:52:41 PM    ASSESSMENT:    1. Paroxysmal atrial fibrillation (HCC)      PLAN:    :  1.  Coronary artery disease:   s/p stenting of LAD he denies any chest pain.     2.  Paroxysmal atrial fibrillation:   He is in normal sinus rhythm today.  His Eliquis dose was reduced to 2.5 mg twice daily after his last hospitalization.  Will recheck a basic metabolic profile today.   It is quite possible that his creatinine has improved since that time which would mean that we should increase his Eliquis back up to the 5 mg twice a day.  He is at low risk for his upcoming EGD with polypectomy.  He may hold his Eliquis for 2 to 3 days prior to the EGD.  He should restart the Eliquis as soon as it is safe from a bleeding standpoint.     3.  Hyperlipidemia:   cont current meds     Medication Adjustments/Labs and Tests Ordered: Current medicines are reviewed at length with the patient today.  Concerns regarding medicines are outlined above.  Orders Placed This Encounter  Procedures   EKG 12-Lead   No orders of the defined types were placed in this encounter.   There are no Patient Instructions on file for this visit.   Signed, Joseph Miss, MD  05/16/2023 1:45 PM    [facility]

## 2023-05-16 ENCOUNTER — Encounter: Payer: Self-pay | Admitting: Cardiovascular Disease

## 2023-05-16 ENCOUNTER — Ambulatory Visit: Payer: Medicare Other | Attending: Cardiovascular Disease | Admitting: Cardiovascular Disease

## 2023-05-16 VITALS — BP 136/72 | HR 70 | Ht 74.0 in | Wt 213.4 lb

## 2023-05-16 DIAGNOSIS — R748 Abnormal levels of other serum enzymes: Secondary | ICD-10-CM | POA: Diagnosis present

## 2023-05-16 DIAGNOSIS — I480 Paroxysmal atrial fibrillation: Secondary | ICD-10-CM | POA: Diagnosis not present

## 2023-05-16 NOTE — Telephone Encounter (Signed)
Karron is at low risk for his upcoming EGD with polypectomy.  He may hold his Eliquis for 2 to 3 days prior to the procedure.  He should restart his Eliquis as soon as it is safe from a bleeding standpoint.    Kristeen Miss, MD  05/16/2023 1:58 PM    [facility] [address] [place],  [address] [place], [place]  [address] Phone: [phone]; Fax: [phone]

## 2023-05-16 NOTE — Patient Instructions (Signed)
Lab Work: BMET today If you have labs (blood work) drawn today and your tests are completely normal, you will receive your results only by: [employer] (if you have MyChart) OR A paper copy in the mail If you have any lab test that is abnormal or we need to change your treatment, we will call you to review the results.  Follow-Up: At [facility], you and your health needs are our priority.  As part of our continuing mission to provide you with exceptional heart care, we have created designated Provider Care Teams.  These Care Teams include your primary Cardiologist (physician) and Advanced Practice Providers (APPs -  Physician Assistants and Nurse Practitioners) who all work together to provide you with the care you need, when you need it.  Your next appointment:   1 year(s)  Provider:   Dietrich Pates, MD

## 2023-05-17 LAB — BASIC METABOLIC PANEL
BUN/Creatinine Ratio: 34 — ABNORMAL HIGH (ref 10–24)
BUN: 32 mg/dL — ABNORMAL HIGH (ref 8–27)
CO2: 23 mmol/L (ref 20–29)
Calcium: 9.8 mg/dL (ref 8.6–10.2)
Chloride: 102 mmol/L (ref 96–106)
Creatinine, Ser: 0.93 mg/dL (ref 0.76–1.27)
Glucose: 213 mg/dL — ABNORMAL HIGH (ref 70–99)
Potassium: 4.9 mmol/L (ref 3.5–5.2)
Sodium: 138 mmol/L (ref 134–144)
eGFR: 81 mL/min/{1.73_m2} (ref >59)

## 2023-05-24 ENCOUNTER — Telehealth: Payer: Medicare Other

## 2023-05-27 NOTE — Telephone Encounter (Signed)
Do we have clearance for 5 day hold on his Plavix ? Thanks for your time.

## 2023-05-27 NOTE — Telephone Encounter (Signed)
Good Morning Dr. Elease Hashimoto  Can you please comment on guidance for  holding Plavix for 5 days prior to colonoscopy.  Patient has history of overlapping stent in the RCA.  Please forward you guidance and recommendations to P CV DIV PREOP

## 2023-05-27 NOTE — Telephone Encounter (Signed)
   [name]  DOB: 10-21-1940 MRN: [id number]  Primary Cardiologist: Kristeen Miss, MD  Clinical pharmacists have reviewed the patient's past medical history, labs, and current medications as part of preoperative protocol coverage. The following recommendations have been made:   Patient is cleared to hold Eliquis 2 days prior to procedure and can also hold Plavix 5 days prior to procedure.  I will route this recommendation to the requesting party via Epic fax function and remove from pre-op pool.  Please call with questions.  Napoleon Form, Leodis Rains, NP 05/27/2023, 4:46 PM

## 2023-06-02 NOTE — Telephone Encounter (Signed)
Patient verbalized understanding to hold his Plavix 5 days prior to EGD and his Eliquis 2 days prior.

## 2023-06-02 NOTE — Telephone Encounter (Signed)
I left Joseph [name] a message to call me back to go over holding his plavix and eliquis.

## 2023-06-06 ENCOUNTER — Encounter (HOSPITAL_COMMUNITY): Payer: Self-pay | Admitting: Gastroenterology

## 2023-06-06 NOTE — Progress Notes (Signed)
Attempted to obtain medical history for pre op call via telephone, unable to reach at this time. HIPAA compliant voicemail message left requesting return call to pre surgical testing department.

## 2023-06-07 ENCOUNTER — Telehealth: Payer: Self-pay

## 2023-06-07 NOTE — Telephone Encounter (Signed)
Joseph [name] called in to report he has a sinus infection, started yesterday. He is going to the Doctor tomorrow. No fever. He wants to make [place] aware and wants to know if he can still get his EGD 06/13/2023 at the hospital. Please advise Sir.

## 2023-06-07 NOTE — Telephone Encounter (Signed)
As long as patient is not having significant cough or reactive airway disease, I think that it is unlikely he would be canceled.  It is when patients have significant URI or recent pneumonia where anesthesia has more concerns.  Anesthesia will have to make a final decision based on how the patient is doing when he comes in for the procedure. If he just wants to wait, then that is certainly up to the patient, but he will need to be rescheduled for likely February or March for procedures based on my availability. Let me know what he decides. Thanks. GM

## 2023-06-08 DIAGNOSIS — R0981 Nasal congestion: Secondary | ICD-10-CM | POA: Insufficient documentation

## 2023-06-08 DIAGNOSIS — R519 Headache, unspecified: Secondary | ICD-10-CM | POA: Insufficient documentation

## 2023-06-08 NOTE — Telephone Encounter (Signed)
I spoke to Joseph [name] and he said they are rx'ing a z-pak for him to take. He doesn't have COVID. No fever. He feels like he is getting better. He wants to stay on the books.

## 2023-06-08 NOTE — Telephone Encounter (Signed)
Glad to hear this. Will see him for procedure next week. GM

## 2023-06-13 ENCOUNTER — Encounter (HOSPITAL_COMMUNITY): Payer: Self-pay | Admitting: Gastroenterology

## 2023-06-13 ENCOUNTER — Encounter (HOSPITAL_COMMUNITY)
Admission: RE | Disposition: A | Discharge status: Home / Self Care | Payer: Self-pay | Source: Home / Self Care | Attending: Gastroenterology

## 2023-06-13 ENCOUNTER — Ambulatory Visit (HOSPITAL_COMMUNITY)
Admission: RE | Admit: 2023-06-13 | Discharge: 2023-06-13 | Disposition: A | Discharge status: Home / Self Care | Payer: Medicare Other | Attending: Gastroenterology | Admitting: Gastroenterology

## 2023-06-13 ENCOUNTER — Ambulatory Visit (HOSPITAL_BASED_OUTPATIENT_CLINIC_OR_DEPARTMENT_OTHER): Payer: Medicare Other | Admitting: Certified Registered Nurse Anesthetist

## 2023-06-13 ENCOUNTER — Other Ambulatory Visit: Payer: Self-pay

## 2023-06-13 ENCOUNTER — Ambulatory Visit (HOSPITAL_COMMUNITY): Payer: Medicare Other | Admitting: Certified Registered Nurse Anesthetist

## 2023-06-13 DIAGNOSIS — I2510 Atherosclerotic heart disease of native coronary artery without angina pectoris: Secondary | ICD-10-CM | POA: Diagnosis not present

## 2023-06-13 DIAGNOSIS — K449 Diaphragmatic hernia without obstruction or gangrene: Secondary | ICD-10-CM | POA: Diagnosis not present

## 2023-06-13 DIAGNOSIS — I480 Paroxysmal atrial fibrillation: Secondary | ICD-10-CM | POA: Insufficient documentation

## 2023-06-13 DIAGNOSIS — E1151 Type 2 diabetes mellitus with diabetic peripheral angiopathy without gangrene: Secondary | ICD-10-CM | POA: Insufficient documentation

## 2023-06-13 DIAGNOSIS — K2980 Duodenitis without bleeding: Secondary | ICD-10-CM | POA: Insufficient documentation

## 2023-06-13 DIAGNOSIS — I129 Hypertensive chronic kidney disease with stage 1 through stage 4 chronic kidney disease, or unspecified chronic kidney disease: Secondary | ICD-10-CM | POA: Diagnosis not present

## 2023-06-13 DIAGNOSIS — E1122 Type 2 diabetes mellitus with diabetic chronic kidney disease: Secondary | ICD-10-CM | POA: Insufficient documentation

## 2023-06-13 DIAGNOSIS — N182 Chronic kidney disease, stage 2 (mild): Secondary | ICD-10-CM | POA: Diagnosis not present

## 2023-06-13 DIAGNOSIS — Z955 Presence of coronary angioplasty implant and graft: Secondary | ICD-10-CM | POA: Diagnosis not present

## 2023-06-13 DIAGNOSIS — K2289 Other specified disease of esophagus: Secondary | ICD-10-CM | POA: Diagnosis not present

## 2023-06-13 DIAGNOSIS — D132 Benign neoplasm of duodenum: Secondary | ICD-10-CM

## 2023-06-13 HISTORY — PX: HEMOSTASIS CLIP PLACEMENT: SHX6857

## 2023-06-13 HISTORY — PX: SUBMUCOSAL TATTOO INJECTION: SHX6856

## 2023-06-13 HISTORY — PX: SUBMUCOSAL LIFTING INJECTION: SHX6855

## 2023-06-13 HISTORY — PX: ENDOSCOPIC MUCOSAL RESECTION: SHX6839

## 2023-06-13 HISTORY — PX: ESOPHAGOGASTRODUODENOSCOPY (EGD) WITH PROPOFOL: SHX5813

## 2023-06-13 LAB — GLUCOSE, CAPILLARY
Glucose-Capillary: 82 mg/dL (ref 70–99)
Glucose-Capillary: 79 mg/dL (ref 70–99)

## 2023-06-13 SURGERY — ESOPHAGOGASTRODUODENOSCOPY (EGD) WITH PROPOFOL
Anesthesia: Monitor Anesthesia Care

## 2023-06-13 MED ORDER — SPOT INK MARKER SYRINGE KIT
PACK | SUBMUCOSAL | Status: AC
  Filled 2023-06-13: qty 5

## 2023-06-13 MED ORDER — SUCRALFATE 1 G PO TABS: 1.0000 g | ORAL_TABLET | Freq: Two times a day (BID) | ORAL | 0 refills | Status: AC

## 2023-06-13 MED ORDER — METHYLENE BLUE (ANTIDOTE) 1 % IV SOLN
INTRAVENOUS | Status: AC
  Filled 2023-06-13: qty 10

## 2023-06-13 MED ORDER — LIDOCAINE HCL (CARDIAC) PF 100 MG/5ML IV SOSY
PREFILLED_SYRINGE | INTRAVENOUS | Status: DC | PRN
  Administered 2023-06-13: 100 mg via INTRAVENOUS

## 2023-06-13 MED ORDER — PROPOFOL 10 MG/ML IV BOLUS
INTRAVENOUS | Status: DC | PRN
  Administered 2023-06-13: 70 ug/kg/min via INTRAVENOUS
  Administered 2023-06-13: 40 mg via INTRAVENOUS
  Administered 2023-06-13: 30 mg via INTRAVENOUS
  Administered 2023-06-13: 50 mg via INTRAVENOUS
  Administered 2023-06-13: 20 mg via INTRAVENOUS

## 2023-06-13 MED ORDER — GLYCOPYRROLATE 0.2 MG/ML IJ SOLN
INTRAMUSCULAR | Status: DC | PRN
  Administered 2023-06-13: .1 mg via INTRAVENOUS

## 2023-06-13 MED ORDER — SPOT INK MARKER SYRINGE KIT
PACK | SUBMUCOSAL | Status: DC | PRN
  Administered 2023-06-13: 1.5 mL via SUBMUCOSAL

## 2023-06-13 MED ORDER — CLOPIDOGREL BISULFATE 75 MG PO TABS: 75.0000 mg | ORAL_TABLET | Freq: Every day | ORAL | Status: AC

## 2023-06-13 MED ORDER — APIXABAN 2.5 MG PO TABS: 2.5000 mg | ORAL_TABLET | Freq: Two times a day (BID) | ORAL | Status: AC

## 2023-06-13 MED ORDER — SODIUM CHLORIDE 0.9 % IV SOLN: INTRAVENOUS | Status: DC | PRN

## 2023-06-13 MED ORDER — OMEPRAZOLE 40 MG PO CPDR: 40.0000 mg | DELAYED_RELEASE_CAPSULE | Freq: Two times a day (BID) | ORAL | 6 refills | Status: AC

## 2023-06-13 SURGICAL SUPPLY — 14 items

## 2023-06-13 NOTE — Op Note (Signed)
[facility] [name] Procedure Date: 06/13/2023 MRN: [id number] Attending MD: Corliss Parish , MD, [id number] Date of Birth: 12-Jun-1941 CSN: [id number] Age: 82 Admit Type: Outpatient Procedure:                Upper GI endoscopy Indications:              Polyps in the duodenum, For therapy of polyps in                            the duodenum Providers:                Corliss Parish, MD, Fransisca Connors, Kandice Robinsons, Technician Referring MD:             Corliss Parish, MD Medicines:                Monitored Anesthesia Care Complications:            No immediate complications. Estimated Blood Loss:     Estimated blood loss was minimal. Procedure:                Pre-Anesthesia Assessment:                           - Prior to the procedure, a History and Physical                            was performed, and patient medications and                            allergies were reviewed. The patient's tolerance of                            previous anesthesia was also reviewed. The risks                            and benefits of the procedure and the sedation                            options and risks were discussed with the patient.                            All questions were answered, and informed consent                            was obtained. Prior Anticoagulants: The patient                            last took Eliquis (apixaban) 2 days and Plavix                            (clopidogrel) 5 days prior to the procedure. ASA  Grade Assessment: III - A patient with severe                            systemic disease. After reviewing the risks and                            benefits, the patient was deemed in satisfactory                            condition to undergo the procedure.                           After obtaining informed consent, the endoscope was                             passed under direct vision. Throughout the                            procedure, the patient's blood pressure, pulse, and                            oxygen saturations were monitored continuously. The                            [id number] ([id number] Olympus therapeutic endoscope                            was introduced through the mouth, and advanced to                            the second part of duodenum. The upper GI endoscopy                            was accomplished without difficulty. The patient                            tolerated the procedure. Scope In: Scope Out: Findings:      No gross lesions were noted in the entire esophagus.      The Z-line was irregular and was found 42 cm from the incisors.      A 1 cm hiatal hernia was present.      No gross lesions were noted in the entire examined stomach.      Patchy mild inflammation characterized by erythema and granularity was       found in the duodenal bulb, in the first portion of the duodenum and in       the second portion of the duodenum. This is significantly improved from       prior endoscopy.      A single 24 mm sessile polyp was found in the second portion of the       duodenum (directly across from the major papilla). Preparations were       made for attempt at mucosal resection. Demarcation of the lesion was       performed with high-definition white light and narrow band imaging to  clearly identify the boundaries of the lesion. Endoclot was injected to       raise the lesion. Piecemeal mucosal resection using a snare was       performed. Resection and retrieval were complete. Resected tissue       margins were examined and clear of polyp tissue. To prevent bleeding       after mucosal resection, five hemostatic clips were successfully placed       (MR conditional). Clip manufacturer: [employer]. There was no       bleeding at the end of the procedure. Area proximal to the resection       site was  tattooed with an injection of Spot (carbon black) for       demarcation purposes. Impression:               - No gross lesions in the entire esophagus. Z-line                            irregular, 42 cm from the incisors.                           - 1 cm hiatal hernia.                           - No gross lesions in the entire stomach.                           - Duodenitis. Improved from prior endoscopy.                           - A single duodenal polyp in D2 (across from the                            major papilla). Resected and retrieved via                            piecemeal EMR. Clips (MR conditional) were placed.                            Clip manufacturer: [employer]. Tattooed                            proximal to defect. Moderate Sedation:      Not Applicable - Patient had care per Anesthesia. Recommendation:           - The patient will be observed post-procedure,                            until all discharge criteria are met.                           - Discharge patient to home.                           - Patient has a contact number available for  emergencies. The signs and symptoms of potential                            delayed complications were discussed with the                            patient. Return to normal activities tomorrow.                            Written discharge instructions were provided to the                            patient.                           - Full liquid diet today and then may advance to                            soft diet tomorrow.                           - Continue twice daily PPI for next 2 months then                            may decrease to once daily, pending patient's                            symptomatology.                           - Carafate 1 g twice daily for 2 weeks then may                            stop.                           - Observe patient's clinical course.                            - Await pathology results.                           - Likely repeat endoscopy in 1 year, pending                            patient's overall health and medical comorbidities                            at the time.                           - Monitor for signs/symptoms of bleeding,                            perforation, and infection. If issues please call  our number to get further assistance as needed. Procedure Code(s):        --- Professional ---                           [phone], Esophagogastroduodenoscopy, flexible,                            transoral; with endoscopic mucosal resection Diagnosis Code(s):        --- Professional ---                           K22.89, Other specified disease of esophagus                           K44.9, Diaphragmatic hernia without obstruction or                            gangrene                           K29.80, Duodenitis without bleeding                           K31.7, Polyp of stomach and duodenum CPT copyright 2022 [employer]. All rights reserved. The codes documented in this report are preliminary and upon coder review may  be revised to meet current compliance requirements. Corliss Parish, MD 06/13/2023 2:49:43 PM Number of Addenda: 0

## 2023-06-13 NOTE — Anesthesia Procedure Notes (Signed)
Procedure Name: General with mask airway Date/Time: 06/13/2023 1:59 PM  Performed by: Floydene Flock, CRNAPre-anesthesia Checklist: Patient identified, Emergency Drugs available, Suction available, Patient being monitored and Timeout performed Patient Re-evaluated:Patient Re-evaluated prior to induction Oxygen Delivery Method: Simple face mask Preoxygenation: Pre-oxygenation with 100% oxygen Induction Type: IV induction

## 2023-06-13 NOTE — Discharge Instructions (Signed)

## 2023-06-13 NOTE — Transfer of Care (Signed)
Immediate Anesthesia Transfer of Care Note  Patient: Joseph [name]  Procedure(s) Performed: ESOPHAGOGASTRODUODENOSCOPY (EGD) WITH PROPOFOL ENDOSCOPIC MUCOSAL RESECTION SUBMUCOSAL LIFTING INJECTION HEMOSTASIS CLIP PLACEMENT SUBMUCOSAL TATTOO INJECTION  Patient Location: PACU and Endoscopy Unit  Anesthesia Type:General  Level of Consciousness: awake, alert , and oriented  Airway & Oxygen Therapy: Patient Spontanous Breathing  Post-op Assessment: Report given to RN and Post -op Vital signs reviewed and stable  Post vital signs: Reviewed and stable  Last Vitals:  Vitals Value Taken Time  BP    Temp    Pulse 96 06/13/23 1448  Resp 15 06/13/23 1448  SpO2 93 % 06/13/23 1448  Vitals shown include unfiled device data.  Last Pain:  Vitals:   06/13/23 1116  TempSrc: Temporal  PainSc: 0-No pain         Complications: No notable events documented.

## 2023-06-13 NOTE — H&P (Signed)
GASTROENTEROLOGY PROCEDURE H&P NOTE   Primary Care Physician: Erskine Emery, NP  HPI: Joseph [name] is a 82 y.o. 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  Past Medical History:  Diagnosis Date   Abdominal discomfort    Abdominal discomfort: EGD 2011 showed no significant abnormalities.  Possibly due to metformin.   AKI (acute kidney injury) (HCC)    Allergic rhinitis    Anticoagulated 05/30/2016   CAD (coronary artery disease)    a. s/p MI in 2004:  stents to the LAD and staged stent RCA, EF 55%;  b.  NSTEMI (1/16):  LHC - mid LAD stent with diff dist restenosis, prox to mid Dx mod dsz jailed by stent, small OM1 99, mCFX 75, dCFX 80, mRCA stent ok, mPDA mild to mod dsz, EF 55% >> PCI:  Synergy DES to LAD; Synergy DES (x2) mid and dist CFX   CAD in native artery 06/12/2019   Chronic kidney disease, stage 2, mildly decreased GFR    stage 2-3   DDD (degenerative disc disease)    L4 to S1 degenerative disease and spondylolisthesis: low back pain.  s/p back surgery   DM (diabetes mellitus) (HCC)    History of CVA (cerebrovascular accident)    Small CVA seen by head CT (incidental) in 1/13.  Carotid dopplers in 1/13 showed minimal disease   History of Doppler ultrasound    Arterial dopplers (3/11): no evidence for significant PAD. ABIs 10/12: Normal.     HLD (hyperlipidemia)    HTN (hypertension)    had side effects with chlorthalidone   Hx of cardiovascular stress test    Myoview (1/13): Small fixed apical septal defect with no ischemia, EF 58%.   Hx of echocardiogram    Echo (3/11): EF 60%, normal wall motion, mild MR, mild left atrial enlargement, mildly dilated ascending aorta.   PAF (paroxysmal atrial fibrillation) (HCC) 05/29/2016   Panniculitis    SBO (small bowel obstruction) (HCC)    Spondylolisthesis    L4-S1   Statin intolerance    Past Surgical History:  Procedure Laterality Date   BIOPSY  01/18/2023   Procedure: BIOPSY;  Surgeon:  Lemar Lofty., MD;  Location: [facility] ENDOSCOPY;  Service: Gastroenterology;;   COLONOSCOPY N/A 12/26/2017   Procedure: COLONOSCOPY;  Surgeon: Benancio Deeds, MD;  Location: [facility] ENDOSCOPY;  Service: Gastroenterology;  Laterality: N/A;   COLONOSCOPY W/ POLYPECTOMY  12/2017   CORONARY ANGIOGRAM  11/13/2014   Procedure: CORONARY ANGIOGRAM;  Surgeon: Laurey Morale, MD;  Location: [facility] CATH LAB;  Service: Cardiovascular;;   CORONARY STENT INTERVENTION N/A 05/18/2019   Procedure: CORONARY STENT INTERVENTION;  Surgeon: Marykay Lex, MD;  Location: [facility] INVASIVE CV LAB;  Service: Cardiovascular;  Laterality: N/A;   CORONARY STENT PLACEMENT  2004   x2 LAD and RCA    ESOPHAGOGASTRODUODENOSCOPY N/A 01/18/2023   Procedure: ESOPHAGOGASTRODUODENOSCOPY (EGD);  Surgeon: Lemar Lofty., MD;  Location: [facility] ENDOSCOPY;  Service: Gastroenterology;  Laterality: N/A;   LEFT HEART CATH AND CORONARY ANGIOGRAPHY N/A 05/18/2019   Procedure: LEFT HEART CATH AND CORONARY ANGIOGRAPHY;  Surgeon: Marykay Lex, MD;  Location: [facility] INVASIVE CV LAB;  Service: Cardiovascular;  Laterality: N/A;   LEFT HEART CATH AND CORONARY ANGIOGRAPHY N/A 06/11/2019   Procedure: LEFT HEART CATH AND CORONARY ANGIOGRAPHY;  Surgeon: Yvonne Kendall, MD;  Location: [facility] INVASIVE CV LAB;  Service: Cardiovascular;  Laterality: N/A;   LEFT HEART CATH AND CORONARY ANGIOGRAPHY N/A 10/27/2020  Procedure: LEFT HEART CATH AND CORONARY ANGIOGRAPHY;  Surgeon: Lennette Bihari, MD;  Location: [facility] INVASIVE CV LAB;  Service: Cardiovascular;  Laterality: N/A;   LEFT HEART CATHETERIZATION WITH CORONARY ANGIOGRAM N/A 08/02/2014   Procedure: LEFT HEART CATHETERIZATION WITH CORONARY ANGIOGRAM;  Surgeon: Corky Crafts, MD;  Location: [facility] CATH LAB;  Service: Cardiovascular;  Laterality: N/A;   PROSTATE SURGERY     SPINAL FUSION     L4-S1   No current facility-administered medications for this encounter.   No current facility-administered  medications for this encounter. Allergies  Allergen Reactions   Bempedoic Acid Other (See Comments)    NEXLETOL Myalgias, GI upset   [name] [Alirocumab] Other (See Comments)    Muscle weakness and rash   Rosuvastatin Other (See Comments)    Low energy/leg and hip pain   Zetia [Ezetimibe] Other (See Comments)    myalgias   Hydralazine Other (See Comments)    Muscle Aches   Keflex [Cephalexin] Other (See Comments)    Reaction unk   Family History  Problem Relation Age of Onset   Heart attack Mother    Heart attack Father    Stroke Neg Hx    Social History   Socioeconomic History   Marital status: Divorced    Spouse name: Not on file   Number of children: 2   Years of education: Not on file   Highest education level: Not on file  Occupational History   Occupation: retired  Tobacco Use   Smoking status: Never   Smokeless tobacco: Never  Vaping Use   Vaping status: Never Used  Substance and Sexual Activity   Alcohol use: No   Drug use: No   Sexual activity: Not Currently  Other Topics Concern   Not on file  Social History Narrative   Not on file   Social Determinants of Health   Financial Resource Strain: Not on file  Food Insecurity: No Food Insecurity (01/16/2023)   Hunger Vital Sign    Worried About Running Out of Food in the Last Year: Never true    Ran Out of Food in the Last Year: Never true  Transportation Needs: No Transportation Needs (01/16/2023)   PRAPARE - Administrator, Civil Service (Medical): No    Lack of Transportation (Non-Medical): No  Physical Activity: Not on file  Stress: Not on file  Social Connections: Not on file  Intimate Partner Violence: Not At Risk (01/16/2023)   Humiliation, Afraid, Rape, and Kick questionnaire    Fear of Current or Ex-Partner: No    Emotionally Abused: No    Physically Abused: No    Sexually Abused: No    Physical Exam: Today's Vitals   06/06/23 1311 06/13/23 1116  BP:  (!) 170/74  Pulse:   60  Resp:  17  Temp:  97.6 F (36.4 C)  TempSrc:  Temporal  SpO2:  96%  Weight: 96.8 kg 95.3 kg  Height:  6\' 2"  (1.88 m)  PainSc:  0-No pain   Body mass index is 26.96 kg/m. GEN: NAD EYE: Sclerae anicteric ENT: MMM CV: Non-tachycardic GI: Soft, NT/ND NEURO:  Alert & Oriented x 3  Lab Results: No results for input(s): "WBC", "HGB", "HCT", "PLT" in the last 72 hours. BMET No results for input(s): "NA", "K", "CL", "CO2", "GLUCOSE", "BUN", "CREATININE", "CALCIUM" in the last 72 hours. LFT No results for input(s): "PROT", "ALBUMIN", "AST", "ALT", "ALKPHOS", "BILITOT", "BILIDIR", "IBILI" in the last 72 hours. PT/INR No results for input(s): "  LABPROT", "INR" in the last 72 hours.   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  Based upon the description and endoscopic pictures I do feel that it is reasonable to pursue an Advanced Polypectomy attempt of the polyp/lesion.  We discussed some of the techniques of advanced polypectomy which include Endoscopic Mucosal Resection, OVESCO Full-Thickness Resection, Endorotor Morcellation, and Tissue Ablation via Fulguration.  We also reviewed images of typical techniques as noted above.  The risks and benefits of endoscopic evaluation were discussed with the patient; these include but are not limited to the risk of perforation, infection, bleeding, missed lesions, lack of diagnosis, severe illness requiring hospitalization, as well as anesthesia and sedation related illnesses.  During attempts at advanced resection, the risks of bleeding and perforation/leak are increased as opposed to diagnostic and screening procedures, and that was discussed with the patient as well.   In addition, I explained that with the possible need for piecemeal resection, subsequent short-interval endoscopic evaluation for follow up and potential retreatment of the lesion/area may be necessary.  If, after attempt  at removal of the polyp/lesion, it is found that the patient has a complication or that an invasive lesion or malignant lesion is found, or that the polyp/lesion continues to recur, the patient is aware and understands that surgery may still be indicated/required.  All patient questions were answered, to the best of my ability, and the patient agrees to the aforementioned plan of action with follow-up as indicated.   The risks and benefits of endoscopic evaluation/treatment were discussed with the patient and/or family; these include but are not limited to the risk of perforation, infection, bleeding, missed lesions, lack of diagnosis, severe illness requiring hospitalization, as well as anesthesia and sedation related illnesses.  The patient's history has been reviewed, patient examined, no change in status, and deemed stable for procedure.  The patient and/or family is agreeable to proceed.    Corliss Parish, MD [facility] Advanced Endoscopy Office # [phone]

## 2023-06-13 NOTE — Anesthesia Preprocedure Evaluation (Signed)
Anesthesia Evaluation  Patient identified by MRN, date of birth, ID band Patient awake    Reviewed: Allergy & Precautions, NPO status , Patient's Chart, lab work & pertinent test results  Airway Mallampati: II  TM Distance: >3 FB Neck ROM: Full    Dental  (+) Dental Advisory Given   Pulmonary neg pulmonary ROS   breath sounds clear to auscultation       Cardiovascular hypertension, Pt. on medications + CAD, + Cardiac Stents and + Peripheral Vascular Disease  + dysrhythmias Atrial Fibrillation  Rhythm:Regular Rate:Normal     Neuro/Psych negative neurological ROS     GI/Hepatic negative GI ROS, Neg liver ROS,,,  Endo/Other  diabetes    Renal/GU Renal disease     Musculoskeletal   Abdominal   Peds  Hematology   Anesthesia Other Findings   Reproductive/Obstetrics                             Anesthesia Physical Anesthesia Plan  ASA: 3  Anesthesia Plan: MAC   Post-op Pain Management:    Induction:   PONV Risk Score and Plan: 1 and Propofol infusion, Ondansetron and Treatment may vary due to age or medical condition  Airway Management Planned: Natural Airway and Nasal Cannula  Additional Equipment:   Intra-op Plan:   Post-operative Plan:   Informed Consent: I have reviewed the patients History and Physical, chart, labs and discussed the procedure including the risks, benefits and alternatives for the proposed anesthesia with the patient or authorized representative who has indicated his/her understanding and acceptance.       Plan Discussed with:   Anesthesia Plan Comments:        Anesthesia Quick Evaluation

## 2023-06-14 ENCOUNTER — Encounter: Payer: Self-pay | Admitting: Gastroenterology

## 2023-06-14 ENCOUNTER — Encounter: Payer: Medicare Other | Admitting: Gastroenterology

## 2023-06-14 LAB — SURGICAL PATHOLOGY

## 2023-06-14 NOTE — Anesthesia Postprocedure Evaluation (Signed)
Anesthesia Post Note  Patient: YAN RICHARDSON  Procedure(s) Performed: ESOPHAGOGASTRODUODENOSCOPY (EGD) WITH PROPOFOL ENDOSCOPIC MUCOSAL RESECTION SUBMUCOSAL LIFTING INJECTION HEMOSTASIS CLIP PLACEMENT SUBMUCOSAL TATTOO INJECTION     Patient location during evaluation: PACU Anesthesia Type: MAC Level of consciousness: awake and alert Pain management: pain level controlled Vital Signs Assessment: post-procedure vital signs reviewed and stable Respiratory status: spontaneous breathing, nonlabored ventilation, respiratory function stable and patient connected to nasal cannula oxygen Cardiovascular status: stable and blood pressure returned to baseline Postop Assessment: no apparent nausea or vomiting Anesthetic complications: no   No notable events documented.  Last Vitals:  Vitals:   06/13/23 1510 06/13/23 1520  BP: (!) 163/84 (!) 174/81  Pulse: 81 79  Resp: 13 17  Temp:    SpO2: 95% 93%    Last Pain:  Vitals:   06/13/23 1520  TempSrc:   PainSc: 0-No pain                 Kennieth Rad

## 2023-06-15 ENCOUNTER — Encounter (HOSPITAL_COMMUNITY): Payer: Self-pay | Admitting: Gastroenterology

## 2023-06-20 ENCOUNTER — Encounter: Payer: Medicare Other | Admitting: *Deleted

## 2023-06-20 DIAGNOSIS — Z006 Encounter for examination for normal comparison and control in clinical research program: Secondary | ICD-10-CM

## 2023-06-20 MED ORDER — STUDY - ORION 4 - INCLISIRAN 300 MG/1.5 ML OR PLACEBO SQ INJECTION (PI-STUCKEY)
300.0000 mg | INJECTION | SUBCUTANEOUS | Status: DC
Start: 2023-06-20 — End: 2023-12-19
  Administered 2023-06-20: 300 mg via SUBCUTANEOUS
  Filled 2023-06-20: qty 1.5

## 2023-06-20 NOTE — Research (Signed)
error 

## 2023-06-21 NOTE — Research (Signed)
[name]   Patient feeling much better  Had a hospitalization back in June for some GI issues - no more since then - reported to sponsor.. IP given  Next visit scheduled - 12-21-2023 @ 8   Current Outpatient Medications:    acetaminophen (TYLENOL) 650 MG CR tablet, Take 1 tablet (650 mg total) by mouth every 8 (eight) hours as needed for pain., Disp: , Rfl:    amLODipine (NORVASC) 5 MG tablet, Take 5 mg by mouth daily., Disp: , Rfl:    apixaban (ELIQUIS) 2.5 MG TABS tablet, Take 1 tablet (2.5 mg total) by mouth 2 (two) times daily., Disp: , Rfl:    carvedilol (COREG) 6.25 MG tablet, TAKE 1 TABLET BY MOUTH TWICE A DAY (Patient taking differently: Take 6.25 mg by mouth 2 (two) times daily with a meal.), Disp: 180 tablet, Rfl: 1   Cholecalciferol (VITAMIN D3) 25 MCG (1000 UT) CAPS, Take 1,000 Units by mouth daily., Disp: , Rfl:    clopidogrel (PLAVIX) 75 MG tablet, Take 1 tablet (75 mg total) by mouth daily., Disp: , Rfl:    diltiazem (CARDIZEM) 30 MG tablet, Take 1 tablet (30 mg total) by mouth as needed (Rapid Palpitations)., Disp: 30 tablet, Rfl: 5   famotidine (PEPCID) 40 MG tablet, Take 40 mg by mouth at bedtime., Disp: , Rfl:    fish oil-omega-3 fatty acids 1000 MG capsule, Take 1 g by mouth at bedtime., Disp: , Rfl:    glipiZIDE (GLUCOTROL) 10 MG tablet, Take 10 mg by mouth 2 (two) times daily., Disp: , Rfl:    isosorbide mononitrate (IMDUR) 30 MG 24 hr tablet, Take 30 mg by mouth every evening., Disp: , Rfl:    ketoconazole (NIZORAL) 2 % cream, Apply 1 Application topically daily. Apply between toes once daily, Disp: 60 g, Rfl: 2   loperamide (IMODIUM) 2 MG capsule, Take 2 mg by mouth as needed for diarrhea or loose stools., Disp: , Rfl:    nitroGLYCERIN (NITROSTAT) 0.4 MG SL tablet, PLACE 1 TAB UNDER THE TONGUE EVERY 5 MINS AS NEEDED FOR CHEST PAIN FOR UP TO 3 DOSES, Disp: 25 tablet, Rfl: 3   nystatin cream (MYCOSTATIN), Apply topically 2 (two) times daily., Disp: , Rfl:    omeprazole  (PRILOSEC) 40 MG capsule, Take 1 capsule (40 mg total) by mouth 2 (two) times daily before a meal. Take 30 minutes prior to meals, Disp: 60 capsule, Rfl: 6   ondansetron (ZOFRAN) 4 MG tablet, Take 1 tablet (4 mg total) by mouth every 8 (eight) hours as needed for nausea or vomiting., Disp: 30 tablet, Rfl: 1   oxymetazoline (AFRIN) 0.05 % nasal spray, Place 2 sprays into both nostrils at bedtime., Disp: , Rfl:    polyethylene glycol (MIRALAX) 17 g packet, Take 17 g by mouth daily., Disp: 14 each, Rfl: 0   sucralfate (CARAFATE) 1 g tablet, Take 1 tablet (1 g total) by mouth 2 (two) times daily for 14 days., Disp: 28 tablet, Rfl: 0   telmisartan (MICARDIS) 20 MG tablet, Take 1 tablet (20 mg total) by mouth daily., Disp: 30 tablet, Rfl: 0   TRESIBA FLEXTOUCH 100 UNIT/ML FlexTouch Pen, Inject 10 Units into the skin daily. (Patient taking differently: Inject 40-44 Units into the skin daily. Adjust based on blood sugar.), Disp: , Rfl:    triamcinolone cream (KENALOG) 0.1 %, Apply 1 Application topically 2 (two) times daily., Disp: , Rfl:    GALZIN 25 MG CAPS, Take 1 capsule by mouth daily. (Patient  not taking: Reported on 06/20/2023), Disp: , Rfl:    NAFTIN 1 % GEL, APPLY TOPICALLY IN THE MORNING AND AT BEDTIME (Patient not taking: Reported on 06/20/2023), Disp: 60 g, Rfl: 1  Current Facility-Administered Medications:    Study - [name] - inclisiran 300 mg/1.24mL or placebo SQ injection (PI-Stuckey), 300 mg, Subcutaneous, Q6 months, , 300 mg at 06/20/23 1015

## 2023-07-14 DIAGNOSIS — K2282 Esophagogastric junction polyp: Secondary | ICD-10-CM | POA: Insufficient documentation

## 2023-07-28 ENCOUNTER — Ambulatory Visit (INDEPENDENT_AMBULATORY_CARE_PROVIDER_SITE_OTHER): Payer: Medicare Other | Admitting: Podiatry

## 2023-07-28 DIAGNOSIS — M79675 Pain in left toe(s): Secondary | ICD-10-CM

## 2023-07-28 DIAGNOSIS — M79674 Pain in right toe(s): Secondary | ICD-10-CM

## 2023-07-28 DIAGNOSIS — E1151 Type 2 diabetes mellitus with diabetic peripheral angiopathy without gangrene: Secondary | ICD-10-CM

## 2023-07-28 DIAGNOSIS — B351 Tinea unguium: Secondary | ICD-10-CM

## 2023-07-28 NOTE — Progress Notes (Signed)
 Subjective:  Patient ID: Joseph [name], male    DOB: 1941/03/08,  MRN: [id number]   Joseph [name] presents to clinic today for:  Chief Complaint  Patient presents with   Foot Care    Last A1c: 7.2. Takes Eliquis  & Plavix . No corns or callus. Some soreness in RT arch but better now.    Patient notes nails are thick, discolored, elongated and painful in shoegear when trying to ambulate.    PCP is Joseph Angeline FALCON, NP.  Past Medical History:  Diagnosis Date   Abdominal discomfort    Abdominal discomfort: EGD 2011 showed no significant abnormalities.  Possibly due to metformin .   AKI (acute kidney injury) (HCC)    Allergic rhinitis    Anticoagulated 05/30/2016   CAD (coronary artery disease)    a. s/p MI in 2004:  stents to the LAD and staged stent RCA, EF 55%;  b.  NSTEMI (1/16):  LHC - mid LAD stent with diff dist restenosis, prox to mid Dx mod dsz jailed by stent, small OM1 99, mCFX 75, dCFX 80, mRCA stent ok, mPDA mild to mod dsz, EF 55% >> PCI:  Synergy DES to LAD; Synergy DES (x2) mid and dist CFX   CAD in native artery 06/12/2019   Chronic kidney disease, stage 2, mildly decreased GFR    stage 2-3   DDD (degenerative disc disease)    L4 to S1 degenerative disease and spondylolisthesis: low back pain.  s/p back surgery   DM (diabetes mellitus) (HCC)    History of CVA (cerebrovascular accident)    Small CVA seen by head CT (incidental) in 1/13.  Carotid dopplers in 1/13 showed minimal disease   History of Doppler ultrasound    Arterial dopplers (3/11): no evidence for significant PAD. ABIs 10/12: Normal.     HLD (hyperlipidemia)    HTN (hypertension)    had side effects with chlorthalidone   Hx of cardiovascular stress test    Myoview  (1/13): Small fixed apical septal defect with no ischemia, EF 58%.   Hx of echocardiogram    Echo (3/11): EF 60%, normal wall motion, mild MR, mild left atrial enlargement, mildly dilated ascending aorta.   PAF (paroxysmal atrial  fibrillation) (HCC) 05/29/2016   Panniculitis    SBO (small bowel obstruction) (HCC)    Spondylolisthesis    L4-S1   Statin intolerance     Past Surgical History:  Procedure Laterality Date   BIOPSY  01/18/2023   Procedure: BIOPSY;  Surgeon: Joseph [name]., MD;  Location: [facility] ENDOSCOPY;  Service: Gastroenterology;;   COLONOSCOPY N/A 12/26/2017   Procedure: COLONOSCOPY;  Surgeon: Joseph Elspeth SQUIBB, MD;  Location: [facility] ENDOSCOPY;  Service: Gastroenterology;  Laterality: N/A;   COLONOSCOPY W/ POLYPECTOMY  12/2017   CORONARY ANGIOGRAM  11/13/2014   Procedure: CORONARY ANGIOGRAM;  Surgeon: Joseph GORMAN Shuck, MD;  Location: [facility] CATH LAB;  Service: Cardiovascular;;   CORONARY STENT INTERVENTION N/A 05/18/2019   Procedure: CORONARY STENT INTERVENTION;  Surgeon: Joseph Alm LELON, MD;  Location: [facility] INVASIVE CV LAB;  Service: Cardiovascular;  Laterality: N/A;   CORONARY STENT PLACEMENT  2004   x2 LAD and RCA    ENDOSCOPIC MUCOSAL RESECTION  06/13/2023   Procedure: ENDOSCOPIC MUCOSAL RESECTION;  Surgeon: Joseph [name]., MD;  Location: [facility] ENDOSCOPY;  Service: Gastroenterology;;   ESOPHAGOGASTRODUODENOSCOPY N/A 01/18/2023   Procedure: ESOPHAGOGASTRODUODENOSCOPY (EGD);  Surgeon: Joseph [name]., MD;  Location: [facility] ENDOSCOPY;  Service: Gastroenterology;  Laterality: N/A;  ESOPHAGOGASTRODUODENOSCOPY (EGD) WITH PROPOFOL  N/A 06/13/2023   Procedure: ESOPHAGOGASTRODUODENOSCOPY (EGD) WITH PROPOFOL ;  Surgeon: Joseph [name]., MD;  Location: [facility] ENDOSCOPY;  Service: Gastroenterology;  Laterality: N/A;   HEMOSTASIS CLIP PLACEMENT  06/13/2023   Procedure: HEMOSTASIS CLIP PLACEMENT;  Surgeon: Joseph [name]., MD;  Location: [facility] ENDOSCOPY;  Service: Gastroenterology;;   LEFT HEART CATH AND CORONARY ANGIOGRAPHY N/A 05/18/2019   Procedure: LEFT HEART CATH AND CORONARY ANGIOGRAPHY;  Surgeon: Joseph Alm ORN, MD;  Location: [facility] INVASIVE CV LAB;  Service: Cardiovascular;  Laterality: N/A;    LEFT HEART CATH AND CORONARY ANGIOGRAPHY N/A 06/11/2019   Procedure: LEFT HEART CATH AND CORONARY ANGIOGRAPHY;  Surgeon: Joseph Bruckner, MD;  Location: [facility] INVASIVE CV LAB;  Service: Cardiovascular;  Laterality: N/A;   LEFT HEART CATH AND CORONARY ANGIOGRAPHY N/A 10/27/2020   Procedure: LEFT HEART CATH AND CORONARY ANGIOGRAPHY;  Surgeon: Joseph Debby LABOR, MD;  Location: [facility] INVASIVE CV LAB;  Service: Cardiovascular;  Laterality: N/A;   LEFT HEART CATHETERIZATION WITH CORONARY ANGIOGRAM N/A 08/02/2014   Procedure: LEFT HEART CATHETERIZATION WITH CORONARY ANGIOGRAM;  Surgeon: Joseph GORMAN Reek, MD;  Location: [facility] CATH LAB;  Service: Cardiovascular;  Laterality: N/A;   PROSTATE SURGERY     SPINAL FUSION     L4-S1   SUBMUCOSAL LIFTING INJECTION  06/13/2023   Procedure: SUBMUCOSAL LIFTING INJECTION;  Surgeon: Joseph [name]., MD;  Location: [facility] ENDOSCOPY;  Service: Gastroenterology;;   SUBMUCOSAL TATTOO INJECTION  06/13/2023   Procedure: SUBMUCOSAL TATTOO INJECTION;  Surgeon: Joseph [name]., MD;  Location: [facility] ENDOSCOPY;  Service: Gastroenterology;;    Allergies  Allergen Reactions   Bempedoic Acid  Other (See Comments)    NEXLETOL  Myalgias, GI upset   [name]  [Alirocumab ] Other (See Comments)    Muscle weakness and rash   Rosuvastatin  Other (See Comments)    Low energy/leg and hip pain   Zetia  [Ezetimibe ] Other (See Comments)    myalgias   Hydralazine  Other (See Comments)    Muscle Aches   Keflex [Cephalexin] Other (See Comments)    Reaction unk    Review of Systems: Negative except as noted in the HPI.  Objective:  There were no vitals filed for this visit.  Joseph [name] is a pleasant 83 y.o. male in NAD. AAO x 3.  Vascular Examination: Capillary refill time is 3-5 seconds to toes bilateral. Trace palpable pedal pulses b/l LE. Digital hair sparse b/l.  Skin temperature gradient WNL b/l. No varicosities b/l. No cyanosis noted b/l.   Dermatological  Examination: Pedal skin with normal turgor, texture and tone b/l. No open wounds. No interdigital macerations b/l. Toenails x10 are 3mm thick, discolored, dystrophic with subungual debris. There is pain with compression of the nail plates.  They are elongated x10  Assessment/Plan: 1. Pain due to onychomycosis of toenails of both feet   2. Type II diabetes mellitus with peripheral circulatory disorder (HCC)     The mycotic toenails were sharply debrided x10 with sterile nail nippers and a power debriding burr to decrease bulk/thickness and length.    Return in about 3 months (around 10/26/2023) for [facility].   Awanda CHARM Imperial, DPM, FACFAS [facility]     [address], [place] [address]  Office [phone]  Fax [phone]

## 2023-10-27 ENCOUNTER — Ambulatory Visit: Payer: Medicare Other | Admitting: Podiatry

## 2023-10-27 DIAGNOSIS — M79675 Pain in left toe(s): Secondary | ICD-10-CM | POA: Diagnosis not present

## 2023-10-27 DIAGNOSIS — B351 Tinea unguium: Secondary | ICD-10-CM | POA: Diagnosis not present

## 2023-10-27 DIAGNOSIS — M79674 Pain in right toe(s): Secondary | ICD-10-CM

## 2023-10-27 DIAGNOSIS — B353 Tinea pedis: Secondary | ICD-10-CM

## 2023-10-27 MED ORDER — CICLOPIROX OLAMINE 0.77 % EX CREA: TOPICAL_CREAM | CUTANEOUS | 1 refills | Status: DC

## 2023-10-27 NOTE — Progress Notes (Signed)
 Subjective:  Patient ID: Joseph [name], male    DOB: [birth year],  MRN: [id number]   Joseph [name] presents to clinic today for:  Chief Complaint  Patient presents with   [facility]    [facility] with out callous. Last A1c 7.7 testerday. Takes plavix and elliquis   Patient notes nails are thick, discolored, elongated and painful in shoegear when trying to ambulate.    PCP is Marce Sensing, NP.  Past Medical History:  Diagnosis Date   Abdominal discomfort    Abdominal discomfort: EGD 2011 showed no significant abnormalities.  Possibly due to metformin.   AKI (acute kidney injury) (HCC)    Allergic rhinitis    Anticoagulated 05/30/2016   CAD (coronary artery disease)    a. s/p MI in 2004:  stents to the LAD and staged stent RCA, EF 55%;  b.  NSTEMI (1/16):  LHC - mid LAD stent with diff dist restenosis, prox to mid Dx mod dsz jailed by stent, small OM1 99, mCFX 75, dCFX 80, mRCA stent ok, mPDA mild to mod dsz, EF 55% >> PCI:  Synergy DES to LAD; Synergy DES (x2) mid and dist CFX   CAD in native artery 06/12/2019   Chronic kidney disease, stage 2, mildly decreased GFR    stage 2-3   DDD (degenerative disc disease)    L4 to S1 degenerative disease and spondylolisthesis: low back pain.  s/p back surgery   DM (diabetes mellitus) (HCC)    History of CVA (cerebrovascular accident)    Small CVA seen by head CT (incidental) in 1/13.  Carotid dopplers in 1/13 showed minimal disease   History of Doppler ultrasound    Arterial dopplers (3/11): no evidence for significant PAD. ABIs 10/12: Normal.     HLD (hyperlipidemia)    HTN (hypertension)    had side effects with chlorthalidone   Hx of cardiovascular stress test    Myoview (1/13): Small fixed apical septal defect with no ischemia, EF 58%.   Hx of echocardiogram    Echo (3/11): EF 60%, normal wall motion, mild MR, mild left atrial enlargement, mildly dilated ascending aorta.   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   Statin intolerance     Past Surgical History:  Procedure Laterality Date   BIOPSY  01/18/2023   Procedure: BIOPSY;  Surgeon: Normie Becton., MD;  Location: [facility] ENDOSCOPY;  Service: Gastroenterology;;   COLONOSCOPY N/A 12/26/2017   Procedure: COLONOSCOPY;  Surgeon: Ace Holder, MD;  Location: [facility] ENDOSCOPY;  Service: Gastroenterology;  Laterality: N/A;   COLONOSCOPY W/ POLYPECTOMY  12/2017   CORONARY ANGIOGRAM  11/13/2014   Procedure: CORONARY ANGIOGRAM;  Surgeon: Darlis Eisenmenger, MD;  Location: [facility] CATH LAB;  Service: Cardiovascular;;   CORONARY STENT INTERVENTION N/A 05/18/2019   Procedure: CORONARY STENT INTERVENTION;  Surgeon: Arleen Lacer, MD;  Location: [facility] INVASIVE CV LAB;  Service: Cardiovascular;  Laterality: N/A;   CORONARY STENT PLACEMENT  2004   x2 LAD and RCA    ENDOSCOPIC MUCOSAL RESECTION  06/13/2023   Procedure: ENDOSCOPIC MUCOSAL RESECTION;  Surgeon: Normie Becton., MD;  Location: [facility] ENDOSCOPY;  Service: Gastroenterology;;   ESOPHAGOGASTRODUODENOSCOPY N/A 01/18/2023   Procedure: ESOPHAGOGASTRODUODENOSCOPY (EGD);  Surgeon: Normie Becton., MD;  Location: [facility] ENDOSCOPY;  Service: Gastroenterology;  Laterality: N/A;   ESOPHAGOGASTRODUODENOSCOPY (EGD) WITH PROPOFOL N/A 06/13/2023  Procedure: ESOPHAGOGASTRODUODENOSCOPY (EGD) WITH PROPOFOL;  Surgeon: Brice Campi Albino Alu., MD;  Location: [facility] ENDOSCOPY;  Service: Gastroenterology;  Laterality: N/A;   HEMOSTASIS CLIP PLACEMENT  06/13/2023   Procedure: HEMOSTASIS CLIP PLACEMENT;  Surgeon: Normie Becton., MD;  Location: [facility] ENDOSCOPY;  Service: Gastroenterology;;   LEFT HEART CATH AND CORONARY ANGIOGRAPHY N/A 05/18/2019   Procedure: LEFT HEART CATH AND CORONARY ANGIOGRAPHY;  Surgeon: Arleen Lacer, MD;  Location: [facility] INVASIVE CV LAB;  Service: Cardiovascular;  Laterality: N/A;   LEFT HEART CATH AND CORONARY  ANGIOGRAPHY N/A 06/11/2019   Procedure: LEFT HEART CATH AND CORONARY ANGIOGRAPHY;  Surgeon: Sammy Crisp, MD;  Location: [facility] INVASIVE CV LAB;  Service: Cardiovascular;  Laterality: N/A;   LEFT HEART CATH AND CORONARY ANGIOGRAPHY N/A 10/27/2020   Procedure: LEFT HEART CATH AND CORONARY ANGIOGRAPHY;  Surgeon: Millicent Ally, MD;  Location: [facility] INVASIVE CV LAB;  Service: Cardiovascular;  Laterality: N/A;   LEFT HEART CATHETERIZATION WITH CORONARY ANGIOGRAM N/A 08/02/2014   Procedure: LEFT HEART CATHETERIZATION WITH CORONARY ANGIOGRAM;  Surgeon: Lucendia Rusk, MD;  Location: [facility] CATH LAB;  Service: Cardiovascular;  Laterality: N/A;   PROSTATE SURGERY     SPINAL FUSION     L4-S1   SUBMUCOSAL LIFTING INJECTION  06/13/2023   Procedure: SUBMUCOSAL LIFTING INJECTION;  Surgeon: Brice Campi Albino Alu., MD;  Location: [facility] ENDOSCOPY;  Service: Gastroenterology;;   SUBMUCOSAL TATTOO INJECTION  06/13/2023   Procedure: SUBMUCOSAL TATTOO INJECTION;  Surgeon: Normie Becton., MD;  Location: [facility] ENDOSCOPY;  Service: Gastroenterology;;    Allergies  Allergen Reactions   Bempedoic Acid Other (See Comments)    NEXLETOL Myalgias, GI upset   [name] [Alirocumab] Other (See Comments)    Muscle weakness and rash   Rosuvastatin Other (See Comments)    Low energy/leg and hip pain   Zetia [Ezetimibe] Other (See Comments)    myalgias   Hydralazine Other (See Comments)    Muscle Aches   Keflex [Cephalexin] Other (See Comments)    Reaction unk    Review of Systems: Negative except as noted in the HPI.  Objective:  Joseph [name] is a pleasant 83 y.o. male in NAD. AAO x 3.  Vascular Examination: Capillary refill time is 3-5 seconds to toes bilateral.  Trace palpable pedal pulses b/l LE. Digital hair sparse b/l.  Skin temperature gradient WNL b/l.   Dermatological Examination: Pedal skin with normal turgor, texture and tone b/l. No open wounds. No interdigital macerations b/l. Toenails x10 are  3mm thick, discolored, dystrophic with subungual debris. There is pain with compression of the nail plates.  They are elongated x10.  There is peeling erythema and maceration in the right fourth interspace with mild peeling and maceration in the left fourth interspace consistent with interdigital tinea pedis  Assessment/Plan: 1. Pain due to onychomycosis of toenails of both feet   2. Tinea pedis of both feet     Meds ordered this encounter  Medications   ciclopirox (LOPROX) 0.77 % cream    Sig: Apply between toes twice daily for 4 weeks.    Dispense:  30 g    Refill:  1   The mycotic toenails were sharply debrided x10 with sterile nail nippers and a power debriding burr to decrease bulk/thickness and length.    The patient still has significant interdigital tinea pedis in the fourth interspace with the right being worse than the left.  Ketoconazole did not resolve the issue.  Prescription nystatin did not seem to be covered by  his insurance.  Will send in prescription ciclopirox cream to see if this is covered by his insurance and he will apply this once to twice daily between the affected toes For at least 4 weeks  Return in about 3 months (around 01/26/2024) for [facility].   Joe Murders, DPM, FACFAS [facility]     [address] [place], [place] [address]                Office [phone]  Fax [phone]

## 2023-11-24 DIAGNOSIS — K219 Gastro-esophageal reflux disease without esophagitis: Secondary | ICD-10-CM | POA: Insufficient documentation

## 2023-11-24 DIAGNOSIS — Z8546 Personal history of malignant neoplasm of prostate: Secondary | ICD-10-CM | POA: Insufficient documentation

## 2023-11-27 DIAGNOSIS — J302 Other seasonal allergic rhinitis: Secondary | ICD-10-CM | POA: Insufficient documentation

## 2023-11-28 DIAGNOSIS — J479 Bronchiectasis, uncomplicated: Secondary | ICD-10-CM | POA: Insufficient documentation

## 2023-11-30 DIAGNOSIS — J069 Acute upper respiratory infection, unspecified: Secondary | ICD-10-CM | POA: Insufficient documentation

## 2023-12-01 DIAGNOSIS — R0600 Dyspnea, unspecified: Secondary | ICD-10-CM | POA: Insufficient documentation

## 2023-12-03 ENCOUNTER — Encounter (HOSPITAL_COMMUNITY): Payer: Self-pay | Admitting: Emergency Medicine

## 2023-12-03 ENCOUNTER — Emergency Department (HOSPITAL_COMMUNITY)

## 2023-12-03 ENCOUNTER — Emergency Department (HOSPITAL_COMMUNITY)
Admission: EM | Admit: 2023-12-03 | Discharge: 2023-12-03 | Disposition: A | Discharge status: Home / Self Care | Attending: Emergency Medicine | Admitting: Emergency Medicine

## 2023-12-03 ENCOUNTER — Other Ambulatory Visit: Payer: Self-pay

## 2023-12-03 DIAGNOSIS — R079 Chest pain, unspecified: Secondary | ICD-10-CM | POA: Diagnosis not present

## 2023-12-03 DIAGNOSIS — R052 Subacute cough: Secondary | ICD-10-CM | POA: Diagnosis not present

## 2023-12-03 DIAGNOSIS — D72829 Elevated white blood cell count, unspecified: Secondary | ICD-10-CM | POA: Insufficient documentation

## 2023-12-03 DIAGNOSIS — Z7901 Long term (current) use of anticoagulants: Secondary | ICD-10-CM | POA: Diagnosis not present

## 2023-12-03 DIAGNOSIS — R059 Cough, unspecified: Secondary | ICD-10-CM | POA: Diagnosis present

## 2023-12-03 LAB — BASIC METABOLIC PANEL WITH GFR
Anion gap: 10 (ref 5–15)
BUN: 26 mg/dL — ABNORMAL HIGH (ref 8–23)
CO2: 21 mmol/L — ABNORMAL LOW (ref 22–32)
Calcium: 9 mg/dL (ref 8.9–10.3)
Chloride: 102 mmol/L (ref 98–111)
Creatinine, Ser: 1.2 mg/dL (ref 0.61–1.24)
GFR, Estimated: 59 mL/min — ABNORMAL LOW (ref >60)
Glucose, Bld: 228 mg/dL — ABNORMAL HIGH (ref 70–99)
Potassium: 4.5 mmol/L (ref 3.5–5.1)
Sodium: 133 mmol/L — ABNORMAL LOW (ref 135–145)

## 2023-12-03 LAB — TROPONIN I (HIGH SENSITIVITY)
Troponin I (High Sensitivity): 16 ng/L (ref <18)
Troponin I (High Sensitivity): 17 ng/L (ref <18)

## 2023-12-03 LAB — CBC
HCT: 32.6 % — ABNORMAL LOW (ref 39.0–52.0)
Hemoglobin: 11.5 g/dL — ABNORMAL LOW (ref 13.0–17.0)
MCH: 31.8 pg (ref 26.0–34.0)
MCHC: 35.3 g/dL (ref 30.0–36.0)
MCV: 90.1 fL (ref 80.0–100.0)
Platelets: 305 10*3/uL (ref 150–400)
RBC: 3.62 MIL/uL — ABNORMAL LOW (ref 4.22–5.81)
RDW: 12 % (ref 11.5–15.5)
WBC: 13.5 10*3/uL — ABNORMAL HIGH (ref 4.0–10.5)
nRBC: 0 % (ref 0.0–0.2)

## 2023-12-03 LAB — BRAIN NATRIURETIC PEPTIDE: B Natriuretic Peptide: 57.8 pg/mL (ref 0.0–100.0)

## 2023-12-03 MED ORDER — BENZONATATE 100 MG PO CAPS: 100.0000 mg | ORAL_CAPSULE | Freq: Three times a day (TID) | ORAL | 0 refills | Status: DC

## 2023-12-03 NOTE — ED Triage Notes (Signed)
 Pt BIB [facility] from home due to chest pain and cough for the past two weeks.  Pt went to urgent care but they found nothing.  Pt does have follow up appointment in June.  VS BP 134/76

## 2023-12-03 NOTE — ED Notes (Signed)
 Got patient into a gown on the monitor patient is resting with call bell in reach

## 2023-12-03 NOTE — Discharge Instructions (Addendum)
 You have been seen and discharged from the emergency department.  Your heart workup was normal.  Your blood work showed slightly elevated white blood cells.  Chest x-ray continues to show scarring versus improving infection in the lower lungs. Continue taking the Levaquin as directed.  You have also been prescribed Tessalon Perles to help with decreasing cough for relief.  It is important to follow-up with pulmonology for more in-depth evaluation.  Follow-up with your primary provider for further evaluation and further care. Take home medications as prescribed. If you have any worsening symptoms or further concerns for your health please return to an emergency department for further evaluation.

## 2023-12-03 NOTE — ED Provider Notes (Signed)
 [place] EMERGENCY DEPARTMENT AT [facility] Provider Note   CSN: [id number] Arrival date & time: 12/03/23  0848     History  Chief Complaint  Patient presents with   Chest Pain    JARETH PARDEE is a 83 y.o. male.  HPI   83 year old male presents emergency department with concern for ongoing cough for the past month that is now productive for the last 2 weeks.  Patient has been to urgent care.  He states he has completed a prednisone taper without improvement and is currently on day 3 of Levaquin without improvement.  Denies any ongoing chest pain outside of rib pain with coughing.  No hemoptysis.  He is anticoagulated on Eliquis  and compliant.  Denies any fever or chills.  No lower leg swelling.  Otherwise compliant with medications.  Home Medications Prior to Admission medications   Medication Sig Start Date End Date Taking? Authorizing Provider  acetaminophen  (TYLENOL ) 650 MG CR tablet Take 1 tablet (650 mg total) by mouth every 8 (eight) hours as needed for pain. 01/06/23   Kraig Peru, MD  amLODipine  (NORVASC ) 5 MG tablet Take 5 mg by mouth daily. 04/30/23   [provider]  apixaban  (ELIQUIS ) 2.5 MG TABS tablet Take 1 tablet (2.5 mg total) by mouth 2 (two) times daily. 06/15/23   Mansouraty, Albino Alu., MD  carvedilol  (COREG ) 6.25 MG tablet TAKE 1 TABLET BY MOUTH TWICE A DAY Patient taking differently: Take 6.25 mg by mouth 2 (two) times daily with a meal. 06/29/22   Nahser, Lela Purple, MD  Cholecalciferol  (VITAMIN D3) 25 MCG (1000 UT) CAPS Take 1,000 Units by mouth daily.    [provider]  ciclopirox  (LOPROX ) 0.77 % cream Apply between toes twice daily for 4 weeks. 10/27/23   McCaughan, Dia D, DPM  clopidogrel  (PLAVIX ) 75 MG tablet Take 1 tablet (75 mg total) by mouth daily. 06/16/23   Mansouraty, Albino Alu., MD  diltiazem  (CARDIZEM ) 30 MG tablet Take 1 tablet (30 mg total) by mouth as needed (Rapid Palpitations). 01/31/18   Marlyse Single T,  PA-C  famotidine  (PEPCID ) 40 MG tablet Take 40 mg by mouth at bedtime. 02/09/23   [provider]  fish oil -omega-3 fatty acids  1000 MG capsule Take 1 g by mouth at bedtime.    [provider]  GALZIN 25 MG CAPS Take 1 capsule by mouth daily. 01/24/23   [provider]  glipiZIDE  (GLUCOTROL ) 10 MG tablet Take 10 mg by mouth 2 (two) times daily. 01/24/23   [provider]  isosorbide  mononitrate (IMDUR ) 30 MG 24 hr tablet Take 30 mg by mouth every evening.    [provider]  loperamide (IMODIUM) 2 MG capsule Take 2 mg by mouth as needed for diarrhea or loose stools.    [provider]  nitroGLYCERIN  (NITROSTAT ) 0.4 MG SL tablet PLACE 1 TAB UNDER THE TONGUE EVERY 5 MINS AS NEEDED FOR CHEST PAIN FOR UP TO 3 DOSES 05/07/22   Nahser, Lela Purple, MD  omeprazole  (PRILOSEC) 40 MG capsule Take 1 capsule (40 mg total) by mouth 2 (two) times daily before a meal. Take 30 minutes prior to meals 06/13/23   Mansouraty, Albino Alu., MD  ondansetron  (ZOFRAN ) 4 MG tablet Take 1 tablet (4 mg total) by mouth every 8 (eight) hours as needed for nausea or vomiting. 01/06/23 01/06/24  Patel, Pranav M, MD  oxymetazoline (AFRIN) 0.05 % nasal spray Place 2 sprays into both nostrils at bedtime.  [provider]  polyethylene glycol (MIRALAX ) 17 g packet Take 17 g by mouth daily. 01/06/23   Kraig Peru, MD  sucralfate  (CARAFATE ) 1 g tablet Take 1 tablet (1 g total) by mouth 2 (two) times daily for 14 days. 06/13/23 06/27/23  Mansouraty, Albino Alu., MD  telmisartan  (MICARDIS ) 20 MG tablet Take 1 tablet (20 mg total) by mouth daily. 01/06/23   Kraig Peru, MD  TRESIBA  FLEXTOUCH 100 UNIT/ML FlexTouch Pen Inject 10 Units into the skin daily. Patient taking differently: Inject 40-44 Units into the skin daily. Adjust based on blood sugar. 01/06/23   Patel, Pranav M, MD  triamcinolone cream (KENALOG) 0.1 % Apply 1 Application topically 2 (two) times daily. 03/28/23    [provider]      Allergies    Bempedoic acid , [name]  [alirocumab ], Rosuvastatin , Zetia  [ezetimibe ], Hydralazine , and Keflex [cephalexin]    Review of Systems   Review of Systems  Constitutional:  Positive for fatigue. Negative for fever.  Respiratory:  Positive for cough and shortness of breath. Negative for chest tightness.   Cardiovascular:  Negative for chest pain and leg swelling.  Gastrointestinal:  Negative for abdominal pain, diarrhea and vomiting.  Skin:  Negative for rash.  Neurological:  Negative for headaches.    Physical Exam Updated Vital Signs BP 134/71   Pulse 61   Temp 98.3 F (36.8 C) (Oral)   Resp (!) 22   Ht 6\' 2"  (1.88 m)   Wt 95.3 kg   SpO2 95%   BMI 26.96 kg/m  Physical Exam Vitals and nursing note reviewed.  Constitutional:      General: He is not in acute distress.    Appearance: Normal appearance. He is not ill-appearing.  HENT:     Head: Normocephalic.     Mouth/Throat:     Mouth: Mucous membranes are moist.  Cardiovascular:     Rate and Rhythm: Normal rate.  Pulmonary:     Effort: Pulmonary effort is normal. No respiratory distress.     Breath sounds: Examination of the right-lower field reveals decreased breath sounds. Examination of the left-lower field reveals decreased breath sounds. Decreased breath sounds present.  Abdominal:     Palpations: Abdomen is soft.     Tenderness: There is no abdominal tenderness.  Musculoskeletal:     Right lower leg: No edema.     Left lower leg: No edema.  Skin:    General: Skin is warm.  Neurological:     Mental Status: He is alert and oriented to person, place, and time. Mental status is at baseline.  Psychiatric:        Mood and Affect: Mood normal.     ED Results / Procedures / Treatments   Labs (all labs ordered are listed, but only abnormal results are displayed) Labs Reviewed  BASIC METABOLIC PANEL WITH GFR  CBC  BRAIN NATRIURETIC PEPTIDE  TROPONIN I (HIGH SENSITIVITY)     EKG EKG Interpretation Date/Time:  Saturday Dec 03 2023 08:49:59 EDT Ventricular Rate:  68 PR Interval:  256 QRS Duration:  107 QT Interval:  402 QTC Calculation: 428 R Axis:   -44  Text Interpretation: Sinus rhythm Prolonged PR interval Left axis deviation Anterior infarct, old Similar to previous Confirmed by Florentino Hurdle [phone]) on 12/03/2023 10:09:06 AM  Radiology DG Chest 2 View Result Date: 12/03/2023 CLINICAL DATA:  Chest pain, cough. EXAM: CHEST - 2 VIEW COMPARISON:  October 26, 2023. FINDINGS: The heart size and mediastinal contours are within  normal limits. Minimal bibasilar subsegmental atelectasis is noted. The visualized skeletal structures are unremarkable. IMPRESSION: Minimal bibasilar subsegmental atelectasis. Electronically Signed   By: Rosalene Colon M.D.   On: 12/03/2023 10:19    Procedures Procedures    Medications Ordered in ED Medications - No data to display  ED Course/ Medical Decision Making/ A&P                                 Medical Decision Making Amount and/or Complexity of Data Reviewed Labs: ordered. Radiology: ordered.  Risk Prescription drug management.   83 year old male presents emergency department ongoing cough.  Currently on Levaquin for presumed respiratory infection, completed a prednisone taper.  Here with ongoing productive cough of green phlegm.  Vitals are stable on arrival, he is at times tachypneic but no hypoxia.  Blood pressure stable, afebrile.  Blood work shows a mild leukocytosis, possibly from prednisone taper.  Otherwise cardiac workup is negative.  Chest x-ray shows possible atelectasis versus infiltrate in the lower lungs.  He is on day 3 of Levaquin and feeling somewhat improved, main complaint is worsening cough.  Discussed with patient continuing his Levaquin course and I will add Tessalon Perles for cough control.  Otherwise he has a scheduled follow-up with pulmonology for further evaluation and treatment.  I  doubt PE given that he is anticoagulant on Eliquis , compliance, no tachycardia or hypoxia.  Patient at this time appears safe and stable for discharge and close outpatient follow up. Discharge plan and strict return to ED precautions discussed, patient verbalizes understanding and agreement.        Final Clinical Impression(s) / ED Diagnoses Final diagnoses:  None    Rx / DC Orders ED Discharge Orders     None         Flonnie Humphrey, DO 12/03/23 1451

## 2023-12-19 ENCOUNTER — Encounter: Admitting: *Deleted

## 2023-12-19 DIAGNOSIS — Z006 Encounter for examination for normal comparison and control in clinical research program: Secondary | ICD-10-CM

## 2023-12-19 MED ORDER — STUDY - ORION 4 - INCLISIRAN 300 MG/1.5 ML OR PLACEBO SQ INJECTION (PI-STUCKEY)
300.0000 mg | INJECTION | SUBCUTANEOUS | Status: AC
  Administered 2023-12-19: 300 mg via SUBCUTANEOUS
  Filled 2023-12-19: qty 1.5

## 2023-12-20 NOTE — Research (Signed)
 [name]  Patient doing well, no complaints of cp or sob. IP given  Will follow up in Dec 2025 with fasting labs

## 2023-12-27 ENCOUNTER — Other Ambulatory Visit: Payer: Self-pay | Admitting: Thoracic Surgery (Cardiothoracic Vascular Surgery)

## 2023-12-27 ENCOUNTER — Ambulatory Visit
Attending: Thoracic Surgery (Cardiothoracic Vascular Surgery) | Admitting: Thoracic Surgery (Cardiothoracic Vascular Surgery)

## 2023-12-27 ENCOUNTER — Encounter: Payer: Self-pay | Admitting: Thoracic Surgery (Cardiothoracic Vascular Surgery)

## 2023-12-27 VITALS — BP 144/68 | HR 70 | Resp 20 | Ht 74.0 in | Wt 217.1 lb

## 2023-12-27 DIAGNOSIS — R918 Other nonspecific abnormal finding of lung field: Secondary | ICD-10-CM | POA: Diagnosis present

## 2023-12-27 NOTE — Progress Notes (Signed)
 PCP is Marce Sensing, NP Referring Provider is Marce Sensing, NP  Chief Complaint  Patient presents with   Lung Lesion    New patient consult, Chest CT 5/12    HPI: Mr. Chanthavong is sent for consultation regarding right lower lobe opacity seen on CT and PET.  Johnross Nabozny is an 83 year old man with a past medical history significant for coronary artery disease, MI, stents, stroke, paroxysmal atrial fibrillation, chronic anticoagulation, stage II chronic kidney disease, insulin -dependent type 2 diabetes, reflux, small bowel obstruction, and recent pneumonia.  He is a lifelong non-smoker.  He complains of a 1 month history of a productive cough and general malaise.  Describes his sputum as being dark green.  Was treated with multiple antibiotics.  Completed those about 2 weeks ago.  Cough has improved but it still occurs and is productive.  Also complains of general malaise and lack of energy, which predates the respiratory issues but has been worse since.  Does have a lot of burning chest pain which she attributes to reflux and is controlled with Tums.  He stopped taking Prilosec because of side effects.   Past Medical History:  Diagnosis Date   Abdominal discomfort    Abdominal discomfort: EGD 2011 showed no significant abnormalities.  Possibly due to metformin .   AKI (acute kidney injury) (HCC)    Allergic rhinitis    Anticoagulated 05/30/2016   CAD (coronary artery disease)    a. s/p MI in 2004:  stents to the LAD and staged stent RCA, EF 55%;  b.  NSTEMI (1/16):  LHC - mid LAD stent with diff dist restenosis, prox to mid Dx mod dsz jailed by stent, small OM1 99, mCFX 75, dCFX 80, mRCA stent ok, mPDA mild to mod dsz, EF 55% >> PCI:  Synergy DES to LAD; Synergy DES (x2) mid and dist CFX   CAD in native artery 06/12/2019   Chronic kidney disease, stage 2, mildly decreased GFR    stage 2-3   DDD (degenerative disc disease)    L4 to S1 degenerative disease and spondylolisthesis: low back  pain.  s/p back surgery   DM (diabetes mellitus) (HCC)    History of CVA (cerebrovascular accident)    Small CVA seen by head CT (incidental) in 1/13.  Carotid dopplers in 1/13 showed minimal disease   History of Doppler ultrasound    Arterial dopplers (3/11): no evidence for significant PAD. ABIs 10/12: Normal.     HLD (hyperlipidemia)    HTN (hypertension)    had side effects with chlorthalidone   Hx of cardiovascular stress test    Myoview  (1/13): Small fixed apical septal defect with no ischemia, EF 58%.   Hx of echocardiogram    Echo (3/11): EF 60%, normal wall motion, mild MR, mild left atrial enlargement, mildly dilated ascending aorta.   PAF (paroxysmal atrial fibrillation) (HCC) 05/29/2016   Panniculitis    SBO (small bowel obstruction) (HCC)    Spondylolisthesis    L4-S1   Statin intolerance     Past Surgical History:  Procedure Laterality Date   BIOPSY  01/18/2023   Procedure: BIOPSY;  Surgeon: Normie Becton., MD;  Location: [facility] ENDOSCOPY;  Service: Gastroenterology;;   COLONOSCOPY N/A 12/26/2017   Procedure: COLONOSCOPY;  Surgeon: Ace Holder, MD;  Location: [facility] ENDOSCOPY;  Service: Gastroenterology;  Laterality: N/A;   COLONOSCOPY W/ POLYPECTOMY  12/2017   CORONARY ANGIOGRAM  11/13/2014   Procedure: CORONARY ANGIOGRAM;  Surgeon: Darlis Eisenmenger, MD;  Location: [facility] CATH LAB;  Service: Cardiovascular;;   CORONARY STENT INTERVENTION N/A 05/18/2019   Procedure: CORONARY STENT INTERVENTION;  Surgeon: Arleen Lacer, MD;  Location: [facility] INVASIVE CV LAB;  Service: Cardiovascular;  Laterality: N/A;   CORONARY STENT PLACEMENT  2004   x2 LAD and RCA    ENDOSCOPIC MUCOSAL RESECTION  06/13/2023   Procedure: ENDOSCOPIC MUCOSAL RESECTION;  Surgeon: Normie Becton., MD;  Location: [facility] ENDOSCOPY;  Service: Gastroenterology;;   ESOPHAGOGASTRODUODENOSCOPY N/A 01/18/2023   Procedure: ESOPHAGOGASTRODUODENOSCOPY (EGD);  Surgeon: Normie Becton., MD;  Location: [facility]  ENDOSCOPY;  Service: Gastroenterology;  Laterality: N/A;   ESOPHAGOGASTRODUODENOSCOPY (EGD) WITH PROPOFOL  N/A 06/13/2023   Procedure: ESOPHAGOGASTRODUODENOSCOPY (EGD) WITH PROPOFOL ;  Surgeon: Brice Campi Albino Alu., MD;  Location: [facility] ENDOSCOPY;  Service: Gastroenterology;  Laterality: N/A;   HEMOSTASIS CLIP PLACEMENT  06/13/2023   Procedure: HEMOSTASIS CLIP PLACEMENT;  Surgeon: Normie Becton., MD;  Location: [facility] ENDOSCOPY;  Service: Gastroenterology;;   LEFT HEART CATH AND CORONARY ANGIOGRAPHY N/A 05/18/2019   Procedure: LEFT HEART CATH AND CORONARY ANGIOGRAPHY;  Surgeon: Arleen Lacer, MD;  Location: [facility] INVASIVE CV LAB;  Service: Cardiovascular;  Laterality: N/A;   LEFT HEART CATH AND CORONARY ANGIOGRAPHY N/A 06/11/2019   Procedure: LEFT HEART CATH AND CORONARY ANGIOGRAPHY;  Surgeon: Sammy Crisp, MD;  Location: [facility] INVASIVE CV LAB;  Service: Cardiovascular;  Laterality: N/A;   LEFT HEART CATH AND CORONARY ANGIOGRAPHY N/A 10/27/2020   Procedure: LEFT HEART CATH AND CORONARY ANGIOGRAPHY;  Surgeon: Millicent Ally, MD;  Location: [facility] INVASIVE CV LAB;  Service: Cardiovascular;  Laterality: N/A;   LEFT HEART CATHETERIZATION WITH CORONARY ANGIOGRAM N/A 08/02/2014   Procedure: LEFT HEART CATHETERIZATION WITH CORONARY ANGIOGRAM;  Surgeon: Lucendia Rusk, MD;  Location: [facility] CATH LAB;  Service: Cardiovascular;  Laterality: N/A;   PROSTATE SURGERY     SPINAL FUSION     L4-S1   SUBMUCOSAL LIFTING INJECTION  06/13/2023   Procedure: SUBMUCOSAL LIFTING INJECTION;  Surgeon: Normie Becton., MD;  Location: [facility] ENDOSCOPY;  Service: Gastroenterology;;   SUBMUCOSAL TATTOO INJECTION  06/13/2023   Procedure: SUBMUCOSAL TATTOO INJECTION;  Surgeon: Normie Becton., MD;  Location: [facility] ENDOSCOPY;  Service: Gastroenterology;;    Family History  Problem Relation Age of Onset   Heart attack Mother    Heart attack Father    Stroke Neg Hx     Social History Social History   Tobacco  Use   Smoking status: Never   Smokeless tobacco: Never  Vaping Use   Vaping status: Never Used  Substance Use Topics   Alcohol use: No   Drug use: No    Current Outpatient Medications  Medication Sig Dispense Refill   acetaminophen  (TYLENOL ) 650 MG CR tablet Take 1 tablet (650 mg total) by mouth every 8 (eight) hours as needed for pain.     amLODipine  (NORVASC ) 5 MG tablet Take 5 mg by mouth daily.     apixaban  (ELIQUIS ) 2.5 MG TABS tablet Take 1 tablet (2.5 mg total) by mouth 2 (two) times daily.     benzonatate  (TESSALON ) 100 MG capsule Take 1 capsule (100 mg total) by mouth every 8 (eight) hours. 21 capsule 0   carvedilol  (COREG ) 6.25 MG tablet TAKE 1 TABLET BY MOUTH TWICE A DAY (Patient taking differently: Take 6.25 mg by mouth 2 (two) times daily with a meal.) 180 tablet 1   Cholecalciferol  (VITAMIN D3) 25 MCG (1000 UT) CAPS Take 1,000 Units by mouth daily.     ciclopirox  (LOPROX )  0.77 % cream Apply between toes twice daily for 4 weeks. 30 g 1   clopidogrel  (PLAVIX ) 75 MG tablet Take 1 tablet (75 mg total) by mouth daily.     diltiazem  (CARDIZEM ) 30 MG tablet Take 1 tablet (30 mg total) by mouth as needed (Rapid Palpitations). 30 tablet 5   famotidine  (PEPCID ) 40 MG tablet Take 40 mg by mouth at bedtime.     fish oil -omega-3 fatty acids  1000 MG capsule Take 1 g by mouth at bedtime.     GALZIN 25 MG CAPS Take 1 capsule by mouth daily.     glipiZIDE  (GLUCOTROL ) 10 MG tablet Take 10 mg by mouth 2 (two) times daily.     Insulin  Degludec FlexTouch 100 UNIT/ML SOPN Inject 50 Units into the skin. Takes 46 units daily, adjusted per blood sugar     isosorbide  mononitrate (IMDUR ) 30 MG 24 hr tablet Take 30 mg by mouth every evening.     loperamide (IMODIUM) 2 MG capsule Take 2 mg by mouth as needed for diarrhea or loose stools.     nitroGLYCERIN  (NITROSTAT ) 0.4 MG SL tablet PLACE 1 TAB UNDER THE TONGUE EVERY 5 MINS AS NEEDED FOR CHEST PAIN FOR UP TO 3 DOSES 25 tablet 3   omeprazole  (PRILOSEC)  40 MG capsule Take 1 capsule (40 mg total) by mouth 2 (two) times daily before a meal. Take 30 minutes prior to meals 60 capsule 6   ondansetron  (ZOFRAN ) 4 MG tablet Take 1 tablet (4 mg total) by mouth every 8 (eight) hours as needed for nausea or vomiting. 30 tablet 1   oxymetazoline (AFRIN) 0.05 % nasal spray Place 2 sprays into both nostrils at bedtime.     polyethylene glycol (MIRALAX ) 17 g packet Take 17 g by mouth daily. 14 each 0   telmisartan  (MICARDIS ) 20 MG tablet Take 1 tablet (20 mg total) by mouth daily. 30 tablet 0   triamcinolone cream (KENALOG) 0.1 % Apply 1 Application topically 2 (two) times daily.     sucralfate  (CARAFATE ) 1 g tablet Take 1 tablet (1 g total) by mouth 2 (two) times daily for 14 days. 28 tablet 0   Current Facility-Administered Medications  Medication Dose Route Frequency Provider Last Rate Last Admin   Study - [name] - inclisiran 300 mg/1.5mL or placebo SQ injection (PI-Stuckey)  300 mg Subcutaneous Q6 months    300 mg at 12/19/23 1610    Allergies  Allergen Reactions   Bempedoic Acid  Other (See Comments)    NEXLETOL  Myalgias, GI upset   [name]  [Alirocumab ] Other (See Comments)    Muscle weakness and rash   Rosuvastatin  Other (See Comments)    Low energy/leg and hip pain   Zetia  [Ezetimibe ] Other (See Comments)    myalgias   Hydralazine  Other (See Comments)    Muscle Aches   Keflex [Cephalexin] Other (See Comments)    Reaction unk    Review of Systems  Constitutional:  Positive for activity change, fatigue and unexpected weight change.  HENT:  Positive for congestion.   Respiratory:  Positive for cough and shortness of breath.   Cardiovascular:        Atrial fibrillation  Gastrointestinal:  Positive for constipation.  Genitourinary:  Negative for difficulty urinating.  Musculoskeletal:  Positive for arthralgias and gait problem.  Skin:  Positive for rash.       itching  Neurological:  Positive for numbness.  Hematological:   Bruises/bleeds easily.  All other systems reviewed and are negative.  BP (!) 144/68 (BP Location: Right Arm, Patient Position: Sitting, Cuff Size: Normal)   Pulse 70   Resp 20   Ht 6\' 2"  (1.88 m)   Wt 217 lb 1.6 oz (98.5 kg)   SpO2 95% Comment: RA  BMI 27.87 kg/m  Physical Exam Constitutional:      General: He is not in acute distress.    Appearance: Normal appearance.  HENT:     Head: Normocephalic and atraumatic.  Eyes:     Extraocular Movements: Extraocular movements intact.     Pupils: Pupils are equal, round, and reactive to light.  Cardiovascular:     Rate and Rhythm: Normal rate and regular rhythm.     Heart sounds: Normal heart sounds. No murmur heard.    No friction rub. No gallop.  Pulmonary:     Effort: No respiratory distress.     Breath sounds: No wheezing or rales.     Comments: Coarse BS right base Abdominal:     Palpations: Abdomen is soft.  Lymphadenopathy:     Cervical: No cervical adenopathy.  Skin:    General: Skin is warm and dry.  Neurological:     General: No focal deficit present.     Mental Status: He is alert and oriented to person, place, and time.     Cranial Nerves: No cranial nerve deficit.     Motor: No weakness.    Diagnostic Tests: I personally reviewed the CT and PET/CT images.  CT shows an 8.1 x 6 centimeter consolidation at the right base with a 1.5 cm spiculated density in the medial right midlung.  Mild hilar and mediastinal adenopathy.  PET/CT showed these areas were hypermetabolic, although the 1.5 cm spiculated density was not seen.  Also reviewed CT from June 2024.  Some basilar atelectasis but no suspicious findings at that time.  Impression: Alvar Malinoski is an 83 year old man with a past medical history significant for coronary artery disease, MI, stents, stroke, paroxysmal atrial fibrillation, chronic anticoagulation, stage II chronic kidney disease, insulin -dependent type 2 diabetes, reflux, small bowel obstruction,  and recent pneumonia.  He is a lifelong non-smoker.  Right lower lobe consolidation versus mass-hypermetabolic on PET/CT.  Also hypermetabolic hilar and mediastinal lymph nodes.  Unfortunately PET/CT is not specific in this setting.  The findings could all be due to pneumonia.  Of course, an underlying malignancy cannot be excluded.  Of note, there was no suspicious lesion in that area a year ago.  This would be a remarkably rapid development if it did turn out to be a cancer.  The separate 1.5 cm nodule more superiorly in the lower lobe appears to be what prompted radiology to suggest a PET/CT.  As based on the reading there note.  That was not present 2 days later when the PET/CT was done.  He has been treated for pneumonia and the symptoms have improved but not completely resolved.  My recommendation was that we repeat a scan in about a month.  That would be 8 weeks from his prior scan.  Any sooner than that I do not think there will be significant interval for assessing change.  He is having a lot of reflux symptoms.  He has known reflux and his pain likely is due to that.  However he does have known coronary disease having an MI 20 years ago.  I have recommended that he also follow-up with cardiology.  Even if his pain is totally unrelated to his coronaries his general malaise  and fatigue could be related.  Plan: Return in 4 weeks with CT chest.  Will do super D protocol in case biopsy is warranted.  Zelphia Higashi, MD [facility] [phone]

## 2023-12-29 DIAGNOSIS — R110 Nausea: Secondary | ICD-10-CM | POA: Insufficient documentation

## 2023-12-29 DIAGNOSIS — K2100 Gastro-esophageal reflux disease with esophagitis, without bleeding: Secondary | ICD-10-CM | POA: Insufficient documentation

## 2024-01-03 ENCOUNTER — Encounter: Admitting: Thoracic Surgery (Cardiothoracic Vascular Surgery)

## 2024-01-16 DIAGNOSIS — R9389 Abnormal findings on diagnostic imaging of other specified body structures: Secondary | ICD-10-CM | POA: Insufficient documentation

## 2024-01-23 ENCOUNTER — Ambulatory Visit (HOSPITAL_COMMUNITY)
Admission: RE | Admit: 2024-01-23 | Discharge: 2024-01-23 | Disposition: A | Discharge status: Home / Self Care | Source: Ambulatory Visit | Attending: Thoracic Surgery (Cardiothoracic Vascular Surgery) | Admitting: Thoracic Surgery (Cardiothoracic Vascular Surgery)

## 2024-01-23 DIAGNOSIS — I700 Atherosclerosis of aorta: Secondary | ICD-10-CM | POA: Insufficient documentation

## 2024-01-23 DIAGNOSIS — R918 Other nonspecific abnormal finding of lung field: Secondary | ICD-10-CM | POA: Diagnosis present

## 2024-01-26 ENCOUNTER — Ambulatory Visit (INDEPENDENT_AMBULATORY_CARE_PROVIDER_SITE_OTHER): Admitting: Podiatry

## 2024-01-26 DIAGNOSIS — E1142 Type 2 diabetes mellitus with diabetic polyneuropathy: Secondary | ICD-10-CM

## 2024-01-26 DIAGNOSIS — M79675 Pain in left toe(s): Secondary | ICD-10-CM

## 2024-01-26 DIAGNOSIS — B351 Tinea unguium: Secondary | ICD-10-CM

## 2024-01-26 DIAGNOSIS — M79674 Pain in right toe(s): Secondary | ICD-10-CM | POA: Diagnosis not present

## 2024-01-26 NOTE — Progress Notes (Signed)
 Subjective:  Patient ID: Joseph [name], male    DOB: 1940/07/31,  MRN: [id number]  Joseph [name] presents to clinic today for:  Chief Complaint  Patient presents with   [facility]    Last A1c: 7 something. Takes Plavix  and Eliquis . Would like to know your opinion on OTC treatment for the tingling/nerve pain in his feet.    Patient notes nails are thick, discolored, elongated and painful in shoegear when trying to ambulate.  He is inquiring about any over-the-counter medications or treatments for neuropathy and jumpy legs in the evening.  He states he has to get on his stationary bike and cycle for approximately 10 minutes to get the symptoms to calm down.  PCP is Silvano Angeline FALCON, NP.  Past Medical History:  Diagnosis Date   Abdominal discomfort    Abdominal discomfort: EGD 2011 showed no significant abnormalities.  Possibly due to metformin .   AKI (acute kidney injury) (HCC)    Allergic rhinitis    Anticoagulated 05/30/2016   CAD (coronary artery disease)    a. s/p MI in 2004:  stents to the LAD and staged stent RCA, EF 55%;  b.  NSTEMI (1/16):  LHC - mid LAD stent with diff dist restenosis, prox to mid Dx mod dsz jailed by stent, small OM1 99, mCFX 75, dCFX 80, mRCA stent ok, mPDA mild to mod dsz, EF 55% >> PCI:  Synergy DES to LAD; Synergy DES (x2) mid and dist CFX   CAD in native artery 06/12/2019   Chronic kidney disease, stage 2, mildly decreased GFR    stage 2-3   DDD (degenerative disc disease)    L4 to S1 degenerative disease and spondylolisthesis: low back pain.  s/p back surgery   DM (diabetes mellitus) (HCC)    History of CVA (cerebrovascular accident)    Small CVA seen by head CT (incidental) in 1/13.  Carotid dopplers in 1/13 showed minimal disease   History of Doppler ultrasound    Arterial dopplers (3/11): no evidence for significant PAD. ABIs 10/12: Normal.     HLD (hyperlipidemia)    HTN (hypertension)    had side effects with chlorthalidone   Hx of  cardiovascular stress test    Myoview  (1/13): Small fixed apical septal defect with no ischemia, EF 58%.   Hx of echocardiogram    Echo (3/11): EF 60%, normal wall motion, mild MR, mild left atrial enlargement, mildly dilated ascending aorta.   PAF (paroxysmal atrial fibrillation) (HCC) 05/29/2016   Panniculitis    SBO (small bowel obstruction) (HCC)    Spondylolisthesis    L4-S1   Statin intolerance    Past Surgical History:  Procedure Laterality Date   BIOPSY  01/18/2023   Procedure: BIOPSY;  Surgeon: Wilhelmenia Aloha Raddle., MD;  Location: [facility] ENDOSCOPY;  Service: Gastroenterology;;   COLONOSCOPY N/A 12/26/2017   Procedure: COLONOSCOPY;  Surgeon: Leigh Elspeth SQUIBB, MD;  Location: [facility] ENDOSCOPY;  Service: Gastroenterology;  Laterality: N/A;   COLONOSCOPY W/ POLYPECTOMY  12/2017   CORONARY ANGIOGRAM  11/13/2014   Procedure: CORONARY ANGIOGRAM;  Surgeon: Ezra GORMAN Shuck, MD;  Location: [facility] CATH LAB;  Service: Cardiovascular;;   CORONARY STENT INTERVENTION N/A 05/18/2019   Procedure: CORONARY STENT INTERVENTION;  Surgeon: Anner Alm LELON, MD;  Location: [facility] INVASIVE CV LAB;  Service: Cardiovascular;  Laterality: N/A;   CORONARY STENT PLACEMENT  2004   x2 LAD and RCA    ENDOSCOPIC MUCOSAL RESECTION  06/13/2023   Procedure: ENDOSCOPIC  MUCOSAL RESECTION;  Surgeon: Wilhelmenia Aloha Raddle., MD;  Location: [facility] ENDOSCOPY;  Service: Gastroenterology;;   ESOPHAGOGASTRODUODENOSCOPY N/A 01/18/2023   Procedure: ESOPHAGOGASTRODUODENOSCOPY (EGD);  Surgeon: Wilhelmenia Aloha Raddle., MD;  Location: [facility] ENDOSCOPY;  Service: Gastroenterology;  Laterality: N/A;   ESOPHAGOGASTRODUODENOSCOPY (EGD) WITH PROPOFOL  N/A 06/13/2023   Procedure: ESOPHAGOGASTRODUODENOSCOPY (EGD) WITH PROPOFOL ;  Surgeon: Wilhelmenia Aloha Raddle., MD;  Location: [facility] ENDOSCOPY;  Service: Gastroenterology;  Laterality: N/A;   HEMOSTASIS CLIP PLACEMENT  06/13/2023   Procedure: HEMOSTASIS CLIP PLACEMENT;  Surgeon: Wilhelmenia Aloha Raddle., MD;   Location: [facility] ENDOSCOPY;  Service: Gastroenterology;;   LEFT HEART CATH AND CORONARY ANGIOGRAPHY N/A 05/18/2019   Procedure: LEFT HEART CATH AND CORONARY ANGIOGRAPHY;  Surgeon: Anner Alm ORN, MD;  Location: [facility] INVASIVE CV LAB;  Service: Cardiovascular;  Laterality: N/A;   LEFT HEART CATH AND CORONARY ANGIOGRAPHY N/A 06/11/2019   Procedure: LEFT HEART CATH AND CORONARY ANGIOGRAPHY;  Surgeon: Mady Bruckner, MD;  Location: [facility] INVASIVE CV LAB;  Service: Cardiovascular;  Laterality: N/A;   LEFT HEART CATH AND CORONARY ANGIOGRAPHY N/A 10/27/2020   Procedure: LEFT HEART CATH AND CORONARY ANGIOGRAPHY;  Surgeon: Burnard Debby LABOR, MD;  Location: [facility] INVASIVE CV LAB;  Service: Cardiovascular;  Laterality: N/A;   LEFT HEART CATHETERIZATION WITH CORONARY ANGIOGRAM N/A 08/02/2014   Procedure: LEFT HEART CATHETERIZATION WITH CORONARY ANGIOGRAM;  Surgeon: Candyce GORMAN Reek, MD;  Location: [facility] CATH LAB;  Service: Cardiovascular;  Laterality: N/A;   PROSTATE SURGERY     SPINAL FUSION     L4-S1   SUBMUCOSAL LIFTING INJECTION  06/13/2023   Procedure: SUBMUCOSAL LIFTING INJECTION;  Surgeon: Wilhelmenia Aloha Raddle., MD;  Location: [facility] ENDOSCOPY;  Service: Gastroenterology;;   SUBMUCOSAL TATTOO INJECTION  06/13/2023   Procedure: SUBMUCOSAL TATTOO INJECTION;  Surgeon: Wilhelmenia Aloha Raddle., MD;  Location: [facility] ENDOSCOPY;  Service: Gastroenterology;;   Allergies  Allergen Reactions   Bempedoic Acid  Other (See Comments)    NEXLETOL  Myalgias, GI upset   [name]  [Alirocumab ] Other (See Comments)    Muscle weakness and rash   Rosuvastatin  Other (See Comments)    Low energy/leg and hip pain   Zetia  [Ezetimibe ] Other (See Comments)    myalgias   Hydralazine  Other (See Comments)    Muscle Aches   Keflex [Cephalexin] Other (See Comments)    Reaction unk    Review of Systems: Negative except as noted in the HPI.  Objective:  Joseph [name] is a pleasant 83 y.o. male in NAD. AAO x 3.  Vascular  Examination: Capillary refill time is 3-5 seconds to toes bilateral. Palpable pedal pulses b/l LE. Digital hair present b/l.  Skin temperature gradient WNL b/l. No varicosities b/l. No cyanosis noted b/l.   Dermatological Examination: Pedal skin with normal turgor, texture and tone b/l. No open wounds. No interdigital macerations b/l. Toenails x10 are 3mm thick, discolored, dystrophic with subungual debris. There is pain with compression of the nail plates.  They are elongated x10  Assessment/Plan: 1. Pain due to onychomycosis of toenails of both feet   2. Diabetic peripheral neuropathy (HCC)     The mycotic toenails were sharply debrided x10 with sterile nail nippers and a power debriding burr to decrease bulk/thickness and length.    I wrote down several different over-the-counter oral and topical treatments for neuropathy symptoms for the patient to try.  He did not seem ready to start any type of prescription oral medication due to anticipated side effects.  Recommended alpha lipoic acid, vitamin B complex, capsaicin cream.  Once I  find some information in our office for [employer], we will mail this out to the patient to see if he is interested.  Medicare will cover this treatment.  It would be a series of 4 applications spread out over 91 days for each treatment therefore completing the treatment in approximately 1 years time.  This will have additive improvement over the course of each sequential application.  Will have the patient contact the office if he like to get set up with this.  Return in about 3 months (around 04/27/2024) for [facility].   Awanda CHARM Imperial, DPM, FACFAS [facility]     [address] [place], [place] [address]                Office [phone]  Fax [phone]

## 2024-01-31 ENCOUNTER — Ambulatory Visit: Admitting: Thoracic Surgery (Cardiothoracic Vascular Surgery)

## 2024-02-21 ENCOUNTER — Ambulatory Visit
Attending: Thoracic Surgery (Cardiothoracic Vascular Surgery) | Admitting: Thoracic Surgery (Cardiothoracic Vascular Surgery)

## 2024-02-21 VITALS — BP 165/77 | HR 70 | Resp 18 | Ht 74.0 in | Wt 222.0 lb

## 2024-02-21 DIAGNOSIS — R918 Other nonspecific abnormal finding of lung field: Secondary | ICD-10-CM | POA: Insufficient documentation

## 2024-02-21 NOTE — Progress Notes (Signed)
 [address] [place] [address]             [phone]     HPI: Joseph [name]  returns to discuss results of a follow-up CT  Joseph [name] is an 83 year old man with a past medical history significant for coronary artery disease, MI, stents, stroke, paroxysmal atrial fibrillation, chronic anticoagulation, stage II chronic kidney disease, insulin -dependent type 2 diabetes, reflux, small bowel obstruction, and recent pneumonia.  He is a lifelong non-smoker.   Back in May he presented with an ongoing productive cough and chest pain.  He ended up having a CT of the chest which showed a 8.1 x 5.6 cm masslike consolidative opacity in the right lower lobe posteriorly.  That was followed shortly thereafter by a PET/CT which showed the area was hypermetabolic.  There also was some hypermetabolic adenopathy in the hilum around that region.  I recommended an interval CT follow-up.  That was done on 01/23/2024.  In the interim his cough and chest pain had improved.  Currently feels well.  Past Medical History:  Diagnosis Date   Abdominal discomfort    Abdominal discomfort: EGD 2011 showed no significant abnormalities.  Possibly due to metformin .   AKI (acute kidney injury) (HCC)    Allergic rhinitis    Anticoagulated 05/30/2016   CAD (coronary artery disease)    a. s/p MI in 2004:  stents to the LAD and staged stent RCA, EF 55%;  b.  NSTEMI (1/16):  LHC - mid LAD stent with diff dist restenosis, prox to mid Dx mod dsz jailed by stent, small OM1 99, mCFX 75, dCFX 80, mRCA stent ok, mPDA mild to mod dsz, EF 55% >> PCI:  Synergy DES to LAD; Synergy DES (x2) mid and dist CFX   CAD in native artery 06/12/2019   Chronic kidney disease, stage 2, mildly decreased GFR    stage 2-3   DDD (degenerative disc disease)    L4 to S1 degenerative disease and spondylolisthesis: low back pain.  s/p back surgery   DM (diabetes mellitus) (HCC)    History of CVA (cerebrovascular accident)     Small CVA seen by head CT (incidental) in 1/13.  Carotid dopplers in 1/13 showed minimal disease   History of Doppler ultrasound    Arterial dopplers (3/11): no evidence for significant PAD. ABIs 10/12: Normal.     HLD (hyperlipidemia)    HTN (hypertension)    had side effects with chlorthalidone   Hx of cardiovascular stress test    Myoview  (1/13): Small fixed apical septal defect with no ischemia, EF 58%.   Hx of echocardiogram    Echo (3/11): EF 60%, normal wall motion, mild MR, mild left atrial enlargement, mildly dilated ascending aorta.   PAF (paroxysmal atrial fibrillation) (HCC) 05/29/2016   Panniculitis    SBO (small bowel obstruction) (HCC)    Spondylolisthesis    L4-S1   Statin intolerance     Current Outpatient Medications  Medication Sig Dispense Refill   acetaminophen  (TYLENOL ) 650 MG CR tablet Take 1 tablet (650 mg total) by mouth every 8 (eight) hours as needed for pain.     amLODipine  (NORVASC ) 5 MG tablet Take 5 mg by mouth daily.     apixaban  (ELIQUIS ) 2.5 MG TABS tablet Take 1 tablet (2.5 mg total) by mouth 2 (two) times daily.     benzonatate  (TESSALON ) 100 MG capsule Take 1 capsule (100 mg total) by mouth every 8 (  eight) hours. 21 capsule 0   carvedilol  (COREG ) 6.25 MG tablet TAKE 1 TABLET BY MOUTH TWICE A DAY (Patient taking differently: Take 6.25 mg by mouth 2 (two) times daily with a meal.) 180 tablet 1   Cholecalciferol  (VITAMIN D3) 25 MCG (1000 UT) CAPS Take 1,000 Units by mouth daily.     ciclopirox  (LOPROX ) 0.77 % cream Apply between toes twice daily for 4 weeks. 30 g 1   clopidogrel  (PLAVIX ) 75 MG tablet Take 1 tablet (75 mg total) by mouth daily.     diltiazem  (CARDIZEM ) 30 MG tablet Take 1 tablet (30 mg total) by mouth as needed (Rapid Palpitations). 30 tablet 5   famotidine  (PEPCID ) 40 MG tablet Take 40 mg by mouth at bedtime.     fish oil -omega-3 fatty acids  1000 MG capsule Take 1 g by mouth at bedtime.     GALZIN 25 MG CAPS Take 1 capsule by mouth  daily.     glipiZIDE  (GLUCOTROL ) 10 MG tablet Take 10 mg by mouth 2 (two) times daily.     Insulin  Degludec FlexTouch 100 UNIT/ML SOPN Inject 50 Units into the skin. Takes 46 units daily, adjusted per blood sugar     isosorbide  mononitrate (IMDUR ) 30 MG 24 hr tablet Take 30 mg by mouth every evening.     loperamide (IMODIUM) 2 MG capsule Take 2 mg by mouth as needed for diarrhea or loose stools.     nitroGLYCERIN  (NITROSTAT ) 0.4 MG SL tablet PLACE 1 TAB UNDER THE TONGUE EVERY 5 MINS AS NEEDED FOR CHEST PAIN FOR UP TO 3 DOSES 25 tablet 3   omeprazole  (PRILOSEC) 40 MG capsule Take 1 capsule (40 mg total) by mouth 2 (two) times daily before a meal. Take 30 minutes prior to meals 60 capsule 6   oxymetazoline (AFRIN) 0.05 % nasal spray Place 2 sprays into both nostrils at bedtime.     polyethylene glycol (MIRALAX ) 17 g packet Take 17 g by mouth daily. 14 each 0   sucralfate  (CARAFATE ) 1 g tablet Take 1 tablet (1 g total) by mouth 2 (two) times daily for 14 days. 28 tablet 0   telmisartan  (MICARDIS ) 20 MG tablet Take 1 tablet (20 mg total) by mouth daily. 30 tablet 0   triamcinolone cream (KENALOG) 0.1 % Apply 1 Application topically 2 (two) times daily.     Current Facility-Administered Medications  Medication Dose Route Frequency Provider Last Rate Last Admin   Study - [name] - inclisiran 300 mg/1.89mL or placebo SQ injection (PI-Stuckey)  300 mg Subcutaneous Q6 months    300 mg at 12/19/23 9061    Physical Exam BP (!) 165/77 (BP Location: Left Arm)   Pulse 70   Resp 18   Ht 6' 2 (1.88 m)   Wt 222 lb (100.7 kg)   SpO2 93%   BMI 28.42 kg/m  83 year old man in no acute distress Alert and oriented x 3 with no focal deficits Lungs clear bilaterally Cardiac regular rate and rhythm  Diagnostic Tests: CT CHEST WITHOUT CONTRAST   TECHNIQUE: Multidetector CT imaging of the chest was performed using thin slice collimation for electromagnetic bronchoscopy planning purposes, without  intravenous contrast.   RADIATION DOSE REDUCTION: This exam was performed according to the departmental dose-optimization program which includes automated exposure control, adjustment of the mA and/or kV according to patient size and/or use of iterative reconstruction technique.   COMPARISON:  Chest CTA 01/16/2023   FINDINGS: Cardiovascular: The heart size is normal. No substantial pericardial effusion.  Coronary artery calcification is evident. Mild atherosclerotic calcification is noted in the wall of the thoracic aorta.   Mediastinum/Nodes: No mediastinal lymphadenopathy. No evidence for gross hilar lymphadenopathy although assessment is limited by the lack of intravenous contrast on the current study. The esophagus has normal imaging features. There is no axillary lymphadenopathy.   Lungs/Pleura: Tiny calcified granulomas seen left upper lobe. 4 mm central right lung nodule on 41/302 is stable. 4 mm right middle lobe perifissural nodule on 61/302 and 7 mm perifissural right lung nodule on 66/302 are both stable since prior. 5 mm left upper lobe nodule on 63/302 is stable.   Consolidative opacity with air bronchograms in the posterior right lower lobe inferiorly has decreased substantially in the interval with a bandlike area of volume loss and consolidative opacity in this region today, potentially post infectious/inflammatory scarring. Similar appearance chronic atelectasis or scarring in the posterior left lower lobe. No pulmonary edema or pleural effusion on today's exam.   Upper Abdomen: Visualized portion of the upper abdomen shows no acute findings.   Musculoskeletal: No worrisome lytic or sclerotic osseous abnormality. Advanced degenerative changes are seen in the left shoulder with intra-articular loose bodies.   IMPRESSION: 1. Interval decrease in consolidative opacity in the posterior right lower lobe inferiorly with a bandlike area of volume loss  and consolidative opacity in this region today, potentially post infectious/inflammatory scarring. 2. Stable bilateral pulmonary nodules measuring up to 7 mm. Future CT at 12-18 months (from today's scan) is considered optional for low-risk patients, but is recommended for high-risk patients. This recommendation follows the consensus statement: Guidelines for Management of Incidental Pulmonary Nodules Detected on CT Images: From the Fleischner Society 2017; Radiology 2017; 284:228-243. 3.  Aortic Atherosclerosis (ICD10-I70.0).     Electronically Signed   By: Camellia Candle M.D.   On: 01/24/2024 08:52 I personally reviewed the CT images and compared them to the CT images from 2 months prior.  There is near complete resolution of the right lower lobe consolidative opacity.  Some minor residual scarring.  Bilateral nodules measuring up to 7 mm.  Impression: Joseph [name] is an 83 year old man with a past medical history significant for coronary artery disease, MI, stents, stroke, paroxysmal atrial fibrillation, chronic anticoagulation, stage II chronic kidney disease, insulin -dependent type 2 diabetes, reflux, small bowel obstruction, and recent pneumonia.  He is a lifelong non-smoker.   Presented with persistent cough and chest pain.  Found to have a masslike consolidation in the right base.  Was hypermetabolic on PET/CT.  Follow-up CT about 2 months from his original scan showed near complete resolution consistent with treated pneumonia.  He does have some small 7 mm nodules in the lungs bilaterally.  Will plan another CT in 6 months to check on those.  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  Unlikely to cause difficulties in this elderly gentleman.  Plan: Return in 6 months with CT chest to follow-up on lung nodules.  Elspeth JAYSON Millers, MD [facility] [phone]

## 2024-03-08 DIAGNOSIS — G2581 Restless legs syndrome: Secondary | ICD-10-CM | POA: Insufficient documentation

## 2024-03-15 ENCOUNTER — Ambulatory Visit (HOSPITAL_BASED_OUTPATIENT_CLINIC_OR_DEPARTMENT_OTHER): Admit: 2024-03-15 | Discharge: 2024-03-15 | Disposition: A | Discharge status: Home / Self Care | Admitting: Radiology

## 2024-03-15 ENCOUNTER — Encounter (HOSPITAL_BASED_OUTPATIENT_CLINIC_OR_DEPARTMENT_OTHER): Payer: Self-pay | Admitting: Emergency Medicine

## 2024-03-15 ENCOUNTER — Ambulatory Visit (HOSPITAL_BASED_OUTPATIENT_CLINIC_OR_DEPARTMENT_OTHER)
Admission: EM | Admit: 2024-03-15 | Discharge: 2024-03-15 | Disposition: A | Discharge status: Home / Self Care | Attending: Family Medicine | Admitting: Family Medicine

## 2024-03-15 DIAGNOSIS — R1011 Right upper quadrant pain: Secondary | ICD-10-CM

## 2024-03-15 DIAGNOSIS — K5900 Constipation, unspecified: Secondary | ICD-10-CM

## 2024-03-15 DIAGNOSIS — R1012 Left upper quadrant pain: Secondary | ICD-10-CM | POA: Diagnosis not present

## 2024-03-15 DIAGNOSIS — R1032 Left lower quadrant pain: Secondary | ICD-10-CM

## 2024-03-15 DIAGNOSIS — R1031 Right lower quadrant pain: Secondary | ICD-10-CM

## 2024-03-15 NOTE — ED Triage Notes (Signed)
 Pt c/o burning sensation in his stomach for 3-4 weeks he has seen his primary but they didn't give him any medication they just done blood work. Pt reports a few months ago he was at [facility] he had a ED and they only found a polyp they have done two CT scans also on pt he was told to come back in 6 months for repeat CT.

## 2024-03-15 NOTE — Discharge Instructions (Signed)
 Constipation with abdominal pain: History and exam findings are consistent with chronic constipation.  Abdominal x-ray shows stool and gas in the bowel.  Patient needs to get empty of stool.  Use MiraLAX , 1 capful in 8 ounces of liquids when he gets home and 4 hours later today.  Tomorrow start a bowel cleanse with magnesium citrate.  Follow-up with primary care.  May need to see gastroenterology due to chronic constipation.  Provided handout on magnesium citrate bowel cleanse and also on the constipation recipe to try to promote more regular bowel movements.

## 2024-03-15 NOTE — ED Provider Notes (Signed)
 [name]    CSN: [id number] Arrival date & time: 03/15/24  1348      History   Chief Complaint No chief complaint on file.   HPI Joseph [name] is a 83 y.o. male.   83 year old male with report of abdominal pain since 02/10/2024 or earlier.  He reports he has had chronic constipation with intermittent loose stools.  He is having to use laxatives and often has to strain to have a bowel movement.  Many times his bowel movements are hard balls.  He has had chronic abdominal pain since 02/10/2024.  Sometimes with laxatives he will get relief for a few days.  He reports A previous visit to [facility] health emergency department and they only found a polyp.  He has had CT scans of his abdomen in June and August 2024 that did not show any acute problems.  He denies fever, nausea, vomiting.     Past Medical History:  Diagnosis Date   Abdominal discomfort    Abdominal discomfort: EGD 2011 showed no significant abnormalities.  Possibly due to metformin .   AKI (acute kidney injury) (HCC)    Allergic rhinitis    Anticoagulated 05/30/2016   CAD (coronary artery disease)    a. s/p MI in 2004:  stents to the LAD and staged stent RCA, EF 55%;  b.  NSTEMI (1/16):  LHC - mid LAD stent with diff dist restenosis, prox to mid Dx mod dsz jailed by stent, small OM1 99, mCFX 75, dCFX 80, mRCA stent ok, mPDA mild to mod dsz, EF 55% >> PCI:  Synergy DES to LAD; Synergy DES (x2) mid and dist CFX   CAD in native artery 06/12/2019   Chronic kidney disease, stage 2, mildly decreased GFR    stage 2-3   DDD (degenerative disc disease)    L4 to S1 degenerative disease and spondylolisthesis: low back pain.  s/p back surgery   DM (diabetes mellitus) (HCC)    History of CVA (cerebrovascular accident)    Small CVA seen by head CT (incidental) in 1/13.  Carotid dopplers in 1/13 showed minimal disease   History of Doppler ultrasound    Arterial dopplers (3/11): no evidence for significant PAD. ABIs 10/12:  Normal.     HLD (hyperlipidemia)    HTN (hypertension)    had side effects with chlorthalidone   Hx of cardiovascular stress test    Myoview  (1/13): Small fixed apical septal defect with no ischemia, EF 58%.   Hx of echocardiogram    Echo (3/11): EF 60%, normal wall motion, mild MR, mild left atrial enlargement, mildly dilated ascending aorta.   PAF (paroxysmal atrial fibrillation) (HCC) 05/29/2016   Panniculitis    SBO (small bowel obstruction) (HCC)    Spondylolisthesis    L4-S1   Statin intolerance     Patient Active Problem List   Diagnosis Date Noted   Abnormal computerized axial tomography of chest 01/16/2024   Gastro-esophageal reflux disease with esophagitis 12/29/2023   Nausea 12/29/2023   Lung nodules 12/27/2023   Dyspnea 12/01/2023   Acute upper respiratory infection 11/30/2023   Bronchiectasis (HCC) 11/28/2023   Seasonal allergies 11/27/2023   History of malignant neoplasm of prostate 11/24/2023   Esophagogastric junction polyp 07/14/2023   Adenomatous duodenal polyp 06/13/2023   Headache 06/08/2023   Nasal congestion 06/08/2023   Leukocytosis 04/28/2023   Olecranon bursitis of right elbow 04/05/2023   Constipation 02/17/2023   Right upper quadrant pain 02/17/2023   Other specified abnormal findings  of blood chemistry 02/09/2023   Pressure ulcer of sacral region, stage 2 (HCC) 01/31/2023   Abdominal pain 01/17/2023   Pain in the abdomen 01/16/2023   Hyponatremia 01/16/2023   AKI (acute kidney injury) (HCC) 01/16/2023   Elevated troponin 01/16/2023   Overweight 01/16/2023   Thoracic ascending aortic aneurysm (HCC) 01/16/2023   Chill 01/13/2023   SBO (small bowel obstruction) (HCC) 01/03/2023   CKD (chronic kidney disease), stage III (HCC) 01/02/2023   Small bowel obstruction (HCC) 01/02/2023   Body mass index (BMI) 28.0-28.9, adult 12/21/2022   Palpitations 12/16/2022   Fall on same level from slipping, tripping and stumbling without subsequent striking  against object, subsequent encounter 10/04/2022   Open wound of hand 09/29/2022   Cerebrospinal fluid abnormal 03/30/2022   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   Nutritional anemia 12/10/2021   Abnormal feces 06/29/2021   COVID-19 07/08/2020   Pain in throat 05/30/2020   Allergic rhinitis 02/21/2020   Acute frontal sinusitis 10/04/2019   Cough 10/04/2019   Other dietary vitamin B12 deficiency anemia 06/27/2019   CAD in native artery 06/12/2019   Unstable angina (HCC) 05/17/2019   Simple goiter 03/20/2019   Gastritis 08/22/2018   Radiculopathy due to lumbar intervertebral disc disorder 08/09/2018   Insomnia 07/04/2018   Pain of right lower leg 07/04/2018   Lower GI bleed 12/26/2017   Postural dizziness with presyncope 12/26/2017   Coronary artery disease of native artery of native heart with stable angina pectoris (HCC) 04/11/2017   Hyperlipidemia 04/11/2017   Anticoagulated 05/30/2016   Paroxysmal atrial fibrillation (HCC) 05/29/2016   Disequilibrium 12/05/2015   Laceration of hand without foreign body 11/26/2015   Localized edema 06/26/2015   Pain of left lower leg 06/26/2015   Exertional angina (HCC) 08/02/2014   Bilateral impacted cerumen 07/09/2014   Atopic dermatitis 09/10/2013   Polyp of stomach and duodenum 11/23/2012   Tachycardia 02/02/2012   Onychia of finger 12/16/2011   History of CVA (cerebrovascular accident) 08/01/2011   Leg pain 03/31/2011   Abdominal pain 10/13/2010   Hip pain 10/13/2010   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   Essential hypertension 09/09/2009   Peripheral vascular disease (HCC) 09/09/2009   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   CAD S/P percutaneous coronary angioplasty 09/08/2009    Past Surgical History:  Procedure Laterality Date   BIOPSY  01/18/2023   Procedure: BIOPSY;  Surgeon: Wilhelmenia Aloha Raddle., MD;  Location: [facility] ENDOSCOPY;  Service: Gastroenterology;;   COLONOSCOPY N/A  12/26/2017   Procedure: COLONOSCOPY;  Surgeon: Leigh Elspeth SQUIBB, MD;  Location: [facility] ENDOSCOPY;  Service: Gastroenterology;  Laterality: N/A;   COLONOSCOPY W/ POLYPECTOMY  12/2017   CORONARY ANGIOGRAM  11/13/2014   Procedure: CORONARY ANGIOGRAM;  Surgeon: Ezra GORMAN Shuck, MD;  Location: [facility] CATH LAB;  Service: Cardiovascular;;   CORONARY STENT INTERVENTION N/A 05/18/2019   Procedure: CORONARY STENT INTERVENTION;  Surgeon: Anner Alm ORN, MD;  Location: [facility] INVASIVE CV LAB;  Service: Cardiovascular;  Laterality: N/A;   CORONARY STENT PLACEMENT  2004   x2 LAD and RCA    ENDOSCOPIC MUCOSAL RESECTION  06/13/2023   Procedure: ENDOSCOPIC MUCOSAL RESECTION;  Surgeon: Wilhelmenia Aloha Raddle., MD;  Location: [facility] ENDOSCOPY;  Service: Gastroenterology;;   ESOPHAGOGASTRODUODENOSCOPY N/A 01/18/2023   Procedure: ESOPHAGOGASTRODUODENOSCOPY (EGD);  Surgeon: Wilhelmenia Aloha Raddle., MD;  Location: [facility] ENDOSCOPY;  Service: Gastroenterology;  Laterality: N/A;   ESOPHAGOGASTRODUODENOSCOPY (EGD) WITH PROPOFOL  N/A 06/13/2023   Procedure: ESOPHAGOGASTRODUODENOSCOPY (EGD) WITH PROPOFOL ;  Surgeon: Wilhelmenia Aloha Raddle.,  MD;  Location: [facility] ENDOSCOPY;  Service: Gastroenterology;  Laterality: N/A;   HEMOSTASIS CLIP PLACEMENT  06/13/2023   Procedure: HEMOSTASIS CLIP PLACEMENT;  Surgeon: Wilhelmenia Aloha Raddle., MD;  Location: [facility] ENDOSCOPY;  Service: Gastroenterology;;   LEFT HEART CATH AND CORONARY ANGIOGRAPHY N/A 05/18/2019   Procedure: LEFT HEART CATH AND CORONARY ANGIOGRAPHY;  Surgeon: Anner Alm ORN, MD;  Location: [facility] INVASIVE CV LAB;  Service: Cardiovascular;  Laterality: N/A;   LEFT HEART CATH AND CORONARY ANGIOGRAPHY N/A 06/11/2019   Procedure: LEFT HEART CATH AND CORONARY ANGIOGRAPHY;  Surgeon: Mady Bruckner, MD;  Location: [facility] INVASIVE CV LAB;  Service: Cardiovascular;  Laterality: N/A;   LEFT HEART CATH AND CORONARY ANGIOGRAPHY N/A 10/27/2020   Procedure: LEFT HEART CATH AND CORONARY ANGIOGRAPHY;  Surgeon: Burnard Debby LABOR, MD;  Location: [facility] INVASIVE CV LAB;  Service: Cardiovascular;  Laterality: N/A;   LEFT HEART CATHETERIZATION WITH CORONARY ANGIOGRAM N/A 08/02/2014   Procedure: LEFT HEART CATHETERIZATION WITH CORONARY ANGIOGRAM;  Surgeon: Candyce GORMAN Reek, MD;  Location[facility] CATH LAB;  Service: Cardiovascular;  Laterality: N/A;   PROSTATE SURGERY     SPINAL FUSION     L4-S1   SUBMUCOSAL LIFTING INJECTION  06/13/2023   Procedure: SUBMUCOSAL LIFTING INJECTION;  Surgeon: Wilhelmenia Aloha Raddle., MD;  Location: [facility] ENDOSCOPY;  Service: Gastroenterology;;   SUBMUCOSAL TATTOO INJECTION  06/13/2023   Procedure: SUBMUCOSAL TATTOO INJECTION;  Surgeon: Wilhelmenia Aloha Raddle., MD;  Location: [facility] ENDOSCOPY;  Service: Gastroenterology;;       Home Medications    Prior to Admission medications   Medication Sig Start Date End Date Taking? Authorizing Provider  carvedilol  (COREG ) 6.25 MG tablet TAKE 1 TABLET BY MOUTH TWICE A DAY Patient taking differently: Take 6.25 mg by mouth 2 (two) times daily with a meal. 06/29/22  Yes Nahser, Aleene PARAS, MD  clopidogrel  (PLAVIX ) 75 MG tablet Take 1 tablet (75 mg total) by mouth daily. 06/16/23  Yes Mansouraty, Aloha Raddle., MD  glipiZIDE  (GLUCOTROL ) 10 MG tablet Take 10 mg by mouth 2 (two) times daily. 01/24/23  Yes [provider]  isosorbide  mononitrate (IMDUR ) 30 MG 24 hr tablet Take 30 mg by mouth every evening.   Yes [provider]  telmisartan  (MICARDIS ) 20 MG tablet Take 1 tablet (20 mg total) by mouth daily. 01/06/23  Yes Tobie Yetta HERO, MD  acetaminophen  (TYLENOL ) 650 MG CR tablet Take 1 tablet (650 mg total) by mouth every 8 (eight) hours as needed for pain. 01/06/23   Tobie Yetta HERO, MD  amLODipine  (NORVASC ) 5 MG tablet Take 5 mg by mouth daily. 04/30/23   [provider]  apixaban  (ELIQUIS ) 2.5 MG TABS tablet Take 1 tablet (2.5 mg total) by mouth 2 (two) times daily. 06/15/23   Mansouraty, Aloha Raddle., MD  benzonatate  (TESSALON ) 100 MG  capsule Take 1 capsule (100 mg total) by mouth every 8 (eight) hours. 12/03/23   Horton, Roxie HERO, DO  Cholecalciferol  (VITAMIN D3) 25 MCG (1000 UT) CAPS Take 1,000 Units by mouth daily.    [provider]  ciclopirox  (LOPROX ) 0.77 % cream Apply between toes twice daily for 4 weeks. 10/27/23   McCaughan, Dia D, DPM  diltiazem  (CARDIZEM ) 30 MG tablet Take 1 tablet (30 mg total) by mouth as needed (Rapid Palpitations). 01/31/18   Lelon Hamilton T, PA-C  famotidine  (PEPCID ) 40 MG tablet Take 40 mg by mouth at bedtime. 02/09/23   [provider]  fish oil -omega-3 fatty acids  1000 MG capsule Take 1 g by mouth at  bedtime.    [provider]  GALZIN 25 MG CAPS Take 1 capsule by mouth daily. 01/24/23   [provider]  Insulin  Degludec FlexTouch 100 UNIT/ML SOPN Inject 50 Units into the skin. Takes 46 units daily, adjusted per blood sugar    [provider]  loperamide (IMODIUM) 2 MG capsule Take 2 mg by mouth as needed for diarrhea or loose stools.    [provider]  nitroGLYCERIN  (NITROSTAT ) 0.4 MG SL tablet PLACE 1 TAB UNDER THE TONGUE EVERY 5 MINS AS NEEDED FOR CHEST PAIN FOR UP TO 3 DOSES 05/07/22   Nahser, Aleene PARAS, MD  omeprazole  (PRILOSEC) 40 MG capsule Take 1 capsule (40 mg total) by mouth 2 (two) times daily before a meal. Take 30 minutes prior to meals 06/13/23   Mansouraty, Aloha Raddle., MD  oxymetazoline (AFRIN) 0.05 % nasal spray Place 2 sprays into both nostrils at bedtime.    [provider]  polyethylene glycol (MIRALAX ) 17 g packet Take 17 g by mouth daily. 01/06/23   Tobie Yetta HERO, MD  sucralfate  (CARAFATE ) 1 g tablet Take 1 tablet (1 g total) by mouth 2 (two) times daily for 14 days. 06/13/23   Mansouraty, Gabriel Jr., MD  triamcinolone cream (KENALOG) 0.1 % Apply 1 Application topically 2 (two) times daily. 03/28/23   [provider]    Family History Family History  Problem Relation Age of Onset   Heart attack Mother     Heart attack Father    Stroke Neg Hx     Social History Social History   Tobacco Use   Smoking status: Never   Smokeless tobacco: Never  Vaping Use   Vaping status: Never Used  Substance Use Topics   Alcohol use: No   Drug use: No     Allergies   Bempedoic acid , [name]  [alirocumab ], Rosuvastatin , Zetia  [ezetimibe ], Hydralazine , and Keflex [cephalexin]   Review of Systems Review of Systems  Constitutional:  Negative for chills and fever.  HENT:  Negative for ear pain and sore throat.   Eyes:  Negative for pain and visual disturbance.  Respiratory:  Negative for cough.   Cardiovascular:  Negative for chest pain and palpitations.  Gastrointestinal:  Positive for abdominal pain, constipation and diarrhea. Negative for nausea and vomiting.  Genitourinary:  Negative for dysuria and hematuria.  Musculoskeletal:  Negative for arthralgias and back pain.  Skin:  Negative for color change and rash.  Neurological:  Negative for seizures and syncope.  All other systems reviewed and are negative.    Physical Exam Triage Vital Signs ED Triage Vitals  Encounter Vitals Group     BP 03/15/24 1359 (!) 144/84     Girls Systolic BP Percentile --      Girls Diastolic BP Percentile --      Boys Systolic BP Percentile --      Boys Diastolic BP Percentile --      Pulse Rate 03/15/24 1359 66     Resp 03/15/24 1359 18     Temp 03/15/24 1359 (!) 97.5 F (36.4 C)     Temp Source 03/15/24 1359 Oral     SpO2 03/15/24 1359 96 %     Weight --      Height --      Head Circumference --      Peak Flow --      Pain Score 03/15/24 1358 5     Pain Loc --      Pain Education --  Exclude from Growth Chart --    No data found.  Updated Vital Signs BP (!) 144/84 (BP Location: Right Arm)   Pulse 66   Temp (!) 97.5 F (36.4 C) (Oral)   Resp 18   SpO2 96%   Visual Acuity Right Eye Distance:   Left Eye Distance:   Bilateral Distance:    Right Eye Near:   Left Eye Near:     Bilateral Near:     Physical Exam Vitals and nursing note reviewed.  Constitutional:      General: He is not in acute distress.    Appearance: He is well-developed. He is not ill-appearing or toxic-appearing.  HENT:     Head: Normocephalic and atraumatic.     Right Ear: Hearing, tympanic membrane, ear canal and external ear normal.     Left Ear: Hearing, tympanic membrane, ear canal and external ear normal.     Nose: No congestion or rhinorrhea.     Right Sinus: No maxillary sinus tenderness or frontal sinus tenderness.     Left Sinus: No maxillary sinus tenderness or frontal sinus tenderness.     Mouth/Throat:     Lips: Pink.     Mouth: Mucous membranes are moist.     Pharynx: Uvula midline. No oropharyngeal exudate or posterior oropharyngeal erythema.     Tonsils: No tonsillar exudate.  Eyes:     Conjunctiva/sclera: Conjunctivae normal.     Pupils: Pupils are equal, round, and reactive to light.  Cardiovascular:     Rate and Rhythm: Normal rate and regular rhythm.     Heart sounds: S1 normal and S2 normal. No murmur heard. Pulmonary:     Effort: Pulmonary effort is normal. No respiratory distress.     Breath sounds: Normal breath sounds. No decreased breath sounds, wheezing, rhonchi or rales.  Abdominal:     General: Bowel sounds are normal.     Palpations: Abdomen is soft.     Tenderness: There is abdominal tenderness in the right upper quadrant, right lower quadrant, left upper quadrant and left lower quadrant. There is no right CVA tenderness, left CVA tenderness, guarding or rebound. Negative signs include Murphy's sign, Rovsing's sign and McBurney's sign.  Musculoskeletal:        General: No swelling.     Cervical back: Neck supple.  Lymphadenopathy:     Head:     Right side of head: No submental, submandibular, tonsillar, preauricular or posterior auricular adenopathy.     Left side of head: No submental, submandibular, tonsillar, preauricular or posterior auricular  adenopathy.     Cervical: No cervical adenopathy.     Right cervical: No superficial cervical adenopathy.    Left cervical: No superficial cervical adenopathy.  Skin:    General: Skin is warm and dry.     Capillary Refill: Capillary refill takes less than 2 seconds.     Findings: No rash.  Neurological:     Mental Status: He is alert and oriented to person, place, and time.  Psychiatric:        Mood and Affect: Mood normal.      UC Treatments / Results  Labs (all labs ordered are listed, but only abnormal results are displayed) Labs Reviewed - No data to display  EKG   Radiology DG Abd 1 View Result Date: 03/15/2024 CLINICAL DATA:  Abdominal pain. EXAM: ABDOMEN - 1 VIEW COMPARISON:  Radiograph dated 12/29/2023. FINDINGS: No bowel dilatation or evidence of obstruction. Multiple bilateral renal calculi noted. Postsurgical  changes of prostatectomy as well as lower lumbar fusion. Degenerative changes of the spine. No acute osseous pathology. IMPRESSION: 1. No bowel obstruction. 2. Bilateral renal calculi. Electronically Signed   By: Vanetta Chou M.D.   On: 03/15/2024 16:04    Procedures Procedures (including critical care time)  Medications Ordered in UC Medications - No data to display  Initial Impression / Assessment and Plan / UC Course  I have reviewed the triage vital signs and the nursing notes.  Pertinent labs & imaging results that were available during my care of the patient were reviewed by me and considered in my medical decision making (see chart for details).  Plan of Care: Constipation abdominal pain: See discharge instructions for more instructions.  Given handouts on using magnesium citrate for a bowel cleanse and also the constipation recipe to prevent constipation.  Use MiraLAX , 1 capful in 8 ounces of liquids when he gets home and again 4 hours later today.  Get plenty of fluids.  Do the magnesium citrate bowel cleanse tomorrow.  Follow-up with primary  care.  Patient may need referral to gastroenterology.  Follow-up here as needed.  I reviewed the plan of care with the patient and/or the patient's guardian.  The patient and/or guardian had time to ask questions and acknowledged that the questions were answered.  I provided instruction on symptoms or reasons to return here or to go to an ER, if symptoms/condition did not improve, worsened or if new symptoms occurred.  Final Clinical Impressions(s) / UC Diagnoses   Final diagnoses:  Left lower quadrant abdominal pain  Left upper quadrant abdominal pain  Right lower quadrant abdominal pain  Right upper quadrant abdominal pain  Constipation, unspecified constipation type     Discharge Instructions      Constipation with abdominal pain: History and exam findings are consistent with chronic constipation.  Abdominal x-ray shows stool and gas in the bowel.  Patient needs to get empty of stool.  Use MiraLAX , 1 capful in 8 ounces of liquids when he gets home and 4 hours later today.  Tomorrow start a bowel cleanse with magnesium citrate.  Follow-up with primary care.  May need to see gastroenterology due to chronic constipation.  Provided handout on magnesium citrate bowel cleanse and also on the constipation recipe to try to promote more regular bowel movements.     ED Prescriptions   None    PDMP not reviewed this encounter.   Ival Domino, FNP 03/15/24 [id number]

## 2024-04-26 ENCOUNTER — Ambulatory Visit: Admitting: Podiatry

## 2024-05-03 ENCOUNTER — Ambulatory Visit: Admitting: Podiatry

## 2024-05-11 ENCOUNTER — Ambulatory Visit (INDEPENDENT_AMBULATORY_CARE_PROVIDER_SITE_OTHER): Admitting: Podiatry

## 2024-05-11 DIAGNOSIS — B351 Tinea unguium: Secondary | ICD-10-CM

## 2024-05-11 DIAGNOSIS — E1151 Type 2 diabetes mellitus with diabetic peripheral angiopathy without gangrene: Secondary | ICD-10-CM

## 2024-05-11 DIAGNOSIS — G2581 Restless legs syndrome: Secondary | ICD-10-CM | POA: Diagnosis not present

## 2024-05-11 DIAGNOSIS — M79674 Pain in right toe(s): Secondary | ICD-10-CM

## 2024-05-11 DIAGNOSIS — M79675 Pain in left toe(s): Secondary | ICD-10-CM

## 2024-05-11 DIAGNOSIS — E1142 Type 2 diabetes mellitus with diabetic polyneuropathy: Secondary | ICD-10-CM | POA: Diagnosis not present

## 2024-05-11 NOTE — Progress Notes (Signed)
 Subjective:  Patient ID: Joseph [name], male    DOB: 1940-11-02,  MRN: [id number]  DAMONT BALLES presents to clinic today for:  Chief Complaint  Patient presents with   [facility]    [facility] with out callous A1c was in the 7s Plavix  and Elliquis   Patient notes nails are thick, discolored, elongated and painful in shoegear when trying to ambulate.    We did talk about Qutenza but he is not having burning tingling.  He describes mostly a dull or numb sensation so we will hold off on Qutenza option.  He also notes restless legs that he has not discussed with his family doctor.  He often has to ride his stationary bike at night to get his legs to calm down so they will relax and when he lays in bed.  Did inform the patient there is Requip specifically for restless leg syndrome but he states he takes enough pills.  Recommended he speak to his PCP if there are any other options.  PCP is Silvano Angeline FALCON, NP.  Last seen around 04/26/2024  Past Medical History:  Diagnosis Date   Abdominal discomfort    Abdominal discomfort: EGD 2011 showed no significant abnormalities.  Possibly due to metformin .   AKI (acute kidney injury)    Allergic rhinitis    Anticoagulated 05/30/2016   CAD (coronary artery disease)    a. s/p MI in 2004:  stents to the LAD and staged stent RCA, EF 55%;  b.  NSTEMI (1/16):  LHC - mid LAD stent with diff dist restenosis, prox to mid Dx mod dsz jailed by stent, small OM1 99, mCFX 75, dCFX 80, mRCA stent ok, mPDA mild to mod dsz, EF 55% >> PCI:  Synergy DES to LAD; Synergy DES (x2) mid and dist CFX   CAD in native artery 06/12/2019   Chronic kidney disease, stage 2, mildly decreased GFR    stage 2-3   DDD (degenerative disc disease)    L4 to S1 degenerative disease and spondylolisthesis: low back pain.  s/p back surgery   DM (diabetes mellitus) (HCC)    History of CVA (cerebrovascular accident)    Small CVA seen by head CT (incidental) in 1/13.  Carotid dopplers in 1/13 showed  minimal disease   History of Doppler ultrasound    Arterial dopplers (3/11): no evidence for significant PAD. ABIs 10/12: Normal.     HLD (hyperlipidemia)    HTN (hypertension)    had side effects with chlorthalidone   Hx of cardiovascular stress test    Myoview  (1/13): Small fixed apical septal defect with no ischemia, EF 58%.   Hx of echocardiogram    Echo (3/11): EF 60%, normal wall motion, mild MR, mild left atrial enlargement, mildly dilated ascending aorta.   PAF (paroxysmal atrial fibrillation) (HCC) 05/29/2016   Panniculitis    Restless leg syndrome    SBO (small bowel obstruction) (HCC)    Spondylolisthesis    L4-S1   Statin intolerance    Past Surgical History:  Procedure Laterality Date   BIOPSY  01/18/2023   Procedure: BIOPSY;  Surgeon: Wilhelmenia Aloha Raddle., MD;  Location: [facility] ENDOSCOPY;  Service: Gastroenterology;;   COLONOSCOPY N/A 12/26/2017   Procedure: COLONOSCOPY;  Surgeon: Leigh Elspeth SQUIBB, MD;  Location: [facility] ENDOSCOPY;  Service: Gastroenterology;  Laterality: N/A;   COLONOSCOPY W/ POLYPECTOMY  12/2017   CORONARY ANGIOGRAM  11/13/2014   Procedure: CORONARY ANGIOGRAM;  Surgeon: Ezra GORMAN Shuck, MD;  Location: [facility] CATH LAB;  Service: Cardiovascular;;   CORONARY STENT INTERVENTION N/A 05/18/2019   Procedure: CORONARY STENT INTERVENTION;  Surgeon: Anner Alm ORN, MD;  Location: [facility] INVASIVE CV LAB;  Service: Cardiovascular;  Laterality: N/A;   CORONARY STENT PLACEMENT  2004   x2 LAD and RCA    ENDOSCOPIC MUCOSAL RESECTION  06/13/2023   Procedure: ENDOSCOPIC MUCOSAL RESECTION;  Surgeon: Wilhelmenia Aloha Raddle., MD;  Location: [facility] ENDOSCOPY;  Service: Gastroenterology;;   ESOPHAGOGASTRODUODENOSCOPY N/A 01/18/2023   Procedure: ESOPHAGOGASTRODUODENOSCOPY (EGD);  Surgeon: Wilhelmenia Aloha Raddle., MD;  Location: [facility] ENDOSCOPY;  Service: Gastroenterology;  Laterality: N/A;   ESOPHAGOGASTRODUODENOSCOPY (EGD) WITH PROPOFOL  N/A 06/13/2023   Procedure: ESOPHAGOGASTRODUODENOSCOPY  (EGD) WITH PROPOFOL ;  Surgeon: Wilhelmenia Aloha Raddle., MD;  Location: [facility] ENDOSCOPY;  Service: Gastroenterology;  Laterality: N/A;   HEMOSTASIS CLIP PLACEMENT  06/13/2023   Procedure: HEMOSTASIS CLIP PLACEMENT;  Surgeon: Wilhelmenia Aloha Raddle., MD;  Location: [facility] ENDOSCOPY;  Service: Gastroenterology;;   LEFT HEART CATH AND CORONARY ANGIOGRAPHY N/A 05/18/2019   Procedure: LEFT HEART CATH AND CORONARY ANGIOGRAPHY;  Surgeon: Anner Alm ORN, MD;  Location: [facility] INVASIVE CV LAB;  Service: Cardiovascular;  Laterality: N/A;   LEFT HEART CATH AND CORONARY ANGIOGRAPHY N/A 06/11/2019   Procedure: LEFT HEART CATH AND CORONARY ANGIOGRAPHY;  Surgeon: Mady Bruckner, MD;  Location: [facility] INVASIVE CV LAB;  Service: Cardiovascular;  Laterality: N/A;   LEFT HEART CATH AND CORONARY ANGIOGRAPHY N/A 10/27/2020   Procedure: LEFT HEART CATH AND CORONARY ANGIOGRAPHY;  Surgeon: Burnard Debby LABOR, MD;  Location: [facility] INVASIVE CV LAB;  Service: Cardiovascular;  Laterality: N/A;   LEFT HEART CATHETERIZATION WITH CORONARY ANGIOGRAM N/A 08/02/2014   Procedure: LEFT HEART CATHETERIZATION WITH CORONARY ANGIOGRAM;  Surgeon: Candyce GORMAN Reek, MD;  Location: [facility] CATH LAB;  Service: Cardiovascular;  Laterality: N/A;   PROSTATE SURGERY     SPINAL FUSION     L4-S1   SUBMUCOSAL LIFTING INJECTION  06/13/2023   Procedure: SUBMUCOSAL LIFTING INJECTION;  Surgeon: Wilhelmenia Aloha Raddle., MD;  Location: [facility] ENDOSCOPY;  Service: Gastroenterology;;   SUBMUCOSAL TATTOO INJECTION  06/13/2023   Procedure: SUBMUCOSAL TATTOO INJECTION;  Surgeon: Wilhelmenia Aloha Raddle., MD;  Location: [facility] ENDOSCOPY;  Service: Gastroenterology;;   Allergies  Allergen Reactions   Bempedoic Acid  Other (See Comments)    NEXLETOL  Myalgias, GI upset   [name]  [Alirocumab ] Other (See Comments)    Muscle weakness and rash   Rosuvastatin  Other (See Comments)    Low energy/leg and hip pain   Zetia  [Ezetimibe ] Other (See Comments)    myalgias   Hydralazine  Other (See  Comments)    Muscle Aches   Keflex [Cephalexin] Other (See Comments)    Reaction unk    Review of Systems: Negative except as noted in the HPI.  Objective:  DAVEON ARPINO is a pleasant 83 y.o. male in NAD. AAO x 3.  Vascular Examination: Capillary refill time is 3-5 seconds to toes bilateral. Palpable pedal pulses b/l LE. Digital hair present b/l.  Skin temperature gradient WNL b/l. No varicosities b/l. No cyanosis noted b/l.   Dermatological Examination: Pedal skin with normal turgor, texture and tone b/l. No open wounds. No interdigital macerations b/l. Toenails x10 are 3mm thick, discolored, dystrophic with subungual debris. There is pain with compression of the nail plates.  They are elongated x10  Assessment/Plan: 1. Pain due to onychomycosis of toenails of both feet   2. Type II diabetes mellitus with peripheral circulatory disorder (HCC)     The mycotic toenails were sharply debrided  x10 with sterile nail nippers and a power debriding burr to decrease bulk/thickness and length.    As noted above, I did speak to the patient regarding the Qutenza treatments.  This is typically utilized more with painful or burning neuropathy in the feet.  I do not feel he is going to have much benefit if this is used to try to improve sensation/decreased numbness to the feet.  Encouraged the patient to speak to his primary care provider regarding the restless leg syndrome.  Requip is a common medication used to manage the symptoms.  Patient did not seem interested in starting a new oral medication.  There may be other options as well.  In the meantime, continue riding the stationary bike on an as needed basis to calm the legs before going to bed.  Return in about 3 months (around 08/11/2024) for [facility].   Awanda CHARM Imperial, DPM, FACFAS [facility]     [address] [place], [place] [address]                Office [phone]  Fax [phone]

## 2024-05-13 ENCOUNTER — Encounter: Payer: Self-pay | Admitting: Podiatry

## 2024-05-14 ENCOUNTER — Encounter: Payer: Self-pay | Admitting: *Deleted

## 2024-05-16 ENCOUNTER — Ambulatory Visit: Attending: Cardiology | Admitting: Cardiology

## 2024-05-16 ENCOUNTER — Encounter: Payer: Self-pay | Admitting: Cardiology

## 2024-05-16 ENCOUNTER — Encounter: Payer: Self-pay | Admitting: *Deleted

## 2024-05-16 VITALS — BP 140/72 | HR 66 | Ht 74.0 in | Wt 218.0 lb

## 2024-05-16 DIAGNOSIS — Z8673 Personal history of transient ischemic attack (TIA), and cerebral infarction without residual deficits: Secondary | ICD-10-CM | POA: Insufficient documentation

## 2024-05-16 DIAGNOSIS — I739 Peripheral vascular disease, unspecified: Secondary | ICD-10-CM | POA: Diagnosis not present

## 2024-05-16 DIAGNOSIS — E782 Mixed hyperlipidemia: Secondary | ICD-10-CM | POA: Diagnosis present

## 2024-05-16 DIAGNOSIS — R0609 Other forms of dyspnea: Secondary | ICD-10-CM | POA: Diagnosis present

## 2024-05-16 DIAGNOSIS — E1159 Type 2 diabetes mellitus with other circulatory complications: Secondary | ICD-10-CM | POA: Diagnosis present

## 2024-05-16 DIAGNOSIS — I10 Essential (primary) hypertension: Secondary | ICD-10-CM | POA: Diagnosis present

## 2024-05-16 DIAGNOSIS — I2510 Atherosclerotic heart disease of native coronary artery without angina pectoris: Secondary | ICD-10-CM | POA: Insufficient documentation

## 2024-05-16 DIAGNOSIS — I480 Paroxysmal atrial fibrillation: Secondary | ICD-10-CM | POA: Diagnosis not present

## 2024-05-16 DIAGNOSIS — Z9861 Coronary angioplasty status: Secondary | ICD-10-CM | POA: Diagnosis present

## 2024-05-16 NOTE — Patient Instructions (Addendum)
 Medication Instructions:  Your physician recommends that you continue on your current medications as directed. Please refer to the Current Medication list given to you today.  *If you need a refill on your cardiac medications before your next appointment, please call your pharmacy*   Lab Work: BMP, Lipid- today If you have labs (blood work) drawn today and your tests are completely normal, you will receive your results only by: MyChart Message (if you have MyChart) OR A paper copy in the mail If you have any lab test that is abnormal or we need to change your treatment, we will call you to review the results.   Testing/Procedures: Your physician has requested that you have an echocardiogram. Echocardiography is a painless test that uses sound waves to create images of your heart. It provides your doctor with information about the size and shape of your heart and how well your heart's chambers and valves are working. This procedure takes approximately one hour. There are no restrictions for this procedure. Please do NOT wear cologne, perfume, aftershave, or lotions (deodorant is allowed). Please arrive 15 minutes prior to your appointment time.  Please note: We ask at that you not bring children with you during ultrasound (echo/ vascular) testing. Due to room size and safety concerns, children are not allowed in the ultrasound rooms during exams. Our front office staff cannot provide observation of children in our lobby area while testing is being conducted. An adult accompanying a patient to their appointment will only be allowed in the ultrasound room at the discretion of the ultrasound technician under special circumstances. We apologize for any inconvenience.   Your physician has requested that you have a lexiscan  myoview . For further information please visit [email or web address] Please follow instruction sheet, as given.  The test will take approximately 3 to 4 hours to complete; you may  bring reading material.  If someone comes with you to your appointment, they will need to remain in the main lobby due to limited space in the testing area.   How to prepare for your Myocardial Perfusion Test: Do not eat or drink 3 hours prior to your test, except you may have water. Do not consume products containing caffeine (regular or decaffeinated) 12 hours prior to your test. (ex: coffee, chocolate, sodas, tea). Do bring a list of your current medications with you.  If not listed below, you may take your medications as normal. Do wear comfortable clothes (no dresses or overalls) and walking shoes, tennis shoes preferred (No heels or open toe shoes are allowed). Do NOT wear cologne, perfume, aftershave, or lotions (deodorant is allowed). If these instructions are not followed, your test will have to be rescheduled.     Follow-Up: At [facility], you and your health needs are our priority.  As part of our continuing mission to provide you with exceptional heart care, we have created designated Provider Care Teams.  These Care Teams include your primary Cardiologist (physician) and Advanced Practice Providers (APPs -  Physician Assistants and Nurse Practitioners) who all work together to provide you with the care you need, when you need it.  We recommend signing up for the patient portal called MyChart.  Sign up information is provided on this After Visit Summary.  MyChart is used to connect with patients for Virtual Visits (Telemedicine).  Patients are able to view lab/test results, encounter notes, upcoming appointments, etc.  Non-urgent messages can be sent to your provider as well.   To learn more about what you  can do with MyChart, go to [email or web address].    Your next appointment:   6 month(s)  The format for your next appointment:   In Person  Provider:   Lamar Fitch, MD    Other Instructions NA

## 2024-05-16 NOTE — Addendum Note (Signed)
 Addended by: ARLOA PLANAS D on: 05/16/2024 11:02 AM   Modules accepted: Orders

## 2024-05-16 NOTE — Progress Notes (Signed)
 Cardiology Office Note:    Date:  05/16/2024   ID:  Joseph [name], DOB 08-26-1940, MRN [id number]  PCP:  Silvano Angeline FALCON, NP  Cardiologist:  Lamar Fitch, MD    Referring MD: Silvano Angeline FALCON, NP   Chief Complaint  Patient presents with   Annual Exam    History of Present Illness:    Joseph [name] is a 83 y.o. male past medical history significant for coronary disease status post anterior wall myocardial infarction in 2004 treated with stent to LAD with staged PCI to right coronary artery and another non-STEMI in 2016 required 2 drug-eluting stent to mid LAD and drug-eluting stent to mid and distal circumflex artery, also history of paroxysmal atrial fibrillation, anticoagulated, essential hypertension, hyperlipidemia, he is intolerant to statin he is on experimental drug in our research team.  Comes today to months for follow-up overall doing fine she said that she is able to exercise is limited because of back problem in spite of that he goes for 10 to 15 minutes on the bike because before he goes to bed because of restless leg syndrome.  He did not notice any decrease in ability to exercise.  Denies having any typical chest pain tightness squeezing pressure burning chest, no palpitations no dizziness  Past Medical History:  Diagnosis Date   Abdominal discomfort    Abdominal discomfort: EGD 2011 showed no significant abnormalities.  Possibly due to metformin .   AKI (acute kidney injury)    Allergic rhinitis    Anticoagulated 05/30/2016   CAD (coronary artery disease)    a. s/p MI in 2004:  stents to the LAD and staged stent RCA, EF 55%;  b.  NSTEMI (1/16):  LHC - mid LAD stent with diff dist restenosis, prox to mid Dx mod dsz jailed by stent, small OM1 99, mCFX 75, dCFX 80, mRCA stent ok, mPDA mild to mod dsz, EF 55% >> PCI:  Synergy DES to LAD; Synergy DES (x2) mid and dist CFX   CAD in native artery 06/12/2019   Chronic kidney disease, stage 2, mildly decreased GFR    stage  2-3   DDD (degenerative disc disease)    L4 to S1 degenerative disease and spondylolisthesis: low back pain.  s/p back surgery   DM (diabetes mellitus) (HCC)    History of CVA (cerebrovascular accident)    Small CVA seen by head CT (incidental) in 1/13.  Carotid dopplers in 1/13 showed minimal disease   History of Doppler ultrasound    Arterial dopplers (3/11): no evidence for significant PAD. ABIs 10/12: Normal.     HLD (hyperlipidemia)    HTN (hypertension)    had side effects with chlorthalidone   Hx of cardiovascular stress test    Myoview  (1/13): Small fixed apical septal defect with no ischemia, EF 58%.   Hx of echocardiogram    Echo (3/11): EF 60%, normal wall motion, mild MR, mild left atrial enlargement, mildly dilated ascending aorta.   PAF (paroxysmal atrial fibrillation) (HCC) 05/29/2016   Panniculitis    Restless leg syndrome    SBO (small bowel obstruction) (HCC)    Spondylolisthesis    L4-S1   Statin intolerance     Past Surgical History:  Procedure Laterality Date   BIOPSY  01/18/2023   Procedure: BIOPSY;  Surgeon: Wilhelmenia Aloha Raddle., MD;  Location: [facility] ENDOSCOPY;  Service: Gastroenterology;;   COLONOSCOPY N/A 12/26/2017   Procedure: COLONOSCOPY;  Surgeon: Leigh Elspeth SQUIBB, MD;  Location: [facility] ENDOSCOPY;  Service:  Gastroenterology;  Laterality: N/A;   COLONOSCOPY W/ POLYPECTOMY  12/2017   CORONARY ANGIOGRAM  11/13/2014   Procedure: CORONARY ANGIOGRAM;  Surgeon: Ezra GORMAN Shuck, MD;  Location: [facility] CATH LAB;  Service: Cardiovascular;;   CORONARY STENT INTERVENTION N/A 05/18/2019   Procedure: CORONARY STENT INTERVENTION;  Surgeon: Anner Alm ORN, MD;  Location: [facility] INVASIVE CV LAB;  Service: Cardiovascular;  Laterality: N/A;   CORONARY STENT PLACEMENT  2004   x2 LAD and RCA    ENDOSCOPIC MUCOSAL RESECTION  06/13/2023   Procedure: ENDOSCOPIC MUCOSAL RESECTION;  Surgeon: Wilhelmenia Aloha Raddle., MD;  Location: [facility] ENDOSCOPY;  Service: Gastroenterology;;    ESOPHAGOGASTRODUODENOSCOPY N/A 01/18/2023   Procedure: ESOPHAGOGASTRODUODENOSCOPY (EGD);  Surgeon: Wilhelmenia Aloha Raddle., MD;  Location: [facility] ENDOSCOPY;  Service: Gastroenterology;  Laterality: N/A;   ESOPHAGOGASTRODUODENOSCOPY (EGD) WITH PROPOFOL  N/A 06/13/2023   Procedure: ESOPHAGOGASTRODUODENOSCOPY (EGD) WITH PROPOFOL ;  Surgeon: Wilhelmenia Aloha Raddle., MD;  Location: [facility] ENDOSCOPY;  Service: Gastroenterology;  Laterality: N/A;   HEMOSTASIS CLIP PLACEMENT  06/13/2023   Procedure: HEMOSTASIS CLIP PLACEMENT;  Surgeon: Wilhelmenia Aloha Raddle., MD;  Location: [facility] ENDOSCOPY;  Service: Gastroenterology;;   LEFT HEART CATH AND CORONARY ANGIOGRAPHY N/A 05/18/2019   Procedure: LEFT HEART CATH AND CORONARY ANGIOGRAPHY;  Surgeon: Anner Alm ORN, MD;  Location: [facility] INVASIVE CV LAB;  Service: Cardiovascular;  Laterality: N/A;   LEFT HEART CATH AND CORONARY ANGIOGRAPHY N/A 06/11/2019   Procedure: LEFT HEART CATH AND CORONARY ANGIOGRAPHY;  Surgeon: Mady Bruckner, MD;  Location: [facility] INVASIVE CV LAB;  Service: Cardiovascular;  Laterality: N/A;   LEFT HEART CATH AND CORONARY ANGIOGRAPHY N/A 10/27/2020   Procedure: LEFT HEART CATH AND CORONARY ANGIOGRAPHY;  Surgeon: Burnard Debby LABOR, MD;  Location: [facility] INVASIVE CV LAB;  Service: Cardiovascular;  Laterality: N/A;   LEFT HEART CATHETERIZATION WITH CORONARY ANGIOGRAM N/A 08/02/2014   Procedure: LEFT HEART CATHETERIZATION WITH CORONARY ANGIOGRAM;  Surgeon: Candyce GORMAN Reek, MD;  Location: [facility] CATH LAB;  Service: Cardiovascular;  Laterality: N/A;   PROSTATE SURGERY     SPINAL FUSION     L4-S1   SUBMUCOSAL LIFTING INJECTION  06/13/2023   Procedure: SUBMUCOSAL LIFTING INJECTION;  Surgeon: Wilhelmenia Aloha Raddle., MD;  Location: [facility] ENDOSCOPY;  Service: Gastroenterology;;   SUBMUCOSAL TATTOO INJECTION  06/13/2023   Procedure: SUBMUCOSAL TATTOO INJECTION;  Surgeon: Wilhelmenia Aloha Raddle., MD;  Location: [facility] ENDOSCOPY;  Service: Gastroenterology;;    Current  Medications: Current Meds  Medication Sig   acetaminophen  (TYLENOL ) 650 MG CR tablet Take 1 tablet (650 mg total) by mouth every 8 (eight) hours as needed for pain.   amLODipine  (NORVASC ) 5 MG tablet Take 5 mg by mouth daily.   apixaban  (ELIQUIS ) 2.5 MG TABS tablet Take 1 tablet (2.5 mg total) by mouth 2 (two) times daily.   carvedilol  (COREG ) 6.25 MG tablet Take 6.25 mg by mouth 2 (two) times daily with a meal.   Cholecalciferol  (VITAMIN D3) 25 MCG (1000 UT) CAPS Take 1,000 Units by mouth daily.   clopidogrel  (PLAVIX ) 75 MG tablet Take 1 tablet (75 mg total) by mouth daily.   diltiazem  (CARDIZEM ) 30 MG tablet Take 1 tablet (30 mg total) by mouth as needed (Rapid Palpitations).   famotidine  (PEPCID ) 40 MG tablet Take 40 mg by mouth at bedtime.   fish oil -omega-3 fatty acids  1000 MG capsule Take 1 g by mouth at bedtime.   GALZIN 25 MG CAPS Take 1 capsule by mouth daily.   glipiZIDE  (GLUCOTROL ) 10 MG tablet Take 10 mg by mouth 2 (two) times daily.  Insulin  Degludec FlexTouch 100 UNIT/ML SOPN Inject 50 Units into the skin. Takes 46 units daily, adjusted per blood sugar   isosorbide  mononitrate (IMDUR ) 30 MG 24 hr tablet Take 30 mg by mouth every evening.   nitroGLYCERIN  (NITROSTAT ) 0.4 MG SL tablet PLACE 1 TAB UNDER THE TONGUE EVERY 5 MINS AS NEEDED FOR CHEST PAIN FOR UP TO 3 DOSES   omeprazole  (PRILOSEC) 40 MG capsule Take 1 capsule (40 mg total) by mouth 2 (two) times daily before a meal. Take 30 minutes prior to meals   oxymetazoline (AFRIN) 0.05 % nasal spray Place 2 sprays into both nostrils at bedtime.   polyethylene glycol (MIRALAX ) 17 g packet Take 17 g by mouth daily.   sucralfate  (CARAFATE ) 1 g tablet Take 1 tablet (1 g total) by mouth 2 (two) times daily for 14 days.   telmisartan  (MICARDIS ) 20 MG tablet Take 1 tablet (20 mg total) by mouth daily.   triamcinolone cream (KENALOG) 0.1 % Apply 1 Application topically 2 (two) times daily.   Current Facility-Administered Medications for  the 05/16/24 encounter (Office Visit) with Ege Muckey J, MD  Medication   Study - [name] - inclisiran 300 mg/1.35mL or placebo SQ injection (PI-Stuckey)     Allergies:   Bempedoic acid , [name]  [alirocumab ], Rosuvastatin , Zetia  [ezetimibe ], Hydralazine , and Keflex [cephalexin]   Social History   Socioeconomic History   Marital status: Divorced    Spouse name: Not on file   Number of children: 2   Years of education: Not on file   Highest education level: Not on file  Occupational History   Occupation: retired  Tobacco Use   Smoking status: Never   Smokeless tobacco: Never  Vaping Use   Vaping status: Never Used  Substance and Sexual Activity   Alcohol use: No   Drug use: No   Sexual activity: Not Currently  Other Topics Concern   Not on file  Social History Narrative   Not on file   Social Drivers of Health   Financial Resource Strain: Not on file  Food Insecurity: No Food Insecurity (01/16/2023)   Hunger Vital Sign    Worried About Running Out of Food in the Last Year: Never true    Ran Out of Food in the Last Year: Never true  Transportation Needs: No Transportation Needs (01/16/2023)   PRAPARE - Administrator, Civil Service (Medical): No    Lack of Transportation (Non-Medical): No  Physical Activity: Not on file  Stress: Not on file  Social Connections: Not on file     Family History: The patient's family history includes Heart attack in his father and mother. There is no history of Stroke. ROS:   Please see the history of present illness.    All 14 point review of systems negative except as described per history of present illness  EKGs/Labs/Other Studies Reviewed:         Recent Labs: 12/03/2023: B Natriuretic Peptide 57.8; BUN 26; Creatinine, Ser 1.20; Hemoglobin 11.5; Platelets 305; Potassium 4.5; Sodium 133  Recent Lipid Panel    Component Value Date/Time   CHOL 156 01/04/2022 1236   TRIG 184 (H) 01/04/2022 1236   HDL 29 (L)  01/04/2022 1236   CHOLHDL 5.4 (H) 01/04/2022 1236   CHOLHDL 5.4 05/18/2019 0210   VLDL 27 05/18/2019 0210   LDLCALC 95 01/04/2022 1236   LDLDIRECT 83.0 10/28/2014 1622    Physical Exam:    VS:  BP (!) 140/72   Pulse 66  Ht 6' 2 (1.88 m)   Wt 218 lb (98.9 kg)   SpO2 98%   BMI 27.99 kg/m     Wt Readings from Last 3 Encounters:  05/16/24 218 lb (98.9 kg)  02/21/24 222 lb (100.7 kg)  12/27/23 217 lb 1.6 oz (98.5 kg)     GEN:  Well nourished, well developed in no acute distress HEENT: Normal NECK: No JVD; No carotid bruits LYMPHATICS: No lymphadenopathy CARDIAC: RRR, no murmurs, no rubs, no gallops RESPIRATORY:  Clear to auscultation without rales, wheezing or rhonchi  ABDOMEN: Soft, non-tender, non-distended MUSCULOSKELETAL:  No edema; No deformity  SKIN: Warm and dry LOWER EXTREMITIES: no swelling NEUROLOGIC:  Alert and oriented x 3 PSYCHIATRIC:  Normal affect   ASSESSMENT:    1. CAD S/P percutaneous coronary angioplasty   2. Essential hypertension   3. Paroxysmal atrial fibrillation (HCC)   4. Peripheral vascular disease   5. Type 2 diabetes mellitus with vascular disease (HCC)   6. History of CVA (cerebrovascular accident)    PLAN:    In order of problems listed above:  Coronary disease he does have symptoms of dyspnea on exertion which is new he is concerned about it.  He does not remember what current symptoms he had before he stenting before he thinks maybe this is similar.  Will schedule him to have Lexiscan .  I will also do echocardiogram to assess left ventricle ejection fraction. Essential hypertension blood pressure well-controlled continue present management. Paroxysmal atrial fibrillation maintained sinus rhythm, anticoagulation but he is only on 2.5 twice daily Eliquis  which is insufficiently dosed.  Will check Chem-7 if Chem-7 is fine then increase Eliquis  to 5 mg twice daily.  He is more than 83 years old but he definitely weight more than 60  kg. Type 2 diabetes that being followed by primary care physician I do not see any recent fasting lipid profile, I will ask him to have fasting lipid profile done today,   Medication Adjustments/Labs and Tests Ordered: Current medicines are reviewed at length with the patient today.  Concerns regarding medicines are outlined above.  No orders of the defined types were placed in this encounter.  Medication changes: No orders of the defined types were placed in this encounter.   Signed, Lamar DOROTHA Fitch, MD, [facility] 05/16/2024 10:38 AM    [facility]

## 2024-05-17 LAB — BASIC METABOLIC PANEL WITH GFR
BUN/Creatinine Ratio: 28 — ABNORMAL HIGH (ref 10–24)
BUN: 38 mg/dL — ABNORMAL HIGH (ref 8–27)
CO2: 21 mmol/L (ref 20–29)
Calcium: 9.9 mg/dL (ref 8.6–10.2)
Chloride: 100 mmol/L (ref 96–106)
Creatinine, Ser: 1.36 mg/dL — ABNORMAL HIGH (ref 0.76–1.27)
Glucose: 263 mg/dL — ABNORMAL HIGH (ref 70–99)
Potassium: 5.6 mmol/L — ABNORMAL HIGH (ref 3.5–5.2)
Sodium: 137 mmol/L (ref 134–144)
eGFR: 51 mL/min/1.73 — ABNORMAL LOW (ref >59)

## 2024-05-24 ENCOUNTER — Ambulatory Visit: Payer: Self-pay | Admitting: Cardiology

## 2024-05-24 DIAGNOSIS — E875 Hyperkalemia: Secondary | ICD-10-CM

## 2024-05-29 ENCOUNTER — Ambulatory Visit: Attending: Cardiology

## 2024-05-29 DIAGNOSIS — R0609 Other forms of dyspnea: Secondary | ICD-10-CM | POA: Diagnosis not present

## 2024-05-29 MED ORDER — REGADENOSON 0.4 MG/5ML IV SOLN
0.4000 mg | Freq: Once | INTRAVENOUS | Status: AC
  Administered 2024-05-29: 0.4 mg via INTRAVENOUS

## 2024-05-29 MED ORDER — TECHNETIUM TC 99M TETROFOSMIN IV KIT
30.9000 | PACK | Freq: Once | INTRAVENOUS | Status: AC | PRN
  Administered 2024-05-29: 30.9 via INTRAVENOUS

## 2024-05-29 MED ORDER — TECHNETIUM TC 99M TETROFOSMIN IV KIT
10.8000 | PACK | Freq: Once | INTRAVENOUS | Status: AC | PRN
  Administered 2024-05-29: 10.8 via INTRAVENOUS

## 2024-05-30 LAB — MYOCARDIAL PERFUSION IMAGING
LV dias vol: 114 mL (ref 62–150)
LV sys vol: 46 mL (ref 4.2–5.8)
Nuc Stress EF: 59 %
Peak HR: 82 {beats}/min
Rest HR: 59 {beats}/min
Rest Nuclear Isotope Dose: 10.8 mCi
SDS: 1
SRS: 12
SSS: 13
Stress Nuclear Isotope Dose: 30.9 mCi
TID: 1.14

## 2024-06-06 NOTE — Telephone Encounter (Signed)
 Patient was called and made aware of elevated potassium and recommendation to have blood work redrawn. Order was placed for repeat blood work. Patient states will have it drawn tomorrow, 11/20 in the office when he comes in for testing. Also discussed results of stress test with patient. Patient denies any questions/concerns at present.  Alan, RN

## 2024-06-07 ENCOUNTER — Ambulatory Visit: Attending: Cardiology

## 2024-06-07 DIAGNOSIS — R0609 Other forms of dyspnea: Secondary | ICD-10-CM | POA: Diagnosis present

## 2024-06-07 LAB — ECHOCARDIOGRAM COMPLETE
AR max vel: 2.62 cm2
AV Area VTI: 2.43 cm2
AV Area mean vel: 2.42 cm2
AV Mean grad: 5 mmHg
AV Peak grad: 8.8 mmHg
Ao pk vel: 1.49 m/s
Area-P 1/2: 4.79 cm2
S' Lateral: 3 cm

## 2024-06-08 LAB — BASIC METABOLIC PANEL WITH GFR
BUN/Creatinine Ratio: 26 — ABNORMAL HIGH (ref 10–24)
BUN: 32 mg/dL — ABNORMAL HIGH (ref 8–27)
CO2: 21 mmol/L (ref 20–29)
Calcium: 9.5 mg/dL (ref 8.6–10.2)
Chloride: 103 mmol/L (ref 96–106)
Creatinine, Ser: 1.24 mg/dL (ref 0.76–1.27)
Glucose: 196 mg/dL — ABNORMAL HIGH (ref 70–99)
Potassium: 5.3 mmol/L — ABNORMAL HIGH (ref 3.5–5.2)
Sodium: 139 mmol/L (ref 134–144)
eGFR: 57 mL/min/1.73 — ABNORMAL LOW (ref >59)

## 2024-06-19 ENCOUNTER — Encounter: Admitting: *Deleted

## 2024-06-19 VITALS — BP 135/74 | HR 68 | Temp 98.4°F | Resp 16 | Wt 210.0 lb

## 2024-06-19 DIAGNOSIS — Z006 Encounter for examination for normal comparison and control in clinical research program: Secondary | ICD-10-CM

## 2024-06-19 MED ORDER — STUDY - ORION 4 - INCLISIRAN 300 MG/1.5 ML OR PLACEBO SQ INJECTION (PI-STUCKEY)
300.0000 mg | INJECTION | SUBCUTANEOUS | Status: AC
  Administered 2024-06-19: 300 mg via SUBCUTANEOUS
  Filled 2024-06-19: qty 1.5

## 2024-06-19 NOTE — Research (Signed)
 [name]   Patient doing well, no complaints of chest pain or shortness of breath.  Will see patient back in June 2026.  IP given with no side effects   Current Outpatient Medications:    acetaminophen  (TYLENOL ) 650 MG CR tablet, Take 1 tablet (650 mg total) by mouth every 8 (eight) hours as needed for pain., Disp: , Rfl:    amLODipine  (NORVASC ) 5 MG tablet, Take 5 mg by mouth daily., Disp: , Rfl:    apixaban  (ELIQUIS ) 2.5 MG TABS tablet, Take 1 tablet (2.5 mg total) by mouth 2 (two) times daily., Disp: , Rfl:    carvedilol  (COREG ) 6.25 MG tablet, Take 6.25 mg by mouth 2 (two) times daily with a meal., Disp: , Rfl:    Cholecalciferol  (VITAMIN D3) 25 MCG (1000 UT) CAPS, Take 1,000 Units by mouth daily., Disp: , Rfl:    clopidogrel  (PLAVIX ) 75 MG tablet, Take 1 tablet (75 mg total) by mouth daily., Disp: , Rfl:    diltiazem  (CARDIZEM ) 30 MG tablet, Take 1 tablet (30 mg total) by mouth as needed (Rapid Palpitations)., Disp: 30 tablet, Rfl: 5   famotidine  (PEPCID ) 40 MG tablet, Take 40 mg by mouth at bedtime., Disp: , Rfl:    GALZIN 25 MG CAPS, Take 1 capsule by mouth daily., Disp: , Rfl:    glipiZIDE  (GLUCOTROL ) 10 MG tablet, Take 10 mg by mouth 2 (two) times daily., Disp: , Rfl:    Insulin  Degludec FlexTouch 100 UNIT/ML SOPN, Inject 50 Units into the skin. Takes 46 units daily, adjusted per blood sugar, Disp: , Rfl:    isosorbide  mononitrate (IMDUR ) 30 MG 24 hr tablet, Take 30 mg by mouth every evening., Disp: , Rfl:    nitroGLYCERIN  (NITROSTAT ) 0.4 MG SL tablet, PLACE 1 TAB UNDER THE TONGUE EVERY 5 MINS AS NEEDED FOR CHEST PAIN FOR UP TO 3 DOSES, Disp: 25 tablet, Rfl: 3   omeprazole  (PRILOSEC) 40 MG capsule, Take 1 capsule (40 mg total) by mouth 2 (two) times daily before a meal. Take 30 minutes prior to meals, Disp: 60 capsule, Rfl: 6   oxymetazoline (AFRIN) 0.05 % nasal spray, Place 2 sprays into both nostrils at bedtime., Disp: , Rfl:    polyethylene glycol (MIRALAX ) 17 g packet, Take 17 g by  mouth daily., Disp: 14 each, Rfl: 0   sucralfate  (CARAFATE ) 1 g tablet, Take 1 tablet (1 g total) by mouth 2 (two) times daily for 14 days., Disp: 28 tablet, Rfl: 0   telmisartan  (MICARDIS ) 20 MG tablet, Take 1 tablet (20 mg total) by mouth daily., Disp: 30 tablet, Rfl: 0   triamcinolone cream (KENALOG) 0.1 %, Apply 1 Application topically 2 (two) times daily., Disp: , Rfl:    fish oil -omega-3 fatty acids  1000 MG capsule, Take 1 g by mouth at bedtime. (Patient not taking: Reported on 06/19/2024), Disp: , Rfl:   Current Facility-Administered Medications:    Study - [name] - inclisiran 300 mg/1.5mL or placebo SQ injection (PI-Stuckey), 300 mg, Subcutaneous, Q6 months, , 300 mg at 12/19/23 9061   Study - [name] - inclisiran 300 mg/1.5mL or placebo SQ injection (PI-Stuckey), 300 mg, Subcutaneous, Q6 months, , 300 mg at 06/19/24 [phone]

## 2024-07-04 MED ORDER — TELMISARTAN 20 MG PO TABS: 10.0000 mg | ORAL_TABLET | Freq: Every day | ORAL | Status: AC

## 2024-07-04 NOTE — Telephone Encounter (Signed)
 Letter mailed Ordered placed

## 2024-07-04 NOTE — Addendum Note (Signed)
 Addended by: ONEITA BERLINER on: 07/04/2024 02:54 PM   Modules accepted: Orders

## 2024-07-04 NOTE — Telephone Encounter (Signed)
-----   Message from Lamar Fitch, MD sent at 06/13/2024 12:46 PM EST ----- Potassium still elevated, please cut down telmisartan  to only half a tablet daily Chem-7 need to be done within a week ----- Message ----- From: Interface, [facility] Lab Results In Sent: 06/08/2024   5:39 AM EST To: Lamar JINNY Fitch, MD

## 2024-07-25 ENCOUNTER — Other Ambulatory Visit: Payer: Self-pay | Admitting: Thoracic Surgery (Cardiothoracic Vascular Surgery)

## 2024-07-25 DIAGNOSIS — R918 Other nonspecific abnormal finding of lung field: Secondary | ICD-10-CM

## 2024-08-01 ENCOUNTER — Ambulatory Visit: Admitting: Podiatry

## 2024-08-01 DIAGNOSIS — L84 Corns and callosities: Secondary | ICD-10-CM

## 2024-08-01 DIAGNOSIS — B351 Tinea unguium: Secondary | ICD-10-CM

## 2024-08-01 DIAGNOSIS — E1151 Type 2 diabetes mellitus with diabetic peripheral angiopathy without gangrene: Secondary | ICD-10-CM

## 2024-08-01 NOTE — Progress Notes (Signed)
 "      Subjective:  Patient ID: Joseph [name], male    DOB: 05-15-1941,  MRN: [id number]  Joseph [name] presents to clinic today for:  Chief Complaint  Patient presents with   [facility]    [facility] with callus on the right lateral side at 5th toe.  A1c was 8.6 in Dec [place]   Patient notes nails are thick, discolored, elongated and painful in shoegear when trying to ambulate.  He has a painful callus on the plantar aspect of the right forefoot  PCP is Silvano Angeline FALCON, NP.  Last seen around 05/24/2024  Past Medical History:  Diagnosis Date   Abdominal discomfort    Abdominal discomfort: EGD 2011 showed no significant abnormalities.  Possibly due to metformin .   AKI (acute kidney injury)    Allergic rhinitis    Anticoagulated 05/30/2016   CAD (coronary artery disease)    a. s/p MI in 2004:  stents to the LAD and staged stent RCA, EF 55%;  b.  NSTEMI (1/16):  LHC - mid LAD stent with diff dist restenosis, prox to mid Dx mod dsz jailed by stent, small OM1 99, mCFX 75, dCFX 80, mRCA stent ok, mPDA mild to mod dsz, EF 55% >> PCI:  Synergy DES to LAD; Synergy DES (x2) mid and dist CFX   CAD in native artery 06/12/2019   Chronic kidney disease, stage 2, mildly decreased GFR    stage 2-3   DDD (degenerative disc disease)    L4 to S1 degenerative disease and spondylolisthesis: low back pain.  s/p back surgery   DM (diabetes mellitus) (HCC)    History of CVA (cerebrovascular accident)    Small CVA seen by head CT (incidental) in 1/13.  Carotid dopplers in 1/13 showed minimal disease   History of Doppler ultrasound    Arterial dopplers (3/11): no evidence for significant PAD. ABIs 10/12: Normal.     HLD (hyperlipidemia)    HTN (hypertension)    had side effects with chlorthalidone   Hx of cardiovascular stress test    Myoview  (1/13): Small fixed apical septal defect with no ischemia, EF 58%.   Hx of echocardiogram    Echo (3/11): EF 60%, normal wall motion, mild MR, mild left atrial enlargement,  mildly dilated ascending aorta.   PAF (paroxysmal atrial fibrillation) (HCC) 05/29/2016   Panniculitis    Restless leg syndrome    SBO (small bowel obstruction) (HCC)    Spondylolisthesis    L4-S1   Statin intolerance    Past Surgical History:  Procedure Laterality Date   BIOPSY  01/18/2023   Procedure: BIOPSY;  Surgeon: Wilhelmenia Aloha Raddle., MD;  Location: [facility] ENDOSCOPY;  Service: Gastroenterology;;   COLONOSCOPY N/A 12/26/2017   Procedure: COLONOSCOPY;  Surgeon: Leigh Elspeth SQUIBB, MD;  Location: [facility] ENDOSCOPY;  Service: Gastroenterology;  Laterality: N/A;   COLONOSCOPY W/ POLYPECTOMY  12/2017   CORONARY ANGIOGRAM  11/13/2014   Procedure: CORONARY ANGIOGRAM;  Surgeon: Ezra GORMAN Shuck, MD;  Location: [facility] CATH LAB;  Service: Cardiovascular;;   CORONARY STENT INTERVENTION N/A 05/18/2019   Procedure: CORONARY STENT INTERVENTION;  Surgeon: Anner Alm LELON, MD;  Location: [facility] INVASIVE CV LAB;  Service: Cardiovascular;  Laterality: N/A;   CORONARY STENT PLACEMENT  2004   x2 LAD and RCA    ENDOSCOPIC MUCOSAL RESECTION  06/13/2023   Procedure: ENDOSCOPIC MUCOSAL RESECTION;  Surgeon: Wilhelmenia Aloha Raddle., MD;  Location: [facility] ENDOSCOPY;  Service: Gastroenterology;;   ESOPHAGOGASTRODUODENOSCOPY N/A 01/18/2023   Procedure: ESOPHAGOGASTRODUODENOSCOPY (  EGD);  Surgeon: Mansouraty, Aloha Raddle., MD;  Location: [facility] ENDOSCOPY;  Service: Gastroenterology;  Laterality: N/A;   ESOPHAGOGASTRODUODENOSCOPY (EGD) WITH PROPOFOL  N/A 06/13/2023   Procedure: ESOPHAGOGASTRODUODENOSCOPY (EGD) WITH PROPOFOL ;  Surgeon: Wilhelmenia Aloha Raddle., MD;  Location: [facility] ENDOSCOPY;  Service: Gastroenterology;  Laterality: N/A;   HEMOSTASIS CLIP PLACEMENT  06/13/2023   Procedure: HEMOSTASIS CLIP PLACEMENT;  Surgeon: Wilhelmenia Aloha Raddle., MD;  Location: [facility] ENDOSCOPY;  Service: Gastroenterology;;   LEFT HEART CATH AND CORONARY ANGIOGRAPHY N/A 05/18/2019   Procedure: LEFT HEART CATH AND CORONARY ANGIOGRAPHY;  Surgeon: Anner Alm ORN, MD;  Location: [facility] INVASIVE CV LAB;  Service: Cardiovascular;  Laterality: N/A;   LEFT HEART CATH AND CORONARY ANGIOGRAPHY N/A 06/11/2019   Procedure: LEFT HEART CATH AND CORONARY ANGIOGRAPHY;  Surgeon: Mady Bruckner, MD;  Location: [facility] INVASIVE CV LAB;  Service: Cardiovascular;  Laterality: N/A;   LEFT HEART CATH AND CORONARY ANGIOGRAPHY N/A 10/27/2020   Procedure: LEFT HEART CATH AND CORONARY ANGIOGRAPHY;  Surgeon: Burnard Debby LABOR, MD;  Location: [facility] INVASIVE CV LAB;  Service: Cardiovascular;  Laterality: N/A;   LEFT HEART CATHETERIZATION WITH CORONARY ANGIOGRAM N/A 08/02/2014   Procedure: LEFT HEART CATHETERIZATION WITH CORONARY ANGIOGRAM;  Surgeon: Candyce GORMAN Reek, MD;  Location: [facility] CATH LAB;  Service: Cardiovascular;  Laterality: N/A;   PROSTATE SURGERY     SPINAL FUSION     L4-S1   SUBMUCOSAL LIFTING INJECTION  06/13/2023   Procedure: SUBMUCOSAL LIFTING INJECTION;  Surgeon: Wilhelmenia Aloha Raddle., MD;  Location: [facility] ENDOSCOPY;  Service: Gastroenterology;;   SUBMUCOSAL TATTOO INJECTION  06/13/2023   Procedure: SUBMUCOSAL TATTOO INJECTION;  Surgeon: Wilhelmenia Aloha Raddle., MD;  Location: [facility] ENDOSCOPY;  Service: Gastroenterology;;   Allergies  Allergen Reactions   Bempedoic Acid  Other (See Comments)    NEXLETOL  Myalgias, GI upset   [name]  [Alirocumab ] Other (See Comments)    Muscle weakness and rash   Rosuvastatin  Other (See Comments)    Low energy/leg and hip pain   Zetia  [Ezetimibe ] Other (See Comments)    myalgias   Hydralazine  Other (See Comments)    Muscle Aches   Keflex [Cephalexin] Other (See Comments)    Reaction unk    Review of Systems: Negative except as noted in the HPI.  Objective:  Joseph [name] is a pleasant 84 y.o. male in NAD. AAO x 3.  Vascular Examination: Capillary refill time is 3-5 seconds to toes bilateral.  2/4 DP, 0/4 PT palpable pedal pulses b/l LE. Digital hair absent b/l.  Skin temperature gradient WNL b/l. No varicosities b/l. No cyanosis  noted b/l.   Dermatological Examination: Pedal skin with decreased turgor, texture and tone b/l. No open wounds. No interdigital macerations b/l. Toenails x10 are 3mm thick, discolored, dystrophic with subungual debris. There is pain with compression of the nail plates.  They are elongated x10.  There is a hyperkeratotic lesion right submet 5  Assessment/Plan: 1. Pain due to onychomycosis of toenails of both feet     The mycotic toenails were sharply debrided x10 with sterile nail nippers and a power debriding burr to decrease bulk/thickness and length.    The hyperkeratotic lesion right submet 5 were shaved with a sterile #[id number] blade uneventfully.  Return in about 3 months (around 10/30/2024) for [facility].   Joseph [name], DPM, FACFAS [facility]     [address]  [place], [place] [address]                Office [phone]  Fax [phone]   "

## 2024-08-14 ENCOUNTER — Ambulatory Visit (HOSPITAL_COMMUNITY)

## 2024-08-21 ENCOUNTER — Ambulatory Visit (HOSPITAL_COMMUNITY)

## 2024-08-24 ENCOUNTER — Ambulatory Visit (HOSPITAL_COMMUNITY)
Admission: RE | Admit: 2024-08-24 | Attending: Thoracic Surgery (Cardiothoracic Vascular Surgery) | Admitting: Thoracic Surgery (Cardiothoracic Vascular Surgery)

## 2024-08-24 DIAGNOSIS — R918 Other nonspecific abnormal finding of lung field: Secondary | ICD-10-CM

## 2024-08-28 ENCOUNTER — Ambulatory Visit: Admitting: Thoracic Surgery (Cardiothoracic Vascular Surgery)

## 2024-11-01 ENCOUNTER — Ambulatory Visit: Admitting: Podiatry

## 2024-12-19 ENCOUNTER — Encounter
